# Patient Record
Sex: Female | Born: 1980 | Race: Black or African American | Hispanic: No | Marital: Single | Smoking: Current every day smoker
Health system: Southern US, Community
[De-identification: ages and names within clinical notes are randomized; demographics above are authoritative.]

## PROBLEM LIST (undated history)

## (undated) DIAGNOSIS — K745 Biliary cirrhosis, unspecified: Secondary | ICD-10-CM

## (undated) DIAGNOSIS — L0291 Cutaneous abscess, unspecified: Secondary | ICD-10-CM

## (undated) DIAGNOSIS — F32A Depression, unspecified: Secondary | ICD-10-CM

## (undated) DIAGNOSIS — F329 Major depressive disorder, single episode, unspecified: Secondary | ICD-10-CM

## (undated) DIAGNOSIS — F419 Anxiety disorder, unspecified: Secondary | ICD-10-CM

## (undated) DIAGNOSIS — K7460 Unspecified cirrhosis of liver: Secondary | ICD-10-CM

## (undated) HISTORY — DX: Cutaneous abscess, unspecified: L02.91

## (undated) HISTORY — PX: TUBAL LIGATION: SHX77

## (undated) HISTORY — DX: Unspecified cirrhosis of liver: K74.60

---

## 2001-03-03 ENCOUNTER — Inpatient Hospital Stay (HOSPITAL_COMMUNITY): Admission: RE | Admit: 2001-03-03 | Discharge: 2001-03-06 | Payer: Self-pay | Admitting: Obstetrics and Gynecology

## 2001-04-14 ENCOUNTER — Observation Stay (HOSPITAL_COMMUNITY): Admission: EM | Admit: 2001-04-14 | Discharge: 2001-04-15 | Payer: Self-pay | Admitting: Internal Medicine

## 2001-04-16 ENCOUNTER — Encounter (HOSPITAL_COMMUNITY): Admission: RE | Admit: 2001-04-16 | Discharge: 2001-05-16 | Payer: Self-pay | Admitting: General Surgery

## 2001-07-18 ENCOUNTER — Emergency Department (HOSPITAL_COMMUNITY): Admission: EM | Admit: 2001-07-18 | Discharge: 2001-07-18 | Payer: Self-pay | Admitting: *Deleted

## 2002-09-06 ENCOUNTER — Emergency Department (HOSPITAL_COMMUNITY): Admission: EM | Admit: 2002-09-06 | Discharge: 2002-09-06 | Payer: Self-pay | Admitting: Emergency Medicine

## 2003-05-29 ENCOUNTER — Ambulatory Visit (HOSPITAL_COMMUNITY): Admission: RE | Admit: 2003-05-29 | Discharge: 2003-05-29 | Payer: Self-pay | Admitting: *Deleted

## 2003-09-27 ENCOUNTER — Other Ambulatory Visit: Admission: RE | Admit: 2003-09-27 | Discharge: 2003-09-27 | Payer: Self-pay | Admitting: *Deleted

## 2004-01-19 ENCOUNTER — Emergency Department (HOSPITAL_COMMUNITY): Admission: EM | Admit: 2004-01-19 | Discharge: 2004-01-19 | Payer: Self-pay | Admitting: Emergency Medicine

## 2004-02-29 ENCOUNTER — Encounter (HOSPITAL_COMMUNITY): Admission: RE | Admit: 2004-02-29 | Discharge: 2004-03-30 | Payer: Self-pay | Admitting: Family Medicine

## 2005-01-09 ENCOUNTER — Emergency Department (HOSPITAL_COMMUNITY): Admission: EM | Admit: 2005-01-09 | Discharge: 2005-01-09 | Payer: Self-pay | Admitting: Emergency Medicine

## 2005-02-05 ENCOUNTER — Emergency Department (HOSPITAL_COMMUNITY): Admission: EM | Admit: 2005-02-05 | Discharge: 2005-02-05 | Payer: Self-pay | Admitting: Emergency Medicine

## 2005-09-12 ENCOUNTER — Emergency Department (HOSPITAL_COMMUNITY): Admission: EM | Admit: 2005-09-12 | Discharge: 2005-09-12 | Payer: Self-pay | Admitting: Emergency Medicine

## 2005-12-01 ENCOUNTER — Emergency Department (HOSPITAL_COMMUNITY): Admission: EM | Admit: 2005-12-01 | Discharge: 2005-12-01 | Payer: Self-pay | Admitting: Emergency Medicine

## 2005-12-06 ENCOUNTER — Emergency Department (HOSPITAL_COMMUNITY): Admission: EM | Admit: 2005-12-06 | Discharge: 2005-12-06 | Payer: Self-pay | Admitting: Emergency Medicine

## 2005-12-08 ENCOUNTER — Observation Stay (HOSPITAL_COMMUNITY): Admission: RE | Admit: 2005-12-08 | Discharge: 2005-12-09 | Payer: Self-pay | Admitting: General Surgery

## 2005-12-08 ENCOUNTER — Encounter (INDEPENDENT_AMBULATORY_CARE_PROVIDER_SITE_OTHER): Payer: Self-pay | Admitting: General Surgery

## 2006-04-29 ENCOUNTER — Emergency Department (HOSPITAL_COMMUNITY): Admission: EM | Admit: 2006-04-29 | Discharge: 2006-04-30 | Payer: Self-pay | Admitting: Emergency Medicine

## 2006-05-09 ENCOUNTER — Emergency Department (HOSPITAL_COMMUNITY): Admission: EM | Admit: 2006-05-09 | Discharge: 2006-05-09 | Payer: Self-pay | Admitting: Emergency Medicine

## 2006-08-02 ENCOUNTER — Emergency Department (HOSPITAL_COMMUNITY): Admission: EM | Admit: 2006-08-02 | Discharge: 2006-08-02 | Payer: Self-pay | Admitting: *Deleted

## 2009-05-01 ENCOUNTER — Emergency Department (HOSPITAL_COMMUNITY): Admission: EM | Admit: 2009-05-01 | Discharge: 2009-05-01 | Payer: Self-pay | Admitting: Emergency Medicine

## 2009-08-03 ENCOUNTER — Emergency Department (HOSPITAL_COMMUNITY): Admission: EM | Admit: 2009-08-03 | Discharge: 2009-08-03 | Payer: Self-pay | Admitting: Emergency Medicine

## 2009-08-06 ENCOUNTER — Emergency Department (HOSPITAL_COMMUNITY): Admission: EM | Admit: 2009-08-06 | Discharge: 2009-08-06 | Payer: Self-pay | Admitting: Emergency Medicine

## 2010-11-23 ENCOUNTER — Emergency Department (HOSPITAL_COMMUNITY)
Admission: EM | Admit: 2010-11-23 | Discharge: 2010-11-23 | Payer: Self-pay | Source: Home / Self Care | Admitting: Emergency Medicine

## 2011-04-03 NOTE — Op Note (Signed)
NAMENABRIA, NEVIN               ACCOUNT NO.:  [id number]   MEDICAL RECORD NO.:  [id number]          PATIENT TYPE:  OBV   LOCATION:  A322                          FACILITY:  [facility]   PHYSICIAN:  Jerolyn Shin C. Katrinka Blazing, M.D.   DATE OF BIRTH:  [birth year]   DATE OF PROCEDURE:  12/08/2005  DATE OF DISCHARGE:                                 OPERATIVE REPORT   PREOPERATIVE DIAGNOSIS:  Pilonidal abscess.   POSTOPERATIVE DIAGNOSIS:  Pilonidal abscess.   PROCEDURE:  Wide excision of pilonidal abscess with debridement.   SURGEON:  Dirk Dress. Katrinka Blazing, M.D.   DESCRIPTION OF PROCEDURE:  Under spinal anesthesia the patient was turned to  the prone position. The presacral area and lower back were prepped and  draped in a sterile field.  The large abscess cavity in the upper gluteal  crease was identified.  A wide excision around the abscess cavity was  carried out.  Even with wide excision we were still unable to get all the  way around the cavity.  Cultures of the purulent material in the cavity were  taken. The skin and subcutaneous tissue was excised.  All the necrotic  debris was excised back to normal appearing tissue. Hemostasis was achieved.  The area was packed with iodoform gauze.  Sterile dressing was placed.  The  patient tolerated the procedure well.  She was turned to a stretcher and  taken to the postanesthetic care unit in satisfactory condition.      Dirk Dress. Katrinka Blazing, M.D.  Electronically Signed     LCS/MEDQ  D:  12/08/2005  T:  12/09/2005  Job:  [id number]

## 2011-04-03 NOTE — H&P (Signed)
Mallory Mendoza, Mallory Mendoza               ACCOUNT NO.:  [id number]   MEDICAL RECORD NO.:  [id number]          PATIENT TYPE:  AMB   LOCATION:  DAY                           FACILITY:  [facility]   PHYSICIAN:  Leroy C. Katrinka Blazing, M.D.   DATE OF BIRTH:  1981/04/22   DATE OF ADMISSION:  DATE OF DISCHARGE:  LH                                HISTORY & PHYSICAL   A 30 year old female with a history of enlarged inflammatory mass of the  left buttocks near the central crease in the presacral region, suspicious  for large pilonidal abscess.  She has been on antibiotics for 10 days  without improvement.  She is admitted through day surgery for wide excision  of the area.   PAST MEDICAL HISTORY:  She has no other medical problems.  She did have I&D  of pilonidal abscess in the past.  Other surgery includes C section x3 and  tubal ligation.   ALLERGIES:  None.   PRESENT MEDICATIONS:  Septra DS twice daily.   PHYSICAL EXAMINATION:  VITAL SIGNS:  Blood pressure 122/84, pulse 88,  respirations 20.  Weight 164 pounds.  HEENT:  Unremarkable.  NECK:  Supple with no JVD, bruit, adenopathy, or thyromegaly.  CHEST: Clear except for a few scattered rhonchi. No wheezes, no rales.  HEART:  Regular rate and rhythm without murmur, gallop, or rub.  ABDOMEN:  Soft, nontender.  No masses.  RECTAL:  Reveals a large inflammatory mass of the left buttocks near the  central crease in the presacral region with extension across the midline and  superiorly.  There is inflammation around the indurated area.  It does not  extend to the posterior rectal space.  EXTREMITIES:  No cyanosis, clubbing, or edema.  NEUROLOGIC:  No focal deficits.   IMPRESSION:  Inflammatory mass of presacral area, suspected pilonidal  abscess.   PLAN:  Excision under anesthesia.      Dirk Dress. Katrinka Blazing, M.D.  Electronically Signed     LCS/MEDQ  D:  12/07/2005  T:  12/07/2005  Job:  [id number]

## 2011-04-03 NOTE — Op Note (Signed)
Mallory Mendoza, Mallory Mendoza                         ACCOUNT NO.:  [id number]   MEDICAL RECORD NO.:  [id number]                   PATIENT TYPE:  AMB   LOCATION:  DAY                                  FACILITY:  [facility]   PHYSICIAN:  Langley Gauss, M.D.                DATE OF BIRTH:  1981-04-17   DATE OF PROCEDURE:  05/29/2003  DATE OF DISCHARGE:                                 OPERATIVE REPORT   PREOPERATIVE DIAGNOSIS:  Multiparity, desires permanent sterilization.   POSTOPERATIVE DIAGNOSIS:  Multiparity, desires permanent sterilization.   PROCEDURE:  Laparoscopic tubal ligation utilizing bipolar cautery.   SURGEON:  Roylene Reason. Lisette Grinder, M.D.   SPECIMENS:  None.   ANESTHESIA:  General endotracheal   FINDINGS:  Findings at the time of surgery include a normal size uterus.  Ovaries noted to be normal in appearance.  Tubes noted to be normal in  appearance. There was noted to be a dense band of adhesions from the  midportion of the uterus anteriorly to the anterior abdominal wall.   SUMMARY:  The patient was taken to the operating room and vital signs were  stable.  The patient underwent uncomplicated induction of general  endotracheal anesthesia; then placed in the low lithotomy position and  prepped and draped in the usual sterile manner.  A speculum was placed  within the vaginal vault.  The cervix was visualized and noted to be without  lesions.  The anterior lip was grasped with a single-tooth tenaculum.  A  Hulka intrauterine manipulator was then used to pass through the  endocervical os and grasp the cervix at 12 o'clock.   Instruments are then removed.  I changed to sterile gloves; standing at the  patient's left side, a 1 cm vertical incision was made just inferior to the  umbilicus.  Likewise a suprapubic horizontal incision made under.  The  abdominal wall was then elevated.  The Veress needle was then passed through  the subumbilical incision angling towards the hollow of the  sacrum while  perpendicular to the fascial plane.  This was done atraumatically.  A drop  test then confirms a proper intraperitoneal placement.  Pneumoperitoneum is  then created by insufflating 3 L of CO2 gas at a filling pressure of less  than 10 mmHg.  The Veress needle is removed and the abdominal wall was then  elevated.  A 10-mm trocar, clear type, disposable is then inserted through  the subumbilical incision angling towards the hollow of the sacrum.  This  was done atraumatically.  The trocar was removed; scope was inserted; which  confirms a proper intraperitoneal placement with no apparent bowel or  vascular injury.   Under direct visualization a 5-mm trocar is inserted suprapubically, in the  midline, under direct visualization. This then allows pelvic contents to be  noted as previously described.  The uterus is then elevated.  Each of the  fallopian  tubes is verified by tracing them out to their fifth intercostal  space, midclavicular line ends.  Each of the fallopian tubes is then  cauterized as follows:  A total of 3 cauterizations are performed on each  side using the Kleppinger bipolar cauterization forceps with the most  proximal cauterization being 1 cm from the tubal uterine junction. Each of  the cauterizations is in direct continuity with the previous which results  in destruction of 3-4 cm of fallopian tube on each side.  There were no  bowel or vascular injuries occurring.   The uterus was then pushed posteriorly which allows visualization of the  adhesive band to the anterior abdominal wall.  We do not have any operative  supplies ready, at this time.  I did attempt to remove these adhesions with  the bipolar forceps, cauterizing and tearing; however, this was  unsuccessful.  Additional trocar sites as well as operative equipment would  be required to completely excise these incisions.  Thus, after the tubal  ligation is performed the procedure is terminated.   The instruments are  removed and gas is allowed to escape.   Our incision sites are closed utilizing a 2-0 Vicryl through-and-through  Scarpa's fascia followed by a 3-0 Nylon suture in a horizontal mattress  fashion to reapproximate the skin edges.  The patient tolerated the  procedure very well.  She is extubated and taken to the recovery room in  stable condition.  No family members are available to discuss operative  findings.  The patient states that they will be notified for pick up.                                                Langley Gauss, M.D.    DC/MEDQ  D:  05/29/2003  T:  05/29/2003  Job:  [id number] [id number]

## 2011-04-03 NOTE — Discharge Summary (Signed)
Mallory Mendoza, Mallory Mendoza               ACCOUNT NO.:  [id number]   MEDICAL RECORD NO.:  [id number]          PATIENT TYPE:  EMS   LOCATION:  ED                            FACILITY:  [facility]   PHYSICIAN:  Sheppard Penton. Stacie Acres, M.D.  DATE OF BIRTH:  1981/08/20   DATE OF ADMISSION:  08/02/2006  DATE OF DISCHARGE:  08/02/2006                                 DISCHARGE SUMMARY   CHIEF COMPLAINT:  Plantar wart.   HISTORY OF PRESENT ILLNESS:  This is a 30 year old black female who has had  a plantar wart on her foot for about a year causing increased pain.  She had  a similar wart previously, went to [facility] and had it removed.  She has no other complaints.   PAST MEDICAL HISTORY:  No known cardiopulmonary or gastrointestinal disease.   SOCIAL HISTORY:  Nonsmoker, no drug abuse.  Occasional drinker.   PAST SURGICAL HISTORY:  Bilateral tubal ligation.   FAMILY HISTORY:  Noncontributory.   REVIEW OF SYSTEMS:  Other than above also is negative.   PHYSICAL EXAMINATION:  VITAL SIGNS:  Reviewed.  GENERAL APPEARANCE:  File is reviewed and normal.  She is awake, alert,  cooperative in room 1.  Memory, affect, judgment appropriate for age.  FOCUS EXAMINATION:  Shows an approximately 2 cm plantar wart on the plantar  surface of her right foot.  Minimally tender to palpation.  No signs of  infection.  No symptoms in the arch.  No symptoms in the dorsum of the foot.  No symptoms in the leg.   Discussed with the patient that she could use over the counter Dr. Margart Sickles  plantar wart remedies and/or followup with [facility] for removal  of recurring wart.  She is agreeable to the plan.   DIAGNOSIS:  Recurrent plantar wart.   DISCHARGE INSTRUCTIONS:  Instructions given for followup.           ______________________________  Sheppard Penton. Stacie Acres, M.D.     NMM/MEDQ  D:  08/02/2006  T:  08/02/2006  Job:  [id number]

## 2011-04-03 NOTE — Discharge Summary (Signed)
   NAMEDEREK, HUNEYCUTT                         ACCOUNT NO.:  [id number]   MEDICAL RECORD NO.:  [id number]                   PATIENT TYPE:  AMB   LOCATION:  DAY                                  FACILITY:  [facility]   PHYSICIAN:  Langley Gauss, M.D.                DATE OF BIRTH:  [birth year]   DATE OF ADMISSION:  05/29/2003  DATE OF DISCHARGE:                                 DISCHARGE SUMMARY   The patient is to be discharged to home on the same day of service.  He was  given a copy of the standardized discharge instructions.   DISCHARGE MEDICATIONS:  The patient has previously been prescribed Lortab  10/500, #20, for postoperative pain relief.   Permanent laboratory studies hCG is negative.  Hemoglobin is 14.5,  hematocrit 42.4 with a white count of 7.5.   HOSPITAL COURSE:  See previous dictations.  The patient had a laparoscopic  tubal ligation using bipolar cautery performed, without complications,  through the ambulatory surgical unit.  Postoperatively the patient did well.  She had no postoperative complications and was able to ambulate and void.  Vital signs remained stable. She had adequate pain relief, thus discharged  to home on date of service.   DISPOSITION:  Follow up in the office in 1 week's time at which time the  sutures will require removal.                                               Langley Gauss, M.D.    DC/MEDQ  D:  05/29/2003  T:  05/29/2003  Job:  [id number]

## 2011-04-03 NOTE — H&P (Signed)
   NAME:  Mallory Mendoza, Mallory Mendoza                         ACCOUNT NO.:  [id number]   MEDICAL RECORD NO.:  [id number]                   PATIENT TYPE:  AMB   LOCATION:  DAY                                  FACILITY:  [facility]   PHYSICIAN:  Langley Gauss, M.D.                DATE OF BIRTH:  1981-03-04   DATE OF ADMISSION:  05/29/2003  DATE OF DISCHARGE:                                HISTORY & PHYSICAL   ADMISSION HISTORY AND PHYSICAL:  This is a 30 year old gravida 3, para 3  with three prior low-transverse cesarean sections and one set of twins  giving her four living babies who at this time desires permanent  sterilization.  The patient accepts the inherent 2% failure rate associated  with the procedure and she understands that it is to be considered permanent  and irreversible.  She, in addition, is aware that there are other  reversible forms of birth control available; but, she desires permanent  sterilization at this time.   PAST MEDICAL HISTORY:  She currently is amenorrheic secondary to Depo-  Provera.  Patient does not desire to continue Depo-Provera to waking and  bloating associated with it's use.   PAST SURGICAL HISTORY:  She has three prior low-transverse cesarean sections  without complications.   ALLERGIES:  She has no known drug allergies.   CURRENT MEDICATIONS:  None.   PHYSICAL EXAMINATION:  GENERAL:  A pleasant black female, height 5 foot 3  inches, weight 150.  VITAL SIGNS:  Blood pressure 119/75, pulse 91, respiratory rate is 20.  HEENT:  Negative.  No adenopathy.  Mucous membranes are moist.  NECK:  Supple.  Thyroid is not palpable.  LUNGS:  Clear.  CARDIOVASCULAR:  Regular, rate and rhythm.  ABDOMEN:  Soft and nontender.  No scars are identified other then the  Pfannenstiel incision.  No abdominal or pelvic masses appreciated.  EXTREMITIES:  Noted to be normal.  PELVIC:  Normal external genitalia.  No lesions or ulcerations identified.  Cervix noted to be without  lesions.  Uterus normal size shape and  consistency.  No adnexal masses.  No pelvic tenderness appreciated.  The  bladder and rectum are both well supported.   ASSESSMENT:  A 30 year old patient fully counseled regarding risks and  benefits of the surgical procedure.  She is prepared to proceed with  permanent sterilization at this time.  She has appropriately signed papers  30 days in advance for performance of the procedure.                                               Langley Gauss, M.D.   DC/MEDQ  D:  05/29/2003  T:  05/29/2003  Job:  [id number]

## 2012-08-06 ENCOUNTER — Emergency Department (HOSPITAL_COMMUNITY)
Admission: EM | Admit: 2012-08-06 | Discharge: 2012-08-06 | Disposition: A | Discharge status: Home / Self Care | Payer: Medicaid Other | Attending: Emergency Medicine | Admitting: Emergency Medicine

## 2012-08-06 ENCOUNTER — Encounter (HOSPITAL_COMMUNITY): Payer: Self-pay | Admitting: Emergency Medicine

## 2012-08-06 DIAGNOSIS — Z23 Encounter for immunization: Secondary | ICD-10-CM | POA: Insufficient documentation

## 2012-08-06 DIAGNOSIS — T310 Burns involving less than 10% of body surface: Secondary | ICD-10-CM | POA: Insufficient documentation

## 2012-08-06 DIAGNOSIS — X12XXXA Contact with other hot fluids, initial encounter: Secondary | ICD-10-CM | POA: Insufficient documentation

## 2012-08-06 DIAGNOSIS — T23209A Burn of second degree of unspecified hand, unspecified site, initial encounter: Secondary | ICD-10-CM | POA: Insufficient documentation

## 2012-08-06 DIAGNOSIS — X131XXA Other contact with steam and other hot vapors, initial encounter: Secondary | ICD-10-CM | POA: Insufficient documentation

## 2012-08-06 DIAGNOSIS — Y93G3 Activity, cooking and baking: Secondary | ICD-10-CM | POA: Insufficient documentation

## 2012-08-06 DIAGNOSIS — T23009A Burn of unspecified degree of unspecified hand, unspecified site, initial encounter: Secondary | ICD-10-CM

## 2012-08-06 HISTORY — DX: Depression, unspecified: F32.A

## 2012-08-06 HISTORY — DX: Anxiety disorder, unspecified: F41.9

## 2012-08-06 HISTORY — DX: Major depressive disorder, single episode, unspecified: F32.9

## 2012-08-06 MED ORDER — OXYCODONE-ACETAMINOPHEN 5-325 MG PO TABS: 2.0000 | ORAL_TABLET | ORAL | Status: DC | PRN

## 2012-08-06 MED ORDER — OXYCODONE-ACETAMINOPHEN 5-325 MG PO TABS
1.0000 | ORAL_TABLET | Freq: Once | ORAL | Status: AC
Start: 2012-08-06 — End: 2012-08-06
  Administered 2012-08-06: 1 via ORAL
  Filled 2012-08-06: qty 1

## 2012-08-06 MED ORDER — SILVER SULFADIAZINE 1 % EX CREA: TOPICAL_CREAM | Freq: Every day | CUTANEOUS | Status: DC

## 2012-08-06 MED ORDER — TETANUS-DIPHTH-ACELL PERTUSSIS 5-2.5-18.5 LF-MCG/0.5 IM SUSP
0.5000 mL | Freq: Once | INTRAMUSCULAR | Status: AC
  Administered 2012-08-06: 0.5 mL via INTRAMUSCULAR
  Filled 2012-08-06: qty 0.5
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
## 2012-12-02 ENCOUNTER — Encounter (HOSPITAL_COMMUNITY): Payer: Self-pay | Admitting: *Deleted

## 2012-12-02 ENCOUNTER — Emergency Department (HOSPITAL_COMMUNITY)
Admission: EM | Admit: 2012-12-02 | Discharge: 2012-12-02 | Disposition: A | Discharge status: Home / Self Care | Payer: Medicaid Other | Attending: Emergency Medicine | Admitting: Emergency Medicine

## 2012-12-02 ENCOUNTER — Emergency Department (HOSPITAL_COMMUNITY): Payer: Medicaid Other

## 2012-12-02 DIAGNOSIS — Y929 Unspecified place or not applicable: Secondary | ICD-10-CM | POA: Insufficient documentation

## 2012-12-02 DIAGNOSIS — Z79899 Other long term (current) drug therapy: Secondary | ICD-10-CM | POA: Insufficient documentation

## 2012-12-02 DIAGNOSIS — F17200 Nicotine dependence, unspecified, uncomplicated: Secondary | ICD-10-CM | POA: Insufficient documentation

## 2012-12-02 DIAGNOSIS — Y939 Activity, unspecified: Secondary | ICD-10-CM | POA: Insufficient documentation

## 2012-12-02 DIAGNOSIS — F329 Major depressive disorder, single episode, unspecified: Secondary | ICD-10-CM | POA: Insufficient documentation

## 2012-12-02 DIAGNOSIS — F3289 Other specified depressive episodes: Secondary | ICD-10-CM | POA: Insufficient documentation

## 2012-12-02 DIAGNOSIS — S82843A Displaced bimalleolar fracture of unspecified lower leg, initial encounter for closed fracture: Secondary | ICD-10-CM | POA: Insufficient documentation

## 2012-12-02 DIAGNOSIS — X500XXA Overexertion from strenuous movement or load, initial encounter: Secondary | ICD-10-CM | POA: Insufficient documentation

## 2012-12-02 DIAGNOSIS — F411 Generalized anxiety disorder: Secondary | ICD-10-CM | POA: Insufficient documentation

## 2012-12-02 DIAGNOSIS — S82202A Unspecified fracture of shaft of left tibia, initial encounter for closed fracture: Secondary | ICD-10-CM

## 2012-12-02 DIAGNOSIS — S82402A Unspecified fracture of shaft of left fibula, initial encounter for closed fracture: Secondary | ICD-10-CM

## 2012-12-02 MED ORDER — NAPROXEN 500 MG PO TABS: 500.0000 mg | ORAL_TABLET | Freq: Two times a day (BID) | ORAL | Status: DC

## 2012-12-02 MED ORDER — OXYCODONE-ACETAMINOPHEN 5-325 MG PO TABS
2.0000 | ORAL_TABLET | Freq: Once | ORAL | Status: AC
  Administered 2012-12-02: 2 via ORAL
  Filled 2012-12-02: qty 2

## 2012-12-02 MED ORDER — OXYCODONE-ACETAMINOPHEN 5-325 MG PO TABS: 1.0000 | ORAL_TABLET | ORAL | Status: DC | PRN

## 2012-12-02 NOTE — ED Provider Notes (Signed)
History     CSN: [id number]  Arrival date & time 12/02/12  0016   First MD Initiated Contact with Patient 12/02/12 [phone]      Chief Complaint  Patient presents with  . Ankle Pain    (Consider location/radiation/quality/duration/timing/severity/associated sxs/prior treatment) HPI Comments: 32 year old female with no significant past medical history who presents with a complaint of left ankle pain after she slipped and fell twisting her ankle. This occurred acute in onset just prior to arrival, pain is persistent, severe and associated with swelling of the ankle. She has no numbness or weakness or tingling of her toes. He has no history of fractures and has had no pain medication prior to arrival. The patient has had an alcoholic drink this evening but denies any chest pain, shortness of breath, headache, abdominal pain, back pain, neck pain, dysuria or diarrhea.  Patient is a 32 y.o. female presenting with ankle pain. The history is provided by the patient.  Ankle Pain  Pertinent negatives include no numbness.    Past Medical History  Diagnosis Date  . Depression   . Anxiety     Past Surgical History  Procedure Date  . Cesarean section   . Tubal ligation     No family history on file.  History  Substance Use Topics  . Smoking status: Current Every Day Smoker -- 1.0 packs/day    Types: Cigarettes  . Smokeless tobacco: Not on file  . Alcohol Use: Yes     Comment: occ    OB History    Grav Para Term Preterm Abortions TAB SAB Ect Mult Living                  Review of Systems  Musculoskeletal: Positive for joint swelling.  Skin: Negative for wound.  Neurological: Negative for weakness and numbness.  All other systems reviewed and are negative.    Allergies  Review of patient's allergies indicates no known allergies.  Home Medications   Current Outpatient Rx  Name  Route  Sig  Dispense  Refill  . FLUOXETINE HCL 20 MG PO CAPS   Oral   Take 20 mg by mouth  daily.         Marland Kitchen ALPRAZOLAM 2 MG PO TABS   Oral   Take 2 mg by mouth at bedtime as needed. For sleep         . NAPROXEN 500 MG PO TABS   Oral   Take 1 tablet (500 mg total) by mouth 2 (two) times daily with a meal.   30 tablet   0   . OXYCODONE-ACETAMINOPHEN 5-325 MG PO TABS   Oral   Take 1 tablet by mouth every 4 (four) hours as needed for pain.   20 tablet   0   . OXYCODONE-ACETAMINOPHEN 5-325 MG PO TABS   Oral   Take 2 tablets by mouth every 4 (four) hours as needed for pain.   15 tablet   0   . SILVER SULFADIAZINE 1 % EX CREA   Topical   Apply topically daily.   50 g   0     BP 99/77  Pulse 87  Temp 98.1 F (36.7 C) (Oral)  Resp 20  Ht 5\' 6"  (1.676 m)  Wt 170 lb (77.111 kg)  BMI 27.44 kg/m2  SpO2 100%  LMP 11/17/2012  Physical Exam  Nursing note and vitals reviewed. Constitutional: She appears well-developed and well-nourished. No distress.  HENT:  Head: Normocephalic and atraumatic.  Mouth/Throat: Oropharynx is clear and moist. No oropharyngeal exudate.  Eyes: Conjunctivae normal and EOM are normal. Pupils are equal, round, and reactive to light. Right eye exhibits no discharge. Left eye exhibits no discharge. No scleral icterus.  Neck: Normal range of motion. Neck supple. No JVD present. No thyromegaly present.  Cardiovascular: Normal rate, regular rhythm, normal heart sounds and intact distal pulses.  Exam reveals no gallop and no friction rub.   No murmur heard. Pulmonary/Chest: Effort normal and breath sounds normal. No respiratory distress. She has no wheezes. She has no rales.  Musculoskeletal: She exhibits tenderness. She exhibits no edema.       Tenderness and swelling to the left ankle, normal alignment of the joint  Lymphadenopathy:    She has no cervical adenopathy.  Neurological: She is alert. Coordination normal.       Normal sensation and motor to the left foot  Skin: Skin is warm and dry. No rash noted. No erythema.       No open  wounds at the left ankle  Psychiatric: She has a normal mood and affect. Her behavior is normal.    ED Course  Procedures (including critical care time)  Labs Reviewed - No data to display Dg Ankle Complete Left  12/02/2012  *RADIOLOGY REPORT*  Clinical Data: Left ankle injury with pain.  LEFT ANKLE COMPLETE - 3+ VIEW  Comparison: None.  Findings: Comminuted spiral fracture of the distal left fibular shaft.  Transverse fracture of the medial malleolus.  Lateral displacement of fracture fragments and of the talus with respect to the tibia.  Posterior malleolus appears to be intact.  IMPRESSION: Fractures of the distal left fibula and medial malleolus with lateral displacement.   Original Report Authenticated By: Burman Nieves, M.D.      1. Fracture of left fibula   2. Fracture of left tibia       MDM  The patient has a closed distal lower extremity fracture involving the lateral malleolus and the medial malleolus. There is a very small amount of displacement, the patient will be placed in a splint, crutches, nonweightbearing and to followup with orthopedic surgery. I have discussed her care with Dr. Romeo Apple of orthopedics who agrees with the plan. The patient appears stable, no other injuries, Percocet given prior to discharge. The patient has been given an ice pack and elevate the extremity in the emergency department.   Discharge Prescriptions include:  Naprosyn  Percocet  Splint and crutches, nonweightbearing     Vida Roller, MD 12/02/12 [phone]

## 2012-12-02 NOTE — ED Notes (Signed)
Pt states twisted her left ankle about 15 minutes ago.

## 2012-12-05 NOTE — ED Notes (Signed)
A message was left with me to call Dr. Mort Sawyers office to try to schedule an appt for the Pt, because when she called them, she said "they told her it may be 2 wks before they could get her in".  Spoke with Dr. Mort Sawyers office on today at [phone] about an appt for Pt. Sect. Says that Dr. Romeo Apple will be out of the office until Thursday and is only scheduled to work Thursday morning. and their Thurday morning is quite booked.But they will check with him to see if Pt may be able to come on Thursday evening or Monday morning. The office will call the Pt and let her know something.

## 2012-12-05 NOTE — ED Notes (Signed)
Pt unable to be seen by Dr. Romeo Apple until Monday January 27th due to him being out of town. Dr.Harrison office called patient back and told patient to call EDP to request more pain medication until she can make it to her appt. Pauline Aus, PA wrote prescription for 20 tablets of percocet. Pt informed that she would have to use pain meds accordingly and to take motrin for pain as well.

## 2012-12-12 ENCOUNTER — Encounter: Payer: Self-pay | Admitting: Orthopedic Surgery

## 2012-12-12 ENCOUNTER — Ambulatory Visit (INDEPENDENT_AMBULATORY_CARE_PROVIDER_SITE_OTHER): Payer: Medicaid Other | Admitting: Orthopedic Surgery

## 2012-12-12 VITALS — BP 104/68 | Ht 65.0 in | Wt 172.0 lb

## 2012-12-12 DIAGNOSIS — S82843A Displaced bimalleolar fracture of unspecified lower leg, initial encounter for closed fracture: Secondary | ICD-10-CM

## 2012-12-12 MED ORDER — OXYCODONE-ACETAMINOPHEN 5-325 MG PO TABS: 1.0000 | ORAL_TABLET | ORAL | Status: DC | PRN

## 2012-12-12 NOTE — Patient Instructions (Addendum)
Surgery left ankle Friday 12-16-2012  Bimalleolar Fracture, Ankle, Adult, Displaced (ORIF) A bimalleolar fracture (break in bone) is two fractures in the lower bones of your leg that help to make up your ankle. These fractures are in the bone you feel as the bump on the outside of your ankle (fibula) and the bone that you feel as the bump on the inside of your ankle (tibia). Your fractures are displaced. This means the bones are not in their normal position and will not give a good result if they heal in that position. Because of this, surgery is required. This is called an open reduction and internal fixation (ORIF). Even with the best of care and perfect results this ankle may be more prone to be arthritis later due to damage of the cartilage lining the ankle joint which is not visible on X-ray. These fractures are easily diagnosed with X-rays. TREATMENT   You have fractures that would probably heal with disability, without surgery. Open reduction means that the area of the fracture is opened up to the vision of the surgeon and internal fixation means that a screw, pins or fixation device is used to hold the boney pieces in place. Following surgery a short-leg cast or removable fracture boot is then applied from your toes to below your knee. This is generally left in place for about 5 to 6 weeks, during which time it is followed by your caregiver and X-rays may be taken to make sure the bones stay in place. RISKS & COMPLICATIONS: All surgery is associated with risks. Some of these risks are:  Excessive bleeding   Infection   Failure to heal properly resulting in an unstable or arthritic ankle   Stiffness of ankle following repair  LET YOUR CAREGIVERS KNOW ABOUT:  Allergies.   Medications taken including herbs, eye drops, over-the-counter medications, and creams.   Use of steroids (by mouth or creams).   History of bleeding or blood problems.   Previous problems with anesthetics or numbing  medication, including a family history of these problems.   Possibility of pregnancy, if this applies.   History of blood clots (thrombophlebitis).   Previous surgery.   Other health problems.  BEFORE AND AFTER YOUR SURGERY Prior to surgery an IV (intravenous line connected to your vein for giving fluids) may be started and you will be given an anesthetic (medications and gas to make you sleep). You may also be given a regional anesthetic such as a spinal or epidural block. After surgery, you will be taken to the recovery area where a nurse will monitor your progress. You may have a catheter (a long, narrow, hollow tube) in your bladder following surgery that helps you pass your water. When you are awake, are stable, taking fluids well and without complications, you will be returned to your room. You will receive physical therapy and other care until you are doing well and your caregiver feels it is safe for you to be transferred either to home or to an extended care facility. HOME CARE INSTRUCTIONS    You may resume normal diet and activities as directed or allowed.   Do not drive a vehicle until your caregiver specifically tells you it is safe to do so.   Keep ice packs (a bag of ice wrapped in a towel) on the surgical area for 20 minutes, 4 times per day, for the first two days following surgery. Use the ice only if okay with your surgeon or caregiver.  Elevate your ankle above your heart as much as possible for the first 24 to 48 hours after the operation.   Change dressings if necessary or as directed.   If you have a plaster or fiberglass cast:   Do not try to scratch the skin under the cast using sharp or pointed objects.   Check the skin around the cast every day. You may put lotion on any red or sore areas.   Keep your cast dry and clean.   Do not put pressure on any part of your cast or splint until it is fully hardened.   Your cast or splint can be protected during  bathing with a plastic bag. Do not lower the cast or splint into water.   Only take over-the-counter or prescription medicines for pain, discomfort, or fever as directed by your caregiver.   Use crutches as directed and do not exercise leg unless instructed.   These are not fractures to be taken lightly! If these bones become displaced and get out of position, it may eventually lead to arthritis and disability for the rest of your life. Problems often follow even the best of care. Follow the directions of your caregiver.   Keep appointments as directed.  SEEK IMMEDIATE MEDICAL CARE IF:    Redness, swelling, numbness, or increasing pain in the wound.   Pus coming from wound.   An unexplained oral temperature above 102 F (38.9 C) or as your caregiver suggests.   A bad smell coming from the wound or dressing.   A breaking open of the wound (edges not staying together) after sutures or staples have been removed.   Your skin or nails below the injury turn blue or gray, or feel cold or numb.   You develop severe pain under the cast or in your foot. Especially when someone else moves your toes.  Follow all instructions given to you by your caregiver, make and keep follow up appointments, and use crutches as directed. If you do not have a window in your cast for observing the wound, a discharge, or minor bleeding may show up as a stain on the outside of your dressings, your cast, or plaster splint. Report these findings to your caregiver. MAKE SURE YOU:    Understand these instructions.   Will watch your condition.   Will get help right away if you are not doing well or get worse.  Document Released: 08/12/2005 Document Revised: 01/25/2012 Document Reviewed: 06/06/2008 [facility] Patient Information 2013 [place], [place].

## 2012-12-13 ENCOUNTER — Encounter: Payer: Self-pay | Admitting: Orthopedic Surgery

## 2012-12-13 ENCOUNTER — Encounter (HOSPITAL_COMMUNITY): Payer: Self-pay | Admitting: Pharmacy Technician

## 2012-12-13 DIAGNOSIS — S82843A Displaced bimalleolar fracture of unspecified lower leg, initial encounter for closed fracture: Secondary | ICD-10-CM | POA: Insufficient documentation

## 2012-12-13 NOTE — Patient Instructions (Addendum)
Your procedure is scheduled on: 1//31/2014  Report to [facility] at   6:15  AM.  Call this number if you have problems the morning of surgery: [phone]   Remember:   Do not drink or eat food:After Midnight.  :  Take these medicines the morning of surgery with A SIP OF WATER:    Do not wear jewelry, make-up or nail polish.  Do not wear lotions, powders, or perfumes. You may wear deodorant.  Do not shave 48 hours prior to surgery. Men may shave face and neck.  Do not bring valuables to the hospital.  Contacts, dentures or bridgework may not be worn into surgery.  Leave suitcase in the car. After surgery it may be brought to your room.  For patients admitted to the hospital, checkout time is 11:00 AM the day of discharge.   Patients discharged the day of surgery will not be allowed to drive home.    Special Instructions: Shower using CHG 2 nights before surgery and the night before surgery.  If you shower the day of surgery use CHG.  Use special wash - you have one bottle of CHG for all showers.  You should use approximately 1/3 of the bottle for each shower.   Please read over the following fact sheets that you were given: Pain Booklet, MRSA Information, Surgical Site Infection Prevention and Care and Recovery After Surgery   Displaced Fibular Fracture (Adult, Ankle) Treated with ORIF You have a fracture (break) of your fibula at the end of this bone that makes up part of your ankle. This is the bone in your lower leg located on the outside of the leg and it makes up the bump you feel on the outside of your ankle. The fracture you have is displaced. This means the bones are not in good healing position. Your displaced fracture is at the part of the fibula that is located at the ankle. Because of this the bones must be put back into position surgically. This is called ORIF. ORIF stands for Open Reduction and Internal Fixation. This surgical repair of your ankle will give the best chance for  your ankle to heal right and decrease the chances of later arthritis and disability, which may occur with even the best of treatment and care. These fractures are easily diagnosed with x-rays. LET YOUR CAREGIVER KNOW ABOUT:  Allergies  Medications taken including herbs, eye drops, over the counter medications, and creams  Use of steroids (by mouth or creams)  History of bleeding or blood problems  Previous problems with anesthetics or novocaine  History of blood clots (thrombophlebitis)  Previous surgery.  Other health problems  Family history of anesthetic problems.  Possibility of pregnancy, if this applies RISKS AND COMPLICATIONS All surgery is associated with risks. Some of these risks are:  Excessive bleeding.  Infection.  Post traumatic arthritis.  Failure to heal properly resulting in an unstable ankle.  Stiffness of ankle following repair. BEFORE THE PROCEDURE Prior to surgery an IV (intravenous line connected to your vein for giving fluids) may be started and you will be given an anesthetic (medications and gas to make you sleep).  AFTER THE PROCEDURE This is usually an outpatient procedure. This means you will be released from the hospital the same day. You will receive physical therapy and other care until you are doing well. HOME CARE INSTRUCTIONS   You may resume normal diet and activities as directed or allowed.  Keep ice packs (a bag  of ice wrapped in a towel) on the surgical area for twenty minutes, four times per day, for the first two days following surgery.  Change dressings if necessary or as directed.  If you have a plaster or fiberglass cast:  Do not try to scratch the skin under the cast using sharp or pointed objects.  Check the skin around the cast every day. You may put lotion on any red or sore areas.  Keep your cast dry and clean.  Do not put pressure on any part of your cast or splint until it is fully hardened.  Your cast or splint  can be protected during bathing with a plastic bag. Do not lower the cast or splint into water.  Only take over-the-counter or prescription medicines for pain, discomfort, or fever as directed by your caregiver.  Use crutches as directed and do not exercise leg unless instructed.  Your caregiver may instruct you to remove your cam boot.  These are not fractures to be taken lightly! If these bones become displaced and get out of position, it may eventually lead to arthritis and disability. Problems often follow even the best of care. Follow the directions of your caregiver.  Keep appointments as directed.  Warning: Do not drive a car or operate a motor vehicle until your caregiver specifically tells you it is safe to do so. SEEK IMMEDIATE MEDICAL CARE IF:   Redness, swelling, or increasing pain in the wound.  Pus coming from wound.  An unexplained oral temperature above 102 F (38.9 C) develops.  A bad smell coming from the wound or dressing.  A breaking open of the wound (edges not staying together) after sutures or staples have been removed.  Numbness in the foot that is getting worse.  Severe pain when you move your toes. If you do not have a window in your cast for observing the wound, a discharge or minor bleeding may show up as a stain on the outside of your cast. Report these findings to your caregiver. MAKE SURE YOU:   Understand these instructions.  Will watch your condition.  Will get help right away if you are not doing well or get worse. Document Released: 11/02/2005 Document Revised: 01/25/2012 Document Reviewed: 02/07/2008 [facility] Patient Information 2013 [place], [place].

## 2012-12-13 NOTE — Progress Notes (Signed)
  Subjective:    Patient ID: Mallory Mendoza, female    DOB: 07-16-1981, 32 y.o.   MRN: [id number]  HPI Comments: 32 year old female relatively healthy fell and fractured her left ankle shows a bimalleolar ankle fracture. She complains of severe pain and inability to weight-bear  Ankle Pain  The incident occurred more than 1 week ago. The incident occurred at home. The injury mechanism was a fall. The pain is present in the left ankle. The quality of the pain is described as stabbing and aching. The pain is severe. The pain has been constant since onset. Associated symptoms include an inability to bear weight and a loss of motion. Pertinent negatives include no loss of sensation, muscle weakness, numbness or tingling.      Review of Systems  Neurological: Negative for tingling and numbness.       Objective:   Physical Exam  Nursing note and vitals reviewed. Constitutional: She is oriented to person, place, and time. She appears well-developed and well-nourished.  HENT:  Head: Normocephalic.  Eyes: Pupils are equal, round, and reactive to light.  Neck: Normal range of motion.  Cardiovascular: Normal rate and intact distal pulses.   Pulmonary/Chest: Effort normal.  Abdominal: Soft.  Musculoskeletal:       Left ankle: She exhibits decreased range of motion, swelling and ecchymosis. She exhibits no deformity, no laceration and normal pulse. tenderness. Lateral malleolus, medial malleolus, AITFL, CF ligament and posterior TFL tenderness found. No head of 5th metatarsal and no proximal fibula tenderness found. Achilles tendon normal. Achilles tendon exhibits no defect and normal Thompson's test results.       Upper extremity exam  The right and left upper extremity:  Inspection and palpation revealed no abnormalities in the upper extremities.  Range of motion is full without contracture. Motor exam is normal with grade 5 strength. The joints are fully reduced without subluxation. There  is no atrophy or tremor and muscle tone is normal.  All joints are stable.    The right lower extremity has normal range of motion, strength, stability and alignment without tenderness or deformity  Lymphadenopathy:    She has no cervical adenopathy.  Neurological: She is alert and oriented to person, place, and time. She has normal reflexes. She displays normal reflexes. She exhibits normal muscle tone. Coordination normal.  Skin: Skin is warm and dry. No rash noted. No erythema. No pallor.       The ankle does have severe ecchymosis, no blisters  Psychiatric: She has a normal mood and affect. Her behavior is normal. Judgment and thought content normal.          Assessment & Plan:  Rate grafts show a medial malleolus fracture displaced a comminuted splintered lateral malleolus fracture which is high making this a Weber C. fracture. Slight shift in the ankle mortise.  I have assessed this is an unstable fracture. She will do much better with surgical treatment.  I discussed the risks and benefits of procedure with her and she agrees that she needs surgery. Reviewed her x-rays with her as well.  The patient will have open treatment internal fixation of the left ankle with a Stryker ankle set  Informed consent has been obtained this consent will be signed at her preoperative visit

## 2012-12-14 ENCOUNTER — Encounter (HOSPITAL_COMMUNITY): Payer: Self-pay

## 2012-12-14 ENCOUNTER — Encounter (HOSPITAL_COMMUNITY)
Admission: RE | Admit: 2012-12-14 | Discharge: 2012-12-14 | Payer: Medicaid Other | Source: Ambulatory Visit | Attending: Orthopedic Surgery | Admitting: Orthopedic Surgery

## 2012-12-15 ENCOUNTER — Telehealth: Payer: Self-pay | Admitting: Orthopedic Surgery

## 2012-12-15 NOTE — H&P (Signed)
Subjective:   Patient ID: Mallory Mendoza, female DOB: [birth year], 32 y.o. MRN: [id number]  HPI Comments: 32 year old female relatively healthy fell and fractured her left ankle shows a bimalleolar ankle fracture. She complains of severe pain and inability to weight-bear  Ankle Pain  The incident occurred more than 1 week ago. The incident occurred at home. The injury mechanism was a fall. The pain is present in the left ankle. The quality of the pain is described as stabbing and aching. The pain is severe. The pain has been constant since onset. Associated symptoms include an inability to bear weight and a loss of motion. Pertinent negatives include no loss of sensation, muscle weakness, numbness or tingling.   Review of Systems  Neurological: Negative for tingling and numbness.   Past Medical History  Diagnosis Date  . Depression   . Anxiety   . Depression     Past Surgical History  Procedure Date  . Cesarean section   . Tubal ligation     History   Social History  . Marital Status: Single    Spouse Name: N/A    Number of Children: N/A  . Years of Education: N/A   Social History Main Topics  . Smoking status: Current Every Day Smoker -- 1.0 packs/day    Types: Cigarettes  . Smokeless tobacco: Not on file  . Alcohol Use: Yes     Comment: occ  . Drug Use: No  . Sexually Active:    Other Topics Concern  . Not on file   Social History Narrative  . No narrative on file   No family history on file. family history is not on file.   Objective:   Physical Exam  Nursing note and vitals reviewed.  Constitutional: She is oriented to person, place, and time. She appears well-developed and well-nourished.  HENT:  Head: Normocephalic.  Eyes: Pupils are equal, round, and reactive to light.  Neck: Normal range of motion.  Cardiovascular: Normal rate and intact distal pulses.  Pulmonary/Chest: Effort normal.  Abdominal: Soft.  Musculoskeletal:  Left ankle: She exhibits  decreased range of motion, swelling and ecchymosis. She exhibits no deformity, no laceration and normal pulse. tenderness. Lateral malleolus, medial malleolus, AITFL, CF ligament and posterior TFL tenderness found. No head of 5th metatarsal and no proximal fibula tenderness found. Achilles tendon normal. Achilles tendon exhibits no defect and normal Thompson's test results.  Upper extremity exam  The right and left upper extremity:  Inspection and palpation revealed no abnormalities in the upper extremities.  Range of motion is full without contracture. Motor exam is normal with grade 5 strength. The joints are fully reduced without subluxation. There is no atrophy or tremor and muscle tone is normal. All joints are stable.    The right lower extremity has normal range of motion, strength, stability and alignment without tenderness or deformity  Lymphadenopathy:  She has no cervical adenopathy.  Neurological: She is alert and oriented to person, place, and time. She has normal reflexes. She displays normal reflexes. She exhibits normal muscle tone. Coordination normal.  Skin: Skin is warm and dry. No rash noted. No erythema. No pallor.  The ankle does have severe ecchymosis, no blisters  Psychiatric: She has a normal mood and affect. Her behavior is normal. Judgment and thought content normal.    Assessment & Plan:   Rate grafts show a medial malleolus fracture displaced a comminuted splintered lateral malleolus fracture which is high making this a Weber C. fracture. Slight  shift in the ankle mortise.  I have assessed this is an unstable fracture. She will do much better with surgical treatment.  I discussed the risks and benefits of procedure with her and she agrees that she needs surgery. Reviewed her x-rays with her as well.  The patient will have open treatment internal fixation of the left ankle with a Stryker ankle set  Informed consent has been obtained this consent will be signed at  her preoperative visit

## 2012-12-15 NOTE — Telephone Encounter (Addendum)
Per online access, [employer], [employer], no pre-authorization is required for out-patient procedure, CPT [phone] / CPT 27226, scheduled 12/16/12, [facility].

## 2012-12-16 ENCOUNTER — Ambulatory Visit (HOSPITAL_COMMUNITY): Payer: Medicaid Other

## 2012-12-16 ENCOUNTER — Telehealth: Payer: Self-pay | Admitting: Orthopedic Surgery

## 2012-12-16 ENCOUNTER — Encounter (HOSPITAL_COMMUNITY): Payer: Self-pay | Admitting: Anesthesiology

## 2012-12-16 ENCOUNTER — Ambulatory Visit (HOSPITAL_COMMUNITY): Payer: Medicaid Other | Admitting: Anesthesiology

## 2012-12-16 ENCOUNTER — Ambulatory Visit (HOSPITAL_COMMUNITY)
Admission: RE | Admit: 2012-12-16 | Discharge: 2012-12-16 | Disposition: A | Discharge status: Home / Self Care | Payer: Medicaid Other | Source: Ambulatory Visit | Attending: Orthopedic Surgery | Admitting: Orthopedic Surgery

## 2012-12-16 ENCOUNTER — Other Ambulatory Visit: Payer: Self-pay | Admitting: Orthopedic Surgery

## 2012-12-16 ENCOUNTER — Encounter (HOSPITAL_COMMUNITY): Payer: Self-pay

## 2012-12-16 ENCOUNTER — Encounter (HOSPITAL_COMMUNITY)
Admission: RE | Disposition: A | Discharge status: Home / Self Care | Payer: Self-pay | Source: Ambulatory Visit | Attending: Orthopedic Surgery

## 2012-12-16 DIAGNOSIS — Y92009 Unspecified place in unspecified non-institutional (private) residence as the place of occurrence of the external cause: Secondary | ICD-10-CM | POA: Insufficient documentation

## 2012-12-16 DIAGNOSIS — S82843A Displaced bimalleolar fracture of unspecified lower leg, initial encounter for closed fracture: Secondary | ICD-10-CM

## 2012-12-16 DIAGNOSIS — W19XXXA Unspecified fall, initial encounter: Secondary | ICD-10-CM | POA: Insufficient documentation

## 2012-12-16 DIAGNOSIS — Z01812 Encounter for preprocedural laboratory examination: Secondary | ICD-10-CM | POA: Insufficient documentation

## 2012-12-16 HISTORY — PX: ORIF ANKLE FRACTURE: SHX5408

## 2012-12-16 LAB — SURGICAL PCR SCREEN
MRSA, PCR: NEGATIVE
Staphylococcus aureus: NEGATIVE

## 2012-12-16 LAB — HEMOGLOBIN AND HEMATOCRIT, BLOOD
HCT: 44.2 % (ref 36.0–46.0)
Hemoglobin: 14.9 g/dL (ref 12.0–15.0)

## 2012-12-16 SURGERY — OPEN REDUCTION INTERNAL FIXATION (ORIF) ANKLE FRACTURE
Anesthesia: General | Site: Ankle | Laterality: Left | Wound class: Clean

## 2012-12-16 MED ORDER — CEFAZOLIN SODIUM-DEXTROSE 2-3 GM-% IV SOLR
INTRAVENOUS | Status: AC
  Filled 2012-12-16: qty 50

## 2012-12-16 MED ORDER — ONDANSETRON HCL 4 MG/2ML IJ SOLN
INTRAMUSCULAR | Status: AC
  Filled 2012-12-16: qty 2

## 2012-12-16 MED ORDER — BUPIVACAINE-EPINEPHRINE PF 0.5-1:200000 % IJ SOLN
INTRAMUSCULAR | Status: DC | PRN
  Administered 2012-12-16 (×2): 30 mL

## 2012-12-16 MED ORDER — LIDOCAINE HCL 1 % IJ SOLN
INTRAMUSCULAR | Status: DC | PRN
  Administered 2012-12-16: 50 mg via INTRADERMAL

## 2012-12-16 MED ORDER — ROCURONIUM BROMIDE 100 MG/10ML IV SOLN
INTRAVENOUS | Status: DC | PRN
  Administered 2012-12-16: 35 mg via INTRAVENOUS

## 2012-12-16 MED ORDER — FENTANYL CITRATE 0.05 MG/ML IJ SOLN
INTRAMUSCULAR | Status: AC
  Filled 2012-12-16: qty 2

## 2012-12-16 MED ORDER — FENTANYL CITRATE 0.05 MG/ML IJ SOLN
25.0000 ug | INTRAMUSCULAR | Status: DC | PRN
  Administered 2012-12-16 (×4): 50 ug via INTRAVENOUS

## 2012-12-16 MED ORDER — KETOROLAC TROMETHAMINE 30 MG/ML IJ SOLN
30.0000 mg | Freq: Once | INTRAMUSCULAR | Status: AC
  Administered 2012-12-16: 30 mg via INTRAVENOUS

## 2012-12-16 MED ORDER — BUPIVACAINE-EPINEPHRINE PF 0.5-1:200000 % IJ SOLN
INTRAMUSCULAR | Status: AC
  Filled 2012-12-16: qty 20

## 2012-12-16 MED ORDER — SODIUM CHLORIDE 0.9 % IR SOLN
Status: DC | PRN
  Administered 2012-12-16 (×2): 1000 mL

## 2012-12-16 MED ORDER — CELECOXIB 100 MG PO CAPS
ORAL_CAPSULE | ORAL | Status: AC
  Filled 2012-12-16: qty 4

## 2012-12-16 MED ORDER — GLYCOPYRROLATE 0.2 MG/ML IJ SOLN
INTRAMUSCULAR | Status: DC | PRN
  Administered 2012-12-16: 0.2 mg via INTRAVENOUS

## 2012-12-16 MED ORDER — GLYCOPYRROLATE 0.2 MG/ML IJ SOLN
INTRAMUSCULAR | Status: AC
  Filled 2012-12-16: qty 1

## 2012-12-16 MED ORDER — HYDROMORPHONE HCL PF 1 MG/ML IJ SOLN
0.2500 mg | INTRAMUSCULAR | Status: DC | PRN
  Administered 2012-12-16 (×2): 0.5 mg via INTRAVENOUS

## 2012-12-16 MED ORDER — CELECOXIB 100 MG PO CAPS
400.0000 mg | ORAL_CAPSULE | Freq: Once | ORAL | Status: AC
  Administered 2012-12-16: 400 mg via ORAL

## 2012-12-16 MED ORDER — NEOSTIGMINE METHYLSULFATE 1 MG/ML IJ SOLN
INTRAMUSCULAR | Status: DC | PRN
  Administered 2012-12-16: 2 mg via INTRAVENOUS

## 2012-12-16 MED ORDER — PROPOFOL 10 MG/ML IV BOLUS
INTRAVENOUS | Status: DC | PRN
  Administered 2012-12-16: 160 mg via INTRAVENOUS

## 2012-12-16 MED ORDER — MUPIROCIN 2 % EX OINT
TOPICAL_OINTMENT | CUTANEOUS | Status: AC
  Filled 2012-12-16: qty 22

## 2012-12-16 MED ORDER — FENTANYL CITRATE 0.05 MG/ML IJ SOLN
INTRAMUSCULAR | Status: AC
  Filled 2012-12-16: qty 5

## 2012-12-16 MED ORDER — ONDANSETRON HCL 4 MG/2ML IJ SOLN
4.0000 mg | Freq: Once | INTRAMUSCULAR | Status: AC | PRN
  Administered 2012-12-16: 4 mg via INTRAVENOUS

## 2012-12-16 MED ORDER — MIDAZOLAM HCL 2 MG/2ML IJ SOLN
1.0000 mg | INTRAMUSCULAR | Status: DC | PRN
  Administered 2012-12-16: 2 mg via INTRAVENOUS

## 2012-12-16 MED ORDER — STERILE WATER FOR IRRIGATION IR SOLN
Status: DC | PRN
  Administered 2012-12-16 (×3): 1000 mL

## 2012-12-16 MED ORDER — ACETAMINOPHEN 10 MG/ML IV SOLN
1000.0000 mg | Freq: Once | INTRAVENOUS | Status: AC
  Administered 2012-12-16: 1000 mg via INTRAVENOUS

## 2012-12-16 MED ORDER — PREGABALIN 50 MG PO CAPS
50.0000 mg | ORAL_CAPSULE | Freq: Once | ORAL | Status: AC
  Administered 2012-12-16: 50 mg via ORAL

## 2012-12-16 MED ORDER — OXYCODONE-ACETAMINOPHEN 5-325 MG PO TABS
1.0000 | ORAL_TABLET | ORAL | Status: DC | PRN
Start: 2012-12-16 — End: 2012-12-27

## 2012-12-16 MED ORDER — LACTATED RINGERS IV SOLN
INTRAVENOUS | Status: DC
  Administered 2012-12-16 (×2): via INTRAVENOUS

## 2012-12-16 MED ORDER — KETOROLAC TROMETHAMINE 30 MG/ML IJ SOLN
INTRAMUSCULAR | Status: AC
  Filled 2012-12-16: qty 1

## 2012-12-16 MED ORDER — MIDAZOLAM HCL 5 MG/5ML IJ SOLN
INTRAMUSCULAR | Status: DC | PRN
  Administered 2012-12-16: 2 mg via INTRAVENOUS

## 2012-12-16 MED ORDER — ACETAMINOPHEN 10 MG/ML IV SOLN
INTRAVENOUS | Status: AC
  Filled 2012-12-16: qty 100

## 2012-12-16 MED ORDER — OXYCODONE HCL 5 MG PO TABS
5.0000 mg | ORAL_TABLET | Freq: Once | ORAL | Status: AC
  Administered 2012-12-16: 5 mg via ORAL

## 2012-12-16 MED ORDER — BUPIVACAINE-EPINEPHRINE PF 0.5-1:200000 % IJ SOLN
INTRAMUSCULAR | Status: AC
  Filled 2012-12-16: qty 10

## 2012-12-16 MED ORDER — PREGABALIN 50 MG PO CAPS
ORAL_CAPSULE | ORAL | Status: AC
  Filled 2012-12-16: qty 1

## 2012-12-16 MED ORDER — MUPIROCIN 2 % EX OINT
TOPICAL_OINTMENT | Freq: Two times a day (BID) | CUTANEOUS | Status: DC
  Administered 2012-12-16: 07:00:00 via NASAL

## 2012-12-16 MED ORDER — OXYCODONE HCL 5 MG PO TABS
ORAL_TABLET | ORAL | Status: AC
  Filled 2012-12-16: qty 1

## 2012-12-16 MED ORDER — MIDAZOLAM HCL 2 MG/2ML IJ SOLN
INTRAMUSCULAR | Status: AC
  Filled 2012-12-16: qty 2

## 2012-12-16 MED ORDER — LIDOCAINE HCL (PF) 1 % IJ SOLN
INTRAMUSCULAR | Status: AC
  Filled 2012-12-16: qty 5

## 2012-12-16 MED ORDER — FENTANYL CITRATE 0.05 MG/ML IJ SOLN
INTRAMUSCULAR | Status: DC | PRN
  Administered 2012-12-16 (×2): 100 ug via INTRAVENOUS
  Administered 2012-12-16: 50 ug via INTRAVENOUS
  Administered 2012-12-16: 100 ug via INTRAVENOUS
  Administered 2012-12-16 (×2): 50 ug via INTRAVENOUS
  Administered 2012-12-16: 100 ug via INTRAVENOUS

## 2012-12-16 MED ORDER — ONDANSETRON HCL 4 MG/2ML IJ SOLN
4.0000 mg | Freq: Once | INTRAMUSCULAR | Status: AC
  Administered 2012-12-16: 4 mg via INTRAVENOUS

## 2012-12-16 MED ORDER — PROPOFOL 10 MG/ML IV EMUL
INTRAVENOUS | Status: AC
  Filled 2012-12-16: qty 20

## 2012-12-16 MED ORDER — HYDROMORPHONE HCL PF 1 MG/ML IJ SOLN
INTRAMUSCULAR | Status: AC
  Filled 2012-12-16: qty 1

## 2012-12-16 MED ORDER — IBUPROFEN 800 MG PO TABS: 800.0000 mg | ORAL_TABLET | Freq: Three times a day (TID) | ORAL | Status: DC | PRN

## 2012-12-16 MED ORDER — ROCURONIUM BROMIDE 50 MG/5ML IV SOLN
INTRAVENOUS | Status: AC
  Filled 2012-12-16: qty 1

## 2012-12-16 MED ORDER — CEFAZOLIN SODIUM-DEXTROSE 2-3 GM-% IV SOLR
INTRAVENOUS | Status: DC | PRN
  Administered 2012-12-16: 2 g via INTRAVENOUS

## 2012-12-16 MED ORDER — CHLORHEXIDINE GLUCONATE 4 % EX LIQD: 60.0000 mL | Freq: Once | CUTANEOUS | Status: DC

## 2012-12-16 MED ORDER — CEFAZOLIN SODIUM-DEXTROSE 2-3 GM-% IV SOLR: 2.0000 g | INTRAVENOUS | Status: DC

## 2012-12-16 SURGICAL SUPPLY — 59 items
1.4 threaded guide wire ×2 IMPLANT
1.6mm non-threaded k-wire ×4 IMPLANT
BAG HAMPER (MISCELLANEOUS) ×2 IMPLANT
BANDAGE ELASTIC 3 VELCRO NS (GAUZE/BANDAGES/DRESSINGS) ×2 IMPLANT
BANDAGE ELASTIC 4 VELCRO NS (GAUZE/BANDAGES/DRESSINGS) ×4 IMPLANT
BANDAGE ELASTIC 6 VELCRO NS (GAUZE/BANDAGES/DRESSINGS) ×4 IMPLANT
BANDAGE ESMARK 4X12 BL STRL LF (DISPOSABLE) ×1 IMPLANT
BLADE SURG SZ10 CARB STEEL (BLADE) ×2 IMPLANT
BNDG COHESIVE 4X5 TAN STRL (GAUZE/BANDAGES/DRESSINGS) ×2 IMPLANT
BNDG ESMARK 4X12 BLUE STRL LF (DISPOSABLE) ×2
CHLORAPREP W/TINT 26ML (MISCELLANEOUS) ×2 IMPLANT
CLOTH BEACON ORANGE TIMEOUT ST (SAFETY) ×2 IMPLANT
COVER LIGHT HANDLE STERIS (MISCELLANEOUS) ×4 IMPLANT
CUFF TOURNIQUET SINGLE 34IN LL (TOURNIQUET CUFF) ×2 IMPLANT
DECANTER SPIKE VIAL GLASS SM (MISCELLANEOUS) ×4 IMPLANT
DRAPE C-ARM FOLDED MOBILE STRL (DRAPES) ×2 IMPLANT
DRAPE PROXIMA HALF (DRAPES) ×2 IMPLANT
DRILL 2.6X122MM WL AO SHAFT (BIT) ×2 IMPLANT
GAUZE XEROFORM 5X9 LF (GAUZE/BANDAGES/DRESSINGS) ×2 IMPLANT
GLOVE BIOGEL PI IND STRL 7.0 (GLOVE) ×3 IMPLANT
GLOVE BIOGEL PI INDICATOR 7.0 (GLOVE) ×3
GLOVE ECLIPSE 6.5 STRL STRAW (GLOVE) ×2 IMPLANT
GLOVE EXAM NITRILE MD LF STRL (GLOVE) ×4 IMPLANT
GLOVE SKINSENSE NS SZ8.0 LF (GLOVE) ×1
GLOVE SKINSENSE STRL SZ8.0 LF (GLOVE) ×1 IMPLANT
GLOVE SS BIOGEL STRL SZ 6.5 (GLOVE) ×1 IMPLANT
GLOVE SS N UNI LF 8.5 STRL (GLOVE) ×2 IMPLANT
GLOVE SUPERSENSE BIOGEL SZ 6.5 (GLOVE) ×1
GOWN STRL REIN XL XLG (GOWN DISPOSABLE) ×8 IMPLANT
INST SET MINOR BONE (KITS) ×2 IMPLANT
K-WIRE 1.6X150 (WIRE) ×2
K-WIRE FX150X1.6XKRSH (WIRE) ×2
K-WIRE ORTHOPEDIC 1.4X150L (WIRE) ×2
KIT ROOM TURNOVER APOR (KITS) ×2 IMPLANT
KWIRE FX150X1.6XKRSH (WIRE) ×2 IMPLANT
KWIRE ORTHOPEDIC 1.4X150L (WIRE) ×1 IMPLANT
MANIFOLD NEPTUNE II (INSTRUMENTS) ×2 IMPLANT
NEEDLE HYPO 21X1.5 SAFETY (NEEDLE) ×2 IMPLANT
NS IRRIG 1000ML POUR BTL (IV SOLUTION) ×4 IMPLANT
PACK BASIC LIMB (CUSTOM PROCEDURE TRAY) ×2 IMPLANT
PAD ABD 5X9 TENDERSORB (GAUZE/BANDAGES/DRESSINGS) ×4 IMPLANT
PAD ARMBOARD 7.5X6 YLW CONV (MISCELLANEOUS) ×2 IMPLANT
PAD CAST 4YDX4 CTTN HI CHSV (CAST SUPPLIES) ×3 IMPLANT
PADDING CAST COTTON 4X4 STRL (CAST SUPPLIES) ×3
PLATE 10H 132MM (Plate) ×2 IMPLANT
SCREW BONE 14MMX3.5MM (Screw) ×2 IMPLANT
SCREW BONE 3.5X16MM (Screw) ×8 IMPLANT
SCREW BONE NON-LCKING 3.5X12MM (Screw) ×2 IMPLANT
SET BASIN LINEN APH (SET/KITS/TRAYS/PACK) ×2 IMPLANT
SPLINT J IMMOBILIZER 4X20FT (CAST SUPPLIES) ×1 IMPLANT
SPLINT J PLASTER J 4INX20Y (CAST SUPPLIES) ×1
SPONGE GAUZE 4X4 12PLY (GAUZE/BANDAGES/DRESSINGS) ×2 IMPLANT
SPONGE LAP 18X18 X RAY DECT (DISPOSABLE) ×2 IMPLANT
STAPLER VISISTAT 35W (STAPLE) ×2 IMPLANT
SUT ETHILON 3 0 FSL (SUTURE) ×2 IMPLANT
SUT MON AB 0 CT1 (SUTURE) ×2 IMPLANT
SYR 30ML LL (SYRINGE) ×2 IMPLANT
SYR BULB IRRIGATION 50ML (SYRINGE) ×2 IMPLANT
WATER STERILE IRR 1000ML POUR (IV SOLUTION) ×6 IMPLANT

## 2012-12-16 NOTE — Telephone Encounter (Signed)
Patient called after getting home from hospital, surgery today, 12/16/12, with question about pain medication:  Oxycodone 5-325, which she said she was told by [facility] that it was not due until tomorrow, 12/17/12.  States pharmacist told her that if she had "the doctor's okay" that they can fill it today.  She said she has 1 pill for today.  Please advise.  Pt ph# [phone].

## 2012-12-16 NOTE — Op Note (Signed)
12/16/2012  9:26 AM  PATIENT:  Mallory Mendoza  32 y.o. female  PRE-OPERATIVE DIAGNOSIS:  bimalleolar ankle fracture left  POST-OPERATIVE DIAGNOSIS:  bimalleolar ankle fracture left  PROCEDURE:  Procedure(s) (LRB) with comments: OPEN REDUCTION INTERNAL FIXATION (ORIF) ANKLE FRACTURE (Left)    Details of procedure The site of surgical procedure identification was confirmed and the surgical site was marked. Chart update was completed  Patient was taken to the operating room for general anesthesia and 2 g of Ancef were started per protocol.   The limb was exsanguinated with a six-inch Esmarch, tourniquet was inflated to 3 mm of mercury.; the timeout was completed. A straight incision was then made over the fibula and the fracture extended proximally. Full-thickness skin flaps were started distally blunt dissection was carried out and the peroneus fascia was incised. The fracture site was exposed. This was a three-part comminuted fracture with other small splinters of bone. The most proximal fragment and metal fragment or reduced and stabilized with a K wire. A bone clamp was then used to reduce the middle fragment to the distal fragment. A radiograph was obtained to confirm length and rotation. A long plate was then applied to the fibula using nonlocking screws. Radiographs confirmed reduction.  Attention was then turned to the medial side the fracture was palpated through the skin and a straight incision was made over the fracture with full-thickness skin flaps. The fracture site was opened irrigated debrided the joint was abraded and irrigated. A bone clamp was used to reduce the fracture. The medial malleolus was in stabilized with 2 K wires which were then bent and impacted into bone. X-ray showed stabilization of the fracture with an intact mortise.  Both wounds were irrigated with saline. The medial side was closed with 3-0 nylon suture in interrupted fashion. ([place]gower)  The lateral side  was closed with 0 Monocryl and staples.  60 cc of Marcaine was then used in the following manner 20 cc was used to block the superficial peroneal nerve, the medial and lateral wounds were then injected with 40 cc of Marcaine.   STRYKE R LATERAL PLATE AND TOTAL 3 K WIRES  SURGEON:  Surgeon(s) and Role:    * Vickki Hearing, MD - Primary  PHYSICIAN ASSISTANT:   ASSISTANTS: BETTY ASHLEY    ANESTHESIA:   none  EBL:  Total I/O In: 900 [I.V.:900] Out: -   BLOOD ADMINISTERED:none  DRAINS: none   LOCAL MEDICATIONS USED:  MARCAINE WITH EPI    and Amount: 60 ml  SPECIMEN:  No Specimen  DISPOSITION OF SPECIMEN:  N/A  COUNTS:  YES  TOURNIQUET:   Total Tourniquet Time Documented: Thigh (Left) - 74 minutes  DICTATION: .Dragon Dictation  PLAN OF CARE: Discharge to home after PACU  PATIENT DISPOSITION:  PACU - hemodynamically stable.   Delay start of Pharmacological VTE agent (>24hrs) due to surgical blood loss or risk of bleeding: yes

## 2012-12-16 NOTE — Anesthesia Preprocedure Evaluation (Addendum)
Anesthesia Evaluation  Patient identified by MRN, date of birth, ID band Patient awake    Reviewed: Allergy & Precautions, H&P , NPO status , Patient's Chart, lab work & pertinent test results  History of Anesthesia Complications Negative for: history of anesthetic complications  Airway Mallampati: II TM Distance: >3 FB     Dental  (+) Teeth Intact   Pulmonary Current Smoker, PE: am cough. breath sounds clear to auscultation        Cardiovascular negative cardio ROS  Rhythm:Regular Rate:Normal     Neuro/Psych PSYCHIATRIC DISORDERS Anxiety Depression    GI/Hepatic negative GI ROS,   Endo/Other    Renal/GU      Musculoskeletal   Abdominal   Peds  Hematology   Anesthesia Other Findings   Reproductive/Obstetrics                           Anesthesia Physical Anesthesia Plan  ASA: II  Anesthesia Plan: General   Post-op Pain Management:    Induction: Intravenous  Airway Management Planned: Oral ETT  Additional Equipment:   Intra-op Plan:   Post-operative Plan: Extubation in OR  Informed Consent: I have reviewed the patients History and Physical, chart, labs and discussed the procedure including the risks, benefits and alternatives for the proposed anesthesia with the patient or authorized representative who has indicated his/her understanding and acceptance.     Plan Discussed with:   Anesthesia Plan Comments:         Anesthesia Quick Evaluation

## 2012-12-16 NOTE — Anesthesia Postprocedure Evaluation (Signed)
  Anesthesia Post-op Note  Patient: Mallory Mendoza  Procedure(s) Performed: Procedure(s) (LRB) with comments: OPEN REDUCTION INTERNAL FIXATION (ORIF) ANKLE FRACTURE (Left)  Patient Location: PACU  Anesthesia Type:General  Level of Consciousness: awake, alert , oriented and patient cooperative  Airway and Oxygen Therapy: Patient Spontanous Breathing  Post-op Pain: 3 /10, mild  Post-op Assessment: Post-op Vital signs reviewed, Patient's Cardiovascular Status Stable, Respiratory Function Stable, Patent Airway, No signs of Nausea or vomiting and Pain level controlled  Post-op Vital Signs: Reviewed and stable  Complications: No apparent anesthesia complications

## 2012-12-16 NOTE — Anesthesia Procedure Notes (Signed)
Procedure Name: Intubation Date/Time: 12/16/2012 7:38 AM Performed by: Despina Hidden Pre-anesthesia Checklist: Emergency Drugs available, Suction available, Patient identified and Patient being monitored Patient Re-evaluated:Patient Re-evaluated prior to inductionOxygen Delivery Method: Circle system utilized Preoxygenation: Pre-oxygenation with 100% oxygen Intubation Type: IV induction Ventilation: Mask ventilation without difficulty Laryngoscope Size: Mac and 3 Grade View: Grade I Tube type: Oral Tube size: 7.0 mm Number of attempts: 1 Airway Equipment and Method: Stylet Placement Confirmation: ETT inserted through vocal cords under direct vision,  breath sounds checked- equal and bilateral and positive ETCO2 Secured at: 22 cm Tube secured with: Tape Dental Injury: Teeth and Oropharynx as per pre-operative assessment

## 2012-12-16 NOTE — Transfer of Care (Signed)
Immediate Anesthesia Transfer of Care Note  Patient: Mallory Mendoza  Procedure(s) Performed: Procedure(s) (LRB) with comments: OPEN REDUCTION INTERNAL FIXATION (ORIF) ANKLE FRACTURE (Left)  Patient Location: PACU  Anesthesia Type:General  Level of Consciousness: awake, alert , oriented and patient cooperative  Airway & Oxygen Therapy: Patient Spontanous Breathing and Patient connected to face mask oxygen  Post-op Assessment: Report given to PACU RN, Post -op Vital signs reviewed and stable and Patient moving all extremities  Post vital signs: Reviewed and stable  Complications: No apparent anesthesia complications

## 2012-12-16 NOTE — Brief Op Note (Addendum)
12/16/2012  9:26 AM  PATIENT:  Mallory Mendoza  32 y.o. female  PRE-OPERATIVE DIAGNOSIS:  bimalleolar ankle fracture left  POST-OPERATIVE DIAGNOSIS:  bimalleolar ankle fracture left  PROCEDURE:  Procedure(s) (LRB) with comments: OPEN REDUCTION INTERNAL FIXATION (ORIF) ANKLE FRACTURE (Left)  STRYKER LATERAL PLATE AND TOTAL 3 K WIRES  SURGEON:  Surgeon(s) and Role:    * Vickki Hearing, MD - Primary  PHYSICIAN ASSISTANT:   ASSISTANTS: BETTY ASHLEY    ANESTHESIA:   none  EBL:  Total I/O In: 900 [I.V.:900] Out: -   BLOOD ADMINISTERED:none  DRAINS: none   LOCAL MEDICATIONS USED:  MARCAINE WITH EPI    and Amount: 60 ml  SPECIMEN:  No Specimen  DISPOSITION OF SPECIMEN:  N/A  COUNTS:  YES  TOURNIQUET:   Total Tourniquet Time Documented: Thigh (Left) - 74 minutes  DICTATION: .Dragon Dictation  PLAN OF CARE: Discharge to home after PACU  PATIENT DISPOSITION:  PACU - hemodynamically stable.   Delay start of Pharmacological VTE agent (>24hrs) due to surgical blood loss or risk of bleeding: yes

## 2012-12-16 NOTE — Interval H&P Note (Signed)
History and Physical Interval Note:  12/16/2012 7:24 AM  Mallory Mendoza  has presented today for surgery, with the diagnosis of bimalleolar ankle fracture left  The various methods of treatment have been discussed with the patient and family. After consideration of risks, benefits and other options for treatment, the patient has consented to  Procedure(s) (LRB) with comments: OPEN REDUCTION INTERNAL FIXATION (ORIF) ANKLE FRACTURE (Left) as a surgical intervention .  The patient's history has been reviewed, patient examined, no change in status, stable for surgery.  I have reviewed the patient's chart and labs.  Questions were answered to the patient's satisfaction.     [name]

## 2012-12-16 NOTE — Telephone Encounter (Signed)
Office notes of 12/12/2012 visit and treatment plan, which includes surgery, faxed to Dr. Bevelyn Ngo, primary care provider, [facility] at fax [phone].

## 2012-12-16 NOTE — Progress Notes (Signed)
Awake. Rates pain 5. Lt toes pink in color/warm to touch. Moves lt toes without difficulty. Ice pack to lt ankle. Lt leg elevated.

## 2012-12-16 NOTE — Progress Notes (Signed)
From OR. Awakens easily to name. Oriented to place per nurse. Lt leg elevated on pillow. Ice pack applied to lt ankle. Moves lt toes without difficulty. Lt toes pink in color/warm to touch.

## 2012-12-19 ENCOUNTER — Ambulatory Visit (INDEPENDENT_AMBULATORY_CARE_PROVIDER_SITE_OTHER): Payer: Medicaid Other | Admitting: Orthopedic Surgery

## 2012-12-19 ENCOUNTER — Encounter: Payer: Self-pay | Admitting: Orthopedic Surgery

## 2012-12-19 VITALS — BP 112/80 | Ht 62.0 in | Wt 172.0 lb

## 2012-12-19 DIAGNOSIS — S82843A Displaced bimalleolar fracture of unspecified lower leg, initial encounter for closed fracture: Secondary | ICD-10-CM

## 2012-12-19 NOTE — Patient Instructions (Signed)
Continue elevation

## 2012-12-19 NOTE — Progress Notes (Signed)
Patient ID: Mallory Mendoza, female   DOB: 10/07/1981, 32 y.o.   MRN: [id number] Chief Complaint  Patient presents with  . Follow-up    Post op #1, left ankle fracture.DOS 12-16-12. Change cast and dressing.    BP 112/80  Ht 5\' 2"  (1.575 m)  Wt 172 lb (78.019 kg)  BMI 31.46 kg/m2  LMP 12/12/2012  1. Ankle fracture, bimalleolar, closed     Postop visit #1 status post open treatment internal fixation of bimalleolar left ankle fracture  Wound check was good today swelling is down patient placed in posterior splint with the foot in neutral position  Continue current pain medication return on postop day 11 for wound check, staples out, sutures out, x-ray and application short leg nonweightbearing cast

## 2012-12-27 ENCOUNTER — Ambulatory Visit (INDEPENDENT_AMBULATORY_CARE_PROVIDER_SITE_OTHER): Payer: Medicaid Other | Admitting: Orthopedic Surgery

## 2012-12-27 ENCOUNTER — Encounter: Payer: Self-pay | Admitting: Orthopedic Surgery

## 2012-12-27 ENCOUNTER — Ambulatory Visit (INDEPENDENT_AMBULATORY_CARE_PROVIDER_SITE_OTHER): Payer: Medicaid Other

## 2012-12-27 VITALS — BP 118/80 | Ht 62.0 in | Wt 172.0 lb

## 2012-12-27 DIAGNOSIS — S82899A Other fracture of unspecified lower leg, initial encounter for closed fracture: Secondary | ICD-10-CM

## 2012-12-27 DIAGNOSIS — S82843A Displaced bimalleolar fracture of unspecified lower leg, initial encounter for closed fracture: Secondary | ICD-10-CM

## 2012-12-27 DIAGNOSIS — S82892A Other fracture of left lower leg, initial encounter for closed fracture: Secondary | ICD-10-CM

## 2012-12-27 MED ORDER — OXYCODONE-ACETAMINOPHEN 5-325 MG PO TABS: 1.0000 | ORAL_TABLET | ORAL | Status: DC | PRN

## 2012-12-27 NOTE — Progress Notes (Signed)
Patient ID: Mallory Mendoza, female   DOB: 06-23-1981, 32 y.o.   MRN: [id number] Chief Complaint  Patient presents with  . Follow-up    Post op # 2 for left ankle fracture DOS 12/16/12    Bimalleolar ankle fracture fixation with a Stryker plate laterally bypassing the major fracture fragments and then medial K wires.  Today, she is for suture removal, which was done part of her staples are removed. We will remove the remainder in a few days.  Her x-ray shows that the hardware is intact. She has good ankle mortise reconstruction.  The pin noted on the x-ray is in partly bone and soft tissue and should not be of any consequence. We can monitor that with serial x-rays as the fracture is healing.  She is placed back in a posterior splint to followup for removal of the remaining staples.

## 2013-01-03 ENCOUNTER — Encounter (HOSPITAL_COMMUNITY): Payer: Self-pay | Admitting: Pharmacy Technician

## 2013-01-03 ENCOUNTER — Ambulatory Visit (INDEPENDENT_AMBULATORY_CARE_PROVIDER_SITE_OTHER): Payer: Medicaid Other | Admitting: Orthopedic Surgery

## 2013-01-03 VITALS — BP 104/78 | Ht 62.0 in | Wt 172.0 lb

## 2013-01-03 DIAGNOSIS — S82843A Displaced bimalleolar fracture of unspecified lower leg, initial encounter for closed fracture: Secondary | ICD-10-CM

## 2013-01-03 NOTE — Progress Notes (Signed)
Patient ID: Mallory Mendoza, female   DOB: 02-04-1981, 32 y.o.   MRN: [id number] Chief Complaint  Patient presents with  . Follow-up    1 week recheck on left ankle fracture. DOS 12-16-12.   The patient came in today for application of short-leg cast however she cannot bring her foot in neutral position and I decided to go ahead and do a cast application under anesthesia  Today we will place a posterior splint  The Orthoplast splint is prepared dressings are applied splint is held with fluid and is near neutral position is the patient will tolerate  She will be set scheduled for surgery for Thursday. Continue current medication

## 2013-01-03 NOTE — Patient Instructions (Addendum)
Surgery for cast application [id number]

## 2013-01-04 ENCOUNTER — Encounter (HOSPITAL_COMMUNITY): Payer: Self-pay

## 2013-01-04 ENCOUNTER — Encounter (HOSPITAL_COMMUNITY)
Admission: RE | Admit: 2013-01-04 | Discharge: 2013-01-04 | Disposition: A | Discharge status: Home / Self Care | Payer: Medicaid Other | Source: Ambulatory Visit | Attending: Orthopedic Surgery | Admitting: Orthopedic Surgery

## 2013-01-05 ENCOUNTER — Encounter (HOSPITAL_COMMUNITY): Payer: Self-pay | Admitting: Anesthesiology

## 2013-01-05 ENCOUNTER — Ambulatory Visit (HOSPITAL_COMMUNITY)
Admission: RE | Admit: 2013-01-05 | Discharge: 2013-01-05 | Disposition: A | Discharge status: Home / Self Care | Payer: Medicaid Other | Source: Ambulatory Visit | Attending: Orthopedic Surgery | Admitting: Orthopedic Surgery

## 2013-01-05 ENCOUNTER — Ambulatory Visit (HOSPITAL_COMMUNITY): Payer: Medicaid Other | Admitting: Anesthesiology

## 2013-01-05 ENCOUNTER — Encounter (HOSPITAL_COMMUNITY): Payer: Self-pay | Admitting: *Deleted

## 2013-01-05 ENCOUNTER — Encounter (HOSPITAL_COMMUNITY)
Admission: RE | Disposition: A | Discharge status: Home / Self Care | Payer: Self-pay | Source: Ambulatory Visit | Attending: Orthopedic Surgery

## 2013-01-05 DIAGNOSIS — S82843A Displaced bimalleolar fracture of unspecified lower leg, initial encounter for closed fracture: Secondary | ICD-10-CM | POA: Insufficient documentation

## 2013-01-05 DIAGNOSIS — X58XXXA Exposure to other specified factors, initial encounter: Secondary | ICD-10-CM | POA: Insufficient documentation

## 2013-01-05 HISTORY — PX: CAST APPLICATION: SHX380

## 2013-01-05 SURGERY — APPLICATION, CAST
Anesthesia: General | Site: Ankle | Laterality: Left | Wound class: Clean

## 2013-01-05 MED ORDER — MIDAZOLAM HCL 2 MG/2ML IJ SOLN
INTRAMUSCULAR | Status: AC
  Filled 2013-01-05: qty 2

## 2013-01-05 MED ORDER — ONDANSETRON HCL 4 MG/2ML IJ SOLN: 4.0000 mg | Freq: Once | INTRAMUSCULAR | Status: DC | PRN

## 2013-01-05 MED ORDER — PROPOFOL 10 MG/ML IV BOLUS
INTRAVENOUS | Status: DC | PRN
  Administered 2013-01-05: 160 mg via INTRAVENOUS

## 2013-01-05 MED ORDER — FENTANYL CITRATE 0.05 MG/ML IJ SOLN
INTRAMUSCULAR | Status: DC | PRN
  Administered 2013-01-05: 25 ug via INTRAVENOUS
  Administered 2013-01-05: 50 ug via INTRAVENOUS
  Administered 2013-01-05: 25 ug via INTRAVENOUS

## 2013-01-05 MED ORDER — LIDOCAINE HCL (CARDIAC) 20 MG/ML IV SOLN
INTRAVENOUS | Status: DC | PRN
  Administered 2013-01-05: 40 mg via INTRAVENOUS

## 2013-01-05 MED ORDER — FENTANYL CITRATE 0.05 MG/ML IJ SOLN: 25.0000 ug | INTRAMUSCULAR | Status: DC | PRN

## 2013-01-05 MED ORDER — FENTANYL CITRATE 0.05 MG/ML IJ SOLN
25.0000 ug | INTRAMUSCULAR | Status: DC | PRN
  Administered 2013-01-05 (×2): 25 ug via INTRAVENOUS

## 2013-01-05 MED ORDER — FENTANYL CITRATE 0.05 MG/ML IJ SOLN
INTRAMUSCULAR | Status: AC
  Filled 2013-01-05: qty 2

## 2013-01-05 MED ORDER — MIDAZOLAM HCL 2 MG/2ML IJ SOLN
1.0000 mg | INTRAMUSCULAR | Status: DC | PRN
  Administered 2013-01-05 (×2): 2 mg via INTRAVENOUS

## 2013-01-05 MED ORDER — STERILE WATER FOR IRRIGATION IR SOLN
Status: DC | PRN
  Administered 2013-01-05 (×2): 1000 mL

## 2013-01-05 MED ORDER — OXYCODONE-ACETAMINOPHEN 5-325 MG PO TABS: 1.0000 | ORAL_TABLET | ORAL | Status: DC | PRN

## 2013-01-05 MED ORDER — LACTATED RINGERS IV SOLN
INTRAVENOUS | Status: DC
Start: 2013-01-05 — End: 2013-01-05
  Administered 2013-01-05: 12:00:00 via INTRAVENOUS

## 2013-01-05 SURGICAL SUPPLY — 15 items
BNDG CONFORM 6X.82 1P STRL (GAUZE/BANDAGES/DRESSINGS) ×2 IMPLANT
CLOTH BEACON ORANGE TIMEOUT ST (SAFETY) ×2 IMPLANT
GAUZE XEROFORM 5X9 LF (GAUZE/BANDAGES/DRESSINGS) ×2 IMPLANT
KIT ROOM TURNOVER APOR (KITS) ×2 IMPLANT
PAD CAST 3X4 CTTN HI CHSV (CAST SUPPLIES) IMPLANT
PAD CAST 4YDX4 CTTN HI CHSV (CAST SUPPLIES) ×2 IMPLANT
PADDING CAST COTTON 3X4 STRL (CAST SUPPLIES)
PADDING CAST COTTON 4X4 STRL (CAST SUPPLIES) ×2
PADDING CAST COTTON 6X4 STRL (CAST SUPPLIES) IMPLANT
STOCKINETTE TUBE COTTON 3IN (GAUZE/BANDAGES/DRESSINGS) IMPLANT
STOCKINETTE TUBULAR 6 INCH (GAUZE/BANDAGES/DRESSINGS) IMPLANT
STOCKINETTE TUBULAR COTT 4X25 (GAUZE/BANDAGES/DRESSINGS) ×2 IMPLANT
STOCKINETTE TUBULAR COTTON 3IN (GAUZE/BANDAGES/DRESSINGS)
TAPE CAST 4X4 WHT DELT L NS LF (CAST SUPPLIES) ×4 IMPLANT
WATER STERILE IRR 1000ML POUR (IV SOLUTION) ×4 IMPLANT

## 2013-01-05 NOTE — Brief Op Note (Addendum)
01/05/2013  1:33 PM  PATIENT:  Mallory Mendoza  32 y.o. female  PRE-OPERATIVE DIAGNOSIS:  left ankle fracture  POST-OPERATIVE DIAGNOSIS:  left ankle fracture  PROCEDURE:  Procedure(s): CAST APPLICATION (Left) leg   Findings: tight heel cord  SURGEON:  Surgeon(s) and Role:    * Vickki Hearing, MD - Primary  PHYSICIAN ASSISTANT:   ASSISTANTS: none   ANESTHESIA:   general  EBL:  Total I/O In: 500 [I.V.:500] Out: 0   BLOOD ADMINISTERED:none  DRAINS: none   LOCAL MEDICATIONS USED:  NONE  SPECIMEN:  No Specimen  DISPOSITION OF SPECIMEN:  N/A  COUNTS:  YES  TOURNIQUET:  * No tourniquets in log *  DICTATION: .Dragon Dictation  PLAN OF CARE: Discharge to home after PACU  PATIENT DISPOSITION:  PACU - hemodynamically stable.   Delay start of Pharmacological VTE agent (>24hrs) due to surgical blood loss or risk of bleeding: not applicable

## 2013-01-05 NOTE — Transfer of Care (Signed)
Immediate Anesthesia Transfer of Care Note  Patient: Mallory Mendoza  Procedure(s) Performed: Procedure(s): CAST APPLICATION (Left)  Patient Location: PACU  Anesthesia Type:General  Level of Consciousness: awake, alert  and oriented  Airway & Oxygen Therapy: Patient Spontanous Breathing and Patient connected to face mask oxygen  Post-op Assessment: Report given to PACU RN  Post vital signs: Reviewed and stable  Complications: No apparent anesthesia complications

## 2013-01-05 NOTE — H&P (Addendum)
  Chief complaint need cast left ankle  This patient is status post open treatment internal fixation of the left ankle about 3 weeks ago. During the postoperative period we were unable to cast her leg in the neutral position so she is brought into the hospital today for application of cast under anesthesia  She still continues to complain of some pain and numbness on the dorsum of her foot. She's maintained on Percocet with relatively good relief.  Review of systems negative  Past Medical History  Diagnosis Date  . Depression   . Anxiety   . Depression     Past Surgical History  Procedure Laterality Date  . Cesarean section    . Tubal ligation    . Orif ankle fracture  12/16/2012    Procedure: OPEN REDUCTION INTERNAL FIXATION (ORIF) ANKLE FRACTURE;  Surgeon: Vickki Hearing, MD;  Location: [facility];  Service: Orthopedics;  Laterality: Left;    History reviewed. No pertinent family history.  History  Substance Use Topics  . Smoking status: Current Every Day Smoker -- 1.00 packs/day    Types: Cigarettes  . Smokeless tobacco: Not on file  . Alcohol Use: Yes     Comment: occ    No current facility-administered medications on file prior to encounter.   Current Outpatient Prescriptions on File Prior to Encounter  Medication Sig Dispense Refill  . FLUoxetine (PROZAC) 20 MG capsule Take 20 mg by mouth daily.        Physical Exam(12)  Vital signs: BP 107/75  Pulse 93  Temp(Src) 98.3 F (36.8 C) (Oral)  LMP 12/12/2012   GENERAL: normal development   CDV: pulses are normal   Skin: normal  Lymph: nodes were not palpable/normal  Psychiatric: awake, alert and oriented  Neuro: normal sensation  MSK  Gait: Supported by crutches nonweightbearing 1 Inspection the left ankle and foot are swollen there is intact wounds with clean dry appearance 2 Range of Motion she cannot bring the foot into the neutral position to cast she does have 20 of plantarflexion 3 Motor global  weakness in the ankle secondary to pain and swelling 4 Stability stability of the joint is normal  5 Upper extremity exam  The right and left upper extremity:   Inspection and palpation revealed no abnormalities in the upper extremities.   Range of motion is full without contracture.  Motor exam is normal with grade 5 strength.  The joints are fully reduced without subluxation.  There is no atrophy or tremor and muscle tone is normal.  All joints are stable.    6 the right leg is noted to be without tenderness pain or swelling, range of motion. Bili normal muscle tone normal  Imaging the most recent image shows a stable construct for bimalleolar fracture the right ankle  Assessment: Status post open treatment internal fixation left ankle with inability to cast    Plan: Short leg cast left ankle under anesthesia

## 2013-01-05 NOTE — Anesthesia Postprocedure Evaluation (Signed)
  Anesthesia Post-op Note  Patient: Mallory Mendoza  Procedure(s) Performed: Procedure(s): CAST APPLICATION (Left)  Patient Location: PACU  Anesthesia Type:General  Level of Consciousness: awake, alert  and oriented  Airway and Oxygen Therapy: Patient Spontanous Breathing and Patient connected to face mask oxygen  Post-op Pain: none  Post-op Assessment: Post-op Vital signs reviewed, Patient's Cardiovascular Status Stable, Respiratory Function Stable and Patent Airway  Post-op Vital Signs: Reviewed and stable  Complications: No apparent anesthesia complications

## 2013-01-05 NOTE — Anesthesia Procedure Notes (Signed)
Procedure Name: LMA Insertion Date/Time: 01/05/2013 1:16 PM Performed by: Glynn Octave E Pre-anesthesia Checklist: Patient identified, Patient being monitored, Emergency Drugs available, Timeout performed and Suction available Patient Re-evaluated:Patient Re-evaluated prior to inductionOxygen Delivery Method: Circle System Utilized Preoxygenation: Pre-oxygenation with 100% oxygen Intubation Type: IV induction Ventilation: Mask ventilation without difficulty LMA: LMA inserted LMA Size: 3.0 Number of attempts: 1 Placement Confirmation: positive ETCO2 and breath sounds checked- equal and bilateral Tube secured with: Tape

## 2013-01-05 NOTE — Anesthesia Preprocedure Evaluation (Addendum)
Anesthesia Evaluation  Patient identified by MRN, date of birth, ID band Patient awake    Reviewed: Allergy & Precautions, H&P , NPO status , Patient's Chart, lab work & pertinent test results  History of Anesthesia Complications Negative for: history of anesthetic complications  Airway Mallampati: II TM Distance: >3 FB     Dental  (+) Teeth Intact, Poor Dentition and Chipped,    Pulmonary Current Smoker, PE: am cough. breath sounds clear to auscultation        Cardiovascular negative cardio ROS  Rhythm:Regular Rate:Normal     Neuro/Psych PSYCHIATRIC DISORDERS Anxiety Depression    GI/Hepatic negative GI ROS,   Endo/Other    Renal/GU      Musculoskeletal   Abdominal   Peds  Hematology   Anesthesia Other Findings   Reproductive/Obstetrics                          Anesthesia Physical Anesthesia Plan  ASA: II  Anesthesia Plan: General   Post-op Pain Management:    Induction: Intravenous  Airway Management Planned: LMA  Additional Equipment:   Intra-op Plan:   Post-operative Plan: Extubation in OR  Informed Consent: I have reviewed the patients History and Physical, chart, labs and discussed the procedure including the risks, benefits and alternatives for the proposed anesthesia with the patient or authorized representative who has indicated his/her understanding and acceptance.     Plan Discussed with:   Anesthesia Plan Comments:         Anesthesia Quick Evaluation

## 2013-01-05 NOTE — Op Note (Signed)
01/05/2013  1:33 PM  PATIENT:  Mallory Mendoza  32 y.o. female  PRE-OPERATIVE DIAGNOSIS:  left ankle fracture  POST-OPERATIVE DIAGNOSIS:  left ankle fracture  PROCEDURE:  Procedure(s): CAST APPLICATION (Left) leg   Findings: tight heel cord  Details of procedure  The patient was identified in the preoperative holding area and site marking was performed after confirmation the operative site.  The patient was taken to the operating room for general anesthesia. After timeout a short leg cast was applied with the foot in neutral position.  The patient was then taken back to the recovery room after reversal of anesthetic agent.  SURGEON:  Surgeon(s) and Role:    * Vickki Hearing, MD - Primary  PHYSICIAN ASSISTANT:   ASSISTANTS: none   ANESTHESIA:   general  EBL:  Total I/O In: 500 [I.V.:500] Out: 0   BLOOD ADMINISTERED:none  DRAINS: none   LOCAL MEDICATIONS USED:  NONE  SPECIMEN:  No Specimen  DISPOSITION OF SPECIMEN:  N/A  COUNTS:  YES  TOURNIQUET:  * No tourniquets in log *  DICTATION: .Dragon Dictation  PLAN OF CARE: Discharge to home after PACU  PATIENT DISPOSITION:  PACU - hemodynamically stable.   Delay start of Pharmacological VTE agent (>24hrs) due to surgical blood loss or risk of bleeding: not applicable

## 2013-01-05 NOTE — Interval H&P Note (Signed)
History and Physical Interval Note:  01/05/2013 1:04 PM  Mallory Mendoza  has presented today for surgery, with the diagnosis of left ankle fracture  The various methods of treatment have been discussed with the patient and family. After consideration of risks, benefits and other options for treatment, the patient has consented to  Procedure(s): CAST APPLICATION (Left) as a surgical intervention .  The patient's history has been reviewed, patient examined, no change in status, stable for surgery.  I have reviewed the patient's chart and labs.  Questions were answered to the patient's satisfaction.     [name]

## 2013-01-09 ENCOUNTER — Encounter (HOSPITAL_COMMUNITY): Payer: Self-pay | Admitting: Orthopedic Surgery

## 2013-01-12 ENCOUNTER — Ambulatory Visit (INDEPENDENT_AMBULATORY_CARE_PROVIDER_SITE_OTHER): Payer: Medicaid Other | Admitting: Orthopedic Surgery

## 2013-01-12 ENCOUNTER — Encounter: Payer: Self-pay | Admitting: Orthopedic Surgery

## 2013-01-12 VITALS — BP 102/70 | Ht 62.0 in | Wt 172.0 lb

## 2013-01-12 DIAGNOSIS — S82842D Displaced bimalleolar fracture of left lower leg, subsequent encounter for closed fracture with routine healing: Secondary | ICD-10-CM

## 2013-01-12 DIAGNOSIS — S8290XD Unspecified fracture of unspecified lower leg, subsequent encounter for closed fracture with routine healing: Secondary | ICD-10-CM

## 2013-01-12 NOTE — Progress Notes (Signed)
Patient ID: Mallory Mendoza, female   DOB: Apr 30, 1981, 32 y.o.   MRN: [id number] Chief Complaint  Patient presents with  . Follow-up    9 day cast check of left ankle fracture. Date of surgery January 31 second surgery was done 18 February cast application under anesthesia    Cast is intact patient says she is much more comfortable  Come back in a month for x-rays possible brace application we'll do the x-ray out of plaster should be able to start weightbearing

## 2013-01-12 NOTE — Patient Instructions (Signed)
No weight-bearing

## 2013-01-17 ENCOUNTER — Other Ambulatory Visit: Payer: Self-pay | Admitting: *Deleted

## 2013-01-17 ENCOUNTER — Telehealth: Payer: Self-pay | Admitting: Orthopedic Surgery

## 2013-01-17 MED ORDER — OXYCODONE-ACETAMINOPHEN 5-325 MG PO TABS: 1.0000 | ORAL_TABLET | ORAL | Status: DC | PRN

## 2013-01-17 NOTE — Telephone Encounter (Signed)
Refill and have her pick up   i need to sign

## 2013-01-17 NOTE — Telephone Encounter (Signed)
Refilled per Dr. Romeo Apple, Called patient to pick up.

## 2013-01-17 NOTE — Telephone Encounter (Signed)
Mallory Mendoza is asking for a new prescription for Oxycodone.  If you change to another pain medicine, she uses [facility] in [place].  Her # [phone]

## 2013-02-08 ENCOUNTER — Ambulatory Visit (INDEPENDENT_AMBULATORY_CARE_PROVIDER_SITE_OTHER): Payer: Medicaid Other | Admitting: Orthopedic Surgery

## 2013-02-08 ENCOUNTER — Ambulatory Visit (INDEPENDENT_AMBULATORY_CARE_PROVIDER_SITE_OTHER): Payer: Medicaid Other

## 2013-02-08 VITALS — BP 112/80 | Ht 62.0 in | Wt 172.0 lb

## 2013-02-08 DIAGNOSIS — S82892D Other fracture of left lower leg, subsequent encounter for closed fracture with routine healing: Secondary | ICD-10-CM

## 2013-02-08 DIAGNOSIS — S8290XD Unspecified fracture of unspecified lower leg, subsequent encounter for closed fracture with routine healing: Secondary | ICD-10-CM

## 2013-02-08 DIAGNOSIS — S82899A Other fracture of unspecified lower leg, initial encounter for closed fracture: Secondary | ICD-10-CM | POA: Insufficient documentation

## 2013-02-08 MED ORDER — OXYCODONE-ACETAMINOPHEN 5-325 MG PO TABS: 1.0000 | ORAL_TABLET | ORAL | Status: DC | PRN

## 2013-02-08 NOTE — Progress Notes (Signed)
Patient ID: Mallory Mendoza, female   DOB: 1981/08/24, 32 y.o.   MRN: [id number] Chief Complaint  Patient presents with  . Follow-up    1 month recheck of left ankle with xray OOP. DOS 12-16-12.    BP 112/80  Ht 5\' 2"  (1.575 m)  Wt 172 lb (78.019 kg)  BMI 31.45 kg/m2  Post op week 6 OTIF   Cast applied in OR at week 2   xrays show fracture healing   Incisions are intact   Re apply slc walking   xr oop in 6 wks

## 2013-02-08 NOTE — Patient Instructions (Addendum)
Weight bearing in the cast as tolerated

## 2013-02-09 ENCOUNTER — Ambulatory Visit: Payer: Medicaid Other | Admitting: Orthopedic Surgery

## 2013-02-21 ENCOUNTER — Telehealth: Payer: Self-pay | Admitting: Orthopedic Surgery

## 2013-02-21 ENCOUNTER — Other Ambulatory Visit: Payer: Self-pay | Admitting: Orthopedic Surgery

## 2013-02-21 DIAGNOSIS — S82842D Displaced bimalleolar fracture of left lower leg, subsequent encounter for closed fracture with routine healing: Secondary | ICD-10-CM

## 2013-02-21 MED ORDER — OXYCODONE-ACETAMINOPHEN 5-325 MG PO TABS: 1.0000 | ORAL_TABLET | ORAL | Status: DC | PRN

## 2013-02-21 NOTE — Telephone Encounter (Signed)
Calton Dach is asking for a new prescription for Oxycodone.  Her # [phone]

## 2013-02-22 NOTE — Telephone Encounter (Signed)
Prescription written and patient picked up

## 2013-03-09 ENCOUNTER — Ambulatory Visit (INDEPENDENT_AMBULATORY_CARE_PROVIDER_SITE_OTHER): Payer: Medicaid Other | Admitting: Orthopedic Surgery

## 2013-03-09 ENCOUNTER — Encounter: Payer: Self-pay | Admitting: Orthopedic Surgery

## 2013-03-09 VITALS — BP 120/80 | Ht 62.0 in | Wt 172.0 lb

## 2013-03-09 DIAGNOSIS — S82842D Displaced bimalleolar fracture of left lower leg, subsequent encounter for closed fracture with routine healing: Secondary | ICD-10-CM

## 2013-03-09 DIAGNOSIS — S8290XD Unspecified fracture of unspecified lower leg, subsequent encounter for closed fracture with routine healing: Secondary | ICD-10-CM

## 2013-03-09 NOTE — Progress Notes (Signed)
Patient ID: Mallory Mendoza, female   DOB: [birth year], 32 y.o.   MRN: [id number] Chief Complaint  Patient presents with  . Cast Problem    hole in the bottom of it    The patient wore a cast and she wore a hole in the foot of the cast we took the cast off her skin is dry but intact we placed her in an Aircast and instructed her on range of motion exercises return in 2 weeks x-ray ankle

## 2013-03-13 ENCOUNTER — Telehealth: Payer: Self-pay | Admitting: Orthopedic Surgery

## 2013-03-13 ENCOUNTER — Encounter: Payer: Self-pay | Admitting: Orthopedic Surgery

## 2013-03-13 NOTE — Telephone Encounter (Signed)
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  Please advise.  Her next appointment is 03/23/13.  Her ph# is [phone].

## 2013-03-13 NOTE — Telephone Encounter (Signed)
Approved.  

## 2013-03-23 ENCOUNTER — Encounter: Payer: Self-pay | Admitting: Orthopedic Surgery

## 2013-03-23 ENCOUNTER — Ambulatory Visit (INDEPENDENT_AMBULATORY_CARE_PROVIDER_SITE_OTHER): Payer: Medicaid Other

## 2013-03-23 ENCOUNTER — Ambulatory Visit (INDEPENDENT_AMBULATORY_CARE_PROVIDER_SITE_OTHER): Payer: Medicaid Other | Admitting: Orthopedic Surgery

## 2013-03-23 VITALS — BP 102/66 | Ht 62.0 in | Wt 172.0 lb

## 2013-03-23 DIAGNOSIS — S82892D Other fracture of left lower leg, subsequent encounter for closed fracture with routine healing: Secondary | ICD-10-CM

## 2013-03-23 DIAGNOSIS — S8290XD Unspecified fracture of unspecified lower leg, subsequent encounter for closed fracture with routine healing: Secondary | ICD-10-CM

## 2013-03-23 MED ORDER — OXYCODONE-ACETAMINOPHEN 5-325 MG PO TABS: 1.0000 | ORAL_TABLET | Freq: Four times a day (QID) | ORAL | Status: DC | PRN

## 2013-03-23 NOTE — Progress Notes (Signed)
Patient ID: Mallory Mendoza, female   DOB: 1981-04-08, 32 y.o.   MRN: [id number] Chief Complaint  Patient presents with  . Follow-up    6 week recheck left ankle DOS 12/16/12   BP 102/66  Ht 5\' 2"  (1.575 m)  Wt 172 lb (78.019 kg)  BMI 31.45 kg/m2  Review of systems decreased sensation dorsum of the foot  Followup visit post open treatment internal fixation left ankle. Recently placed in an Aircast started weightbearing  Physical exam vital signs are stable as above. The patient is awake alert and oriented x3 mood and affect are normal general appearance is normal ambulation shows a turned out foot with a Achilles avoidance gait. Range of motion at the ankle joint limited in dorsiflexion to 5 but stable incisions clean dry and intact decreased sensation on the dorsum of the foot  Assessment  5 months approximately post left ankle fracture patient returned to work an Aircast doing reasonably well minimal swelling still has only about 5 of dorsiflexion at the ankle joint ankle joint otherwise stable x-ray show comminuted fracture with stabilization using medial and lateral fixation  Ankle mortise is intact  Recommend physical therapy to improve gait and range of motion  Continue Percocet for pain seems to be managing well without any abuse of the medication  Followup  in 3 months

## 2013-03-23 NOTE — Patient Instructions (Addendum)
Schedule PT

## 2013-03-31 ENCOUNTER — Ambulatory Visit (HOSPITAL_COMMUNITY)
Admission: RE | Admit: 2013-03-31 | Discharge: 2013-03-31 | Disposition: A | Discharge status: Home / Self Care | Payer: Medicaid Other | Source: Ambulatory Visit | Attending: Orthopedic Surgery | Admitting: Orthopedic Surgery

## 2013-03-31 DIAGNOSIS — R262 Difficulty in walking, not elsewhere classified: Secondary | ICD-10-CM | POA: Insufficient documentation

## 2013-03-31 DIAGNOSIS — M25469 Effusion, unspecified knee: Secondary | ICD-10-CM | POA: Insufficient documentation

## 2013-03-31 DIAGNOSIS — M25579 Pain in unspecified ankle and joints of unspecified foot: Secondary | ICD-10-CM | POA: Insufficient documentation

## 2013-03-31 DIAGNOSIS — M25673 Stiffness of unspecified ankle, not elsewhere classified: Secondary | ICD-10-CM | POA: Insufficient documentation

## 2013-03-31 DIAGNOSIS — IMO0001 Reserved for inherently not codable concepts without codable children: Secondary | ICD-10-CM | POA: Insufficient documentation

## 2013-03-31 NOTE — Evaluation (Signed)
Physical Therapy Evaluation  Patient Details  Name: Mallory Mendoza MRN: [id number] Date of Birth: [birth year] Charge: evaluation Today's Date: 03/31/2013 Time: 1345-1420 PT Time Calculation (min): 35 min              Visit#: 1 of 8  Re-eval: 04/30/13 Assessment Diagnosis: s/p ORIF of Left ankle. Surgical Date: 01/05/13 Next MD Visit: 06/16/2013 Prior Therapy: none  Authorization: medicaid     Past Medical History:  Past Medical History  Diagnosis Date  . Depression   . Anxiety   . Depression    Past Surgical History:  Past Surgical History  Procedure Laterality Date  . Cesarean section    . Tubal ligation    . Orif ankle fracture  12/16/2012    Procedure: OPEN REDUCTION INTERNAL FIXATION (ORIF) ANKLE FRACTURE;  Surgeon: Vickki Hearing, MD;  Location: [facility];  Service: Orthopedics;  Laterality: Left;  . Cast application Left 01/05/2013    Procedure: CAST APPLICATION;  Surgeon: Vickki Hearing, MD;  Location: [facility];  Service: Orthopedics;  Laterality: Left;    Subjective Symptoms/Limitations Symptoms: [name] fractured her L ankle on 1/16//2014 after a fall.  She had a surgical repair on 01/05/2013.  The patient states that the cast was removed on 03/06/2013 and she was placed in an aircast.  She uses the aircast all the time.  She has gone back to work. When she gets home  her ankle is  Significantly swollen and painful.  She has noticed that she no longer walks with her toes forward and is unsure why.  She is being referred to physical therapy to return the patient to her prior functional level.  How long can you sit comfortably?: no problem How long can you stand comfortably?: stands at work 7-2 with significant increased pain and swelling How long can you walk comfortably?: Pt has walked with aircast on for 30 minutes. Pain Assessment Currently in Pain?: Yes (Pt has just gotten out of bed; worst pain after work = 10/10) Pain Score:   1 Pain Location: Ankle Pain  Type: Chronic pain Pain Onset: More than a month ago Pain Frequency: Intermittent Pain Relieving Factors: get off foot Effect of Pain on Daily Activities: increases pain  Balance Screening Balance Screen Has the patient fallen in the past 6 months: Yes   Assessment LLE AROM (degrees) Left Ankle Dorsiflexion: 3 Left Ankle Plantar Flexion: 40 Left Ankle Inversion: 20 Left Ankle Eversion: 8 LLE Strength Left Ankle Dorsiflexion: 4/5 Left Ankle Plantar Flexion: 4/5 Left Ankle Inversion: 4/5 Left Ankle Eversion: 4/5  Exercise/Treatments Mobility/Balance  Static Standing Balance Single Leg Stance - Right Leg: 60 Single Leg Stance - Left Leg: 0    Ankle Exercises - Supine T-Band:  (10 x blue T-band given for home use) Other Supine Ankle Exercises: DF/PF; in/eversion x 10 reps      Physical Therapy Assessment and Plan PT Assessment and Plan Clinical Impression Statement: Pt is a 32 yo female who had an ORIF in February secondary to fracturing her anke.  The patient is now wearing an aircast at all times but is having significant pain and swelling at the end of her work shift. Exam demonstrates decreased ROM, strength and balance.   She is also unable to walk with a normal gait.  She will benefit from skilled PT to decrease her pain, improve her ROM , strength and balance as well as return her gait to normal Pt will benefit from skilled therapeutic intervention in  order to improve on the following deficits: Increased edema;Pain;Decreased strength;Decreased range of motion;Difficulty walking;Decreased balance Rehab Potential: Good PT Frequency: Min 2X/week PT Duration: 4 weeks PT Treatment/Interventions: Gait training;Therapeutic activities;Therapeutic exercise;Patient/family education;Manual techniques;Modalities    Goals Home Exercise Program Pt will Perform Home Exercise Program: Independently PT Short Term Goals Time to Complete Short Term Goals: 2 weeks PT Short Term Goal 1:  Pt ROM to be WFL to allow pt to have a normalized gait pattern. PT Short Term Goal 2: Pain level to be no higer than a 6 80% of the day. PT Long Term Goals Time to Complete Long Term Goals: 4 weeks PT Long Term Goal 1: Pt strength to be wnl to allow pt to ambulate without the use of the aircast. PT Long Term Goal 2: Pt SLS to 15 seconds to reduce risk of an ankle sprain Long Term Goal 3: Pain level to be no higher than a 4 80% of the day.  Problem List Patient Active Problem List   Diagnosis Date Noted  . Stiffness of ankle joint 03/31/2013  . Difficulty walking 03/31/2013  . Pain in ankle 03/31/2013  . Ankle fracture 02/08/2013  . Ankle fracture, bimalleolar, closed 12/13/2012    General Behavior During Therapy: [facility] for tasks assessed/performed Cognition: [facility] for tasks performed PT Plan of Care PT Home Exercise Plan: given  GP    Morell Mears,[name] 03/31/2013, 2:46 PM  Physician Documentation Your signature is required to indicate approval of the treatment plan as stated above.  Please sign and either send electronically or make a copy of this report for your files and return this physician signed original.   Please mark one 1.__approve of plan  2. ___approve of plan with the following conditions.   ______________________________                                                          _____________________ Physician Signature                                                                                                             Date

## 2013-04-04 ENCOUNTER — Ambulatory Visit (HOSPITAL_COMMUNITY): Payer: Medicaid Other | Admitting: *Deleted

## 2013-04-05 ENCOUNTER — Ambulatory Visit (HOSPITAL_COMMUNITY)
Admission: RE | Admit: 2013-04-05 | Discharge: 2013-04-05 | Disposition: A | Discharge status: Home / Self Care | Payer: Medicaid Other | Source: Ambulatory Visit | Attending: Orthopedic Surgery | Admitting: Orthopedic Surgery

## 2013-04-05 DIAGNOSIS — R262 Difficulty in walking, not elsewhere classified: Secondary | ICD-10-CM

## 2013-04-05 NOTE — Progress Notes (Signed)
Physical Therapy Treatment Patient Details  Name: Mallory Mendoza MRN: [id number] Date of Birth: 05-Mar-1981  Today's Date: 04/05/2013 Time: 4098-1191 PT Time Calculation (min): 53 min Charge: Gait 8 min, Therex 25' manual 12', Ice x 10  Visit#: 2 of 8  Re-eval: 04/30/13 Assessment Diagnosis: s/p ORIF of Left ankle. Surgical Date: 01/05/13 Next MD Visit: Romeo Apple 06/16/2013 Prior Therapy: none  Authorization: medicaid  Authorization Time Period:    Authorization Visit#:   of     Subjective: Symptoms/Limitations Symptoms: Pt reported compliance with HEP, most difficult exercise has been SLS.  Pt just out of work been stading on LE all day pain scale 3/10.  Swelling increases with WB. Pain Assessment Currently in Pain?: Yes Pain Score:   3 Pain Location: Ankle Pain Orientation: Left  Objective:   Exercise/Treatments  Ankle Exercises - Standing SLS: 18" max of 3 Rocker Board: 3 minutes;Limitations Rocker Board Limitations: initially with HHA then progressed to no HHA Heel Raises: 15 reps Toe Raise: 15 reps Heel Walk (Round Trip):   Other Standing Ankle Exercises: Gait training  160 ft for equal stance phase Other Standing Ankle Exercises: Tandem stance 2x 30", Tandem gait 1RT Ankle Exercises - Seated Towel Crunch: 1 rep Towel Inversion/Eversion: 3 reps Marble Pickup: 2 sets 10 marbles Ankle Exercises - Supine T-Band: 10x each direction Ankle Exercises - Sidelying   Modalities Modalities: Cryotherapy Manual Therapy Manual Therapy: Joint mobilization Edema Management: Retro massage x 8 minutes for edema control. Joint Mobilization: Joint mobs to Lt ankle to improve ankle mobility, ROM and reduce pain. Cryotherapy Number Minutes Cryotherapy: 10 Minutes Cryotherapy Location: Ankle Type of Cryotherapy: Ice pack  Physical Therapy Assessment and Plan PT Assessment and Plan Clinical Impression Statement: Began PT treatment to improve gait mechanincs, strengthening  exercises, balance activities and manual techniques for edema control, to improve ROM and reduce pain.  Pt able to complete therex with min cueing for technique.  Ice applied end of session for pain and edema control.   PT Plan: Progress therex for functional ankle strengthening.  Begin stair training, gait training on TM, balance activities to improve ankle stability and manual techniques/modalities for pain control.    Goals    Problem List Patient Active Problem List   Diagnosis Date Noted  . Stiffness of ankle joint 03/31/2013  . Difficulty walking 03/31/2013  . Pain in ankle 03/31/2013  . Ankle fracture 02/08/2013  . Ankle fracture, bimalleolar, closed 12/13/2012    PT - End of Session Activity Tolerance: Patient tolerated treatment well General Behavior During Therapy: [facility] for tasks assessed/performed Cognition: WFL for tasks performed  GP    Juel Burrow 04/05/2013, 4:47 PM

## 2013-04-11 ENCOUNTER — Telehealth: Payer: Self-pay | Admitting: Orthopedic Surgery

## 2013-04-11 ENCOUNTER — Other Ambulatory Visit: Payer: Self-pay | Admitting: Orthopedic Surgery

## 2013-04-11 ENCOUNTER — Ambulatory Visit (HOSPITAL_COMMUNITY)
Admission: RE | Admit: 2013-04-11 | Discharge: 2013-04-11 | Disposition: A | Discharge status: Home / Self Care | Payer: Medicaid Other | Source: Ambulatory Visit | Attending: Orthopedic Surgery | Admitting: Orthopedic Surgery

## 2013-04-11 DIAGNOSIS — S82892D Other fracture of left lower leg, subsequent encounter for closed fracture with routine healing: Secondary | ICD-10-CM

## 2013-04-11 MED ORDER — OXYCODONE-ACETAMINOPHEN 5-325 MG PO TABS: 1.0000 | ORAL_TABLET | Freq: Four times a day (QID) | ORAL | Status: DC | PRN

## 2013-04-11 NOTE — Telephone Encounter (Signed)
Routing to Dr Harrison 

## 2013-04-11 NOTE — Telephone Encounter (Signed)
Patient called regarding Medication refill request,as per last visit note:  Continue managing with:  "oxyCODONE-acetaminophen (PERCOCET/ROXICET) 5-325 MG per tablet 90 tablet 0 03/23/2013 Take 1 tablet by mouth every 6 (six) hours as needed for pain." Patient's next scheduled appointment is in August 2014.  Please advise.  Her ph# is [phone].  (or if she is to get any other medication - pharmacy is [facility] in [place])

## 2013-04-11 NOTE — Progress Notes (Signed)
Physical Therapy Treatment Patient Details  Name: Mallory Mendoza MRN: [id number] Date of Birth: 05-Jul-1981  Today's Date: 04/11/2013 Time: 4098-1191 PT Time Calculation (min): 38 min  Visit#: 3 of 8  Re-eval: 04/30/13 Charges: Therex x 23' Gait x 10'  Authorization: medicaid    Subjective: Symptoms/Limitations Symptoms: Pt states that her pain is elevated from working today. Pain Assessment Currently in Pain?: Yes Pain Score:   5 Pain Location: Ankle Pain Orientation: Left   Exercise/Treatments Aerobic Exercises Tread Mill: To improve gait mechanics 10'@ 1.2 Ankle Exercises - Standing SLS: 12" max of 3 Rocker Board: 3 minutes;Limitations Rocker Board Limitations: initially with HHA then progressed to no HHA Heel Raises: 15 reps Toe Raise: 15 reps Heel Walk (Round Trip): 2RT Toe Walk (Round Trip): 2RT Other Standing Ankle Exercises: Tandem gait on balance beam 1 RT  Physical Therapy Assessment and Plan PT Assessment and Plan Clinical Impression Statement: Pt displays improved stability with balance activities. Progressed balance activities to dynamic surface with multiple LOB. Pt able to recover independently. Began gait training on treadmill to improve heel-toe pattern. PT Plan: Progress therex for functional ankle strengthening. Continue strengthening, gait, and balance activities to improve ankle stability and manual techniques/modalities for pain control.      Problem List Patient Active Problem List   Diagnosis Date Noted  . Stiffness of ankle joint 03/31/2013  . Difficulty walking 03/31/2013  . Pain in ankle 03/31/2013  . Ankle fracture 02/08/2013  . Ankle fracture, bimalleolar, closed 12/13/2012    PT - End of Session Activity Tolerance: Patient tolerated treatment well General Behavior During Therapy: [facility] for tasks assessed/performed Cognition: WFL for tasks performed  Seth Bake, PTA  04/11/2013, 5:20 PM

## 2013-04-13 ENCOUNTER — Ambulatory Visit (HOSPITAL_COMMUNITY)
Admission: RE | Admit: 2013-04-13 | Discharge: 2013-04-13 | Disposition: A | Discharge status: Home / Self Care | Payer: Medicaid Other | Source: Ambulatory Visit | Attending: Orthopedic Surgery | Admitting: Orthopedic Surgery

## 2013-04-13 DIAGNOSIS — R262 Difficulty in walking, not elsewhere classified: Secondary | ICD-10-CM

## 2013-04-13 NOTE — Progress Notes (Signed)
Physical Therapy Treatment Patient Details  Name: Mallory Mendoza MRN: [id number] Date of Birth: [birth year]  Today's Date: 04/13/2013 Time: 1522-1608 PT Time Calculation (min): 43 min  Visit#: 4 of 4  Re-eval:   Assessment Diagnosis: s/p ORIF of Left ankle. Surgical Date: 01/05/13 Next MD Visit: Romeo Apple 06/16/2013 Prior Therapy: none Charge:  ROM/MMtest 1520-1530; manual [id number]; there ex [id number]; [id number] Authorization: medicaid  Subjective: Symptoms/Limitations Symptoms: Pt states that her ankle swells everyday.   How long can you stand comfortably?: Pt stands for long periods of time at work How long can you walk comfortably?: Pt walks all day at work with the brace. Pain Assessment Currently in Pain?: Yes Pain Score:   6 Pain Location: Ankle Pain Orientation: Left Pain Type: Chronic pain Pain Onset: More than a month ago  Precautions/Restrictions     Exercise/Treatments  Ankle Stretches Soleus Stretch: 2 reps;30 seconds Gastroc Stretch: 3 reps;30 seconds   Ankle Exercises - Standing SLS: 30" max   Manual Therapy Manual Therapy: Other (comment) Other Manual Therapy: PROM, contract/relax to improve DF  Physical Therapy Assessment and Plan PT Assessment and Plan Clinical Impression Statement: PT is at the end of insurance coverage.  Discussed with pt the need to continue exercises and how to slowly wean away from aircast splint.  Pt continues with ROM, strength and proprioception deficits.  PT Plan: D/C to HEP    Goals Home Exercise Program Pt will Perform Home Exercise Program: Independently PT Short Term Goals Time to Complete Short Term Goals: 2 weeks PT Short Term Goal 1: Pt ROM to be WFL to allow pt to have a normalized gait pattern. PT Short Term Goal 1 - Progress: Partly met (Dorsiflextion is still limited) PT Short Term Goal 2: Pain level to be no higer than a 6 80% of the day. (Pt pain will occasionally go to an 8/10 due to work [employer]) PT  Short Term Goal 2 - Progress: Progressing toward goal PT Long Term Goals Time to Complete Long Term Goals: 4 weeks PT Long Term Goal 1: Pt strength to be wnl to allow pt to ambulate without the use of the aircast. PT Long Term Goal 1 - Progress: Progressing toward goal PT Long Term Goal 2: Pt SLS to 15 seconds to reduce risk of an ankle sprain Long Term Goal 3: Pain level to be no higher than a 4 80% of the day. Long Term Goal 3 Progress: Not met  Problem List Patient Active Problem List   Diagnosis Date Noted  . Stiffness of ankle joint 03/31/2013  . Difficulty walking 03/31/2013  . Pain in ankle 03/31/2013  . Ankle fracture 02/08/2013  . Ankle fracture, bimalleolar, closed 12/13/2012    PT Plan of Care PT Home Exercise Plan: HEP revised  GP    Mallory Mendoza,Mallory Mendoza 04/13/2013, 4:56 PM

## 2013-04-13 NOTE — Evaluation (Signed)
Physical Therapy Evaluation  Patient Details  Name: Mallory Mendoza MRN: [id number] Date of Birth: [birth year] Charge:  MM/ROM test, self care, there es  Today's Date: 04/13/2013 Time: 1522-1605 PT Time Calculation (min): 43 min              Visit#: 4 of 4  Assessment Diagnosis: s/p ORIF of Left ankle. Surgical Date: 01/05/13 Next MD Visit: Romeo Apple 06/16/2013 Prior Therapy: none  Authorization: medicaid    Past Medical History:  Past Medical History  Diagnosis Date  . Depression   . Anxiety   . Depression    Past Surgical History:  Past Surgical History  Procedure Laterality Date  . Cesarean section    . Tubal ligation    . Orif ankle fracture  12/16/2012    Procedure: OPEN REDUCTION INTERNAL FIXATION (ORIF) ANKLE FRACTURE;  Surgeon: Vickki Hearing, MD;  Location: [facility];  Service: Orthopedics;  Laterality: Left;  . Cast application Left 01/05/2013    Procedure: CAST APPLICATION;  Surgeon: Vickki Hearing, MD;  Location: [facility];  Service: Orthopedics;  Laterality: Left;    Subjective Symptoms/Limitations Symptoms: Pt states that her ankle swells everyday.   How long can you stand comfortably?: Pt stands for long periods of time at work How long can you walk comfortably?: Pt walks all day at work with the brace. Pain Assessment Currently in Pain?: Yes Pain Score:   6 Pain Location: Ankle Pain Orientation: Left Pain Type: Chronic pain Pain Onset: More than a month ago     Sensation/Coordination/Flexibility/Functional Tests Functional Tests Functional Tests: figure 8 L 52.7 cm . R  50.5 cm ( )  Assessment LLE AROM (degrees) Left Ankle Dorsiflexion: 5 (was 3) Left Ankle Plantar Flexion: 53 (was 40) Left Ankle Inversion: 25 (was 20) Left Ankle Eversion: 10 (was 8) LLE PROM (degrees) Left Ankle Dorsiflexion: 10 LLE Strength Left Ankle Dorsiflexion: 5/5 (was 4/5) Left Ankle Plantar Flexion:  (5-/5 was 4/5) Left Ankle Inversion:  (4+/5 was 5/5) Left Ankle  Eversion:  (4+/5 was 5/5)  Exercise/Treatments    Ankle Stretches Soleus Stretch: 2 reps;30 seconds Gastroc Stretch: 3 reps;30 seconds Ankle Exercises - Standing SLS: 30" max    Manual Therapy Manual Therapy: Other (comment) Other Manual Therapy: PROM, contract/relax to improve DF;  Self care explaining how to wean out of aircast/ importance of improving proprioception of Lt ankle and continued icing.  Physical Therapy Assessment and Plan PT Assessment and Plan Clinical Impression Statement: PT is at the end of insurance coverage.  Discussed with pt the need to continue exercises and how to slowly wean away from aircast splint.  Pt continues with ROM, strength and proprioception deficits.  PT Plan: D/C to HEP    Goals Home Exercise Program Pt will Perform Home Exercise Program: Independently PT Short Term Goals Time to Complete Short Term Goals: 2 weeks PT Short Term Goal 1: Pt ROM to be WFL to allow pt to have a normalized gait pattern. PT Short Term Goal 1 - Progress: Partly met (Dorsiflextion is still limited) PT Short Term Goal 2: Pain level to be no higer than a 6 80% of the day. (Pt pain will occasionally go to an 8/10 due to work [employer]) PT Short Term Goal 2 - Progress: Progressing toward goal PT Long Term Goals Time to Complete Long Term Goals: 4 weeks PT Long Term Goal 1: Pt strength to be wnl to allow pt to ambulate without the use of the aircast. PT Long Term  Goal 1 - Progress: Progressing toward goal PT Long Term Goal 2: Pt SLS to 15 seconds to reduce risk of an ankle sprain Long Term Goal 3: Pain level to be no higher than a 4 80% of the day. Long Term Goal 3 Progress: Not met  Problem List Patient Active Problem List   Diagnosis Date Noted  . Stiffness of ankle joint 03/31/2013  . Difficulty walking 03/31/2013  . Pain in ankle 03/31/2013  . Ankle fracture 02/08/2013  . Ankle fracture, bimalleolar, closed 12/13/2012    PT Plan of Care PT Home Exercise  Plan: HEP revised  GP    RUSSELL,[name] 04/13/2013, 5:02 PM  Physician Documentation Your signature is required to indicate approval of the treatment plan as stated above.  Please sign and either send electronically or make a copy of this report for your files and return this physician signed original.   Please mark one 1.__approve of plan  2. ___approve of plan with the following conditions.   ______________________________                                                          _____________________ Physician Signature                                                                                                             Date

## 2013-04-18 ENCOUNTER — Ambulatory Visit (HOSPITAL_COMMUNITY): Payer: Medicaid Other | Admitting: *Deleted

## 2013-05-15 ENCOUNTER — Telehealth: Payer: Self-pay | Admitting: Orthopedic Surgery

## 2013-05-15 ENCOUNTER — Other Ambulatory Visit: Payer: Self-pay | Admitting: *Deleted

## 2013-05-15 DIAGNOSIS — S82892D Other fracture of left lower leg, subsequent encounter for closed fracture with routine healing: Secondary | ICD-10-CM

## 2013-05-15 MED ORDER — HYDROCODONE-ACETAMINOPHEN 10-325 MG PO TABS: 1.0000 | ORAL_TABLET | ORAL | Status: DC | PRN

## 2013-05-15 NOTE — Telephone Encounter (Signed)
Mallory Mendoza asking for a new Percocet prescription.  She uses [facility] if you should change to something else.  Her # [phone]

## 2013-05-15 NOTE — Telephone Encounter (Signed)
Change to norco 10  1 q 4 prn pain  # 42 with 5 refills

## 2013-05-15 NOTE — Telephone Encounter (Signed)
Sent prescription in to [facility] for Norco 10 one q4 #42 5 refills per Dr. Mort Sawyers reply. Patient aware.

## 2013-06-22 ENCOUNTER — Ambulatory Visit (INDEPENDENT_AMBULATORY_CARE_PROVIDER_SITE_OTHER): Payer: Medicaid Other | Admitting: Orthopedic Surgery

## 2013-06-22 ENCOUNTER — Encounter: Payer: Self-pay | Admitting: Orthopedic Surgery

## 2013-06-22 VITALS — BP 125/89 | Ht 62.0 in | Wt 180.0 lb

## 2013-06-22 DIAGNOSIS — S82842D Displaced bimalleolar fracture of left lower leg, subsequent encounter for closed fracture with routine healing: Secondary | ICD-10-CM

## 2013-06-22 DIAGNOSIS — IMO0001 Reserved for inherently not codable concepts without codable children: Secondary | ICD-10-CM

## 2013-06-22 DIAGNOSIS — S82892D Other fracture of left lower leg, subsequent encounter for closed fracture with routine healing: Secondary | ICD-10-CM

## 2013-06-22 DIAGNOSIS — M25572 Pain in left ankle and joints of left foot: Secondary | ICD-10-CM

## 2013-06-22 DIAGNOSIS — M25673 Stiffness of unspecified ankle, not elsewhere classified: Secondary | ICD-10-CM

## 2013-06-22 DIAGNOSIS — M25579 Pain in unspecified ankle and joints of unspecified foot: Secondary | ICD-10-CM

## 2013-06-22 DIAGNOSIS — M25672 Stiffness of left ankle, not elsewhere classified: Secondary | ICD-10-CM

## 2013-06-22 MED ORDER — HYDROCODONE-ACETAMINOPHEN 10-325 MG PO TABS: 1.0000 | ORAL_TABLET | ORAL | Status: DC | PRN

## 2013-06-22 NOTE — Progress Notes (Signed)
Patient ID: Mallory Mendoza, female   DOB: [birth year], 32 y.o.   MRN: [id number] Chief Complaint  Patient presents with  . Follow-up    3 month recheck on left ankle , ROM. DOS 12-15-12.    There were no vitals taken for this visit.  Encounter Diagnoses  Name Primary?  Marland Kitchen Ankle fracture, bimalleolar, closed, left, with routine healing, subsequent encounter Yes  . Stiffness of ankle joint, left   . Pain in ankle, left    Pain swelling at the end of the day after standing at work  BP 125/89  Ht 5\' 2"  (1.575 m)  Wt 180 lb (81.647 kg)  BMI 32.91 kg/m2 General appearance is normal, the patient is alert and oriented x3 with normal mood and affect. Gait normal Ankle range of motion plantarflexion 15 dorsiflexion 15 Ankle stability normal Achilles tendon nontender Scars well-healed  Status post OTIF  left ankle did well  Followup when necessary take Norco 10 for pain   Physical Therapy Assessment and Plan PT Assessment and Plan Clinical Impression Statement: PT is at the end of insurance coverage.  Discussed with pt the need to continue exercises and how to slowly wean away from aircast splint.  Pt continues with ROM, strength and proprioception deficits.   PT Plan: D/C to HEP

## 2013-06-22 NOTE — Patient Instructions (Addendum)
Continue home exercises Take medication as needed for pain  The ankle will improve up to one year

## 2013-06-29 ENCOUNTER — Telehealth: Payer: Self-pay | Admitting: Orthopedic Surgery

## 2013-06-29 NOTE — Telephone Encounter (Signed)
Ok   Fix it the way she wants it

## 2013-06-29 NOTE — Telephone Encounter (Signed)
Patient called to request out of work note for 06/28/13 - states ankle had been sore with a little swelling - said rest, ice, and elevation relieved. 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  Notes entered, Dr. Romeo Apple to review and advise.  Please call patient as to when she can pick up the 2 notes.  Ph [phone].

## 2013-06-29 NOTE — Telephone Encounter (Signed)
Work notes done, as noted, per Dr.  Romeo Apple -patient aware ready for pickup.

## 2013-07-12 ENCOUNTER — Ambulatory Visit (INDEPENDENT_AMBULATORY_CARE_PROVIDER_SITE_OTHER): Payer: Medicaid Other | Admitting: Family Medicine

## 2013-07-12 ENCOUNTER — Encounter: Payer: Self-pay | Admitting: Family Medicine

## 2013-07-12 VITALS — BP 138/94 | Temp 98.8°F | Wt 181.1 lb

## 2013-07-12 DIAGNOSIS — M25572 Pain in left ankle and joints of left foot: Secondary | ICD-10-CM

## 2013-07-12 DIAGNOSIS — M25579 Pain in unspecified ankle and joints of unspecified foot: Secondary | ICD-10-CM

## 2013-07-12 DIAGNOSIS — F32A Depression, unspecified: Secondary | ICD-10-CM

## 2013-07-12 DIAGNOSIS — F329 Major depressive disorder, single episode, unspecified: Secondary | ICD-10-CM

## 2013-07-12 DIAGNOSIS — G4700 Insomnia, unspecified: Secondary | ICD-10-CM

## 2013-07-12 MED ORDER — DIAZEPAM 5 MG PO TABS: ORAL_TABLET | ORAL | Status: DC

## 2013-07-12 MED ORDER — NAPROXEN 500 MG PO TABS: 500.0000 mg | ORAL_TABLET | Freq: Two times a day (BID) | ORAL | Status: DC

## 2013-07-12 MED ORDER — FLUOXETINE HCL 20 MG PO CAPS
20.0000 mg | ORAL_CAPSULE | Freq: Every day | ORAL | Status: DC
Start: 2013-07-12 — End: 2016-03-01

## 2013-07-12 NOTE — Progress Notes (Signed)
Pt here to re-establish care and discuss meds.   CC - (3 chronic issues today) insomnia (chronic), depression(chronic), ankle pain   HPI -  1 - insomnia - longstanding, takes 1mg  lorazepam at night for sleep. She has tried [place] and several others in the past that she cannot think of now. Lorazepam is the only one that seems to work for her. She has not had any sleep studies or sleep evaluations. A friend gave her xanax recently which also helped. She has difficulty both falling asleep ans staying asleep if she is not medicated. She drinks caffeine until 2pm, goes to bed at 10pm, wakes up at 6am daily. No alcohol.   2 - depression - also longstanding. Prozac works well for her. No significant side effects.   3 - Ankle pain - broke ankle in jnauary and required surgery. She saw ortho thorugh 06/23/13 and has now been dismissed. She also did PT for about 1.5 weeks and then stopped due to insurance coverage. She c/o pain continuing.   ROS -  pos: difficulty sleeping, ankle pain Neg - currently feeling depressed, wanting to hurt self or others, night sweats, shob, chest pain, abd pain, urinary sx, edema, rashes, fevers    PE Physical Examination: General appearance - alert, well appearing, and in no distress Mental status - alert, oriented to person, place, and time, normal mood, behavior, speech, dress, motor activity, and thought processes Chest - clear to auscultation, no wheezes, rales or rhonchi, symmetric air entry Heart - normal rate, regular rhythm, normal S1, S2, no murmurs, rubs, clicks or gallops Left ankle - ttp over medial and lateral malleolus, mod decreased rom, cp wnl and nv intact  A/P  1 - insolmnia - sleep studies referral, refill valium for next 2 mos while we obtain information. May also beenfit form behavioural health referral but will see what sleep study shows first. Pt given handout on our narcotics policy and she understands that unless we are working with a specialist,  will not be prescribing long term benzos.   2 - depression - doing well, refilled prozac for 6 mos and will see her at that time. Discussed warning signs or worsening condition and who to call for help.   3 - ankle pain - will try naproxen as she was having some relief from iburpfen but did not like taking so many pills. Discussed that if pain persists she can let ortho know, and/or we can refer to pain mngmt. We will not be prescribing narcotics for this problem. She relays that ultram has made her sick in the past.   rtc 4-6 weeks for f/u on insomnia and ankle pain, earlier if needed

## 2013-07-12 NOTE — Patient Instructions (Signed)

## 2013-08-16 ENCOUNTER — Other Ambulatory Visit: Payer: Self-pay | Admitting: Family Medicine

## 2013-08-16 ENCOUNTER — Ambulatory Visit (INDEPENDENT_AMBULATORY_CARE_PROVIDER_SITE_OTHER): Payer: Medicaid Other | Admitting: Family Medicine

## 2013-08-16 ENCOUNTER — Telehealth: Payer: Self-pay | Admitting: Family Medicine

## 2013-08-16 ENCOUNTER — Encounter: Payer: Self-pay | Admitting: Family Medicine

## 2013-08-16 VITALS — BP 118/78 | Temp 98.1°F | Wt 179.4 lb

## 2013-08-16 DIAGNOSIS — M79605 Pain in left leg: Secondary | ICD-10-CM

## 2013-08-16 DIAGNOSIS — M79609 Pain in unspecified limb: Secondary | ICD-10-CM

## 2013-08-16 DIAGNOSIS — G4700 Insomnia, unspecified: Secondary | ICD-10-CM

## 2013-08-16 MED ORDER — ALPRAZOLAM 2 MG PO TABS: 2.0000 mg | ORAL_TABLET | Freq: Every evening | ORAL | Status: DC | PRN

## 2013-08-16 NOTE — Telephone Encounter (Signed)
OK thanks ----- Message ----- From: Margaretha Sheffield Sent: 08/16/2013 8:59 AM To: Acey Lav, MD Patient doesn't want venous duplex schedule, states that she wants to call the MD that did her surgery first.

## 2013-08-16 NOTE — Progress Notes (Signed)
  Subjective:    Patient ID: Mallory Mendoza, female    DOB: October 29, 1981, 32 y.o.   MRN: [id number]  HPI Pt here for f/u on insomnia and ankle pain.   Insomnia - long h/o difficulty sleeping, 2-3mg  xanax before bed has been the only thing she has found to be helpful. She relays that she has several family members that also need sleeping medications. See prior notes for sleep hygeine (no red flags). This past month we tried valium but she says it did not work. I had placed a referral for a sleep study but there was some sort of computer glitch and she was not able to get it done - says she called but they could not see the referral.   Ankle pain - had fx in February. Continues to have pain. Last month i directed her to discuss all pain issues regarding the ankle to her ortho team. Today she is concerned about a "knot" that is painful in the soft tissue area of the medial left ankle. It hurts constantly, but waxes and wanes in serevity. Hurts to walk. She is a smoker but is not on birth control and has no personal history of clots.   Review of Systems per hpi     Objective:   Physical Exam Nursing note and vitals reviewed. Constitutional: She is oriented to person, place, and time. She appears well-developed and well-nourished.  Cardiovascular: Normal rate, regular rhythm and normal heart sounds.   Pulmonary/Chest: Effort normal and breath sounds normal.  Neurological: She is alert and oriented to person, place, and time. She has normal reflexes.  Skin: Skin is warm and dry.  Psychiatric: She has a normal mood and affect. Her behavior is normal.  Left ankle - nickel sized firm area posterior to medial left meleolus. No calf ttm, swelling, or erythema.        Assessment & Plan:  Insomnia - spoke with Kenney Houseman, who is working to get pt in for sleep study this month. Appologized to pt for the "glitch". 1 month of xanax given, although emphasized this is not a medicine we will continue to  prescribe. Will see what SS shows. If not diagnostic, will refer to [facility] to discuss benzos or other management of insomnia. Pt aware.   Ankle pain - will get a venous duplex to r/o dvt given h/o surgery and smoking although sx are not typical for dvt. Beyond that, ankle pain issues need to be directed to ortho.   rtc 3 mos for cpe

## 2013-08-17 ENCOUNTER — Telehealth: Payer: Self-pay | Admitting: Orthopedic Surgery

## 2013-08-17 ENCOUNTER — Emergency Department (HOSPITAL_COMMUNITY): Payer: Medicaid Other

## 2013-08-17 ENCOUNTER — Emergency Department (HOSPITAL_COMMUNITY)
Admission: EM | Admit: 2013-08-17 | Discharge: 2013-08-17 | Disposition: A | Discharge status: Home / Self Care | Payer: Medicaid Other | Attending: Emergency Medicine | Admitting: Emergency Medicine

## 2013-08-17 ENCOUNTER — Encounter (HOSPITAL_COMMUNITY): Payer: Self-pay | Admitting: *Deleted

## 2013-08-17 DIAGNOSIS — T8484XA Pain due to internal orthopedic prosthetic devices, implants and grafts, initial encounter: Secondary | ICD-10-CM

## 2013-08-17 DIAGNOSIS — Y849 Medical procedure, unspecified as the cause of abnormal reaction of the patient, or of later complication, without mention of misadventure at the time of the procedure: Secondary | ICD-10-CM | POA: Insufficient documentation

## 2013-08-17 DIAGNOSIS — G8929 Other chronic pain: Secondary | ICD-10-CM

## 2013-08-17 DIAGNOSIS — Z79899 Other long term (current) drug therapy: Secondary | ICD-10-CM | POA: Insufficient documentation

## 2013-08-17 DIAGNOSIS — F17200 Nicotine dependence, unspecified, uncomplicated: Secondary | ICD-10-CM | POA: Insufficient documentation

## 2013-08-17 DIAGNOSIS — F411 Generalized anxiety disorder: Secondary | ICD-10-CM | POA: Insufficient documentation

## 2013-08-17 DIAGNOSIS — F329 Major depressive disorder, single episode, unspecified: Secondary | ICD-10-CM | POA: Insufficient documentation

## 2013-08-17 DIAGNOSIS — T8489XA Other specified complication of internal orthopedic prosthetic devices, implants and grafts, initial encounter: Secondary | ICD-10-CM | POA: Insufficient documentation

## 2013-08-17 DIAGNOSIS — G8928 Other chronic postprocedural pain: Secondary | ICD-10-CM | POA: Insufficient documentation

## 2013-08-17 DIAGNOSIS — F3289 Other specified depressive episodes: Secondary | ICD-10-CM | POA: Insufficient documentation

## 2013-08-17 MED ORDER — HYDROCODONE-ACETAMINOPHEN 5-325 MG PO TABS: 1.0000 | ORAL_TABLET | ORAL | Status: DC | PRN

## 2013-08-17 NOTE — Telephone Encounter (Signed)
This one s easy   1. Get primary care referral   2. Then make appointment   3. Work note can not be given if not seen here

## 2013-08-17 NOTE — ED Provider Notes (Signed)
Medical screening examination/treatment/procedure(s) were performed by non-physician practitioner and as supervising physician I was immediately available for consultation/collaboration.   Glynn Octave, MD 08/17/13 [phone]

## 2013-08-17 NOTE — Telephone Encounter (Signed)
Patient called today regarding left ankle pain and "knot", for which she was seen by her primary care physician, Dr. Bevelyn Ngo and Irish Lack at [facility]; patient is requesting appointment as soon as possible.  Patient had fracture repair surgery 12/16/12.  -Primary care notes indicate referral back to Dr. Romeo Apple, as well as mention of CT scan; patient is not aware of any pending testing.  Insurance, [employer], requires referral from primary care; offered appointment for first available for when Dr. Romeo Apple is in office, Tuesday, 08/22/13.  -Patient is also requesting a work note, due to having difficulty staying at work today, 08/17/13; states primary care was able to issue a note for yesterday, day of her appointment only.     -Please review patient's notes and please advise.  Patient ph# [phone].

## 2013-08-17 NOTE — Telephone Encounter (Addendum)
Called back to patient, relayed, and in process,  (see next note, continued)

## 2013-08-17 NOTE — ED Notes (Signed)
States she had surgery on her left ankle in January, states she has had pain in ankle for the past 2 days

## 2013-08-17 NOTE — ED Provider Notes (Signed)
CSN: [id number]     Arrival date & time 08/17/13  1715 History   First MD Initiated Contact with Patient 08/17/13 1746     Chief Complaint  Patient presents with  . Ankle Pain   (Consider location/radiation/quality/duration/timing/severity/associated sxs/prior Treatment) HPI Comments: Mallory Mendoza is a 32 y.o. Female presenting with increasing right ankle pain and swelling which worsens when at work,  As she works in [employer] and stands for long periods of time.  She had an ankle fracture with internal fixation surgical repair 1/14 by Dr. Romeo Apple and is concerned for a possible problem with the hardware causing her symptoms.  She denies any new injury and last saw Dr. Romeo Apple 2 months ago at which time her exam was normal despite persistent pain.  She has noticed a "bump" at her medial ankle which is sore and new over the past few weeks.  Elevation improves her pain.  She has been taking hydrocodone prescribed by Dr. Romeo Apple but ran out early this week.  She is scheduled to see him in 5 days for a recheck of her complaint.     The history is provided by the patient.    Past Medical History  Diagnosis Date  . Depression   . Anxiety   . Depression    Past Surgical History  Procedure Laterality Date  . Cesarean section    . Tubal ligation    . Orif ankle fracture  12/16/2012    Procedure: OPEN REDUCTION INTERNAL FIXATION (ORIF) ANKLE FRACTURE;  Surgeon: Vickki Hearing, MD;  Location: [facility];  Service: Orthopedics;  Laterality: Left;  . Cast application Left 01/05/2013    Procedure: CAST APPLICATION;  Surgeon: Vickki Hearing, MD;  Location: [facility];  Service: Orthopedics;  Laterality: Left;   No family history on file. History  Substance Use Topics  . Smoking status: Current Every Day Smoker -- 1.00 packs/day for 12 years    Types: Cigarettes  . Smokeless tobacco: Not on file  . Alcohol Use: Yes     Comment: occ   OB History   Grav Para Term Preterm Abortions TAB SAB  Ect Mult Living                 Review of Systems  Constitutional: Negative for fever.  Cardiovascular: Negative for leg swelling.  Musculoskeletal: Positive for arthralgias. Negative for myalgias and joint swelling.  Skin: Negative for color change and wound.  Neurological: Negative for weakness and numbness.    Allergies  Review of patient's allergies indicates no known allergies.  Home Medications   Current Outpatient Rx  Name  Route  Sig  Dispense  Refill  . alprazolam (XANAX) 2 MG tablet   Oral   Take 1 tablet (2 mg total) by mouth at bedtime as needed for sleep.   30 tablet   0   . FLUoxetine (PROZAC) 20 MG capsule   Oral   Take 1 capsule (20 mg total) by mouth daily.   30 capsule   5   . HYDROcodone-acetaminophen (NORCO) 10-325 MG per tablet   Oral   Take 1 tablet by mouth every 4 (four) hours as needed for pain.   90 tablet   5   . HYDROcodone-acetaminophen (NORCO/VICODIN) 5-325 MG per tablet   Oral   Take 1 tablet by mouth every 4 (four) hours as needed for pain.   30 tablet   0   . naproxen (NAPROSYN) 500 MG tablet   Oral  Take 1 tablet (500 mg total) by mouth 2 (two) times daily with a meal.   60 tablet   2   . oxyCODONE-acetaminophen (PERCOCET/ROXICET) 5-325 MG per tablet   Oral   Take 1 tablet by mouth every 6 (six) hours as needed for pain.   90 tablet   0    BP 149/93  Pulse 85  Temp(Src) 98.5 F (36.9 C)  Resp 18  Ht 5\' 4"  (1.626 m)  Wt 179 lb (81.194 kg)  BMI 30.71 kg/m2  SpO2 99%  LMP 08/17/2013 Physical Exam  Nursing note and vitals reviewed. Constitutional: She appears well-developed and well-nourished.  HENT:  Head: Normocephalic.  Cardiovascular: Normal rate and intact distal pulses.  Exam reveals no decreased pulses.   Pulses:      Dorsalis pedis pulses are 2+ on the right side, and 2+ on the left side.       Posterior tibial pulses are 2+ on the right side, and 2+ on the left side.  Musculoskeletal: She exhibits  tenderness.       Right ankle: She exhibits swelling.       Left ankle: She exhibits swelling. She exhibits no ecchymosis, no deformity, no laceration and normal pulse. Tenderness. Achilles tendon normal.  TTP along left medial ankle with a small palpable subcutaneous swelling, skin intact, no erythema.  Pedal pulses normal.  Distal cap refill less than 3 sec.  Neurological: She is alert. No sensory deficit.  Skin: Skin is warm, dry and intact.    ED Course  Procedures (including critical care time) Labs Review Labs Reviewed - No data to display Imaging Review Dg Ankle Complete Left  08/17/2013   *RADIOLOGY REPORT*  Clinical Data: Left medial ankle pain.  LEFT ANKLE COMPLETE - 3+ VIEW  Comparison: Ankle series 03/23/2013  Findings: Lateral plate and screw fixation of the distal fibula as well as multiple K pins within the distal tibia. There has been interval medial migration of the K-pin piece from the lateral tibial plate and screw fixation hardware.  Remainder of the hardware appears intact.  No evidence for acute fracture or dislocation.  Regional soft tissues unremarkable.  IMPRESSION: 1. Migration of a portion of the K-pin lateral and posterior when compared to prior examinations.  This piece originally was adjacent to the lateral tibial plate and screw fixation hardware. 2.  No evidence for acute fracture.   Original Report Authenticated By: Annia Belt, M.D    MDM   1. Ankle pain, chronic, left   2. Painful orthopaedic hardware, initial encounter    xrays reviewed and discussed and shown pt.  Encouraged f/u with Dr Romeo Apple next week as scheduled.  Minimize weight bearing, standing (has crutches).  Prescribed hydrocodone.  K pin migration,  Probable cause of pain.    Burgess Amor, PA-C 08/17/13 [phone]

## 2013-08-17 NOTE — Telephone Encounter (Signed)
Called back to patient; she states she went on the Emergency Room at [facility], has just gotten out from visit there, States Xrays were done on left ankle, and that "something is sticking out" and that she is to see Dr. Romeo Apple.  Please review and advise.

## 2013-08-18 ENCOUNTER — Other Ambulatory Visit: Payer: Self-pay | Admitting: Family Medicine

## 2013-08-18 DIAGNOSIS — M25579 Pain in unspecified ankle and joints of unspecified foot: Secondary | ICD-10-CM

## 2013-08-18 NOTE — Telephone Encounter (Signed)
Last note   What advice???  Get the referral as needed per [facility]  Make appointment

## 2013-08-18 NOTE — Progress Notes (Signed)
I received the ED summary note today in my inbox for this pt and reviewed this note as well as the recent phone call notes. I gather from those that she needs a referral from [place] to see ortho again so am placing one now. Let me know if anything else is needed. Thanks AW.

## 2013-08-19 ENCOUNTER — Encounter (HOSPITAL_COMMUNITY): Payer: Self-pay | Admitting: Emergency Medicine

## 2013-08-19 ENCOUNTER — Emergency Department (HOSPITAL_COMMUNITY)
Admission: EM | Admit: 2013-08-19 | Discharge: 2013-08-19 | Disposition: A | Discharge status: Home / Self Care | Payer: MEDICAID | Attending: Emergency Medicine | Admitting: Emergency Medicine

## 2013-08-19 DIAGNOSIS — F329 Major depressive disorder, single episode, unspecified: Secondary | ICD-10-CM | POA: Insufficient documentation

## 2013-08-19 DIAGNOSIS — F411 Generalized anxiety disorder: Secondary | ICD-10-CM | POA: Insufficient documentation

## 2013-08-19 DIAGNOSIS — E876 Hypokalemia: Secondary | ICD-10-CM | POA: Insufficient documentation

## 2013-08-19 DIAGNOSIS — F17200 Nicotine dependence, unspecified, uncomplicated: Secondary | ICD-10-CM | POA: Insufficient documentation

## 2013-08-19 DIAGNOSIS — F3289 Other specified depressive episodes: Secondary | ICD-10-CM | POA: Insufficient documentation

## 2013-08-19 DIAGNOSIS — F1010 Alcohol abuse, uncomplicated: Secondary | ICD-10-CM | POA: Insufficient documentation

## 2013-08-19 DIAGNOSIS — Z79899 Other long term (current) drug therapy: Secondary | ICD-10-CM | POA: Insufficient documentation

## 2013-08-19 DIAGNOSIS — F10929 Alcohol use, unspecified with intoxication, unspecified: Secondary | ICD-10-CM

## 2013-08-19 LAB — CBC WITH DIFFERENTIAL/PLATELET
Basophils Absolute: 0 10*3/uL (ref 0.0–0.1)
Basophils Relative: 0 % (ref 0–1)
Eosinophils Absolute: 0.2 10*3/uL (ref 0.0–0.7)
Eosinophils Relative: 3 % (ref 0–5)
HCT: 46.5 % — ABNORMAL HIGH (ref 36.0–46.0)
Hemoglobin: 16.1 g/dL — ABNORMAL HIGH (ref 12.0–15.0)
Lymphocytes Relative: 35 % (ref 12–46)
Lymphs Abs: 3 10*3/uL (ref 0.7–4.0)
MCH: 34.6 pg — ABNORMAL HIGH (ref 26.0–34.0)
MCHC: 34.6 g/dL (ref 30.0–36.0)
MCV: 100 fL (ref 78.0–100.0)
Monocytes Absolute: 0.6 10*3/uL (ref 0.1–1.0)
Monocytes Relative: 7 % (ref 3–12)
Neutro Abs: 4.9 10*3/uL (ref 1.7–7.7)
Neutrophils Relative %: 56 % (ref 43–77)
Platelets: 259 10*3/uL (ref 150–400)
RBC: 4.65 MIL/uL (ref 3.87–5.11)
RDW: 13.2 % (ref 11.5–15.5)
WBC: 8.7 10*3/uL (ref 4.0–10.5)

## 2013-08-19 LAB — ETHANOL: Alcohol, Ethyl (B): 299 mg/dL — ABNORMAL HIGH (ref 0–11)

## 2013-08-19 LAB — ACETAMINOPHEN LEVEL: Acetaminophen (Tylenol), Serum: <15 ug/mL (ref 10–30)

## 2013-08-19 LAB — RAPID URINE DRUG SCREEN, HOSP PERFORMED
Amphetamines: NOT DETECTED
Barbiturates: NOT DETECTED
Benzodiazepines: POSITIVE — AB
Cocaine: NOT DETECTED
Opiates: NOT DETECTED
Tetrahydrocannabinol: NOT DETECTED

## 2013-08-19 LAB — BASIC METABOLIC PANEL
BUN: 8 mg/dL (ref 6–23)
CO2: 25 mEq/L (ref 19–32)
Calcium: 10.1 mg/dL (ref 8.4–10.5)
Chloride: 101 mEq/L (ref 96–112)
Creatinine, Ser: 0.9 mg/dL (ref 0.50–1.10)
GFR calc Af Amer: >90 mL/min (ref >90)
GFR calc non Af Amer: 84 mL/min — ABNORMAL LOW (ref >90)
Glucose, Bld: 91 mg/dL (ref 70–99)
Potassium: 3.1 mEq/L — ABNORMAL LOW (ref 3.5–5.1)
Sodium: 139 mEq/L (ref 135–145)

## 2013-08-19 LAB — SALICYLATE LEVEL: Salicylate Lvl: <2 mg/dL — ABNORMAL LOW (ref 2.8–20.0)

## 2013-08-19 MED ORDER — POTASSIUM CHLORIDE CRYS ER 20 MEQ PO TBCR: 20.0000 meq | EXTENDED_RELEASE_TABLET | Freq: Two times a day (BID) | ORAL | Status: DC

## 2013-08-19 MED ORDER — SODIUM CHLORIDE 0.9 % IV SOLN
Freq: Once | INTRAVENOUS | Status: AC
  Administered 2013-08-19: 22:00:00 via INTRAVENOUS

## 2013-08-19 MED ORDER — SODIUM CHLORIDE 0.9 % IV SOLN: INTRAVENOUS | Status: DC

## 2013-08-19 NOTE — ED Notes (Signed)
Pt family member called ems for pt who has been drinking heavily and also took some xanax with same.  Ems reports pt was conversational with them en route and now will not speak with e.r. staff

## 2013-08-19 NOTE — ED Notes (Signed)
Pt questioned about xanax 2mg , was filled 10/2, 30 tablets, 19 1/2 in the bottle. Pt states she will take 1/2 during the day if she needs it. Denies taking more than one tonight. Daughter and friend at the bedside, states she has a problem drinking, but does not usually overdose. Pt is being treated for depression. Pt is single mother of 4 children.

## 2013-08-19 NOTE — ED Notes (Signed)
Pt ambulatory to bathroom with assistance, pt unsteady, family stated with pt in bathroom. Pt back on bed, on monitor, iv infusing

## 2013-08-19 NOTE — ED Notes (Signed)
Pt states she is going to leave, husband at the bedside, states he will stay with pt tonight. Pt is up getting dressed. MD aware.

## 2013-08-19 NOTE — ED Provider Notes (Signed)
CSN: [id number]     Arrival date & time 08/19/13  2124 History  This chart was scribed for Donnetta Hutching, MD by Ardelia Mems, ED Scribe. This patient was seen in room [facility]14/[facility]14 and the patient's care was started at 10:17 PM.     Chief Complaint  Patient presents with  . Alcohol Intoxication    The history is provided by the patient. No language interpreter was used.   HPI Comments: EMMYLOU BIEKER is a 32 y.o. Female with a history of depression and anxiety who presents to the Emergency Department after using alcohol and Xanax PTA. She states that she took 1 mg of her prescribed Xanax tonight, and drank 1.5 beers tonight. Pt states that she lost consciousness after using alcohol and Xanax. Husband is in the room and states that he did not witness pt take Xanax, use alcohol or lose consciousness tonight. Pt states that she was not depressed and she did not have suicidal intent. She states that she has taken Xanax with alcohol in the past, without any similar symptoms. Husband states that pt "knows better" than to use alcohol and Xanax together, but that she is stubborn. She denies any pain or symtpoms currently. She is a current every day smoker of 1 pack/day of 12 years.  PCP- Dr. Irish Lack   Past Medical History  Diagnosis Date  . Depression   . Anxiety   . Depression    Past Surgical History  Procedure Laterality Date  . Cesarean section    . Tubal ligation    . Orif ankle fracture  12/16/2012    Procedure: OPEN REDUCTION INTERNAL FIXATION (ORIF) ANKLE FRACTURE;  Surgeon: Vickki Hearing, MD;  Location: [facility];  Service: Orthopedics;  Laterality: Left;  . Cast application Left 01/05/2013    Procedure: CAST APPLICATION;  Surgeon: Vickki Hearing, MD;  Location: [facility];  Service: Orthopedics;  Laterality: Left;   No family history on file. History  Substance Use Topics  . Smoking status: Current Every Day Smoker -- 1.00 packs/day for 12 years    Types: Cigarettes  .  Smokeless tobacco: Not on file  . Alcohol Use: Yes     Comment: occ   OB History   Grav Para Term Preterm Abortions TAB SAB Ect Mult Living                 Review of Systems A complete 10 system review of systems was obtained and all systems are negative except as noted in the HPI and PMH.   Allergies  Review of patient's allergies indicates no known allergies.  Home Medications   Current Outpatient Rx  Name  Route  Sig  Dispense  Refill  . alprazolam (XANAX) 2 MG tablet   Oral   Take 1 tablet (2 mg total) by mouth at bedtime as needed for sleep.   30 tablet   0   . FLUoxetine (PROZAC) 20 MG capsule   Oral   Take 1 capsule (20 mg total) by mouth daily.   30 capsule   5   . HYDROcodone-acetaminophen (NORCO/VICODIN) 5-325 MG per tablet   Oral   Take 1 tablet by mouth every 4 (four) hours as needed for pain.   30 tablet   0   . ibuprofen (ADVIL,MOTRIN) 800 MG tablet   Oral   Take 800 mg by mouth every 8 (eight) hours as needed for pain.          Triage Vitals: BP  132/96  Pulse 99  Temp(Src) 98 F (36.7 C) (Oral)  Resp 18  SpO2 99%  LMP 08/17/2013  Physical Exam  Nursing note and vitals reviewed. Constitutional: She is oriented to person, place, and time. She appears well-developed and well-nourished.  Lucid. Answering question appropriately.  HENT:  Head: Normocephalic and atraumatic.  Eyes: Conjunctivae and EOM are normal. Pupils are equal, round, and reactive to light.  Neck: Normal range of motion. Neck supple.  Cardiovascular: Normal rate, regular rhythm and normal heart sounds.   Pulmonary/Chest: Effort normal and breath sounds normal.  Abdominal: Soft. Bowel sounds are normal.  Musculoskeletal: Normal range of motion.  Neurological: She is alert and oriented to person, place, and time.  Skin: Skin is warm and dry.  Psychiatric: She has a normal mood and affect.    ED Course  Procedures (including critical care time)  DIAGNOSTIC  STUDIES: Oxygen Saturation is 99% on RA, normal by my interpretation.    COORDINATION OF CARE: 10:22 PM- Counseled pt on the risks of using alcohol and Xanax, both CNS depressants, together. Advised pt never to mix these 2 again. Will observe pt for a bried period of time. Discussed plan to obtain diagnostic lab work. Pt advised of plan for treatment and pt agrees.  Medications  0.9 %  sodium chloride infusion ( Intravenous Stopped 08/19/13 2256)   Labs Review Labs Reviewed  BASIC METABOLIC PANEL - Abnormal; Notable for the following:    Potassium 3.1 (*)    GFR calc non Af Amer 84 (*)    All other components within normal limits  CBC WITH DIFFERENTIAL - Abnormal; Notable for the following:    Hemoglobin 16.1 (*)    HCT 46.5 (*)    MCH 34.6 (*)    All other components within normal limits  ETHANOL - Abnormal; Notable for the following:    Alcohol, Ethyl (B) 299 (*)    All other components within normal limits  SALICYLATE LEVEL - Abnormal; Notable for the following:    Salicylate Lvl <2.0 (*)    All other components within normal limits  ACETAMINOPHEN LEVEL  URINE RAPID DRUG SCREEN (HOSP PERFORMED)   Imaging Review No results found.  MDM   1. Alcohol intoxication   2. Hypokalemia    Patient is ambulatory.  No neuro deficits. No S/H ideation.   Husband will take her home   I personally performed the services described in this documentation, which was scribed in my presence. The recorded information has been reviewed and is accurate.    Donnetta Hutching, MD 08/19/13 2306

## 2013-08-19 NOTE — ED Notes (Signed)
Pt speaking oriented. Pt states she drank 2 beers and took one xanax, EMT at the bedside to get IV. Pt denies wanting to hurt herself.

## 2013-08-19 NOTE — ED Notes (Signed)
Patient given discharge instruction, verbalized understand. IV removed, band aid applied. Patient ambulatory out of the department with daughter

## 2013-08-21 NOTE — Telephone Encounter (Signed)
Referral received, appointment scheduled and confirmed for tomorrow, 08/22/13, 2:00p.m; patient and primary care notified.

## 2013-08-22 ENCOUNTER — Ambulatory Visit (INDEPENDENT_AMBULATORY_CARE_PROVIDER_SITE_OTHER): Payer: Medicaid Other | Admitting: Orthopedic Surgery

## 2013-08-22 ENCOUNTER — Encounter: Payer: Self-pay | Admitting: Orthopedic Surgery

## 2013-08-22 VITALS — BP 144/104 | Ht 64.0 in | Wt 179.0 lb

## 2013-08-22 DIAGNOSIS — IMO0001 Reserved for inherently not codable concepts without codable children: Secondary | ICD-10-CM

## 2013-08-22 DIAGNOSIS — M25579 Pain in unspecified ankle and joints of unspecified foot: Secondary | ICD-10-CM

## 2013-08-22 DIAGNOSIS — M25572 Pain in left ankle and joints of left foot: Secondary | ICD-10-CM

## 2013-08-22 DIAGNOSIS — S82842D Displaced bimalleolar fracture of left lower leg, subsequent encounter for closed fracture with routine healing: Secondary | ICD-10-CM

## 2013-08-22 NOTE — Patient Instructions (Addendum)
Surgery hardware removal left ankle   Will be OOW 2 weeks from surgery date

## 2013-08-23 ENCOUNTER — Other Ambulatory Visit: Payer: Self-pay | Admitting: *Deleted

## 2013-08-23 ENCOUNTER — Encounter: Payer: Self-pay | Admitting: Orthopedic Surgery

## 2013-08-23 ENCOUNTER — Encounter (HOSPITAL_COMMUNITY): Payer: Self-pay | Admitting: Pharmacy Technician

## 2013-08-23 NOTE — Progress Notes (Addendum)
Patient ID: Mallory Mendoza, female   DOB: 1981-09-11, 32 y.o.   MRN: [id number]  Chief Complaint  Patient presents with  . Ankle Pain    Left ankle pain, thinks pin has come loose   HISTORY: 32 year old female had fracture of the left ankle with open treatment internal fixation bimalleolar fracture lateral plate and screw construct and medial K wires January 2014. The patient did well initially with her surgical treatment except for the fact we had to place her in a cast to get adequate ankle dorsiflexion. She went back to work has had persistent swelling and occasional pain which increased back on October 1. Her pain is over the medial aspect of the ankle and didn't just proximal to that posteriorly one of the K wires has migrated into the soft tissue. Pain levels are 7/10 described as sharp. The swelling occurs when she's standing and walking. She denies any numbness or tingling  Review of systems is negative except for the swelling and stiffness in the ankle.  She has a history of depression  She's had 3 cesarean sections tubal ligation she had an abscess drained  Current medication Prozac Xanax hydrocodone and ibuprofen family history of arthritis social history she is single she works as a Conservation officer, nature she smokes a pack cigarettes a day she drinks minimal alcohol she does use caffeinated beverages and high school education was completed through the 11th grade  Blood pressure 144/104, height 5\' 4"  (1.626 m), weight 179 lb (81.194 kg), last menstrual period 08/17/2013. General appearance is normal she is alert and oriented x3 her mood and affect are pleasant she walks with no significant limp but on careful examination decreased dorsiflexion at the ankle joint  Upper extremity exam  The right and left upper extremity:   Inspection and palpation revealed no abnormalities in the upper extremities.   Range of motion is full without contracture.  Motor exam is normal with grade 5  strength.  The joints are fully reduced without subluxation.  There is no atrophy or tremor and muscle tone is normal.  All joints are stable.   Right lower extremity   Inspection and palpation revealed no abnormalities in the upper extremities.   Range of motion is full without contracture.  Motor exam is normal with grade 5 strength.  The joints are fully reduced without subluxation.  There is no atrophy or tremor and muscle tone is normal.  All joints are stable.  Left lower extremity medial lateral incisions noted tenderness over the medial incision and swelling inferior to the medial malleolus 4 cm proximal to this the K wire is noted tenting the skin. No signs of infection dorsiflexion limited to 5 ankle stable motor exam normal  Cardiovascular exam is intact and normal sensation no lymphadenopathy normal reflexes normal balance  X-rays show hydration of the interfragmentary K wire ankle otherwise fixation looks normal mortise is intact  Impression painful hardware after surgery  Recommend removing the protruding K wire as well as the medial K wires  Patient out of work for 2 weeks  Plan for hardware removal left ankle

## 2013-08-24 ENCOUNTER — Telehealth: Payer: Self-pay | Admitting: Orthopedic Surgery

## 2013-08-24 ENCOUNTER — Other Ambulatory Visit: Payer: Self-pay | Admitting: *Deleted

## 2013-08-24 DIAGNOSIS — M25572 Pain in left ankle and joints of left foot: Secondary | ICD-10-CM

## 2013-08-24 MED ORDER — HYDROCODONE-ACETAMINOPHEN 5-325 MG PO TABS: 1.0000 | ORAL_TABLET | ORAL | Status: DC | PRN

## 2013-08-24 NOTE — Telephone Encounter (Signed)
Dr. Romeo Apple refilled prescription. Patient picked up.

## 2013-08-24 NOTE — Telephone Encounter (Signed)
Routing to Dr Harrison 

## 2013-08-24 NOTE — Telephone Encounter (Signed)
Patient called to ask for prescription, pain medication-patient's notes indicate she received, per Emergency Room visit on 08/17/13, Hydrocodone-Norco/Vicodin 5-325mg . Per patient's office visit here, 08/22/13, no medication prescribed? Her surgery is scheduled 09/01/13.  States goes to [employer] in [place]. Also states she is in pain, as she is trying to work up through her surgery.  Her cell ph# is [phone]

## 2013-08-28 ENCOUNTER — Encounter (HOSPITAL_COMMUNITY): Payer: Self-pay

## 2013-08-28 ENCOUNTER — Telehealth: Payer: Self-pay | Admitting: Orthopedic Surgery

## 2013-08-28 ENCOUNTER — Encounter (HOSPITAL_COMMUNITY)
Admission: RE | Admit: 2013-08-28 | Discharge: 2013-08-28 | Disposition: A | Discharge status: Home / Self Care | Payer: Medicaid Other | Source: Ambulatory Visit | Attending: Orthopedic Surgery | Admitting: Orthopedic Surgery

## 2013-08-28 DIAGNOSIS — Z01812 Encounter for preprocedural laboratory examination: Secondary | ICD-10-CM | POA: Insufficient documentation

## 2013-08-28 LAB — BASIC METABOLIC PANEL
BUN: 10 mg/dL (ref 6–23)
CO2: 26 mEq/L (ref 19–32)
Calcium: 9.9 mg/dL (ref 8.4–10.5)
Chloride: 100 mEq/L (ref 96–112)
Creatinine, Ser: 1 mg/dL (ref 0.50–1.10)
GFR calc Af Amer: 85 mL/min — ABNORMAL LOW (ref >90)
GFR calc non Af Amer: 74 mL/min — ABNORMAL LOW (ref >90)
Glucose, Bld: 89 mg/dL (ref 70–99)
Potassium: 4.4 mEq/L (ref 3.5–5.1)
Sodium: 140 mEq/L (ref 135–145)

## 2013-08-28 MED ORDER — CHLORHEXIDINE GLUCONATE 4 % EX LIQD: 60.0000 mL | Freq: Once | CUTANEOUS | Status: DC

## 2013-08-28 NOTE — Telephone Encounter (Addendum)
Regarding out-patient surgery, CPT 20680, ICD9 codes 719.47, V54.19, scheduled for 09/01/13; per insurer Medicaid, [employer] code verification indicates no pre-authorization required.  Called to insurer to verify - ph# [phone].

## 2013-08-28 NOTE — Patient Instructions (Addendum)
Mallory Mendoza  08/28/2013   Your procedure is scheduled on:  09/01/13  Report to [facility] at 10:00 AM.  Call this number if you have problems the morning of surgery: [phone]   Remember:   Do not eat food or drink liquids after midnight.   Take these medicines the morning of surgery with A SIP OF WATER: Fluoxetine. You make take your Hydrocodone if needed.   Do not wear jewelry, make-up or nail polish.  Do not wear lotions, powders, or perfumes.   Do not shave 48 hours prior to surgery. Men may shave face and neck.  Do not bring valuables to the hospital.  [facility] is not responsible for any belongings or valuables.               Contacts, dentures or bridgework may not be worn into surgery.  Leave suitcase in the car. After surgery it may be brought to your room.  For patients admitted to the hospital, discharge time is determined by your treatment team.               Patients discharged the day of surgery will not be allowed to drive home.   Special Instructions: Shower using CHG 2 nights before surgery and the night before surgery.  If you shower the day of surgery use CHG.  Use special wash - you have one bottle of CHG for all showers.  You should use approximately 1/3 of the bottle for each shower.   Please read over the following fact sheets that you were given: Pain Booklet, Surgical Site Infection Prevention, Anesthesia Post-op Instructions and Care and Recovery After Surgery    Hardware Removal Hardware removal is a procedure that removes medical devices used to repair broken bones. Hardware may be pins, screws, rods, wires, plates, or other implants. Some types of hardware are meant to stay in place permanently. Other types of hardware may be removed after the broken bone has healed. Sometimes, hardware is removed because it causes problems. These problems include infection, ongoing pain, or failure of the device. In certain cases, older types of hardware may be  replaced with newer, better materials. Young children often need to have hardware removed in order to allow for proper bone growth. LET YOUR CAREGIVER KNOW ABOUT:   Allergies to food or medicine.  Medicines taken, including vitamins, herbs, eyedrops, over-the-counter medicines, and creams.  Use of steroids (by mouth or creams).  Previous problems with anesthetics or numbing medicines.  History of bleeding problems or blood clots.  Previous surgery.  Other health problems, including diabetes and kidney problems.  Possibility of pregnancy, if this applies. RISKS AND COMPLICATIONS   Infection.  Bleeding.  Pain.  Bone breaking again (refracture).  Failure to completely remove all implants. BEFORE THE PROCEDURE   You may be asked to stop taking blood thinners, aspirin, and/or nonsteroidal anti-inflammatory drugs (like ibuprofen) before the procedure.  You will usually be asked to stop eating and drinking at least 6 hours before the procedure.  If your procedure is done so that you go home the same day (outpatient), you will need someone to drive you home. PROCEDURE   You will be asked to change into a hospital gown.  You will lie on an exam table. A variety of monitors will be connected to you in order to track your heart rate, blood pressure, and breathing throughout the procedure.  You will have an intravenous (IV) access placed in your vein.  You  may be given general anesthesia to help you sleep through the procedure or a sedative to relax you. You will also be given a local or regional anesthetic to numb the area.  X-rays may be taken to accurately find the hardware.  The surgeon will make a cut (incision) over the area where the hardware is located.  The hardware will be carefully removed.  The incision will be closed with stitches (sutures), staples, or special glue. A bandage will be placed over the area to keep it clean and dry.  Often splints, casts, or  removable walking boots are used to protect your limb while your wound heals. AFTER THE PROCEDURE   You may be sleepy.  You may have some pain or feel sick to your stomach (nauseous). This can usually be controlled with medicines.  You will stay in the recovery room until you are awake and able to drink fluids.  Check with your caregiver about when you can return to your usual level of activity or whether you will need physical therapy or rehabilitation. HOME CARE INSTRUCTIONS   Take all medicines exactly as directed.  Follow any prescribed diet.  Follow instructions regarding both rest and physical activity. Be sure you understand when it is okay to bear weight. SEEK IMMEDIATE MEDICAL CARE IF:   You have severe or lasting pain.  You or your child has an oral temperature above 102 F (38.9 C), not controlled by medicine.  Chills develop.  The incision area appears red, hot, puffy (swollen), or covered with pus.  You have difficulty breathing.  You cannot pass gas.  You are unable to have a bowel movement within 2 days.  You have persistent numbness in the limb beyond 24 hours. MAKE SURE YOU:   Understand these instructions.  Will watch your condition.  Will get help right away if you are not doing well or get worse. Document Released: 08/30/2009 Document Revised: 01/25/2012 Document Reviewed: 08/30/2009 [facility] Patient Information 2014 [place], [place].    PATIENT INSTRUCTIONS POST-ANESTHESIA  IMMEDIATELY FOLLOWING SURGERY:  Do not drive or operate machinery for the first twenty four hours after surgery.  Do not make any important decisions for twenty four hours after surgery or while taking narcotic pain medications or sedatives.  If you develop intractable nausea and vomiting or a severe headache please notify your doctor immediately.  FOLLOW-UP:  Please make an appointment with your surgeon as instructed. You do not need to follow up with anesthesia unless  specifically instructed to do so.  WOUND CARE INSTRUCTIONS (if applicable):  Keep a dry clean dressing on the anesthesia/puncture wound site if there is drainage.  Once the wound has quit draining you may leave it open to air.  Generally you should leave the bandage intact for twenty four hours unless there is drainage.  If the epidural site drains for more than 36-48 hours please call the anesthesia department.  QUESTIONS?:  Please feel free to call your physician or the hospital operator if you have any questions, and they will be happy to assist you.

## 2013-08-31 NOTE — H&P (Addendum)
  BP Ht Wt BMI LMP    144/104 5\' 4"  (1.626 m) 179 lb (81.194 kg) 30.71 kg/m2 08/17/2013 Chief Complaint   Patient presents with   .  Ankle Pain       Left ankle pain, thinks pin has come loose    HISTORY: 32 year old female had fracture of the left ankle with open treatment internal fixation bimalleolar fracture lateral plate and screw construct and medial K wires January 2014. The patient did well initially with her surgical treatment except for the fact we had to place her in a cast to get adequate ankle dorsiflexion. She went back to work has had persistent swelling and occasional pain which increased back on October 1. Her pain is over the medial aspect of the ankle and didn't just proximal to that posteriorly one of the K wires has migrated into the soft tissue. Pain levels are 7/10 described as sharp. The swelling occurs when she's standing and walking. She denies any numbness or tingling  Review of systems is negative except for the swelling and stiffness in the ankle.  She has a history of depression  She's had 3 cesarean sections tubal ligation she had an abscess drained  Current medication Prozac Xanax hydrocodone and ibuprofen family history of arthritis social history she is single she works as a Conservation officer, nature she smokes a pack cigarettes a day she drinks minimal alcohol she does use caffeinated beverages and high school education was completed through the 11th grade  Blood pressure 144/104, height 5\' 4"  (1.626 m), weight 179 lb (81.194 kg), last menstrual period 08/17/2013. General appearance is normal she is alert and oriented x3 her mood and affect are pleasant she walks with no significant limp but on careful examination decreased dorsiflexion at the ankle joint  Upper extremity exam  The right and left upper extremity:     Inspection and palpation revealed no abnormalities in the upper extremities.    Range of motion is full without contracture.  Motor exam is normal with  grade 5 strength.  The joints are fully reduced without subluxation.  There is no atrophy or tremor and muscle tone is normal.  All joints are stable.   Right lower extremity     Inspection and palpation revealed no abnormalities in the upper extremities.    Range of motion is full without contracture.  Motor exam is normal with grade 5 strength.  The joints are fully reduced without subluxation.  There is no atrophy or tremor and muscle tone is normal.  All joints are stable.  Left lower extremity medial lateral incisions noted tenderness over the medial incision and swelling inferior to the medial malleolus 4 cm proximal to this the K wire is noted tenting the skin. No signs of infection dorsiflexion limited to 5 ankle stable motor exam normal  Cardiovascular exam is intact and normal sensation no lymphadenopathy normal reflexes normal balance  X-rays show hydration of the interfragmentary K wire ankle otherwise fixation looks normal mortise is intact  Impression painful hardware after surgery  Recommend removing the protruding K wire as well as the medial K wires from the left ankle  Patient out of work for 2 weeks  Plan for hardware removal left ankle

## 2013-09-01 ENCOUNTER — Encounter (HOSPITAL_COMMUNITY): Payer: Medicaid Other | Admitting: Anesthesiology

## 2013-09-01 ENCOUNTER — Encounter (HOSPITAL_COMMUNITY): Payer: Self-pay | Admitting: *Deleted

## 2013-09-01 ENCOUNTER — Ambulatory Visit (HOSPITAL_COMMUNITY): Payer: Medicaid Other | Admitting: Anesthesiology

## 2013-09-01 ENCOUNTER — Ambulatory Visit (HOSPITAL_COMMUNITY)
Admission: RE | Admit: 2013-09-01 | Discharge: 2013-09-01 | Disposition: A | Discharge status: Home / Self Care | Payer: Medicaid Other | Source: Ambulatory Visit | Attending: Orthopedic Surgery | Admitting: Orthopedic Surgery

## 2013-09-01 ENCOUNTER — Encounter (HOSPITAL_COMMUNITY)
Admission: RE | Disposition: A | Discharge status: Home / Self Care | Payer: Self-pay | Source: Ambulatory Visit | Attending: Orthopedic Surgery

## 2013-09-01 DIAGNOSIS — S82842S Displaced bimalleolar fracture of left lower leg, sequela: Secondary | ICD-10-CM

## 2013-09-01 DIAGNOSIS — M25579 Pain in unspecified ankle and joints of unspecified foot: Secondary | ICD-10-CM | POA: Insufficient documentation

## 2013-09-01 DIAGNOSIS — S8290XS Unspecified fracture of unspecified lower leg, sequela: Secondary | ICD-10-CM

## 2013-09-01 DIAGNOSIS — M25572 Pain in left ankle and joints of left foot: Secondary | ICD-10-CM

## 2013-09-01 DIAGNOSIS — T8489XA Other specified complication of internal orthopedic prosthetic devices, implants and grafts, initial encounter: Secondary | ICD-10-CM | POA: Insufficient documentation

## 2013-09-01 DIAGNOSIS — Y849 Medical procedure, unspecified as the cause of abnormal reaction of the patient, or of later complication, without mention of misadventure at the time of the procedure: Secondary | ICD-10-CM | POA: Insufficient documentation

## 2013-09-01 HISTORY — PX: HARDWARE REMOVAL: SHX979

## 2013-09-01 SURGERY — REMOVAL, HARDWARE
Anesthesia: General | Site: Ankle | Laterality: Left | Wound class: Clean

## 2013-09-01 MED ORDER — BUPIVACAINE-EPINEPHRINE PF 0.5-1:200000 % IJ SOLN
INTRAMUSCULAR | Status: AC
  Filled 2013-09-01: qty 10

## 2013-09-01 MED ORDER — PROPOFOL 10 MG/ML IV BOLUS
INTRAVENOUS | Status: DC | PRN
  Administered 2013-09-01: 160 mg via INTRAVENOUS

## 2013-09-01 MED ORDER — MIDAZOLAM HCL 2 MG/2ML IJ SOLN
INTRAMUSCULAR | Status: AC
  Filled 2013-09-01: qty 2

## 2013-09-01 MED ORDER — FENTANYL CITRATE 0.05 MG/ML IJ SOLN
INTRAMUSCULAR | Status: AC
  Filled 2013-09-01: qty 2

## 2013-09-01 MED ORDER — LIDOCAINE HCL (PF) 1 % IJ SOLN
INTRAMUSCULAR | Status: AC
  Filled 2013-09-01: qty 5

## 2013-09-01 MED ORDER — BUPIVACAINE-EPINEPHRINE 0.5% -1:200000 IJ SOLN
INTRAMUSCULAR | Status: DC | PRN
  Administered 2013-09-01: 30 mL

## 2013-09-01 MED ORDER — FENTANYL CITRATE 0.05 MG/ML IJ SOLN
25.0000 ug | INTRAMUSCULAR | Status: AC
  Administered 2013-09-01 (×2): 25 ug via INTRAVENOUS

## 2013-09-01 MED ORDER — CEFAZOLIN SODIUM-DEXTROSE 2-3 GM-% IV SOLR
2.0000 g | INTRAVENOUS | Status: AC
  Administered 2013-09-01: 2 g via INTRAVENOUS

## 2013-09-01 MED ORDER — HYDROCODONE-ACETAMINOPHEN 10-325 MG PO TABS: 1.0000 | ORAL_TABLET | ORAL | Status: DC | PRN

## 2013-09-01 MED ORDER — FENTANYL CITRATE 0.05 MG/ML IJ SOLN
25.0000 ug | INTRAMUSCULAR | Status: DC | PRN
  Administered 2013-09-01 (×2): 50 ug via INTRAVENOUS

## 2013-09-01 MED ORDER — MIDAZOLAM HCL 2 MG/2ML IJ SOLN
1.0000 mg | INTRAMUSCULAR | Status: DC | PRN
  Administered 2013-09-01 (×2): 2 mg via INTRAVENOUS

## 2013-09-01 MED ORDER — CEFAZOLIN SODIUM-DEXTROSE 2-3 GM-% IV SOLR
INTRAVENOUS | Status: AC
  Filled 2013-09-01: qty 50

## 2013-09-01 MED ORDER — MIDAZOLAM HCL 5 MG/5ML IJ SOLN
INTRAMUSCULAR | Status: DC | PRN
  Administered 2013-09-01: 2 mg via INTRAVENOUS

## 2013-09-01 MED ORDER — PROPOFOL 10 MG/ML IV BOLUS
INTRAVENOUS | Status: AC
  Filled 2013-09-01: qty 20

## 2013-09-01 MED ORDER — LACTATED RINGERS IV SOLN
INTRAVENOUS | Status: DC
  Administered 2013-09-01: 10:00:00 via INTRAVENOUS

## 2013-09-01 MED ORDER — PROMETHAZINE HCL 12.5 MG PO TABS: 12.5000 mg | ORAL_TABLET | Freq: Four times a day (QID) | ORAL | Status: DC | PRN

## 2013-09-01 MED ORDER — FENTANYL CITRATE 0.05 MG/ML IJ SOLN
INTRAMUSCULAR | Status: AC
  Filled 2013-09-01: qty 5

## 2013-09-01 MED ORDER — ONDANSETRON HCL 4 MG/2ML IJ SOLN: 4.0000 mg | Freq: Once | INTRAMUSCULAR | Status: DC | PRN

## 2013-09-01 MED ORDER — FENTANYL CITRATE 0.05 MG/ML IJ SOLN
INTRAMUSCULAR | Status: DC | PRN
  Administered 2013-09-01 (×2): 50 ug via INTRAVENOUS

## 2013-09-01 MED ORDER — SODIUM CHLORIDE 0.9 % IR SOLN
Status: DC | PRN
  Administered 2013-09-01: 1000 mL

## 2013-09-01 SURGICAL SUPPLY — 51 items
BAG HAMPER (MISCELLANEOUS) ×2 IMPLANT
BANDAGE ELASTIC 4 VELCRO NS (GAUZE/BANDAGES/DRESSINGS) ×2 IMPLANT
BANDAGE ELASTIC 6 VELCRO NS (GAUZE/BANDAGES/DRESSINGS) IMPLANT
BANDAGE ESMARK 4X12 BL STRL LF (DISPOSABLE) ×1 IMPLANT
BLADE SURG SZ10 CARB STEEL (BLADE) ×2 IMPLANT
BNDG COHESIVE 4X5 TAN STRL (GAUZE/BANDAGES/DRESSINGS) ×2 IMPLANT
BNDG ESMARK 4X12 BLUE STRL LF (DISPOSABLE) ×2
CHLORAPREP W/TINT 26ML (MISCELLANEOUS) ×2 IMPLANT
CLOTH BEACON ORANGE TIMEOUT ST (SAFETY) ×2 IMPLANT
COVER LIGHT HANDLE STERIS (MISCELLANEOUS) ×4 IMPLANT
CUFF TOURNIQUET SINGLE 24IN (TOURNIQUET CUFF) IMPLANT
CUFF TOURNIQUET SINGLE 34IN LL (TOURNIQUET CUFF) ×2 IMPLANT
CUFF TOURNIQUET SINGLE 44IN (TOURNIQUET CUFF) IMPLANT
DECANTER SPIKE VIAL GLASS SM (MISCELLANEOUS) ×2 IMPLANT
DRAPE C-ARM FOLDED MOBILE STRL (DRAPES) ×2 IMPLANT
DRAPE OEC MINIVIEW 54X84 (DRAPES) IMPLANT
ELECT REM PT RETURN 9FT ADLT (ELECTROSURGICAL) ×2
ELECTRODE REM PT RTRN 9FT ADLT (ELECTROSURGICAL) ×1 IMPLANT
GAUZE SPONGE 4X4 12PLY STRL LF (GAUZE/BANDAGES/DRESSINGS) ×2 IMPLANT
GAUZE XEROFORM 5X9 LF (GAUZE/BANDAGES/DRESSINGS) ×2 IMPLANT
GLOVE BIOGEL PI IND STRL 7.0 (GLOVE) ×3 IMPLANT
GLOVE BIOGEL PI INDICATOR 7.0 (GLOVE) ×3
GLOVE EXAM NITRILE MD LF STRL (GLOVE) ×2 IMPLANT
GLOVE SKINSENSE NS SZ8.0 LF (GLOVE) ×1
GLOVE SKINSENSE STRL SZ8.0 LF (GLOVE) ×1 IMPLANT
GLOVE SS BIOGEL STRL SZ 6.5 (GLOVE) ×1 IMPLANT
GLOVE SS N UNI LF 8.5 STRL (GLOVE) ×2 IMPLANT
GLOVE SUPERSENSE BIOGEL SZ 6.5 (GLOVE) ×1
GOWN STRL REIN XL XLG (GOWN DISPOSABLE) ×6 IMPLANT
INST SET MINOR BONE (KITS) ×2 IMPLANT
KIT ROOM TURNOVER APOR (KITS) ×2 IMPLANT
MANIFOLD NEPTUNE II (INSTRUMENTS) ×2 IMPLANT
NEEDLE HYPO 21X1 ECLIPSE (NEEDLE) IMPLANT
NEEDLE HYPO 21X1.5 SAFETY (NEEDLE) ×2 IMPLANT
NS IRRIG 1000ML POUR BTL (IV SOLUTION) ×2 IMPLANT
PACK BASIC LIMB (CUSTOM PROCEDURE TRAY) ×2 IMPLANT
PAD ABD 5X9 TENDERSORB (GAUZE/BANDAGES/DRESSINGS) IMPLANT
PAD ARMBOARD 7.5X6 YLW CONV (MISCELLANEOUS) ×2 IMPLANT
PAD CAST 4YDX4 CTTN HI CHSV (CAST SUPPLIES) ×1 IMPLANT
PADDING CAST COTTON 4X4 STRL (CAST SUPPLIES) ×1
PADDING WEBRIL 4 STERILE (GAUZE/BANDAGES/DRESSINGS) ×2 IMPLANT
SET BASIN LINEN APH (SET/KITS/TRAYS/PACK) ×2 IMPLANT
SPONGE LAP 18X18 X RAY DECT (DISPOSABLE) ×2 IMPLANT
SPONGE LAP 4X18 X RAY DECT (DISPOSABLE) IMPLANT
STAPLER VISISTAT 35W (STAPLE) ×2 IMPLANT
SUT ETHILON 3 0 FSL (SUTURE) ×2 IMPLANT
SUT MON AB 0 CT1 (SUTURE) ×2 IMPLANT
SUT MON AB 2-0 CT1 36 (SUTURE) ×2 IMPLANT
SUT PROLENE 4 0 PS 2 18 (SUTURE) IMPLANT
SYR 3ML LL SCALE MARK (SYRINGE) ×2 IMPLANT
SYR BULB IRRIGATION 50ML (SYRINGE) ×4 IMPLANT

## 2013-09-01 NOTE — Anesthesia Postprocedure Evaluation (Signed)
  Anesthesia Post-op Note  Patient: Mallory Mendoza  Procedure(s) Performed: Procedure(s): HARDWARE REMOVAL (Left)  Patient Location: PACU  Anesthesia Type:General  Level of Consciousness: awake, alert , oriented and patient cooperative  Airway and Oxygen Therapy: Patient Spontanous Breathing  Post-op Pain: 3 /10, mild  Post-op Assessment: Post-op Vital signs reviewed, Patient's Cardiovascular Status Stable, Respiratory Function Stable, Patent Airway and Pain level controlled  Post-op Vital Signs: Reviewed and stable  Complications: No apparent anesthesia complications

## 2013-09-01 NOTE — Op Note (Signed)
09/01/2013  12:28 PM  PATIENT:  Mallory Mendoza  32 y.o. female  PRE-OPERATIVE DIAGNOSIS:  left ankle pain  POST-OPERATIVE DIAGNOSIS:  left ankle pain  PROCEDURE:  Procedure(s): HARDWARE REMOVAL (Left) ankle 3 k wires   SURGEON:  Surgeon(s) and Role:    * Jazlynne Milliner E Irish Breisch, MD - Primary  PHYSICIAN ASSISTANT:   ASSISTANTS: none   ANESTHESIA:   general  EBL:  Total I/O In: 400 [I.V.:400] Out: -   BLOOD ADMINISTERED:none  DRAINS: none   LOCAL MEDICATIONS USED:  MARCAINE    and Amount: 30 with epi  ml  SPECIMEN:  No Specimen  DISPOSITION OF SPECIMEN:  N/A  COUNTS:  YES  TOURNIQUET:   Total Tourniquet Time Documented: Thigh (Left) - 16 minutes Total: Thigh (Left) - 16 minutes   DICTATION: .Dragon Dictation  PLAN OF CARE: Discharge to home after PACU  PATIENT DISPOSITION:  PACU - hemodynamically stable.   Delay start of Pharmacological VTE agent (>24hrs) due to surgical blood loss or risk of bleeding: not applicable  Details of procedure Proper patient identification methods were used to identify the patient, the consent and chart was reviewed and the left ankle was marked as a surgical site on the medial side. The patient was taken to the operating room given appropriate antibiotics based on her weight of 80 kg with no known allergies. She was given general anesthesia. Her left leg was prepped and draped sterilely. The tourniquet was elevated to 3 mm of mercury. Timeout was completed.  The proximal K wire was tenting the skin a small stab wound was made and the K wire was removed intact.  We then went through the previous medial incision distal half only and found the K wires easily. They were removed intact. The wound was irrigated and closed with 2-0 Monocryl and staples distally and simple staples proximally  We injected 30 cc of Marcaine with epinephrine divided between each moved appropriately.  Sterile dressing was applied tourniquet was released patient  was extubated taken recovery room in stable condition 

## 2013-09-01 NOTE — Brief Op Note (Signed)
09/01/2013  12:28 PM  PATIENT:  Mallory Mendoza  32 y.o. female  PRE-OPERATIVE DIAGNOSIS:  left ankle pain  POST-OPERATIVE DIAGNOSIS:  left ankle pain  PROCEDURE:  Procedure(s): HARDWARE REMOVAL (Left) ankle 3 k wires   SURGEON:  Surgeon(s) and Role:    * Vickki Hearing, MD - Primary  PHYSICIAN ASSISTANT:   ASSISTANTS: none   ANESTHESIA:   general  EBL:  Total I/O In: 400 [I.V.:400] Out: -   BLOOD ADMINISTERED:none  DRAINS: none   LOCAL MEDICATIONS USED:  MARCAINE    and Amount: 30 with epi  ml  SPECIMEN:  No Specimen  DISPOSITION OF SPECIMEN:  N/A  COUNTS:  YES  TOURNIQUET:   Total Tourniquet Time Documented: Thigh (Left) - 16 minutes Total: Thigh (Left) - 16 minutes   DICTATION: .Reubin Milan Dictation  PLAN OF CARE: Discharge to home after PACU  PATIENT DISPOSITION:  PACU - hemodynamically stable.   Delay start of Pharmacological VTE agent (>24hrs) due to surgical blood loss or risk of bleeding: not applicable  Details of procedure Proper patient identification methods were used to identify the patient, the consent and chart was reviewed and the left ankle was marked as a surgical site on the medial side. The patient was taken to the operating room given appropriate antibiotics based on her weight of 80 kg with no known allergies. She was given general anesthesia. Her left leg was prepped and draped sterilely. The tourniquet was elevated to 3 mm of mercury. Timeout was completed.  The proximal K wire was tenting the skin a small stab wound was made and the K wire was removed intact.  We then went through the previous medial incision distal half only and found the K wires easily. They were removed intact. The wound was irrigated and closed with 2-0 Monocryl and staples distally and simple staples proximally  We injected 30 cc of Marcaine with epinephrine divided between each moved appropriately.  Sterile dressing was applied tourniquet was released patient  was extubated taken recovery room in stable condition

## 2013-09-01 NOTE — Progress Notes (Signed)
From OR. Awake. Talking. Asking for something to drink. Wants to go home. Denies pain. Ginger-ale given to drink. Tolerated well.

## 2013-09-01 NOTE — Interval H&P Note (Signed)
History and Physical Interval Note:  09/01/2013 10:05 AM  Mallory Mendoza  has presented today for surgery, with the diagnosis of left ankle pain  The various methods of treatment have been discussed with the patient and family. After consideration of risks, benefits and other options for treatment, the patient has consented to  Procedure(s): HARDWARE REMOVAL (Left) ANKLE as a surgical intervention .  The patient's history has been reviewed, patient examined, no change in status, stable for surgery.  I have reviewed the patient's chart and labs.  Questions were answered to the patient's satisfaction.     [name]

## 2013-09-01 NOTE — Transfer of Care (Signed)
Immediate Anesthesia Transfer of Care Note  Patient: Mallory Mendoza  Procedure(s) Performed: Procedure(s): HARDWARE REMOVAL (Left)  Patient Location: PACU  Anesthesia Type:General  Level of Consciousness: awake, alert , oriented and patient cooperative  Airway & Oxygen Therapy: Patient Spontanous Breathing and Patient connected to face mask oxygen  Post-op Assessment: Report given to PACU RN, Post -op Vital signs reviewed and stable and Patient moving all extremities  Post vital signs: Reviewed and stable  Complications: No apparent anesthesia complications

## 2013-09-01 NOTE — Anesthesia Procedure Notes (Signed)
Procedure Name: LMA Insertion Date/Time: 09/01/2013 11:53 AM Performed by: Despina Hidden Pre-anesthesia Checklist: Patient identified, Emergency Drugs available, Suction available and Patient being monitored Oxygen Delivery Method: Circle system utilized Preoxygenation: Pre-oxygenation with 100% oxygen Intubation Type: IV induction Ventilation: Mask ventilation without difficulty LMA: LMA inserted LMA Size: 3.0 Grade View: Grade I Tube type: Oral Number of attempts: 2 Placement Confirmation: positive ETCO2 and breath sounds checked- equal and bilateral Tube secured with: Tape Dental Injury: Teeth and Oropharynx as per pre-operative assessment

## 2013-09-01 NOTE — Interval H&P Note (Signed)
History and Physical Interval Note:  09/01/2013 11:18 AM  Mallory Mendoza  has presented today for surgery, with the diagnosis of left ankle pain  The various methods of treatment have been discussed with the patient and family. After consideration of risks, benefits and other options for treatment, the patient has consented to  Procedure(s): HARDWARE REMOVAL (Left) ankle as a surgical intervention .  The patient's history has been reviewed, patient examined, no change in status, stable for surgery.  I have reviewed the patient's chart and labs.  Questions were answered to the patient's satisfaction.     [name]

## 2013-09-01 NOTE — Anesthesia Preprocedure Evaluation (Signed)
Anesthesia Evaluation  Patient identified by MRN, date of birth, ID band Patient awake    Reviewed: Allergy & Precautions, H&P , NPO status , Patient's Chart, lab work & pertinent test results  History of Anesthesia Complications (+) MALIGNANT HYPERTHERMIANegative for: history of anesthetic complications  Airway Mallampati: II TM Distance: >3 FB     Dental  (+) Teeth Intact, Poor Dentition and Chipped,    Pulmonary Current Smoker, PE: am cough. breath sounds clear to auscultation        Cardiovascular negative cardio ROS  Rhythm:Regular Rate:Normal     Neuro/Psych PSYCHIATRIC DISORDERS Anxiety Depression    GI/Hepatic negative GI ROS,   Endo/Other    Renal/GU      Musculoskeletal   Abdominal   Peds  Hematology   Anesthesia Other Findings   Reproductive/Obstetrics                           Anesthesia Physical Anesthesia Plan  ASA: II  Anesthesia Plan: General   Post-op Pain Management:    Induction: Intravenous  Airway Management Planned: LMA  Additional Equipment:   Intra-op Plan:   Post-operative Plan: Extubation in OR  Informed Consent: I have reviewed the patients History and Physical, chart, labs and discussed the procedure including the risks, benefits and alternatives for the proposed anesthesia with the patient or authorized representative who has indicated his/her understanding and acceptance.     Plan Discussed with:   Anesthesia Plan Comments:         Anesthesia Quick Evaluation

## 2013-09-04 ENCOUNTER — Ambulatory Visit (INDEPENDENT_AMBULATORY_CARE_PROVIDER_SITE_OTHER): Payer: Medicaid Other | Admitting: Orthopedic Surgery

## 2013-09-04 ENCOUNTER — Encounter: Payer: Self-pay | Admitting: Orthopedic Surgery

## 2013-09-04 VITALS — BP 106/82 | Ht 64.0 in | Wt 177.0 lb

## 2013-09-04 DIAGNOSIS — S82842D Displaced bimalleolar fracture of left lower leg, subsequent encounter for closed fracture with routine healing: Secondary | ICD-10-CM

## 2013-09-04 DIAGNOSIS — IMO0001 Reserved for inherently not codable concepts without codable children: Secondary | ICD-10-CM

## 2013-09-04 DIAGNOSIS — M25579 Pain in unspecified ankle and joints of unspecified foot: Secondary | ICD-10-CM

## 2013-09-04 DIAGNOSIS — M25572 Pain in left ankle and joints of left foot: Secondary | ICD-10-CM

## 2013-09-04 MED ORDER — HYDROCODONE-ACETAMINOPHEN 10-325 MG PO TABS: 1.0000 | ORAL_TABLET | ORAL | Status: DC | PRN

## 2013-09-04 NOTE — Progress Notes (Signed)
Patient ID: Mallory Mendoza, female   DOB: Mar 09, 1981, 32 y.o.   MRN: [id number]  Chief Complaint  Patient presents with  . Follow-up    Post op #1, left ankle hardware removal. DOS 09-01-13.    Blood pressure 106/82, height 5\' 4"  (1.626 m), weight 177 lb (80.287 kg), last menstrual period 08/16/2013. Post op HWR right ankle   Wound is clean   Return for staples out 10-30

## 2013-09-04 NOTE — Patient Instructions (Addendum)
On Wednesday start weight bearing as tolerated   OOW note , return to work Nov 3rd

## 2013-09-14 ENCOUNTER — Ambulatory Visit (INDEPENDENT_AMBULATORY_CARE_PROVIDER_SITE_OTHER): Payer: Medicaid Other | Admitting: Orthopedic Surgery

## 2013-09-14 ENCOUNTER — Encounter: Payer: Self-pay | Admitting: Orthopedic Surgery

## 2013-09-14 VITALS — BP 125/85 | Ht 64.0 in | Wt 177.0 lb

## 2013-09-14 DIAGNOSIS — M25579 Pain in unspecified ankle and joints of unspecified foot: Secondary | ICD-10-CM

## 2013-09-14 DIAGNOSIS — M25572 Pain in left ankle and joints of left foot: Secondary | ICD-10-CM

## 2013-09-14 MED ORDER — HYDROCODONE-ACETAMINOPHEN 10-325 MG PO TABS: 1.0000 | ORAL_TABLET | ORAL | Status: DC | PRN

## 2013-09-14 NOTE — Progress Notes (Signed)
Patient ID: Mallory Mendoza, female   DOB: 24-Jan-1981, 31 y.o.   MRN: [id number]   Chief Complaint  Patient presents with  . Follow-up    10 day follow up right ankle hardware removal DOS 09/01/13    This is postop visit #2 staples were removed. The wound looks clean.  Returned to work on Monday follow up as needed  Meds ordered this encounter  Medications  . HYDROcodone-acetaminophen (NORCO) 10-325 MG per tablet    Sig: Take 1 tablet by mouth every 4 (four) hours as needed for pain.    Dispense:  120 tablet    Refill:  0

## 2013-09-14 NOTE — Patient Instructions (Signed)
RTW ON Monday NOV 3RD CHANGE BAND-AIDS DAILY

## 2013-09-21 ENCOUNTER — Telehealth: Payer: Self-pay | Admitting: Orthopedic Surgery

## 2013-09-21 NOTE — Telephone Encounter (Signed)
Patient called to relay that since she's been back to work this week, she feels that she may need "1 more refillL" on the pain medication, Hydrocodone 10-325.  She is not scheduled for follow up appointment as per visit 09/14/13, unless needs to schedule.  Please advise.  Patient's ph# is [phone].

## 2013-09-22 NOTE — Telephone Encounter (Signed)
Patient informed that she wasn't due for refill, and she could call back when it is time, and we would ask Dr. Romeo Apple at that time.

## 2013-10-02 ENCOUNTER — Other Ambulatory Visit: Payer: Self-pay | Admitting: Orthopedic Surgery

## 2013-10-02 MED ORDER — HYDROCODONE-ACETAMINOPHEN 10-325 MG PO TABS: 1.0000 | ORAL_TABLET | Freq: Four times a day (QID) | ORAL | Status: DC | PRN

## 2013-10-02 NOTE — Telephone Encounter (Signed)
Routing to Dr Harrison 

## 2013-10-02 NOTE — Telephone Encounter (Signed)
Patient called and informed prescription was ready to be picked up.

## 2013-10-02 NOTE — Telephone Encounter (Signed)
10/02/13 - Patient called back for nurse, requesting the refill for Norco 10-325, as per nurse note 09/22/13.  Her phone # is [phone].

## 2013-12-04 ENCOUNTER — Ambulatory Visit (INDEPENDENT_AMBULATORY_CARE_PROVIDER_SITE_OTHER): Payer: Medicaid Other | Admitting: Orthopedic Surgery

## 2013-12-04 ENCOUNTER — Encounter: Payer: Self-pay | Admitting: Orthopedic Surgery

## 2013-12-04 VITALS — BP 105/73 | Ht 64.0 in | Wt 177.0 lb

## 2013-12-04 DIAGNOSIS — L0291 Cutaneous abscess, unspecified: Secondary | ICD-10-CM

## 2013-12-04 DIAGNOSIS — R21 Rash and other nonspecific skin eruption: Secondary | ICD-10-CM

## 2013-12-04 DIAGNOSIS — L0390 Cellulitis, unspecified: Secondary | ICD-10-CM

## 2013-12-04 DIAGNOSIS — S82843A Displaced bimalleolar fracture of unspecified lower leg, initial encounter for closed fracture: Secondary | ICD-10-CM

## 2013-12-04 MED ORDER — CEPHALEXIN 500 MG PO CAPS: ORAL_CAPSULE | ORAL | Status: DC

## 2013-12-04 MED ORDER — HYDROCODONE-ACETAMINOPHEN 10-325 MG PO TABS: 1.0000 | ORAL_TABLET | Freq: Four times a day (QID) | ORAL | Status: DC | PRN

## 2013-12-04 MED ORDER — HYDROCORTISONE 1 % EX LOTN
1.0000 | TOPICAL_LOTION | Freq: Two times a day (BID) | CUTANEOUS | Status: DC
Start: 2013-12-04 — End: 2013-12-21

## 2013-12-04 NOTE — Patient Instructions (Signed)
Pick up antibiotic and creme at pharmacy

## 2013-12-04 NOTE — Progress Notes (Signed)
Patient ID: Mallory Mendoza, female   DOB: May 17, 1981, 33 y.o.   MRN: [id number]  Chief Complaint  Patient presents with  . Wound Check    Incision check left ankle    Status post open treatment internal fixation bimalleolar fracture left ankle January of 2014 followed by hardware removal medial side of the ankle October 2014. The patient went back to work was doing well until about a week ago she started having itching around the lateral incision approximately mid pole. And started having some bleeding. And now she's developed a rash there. The medial side of the ankle is not bothering her. The rest of the incision is normal. Incision is not open but is noted to have what appears to be a 3 cm x 2 and half centimeter rash.  She denies fever chills she does complain of pain along the lateral portion of the ankle and she did note some swelling which improved with the ibuprofen  Past Medical History  Diagnosis Date  . Depression   . Anxiety   . Depression      BP 105/73  Ht 5\' 4"  (1.626 m)  Wt 177 lb (80.287 kg)  BMI 30.37 kg/m2 General appearance is normal, the patient is alert and oriented x3 with normal mood and affect. And does not appear to be affecting her ambulation  The ankle joint itself has 30 dorsiflexion plantar flexion ARC. Anterior posterior stability normal. Motor exam normal. She has no swelling today. The skin shows a rash slight superficial epidermal areas show some mild bleeding there raised areas in the middle of the lesion pulses good sensation is normal  Impression rash  Recommend antibiotic to prophylactically treat infection and 1% hydrocortisone cream daily dressing changes follow up in a week recheck

## 2013-12-12 ENCOUNTER — Encounter: Payer: Self-pay | Admitting: Orthopedic Surgery

## 2013-12-12 ENCOUNTER — Ambulatory Visit (INDEPENDENT_AMBULATORY_CARE_PROVIDER_SITE_OTHER): Payer: Medicaid Other | Admitting: Orthopedic Surgery

## 2013-12-12 VITALS — BP 145/96 | Ht 64.0 in | Wt 177.0 lb

## 2013-12-12 DIAGNOSIS — R21 Rash and other nonspecific skin eruption: Secondary | ICD-10-CM

## 2013-12-12 DIAGNOSIS — S82843A Displaced bimalleolar fracture of unspecified lower leg, initial encounter for closed fracture: Secondary | ICD-10-CM

## 2013-12-12 NOTE — Patient Instructions (Signed)
Continue current meds 

## 2013-12-12 NOTE — Progress Notes (Signed)
Patient ID: Mallory Mendoza, female   DOB: 02-03-1981, 34 y.o.   MRN: [id number] Chief Complaint  Patient presents with  . Follow-up    one week recheck left ankle     Possible ringworm or viral infection of the skin improving with hydrocortisone cream  Continue antibiotics and hydrocortisone cream followup in a week

## 2013-12-19 ENCOUNTER — Ambulatory Visit: Payer: Medicaid Other | Admitting: Orthopedic Surgery

## 2013-12-21 ENCOUNTER — Ambulatory Visit (INDEPENDENT_AMBULATORY_CARE_PROVIDER_SITE_OTHER): Payer: Medicaid Other | Admitting: Orthopedic Surgery

## 2013-12-21 ENCOUNTER — Encounter: Payer: Self-pay | Admitting: Orthopedic Surgery

## 2013-12-21 VITALS — BP 153/103 | Ht 64.0 in | Wt 177.0 lb

## 2013-12-21 DIAGNOSIS — L0291 Cutaneous abscess, unspecified: Secondary | ICD-10-CM

## 2013-12-21 DIAGNOSIS — L0390 Cellulitis, unspecified: Secondary | ICD-10-CM

## 2013-12-21 DIAGNOSIS — R21 Rash and other nonspecific skin eruption: Secondary | ICD-10-CM

## 2013-12-21 MED ORDER — HYDROCORTISONE 1 % EX LOTN: 1.0000 "application " | TOPICAL_LOTION | Freq: Two times a day (BID) | CUTANEOUS | Status: DC

## 2013-12-21 MED ORDER — HYDROCODONE-ACETAMINOPHEN 10-325 MG PO TABS: 1.0000 | ORAL_TABLET | Freq: Four times a day (QID) | ORAL | Status: DC | PRN

## 2013-12-21 MED ORDER — IBUPROFEN 800 MG PO TABS: 800.0000 mg | ORAL_TABLET | Freq: Three times a day (TID) | ORAL | Status: DC | PRN

## 2013-12-21 NOTE — Progress Notes (Signed)
Patient ID: Mallory Mendoza, female   DOB: [birth year], 33 y.o.   MRN: [id number] Chief Complaint  Patient presents with  . Follow-up    1 week recheck skin check    Status post open treatment internal fixation left ankle did well. Postoperatively approximately a year or so later developed a rash been treating it with hydrocortisone cream. We did give HER-2 weeks of antibiotics for prophylactic treatment for possible flexion. Do not believe she has infection but she does have a skin rash which is bothering her it's burning and itching which is partially controlled with hydrocortisone cream   Review of systems no fever BP 153/103  Ht 5' 4"  (1.626 m)  Wt 177 lb (80.287 kg)  BMI 30.37 kg/m2 The rash seems a little better  The ankle motion is normal there is no swelling  Recommend dermatology consult  Continue ibuprofen and hydrocortisone cream along with hydrocodone  Followup 2 weeks

## 2013-12-21 NOTE — Patient Instructions (Signed)
Take hydrocodone and ibuprofen   Apply hydrocortisone   Dermatology consult / rash

## 2013-12-22 ENCOUNTER — Telehealth: Payer: Self-pay | Admitting: *Deleted

## 2013-12-22 NOTE — Telephone Encounter (Signed)
Spoke with Lavella Lemons at [facility] and she advised they would make referral, but she would need office notes faxed from our office. I faxed the notes 12/22/13.

## 2014-01-04 ENCOUNTER — Ambulatory Visit (INDEPENDENT_AMBULATORY_CARE_PROVIDER_SITE_OTHER): Payer: Medicaid Other | Admitting: Orthopedic Surgery

## 2014-01-04 ENCOUNTER — Encounter: Payer: Self-pay | Admitting: Orthopedic Surgery

## 2014-01-04 VITALS — BP 112/68 | Ht 64.0 in | Wt 177.0 lb

## 2014-01-04 DIAGNOSIS — L0390 Cellulitis, unspecified: Secondary | ICD-10-CM

## 2014-01-04 DIAGNOSIS — R21 Rash and other nonspecific skin eruption: Secondary | ICD-10-CM

## 2014-01-04 DIAGNOSIS — L0291 Cutaneous abscess, unspecified: Secondary | ICD-10-CM

## 2014-01-04 MED ORDER — HYDROCODONE-ACETAMINOPHEN 10-325 MG PO TABS: 1.0000 | ORAL_TABLET | Freq: Four times a day (QID) | ORAL | Status: DC | PRN

## 2014-01-04 NOTE — Progress Notes (Signed)
Patient ID: Mallory Mendoza, female   DOB: 1981/03/27, 33 y.o.   MRN: [id number]  Encounter Diagnoses  Name Primary?  . Cellulitis Yes  . Rash and nonspecific skin eruption    BP 112/68  Ht 5\' 4"  (1.626 m)  Wt 177 lb (80.287 kg)  BMI 30.37 kg/m2  Chief Complaint  Patient presents with  . Follow-up    2 week recheck left ankle s/p medication     Followup the cellulitis never materialized were basically treating her for her skin rash and awaiting a dermatology consult. She gets good relief from the hydrocortisone cream and pain relief from the [facility]. Continue both follow up in 2 weeks continue pursuit of dermatology consult

## 2014-01-04 NOTE — Patient Instructions (Signed)
Continue cream

## 2014-01-16 ENCOUNTER — Encounter: Payer: Self-pay | Admitting: Orthopedic Surgery

## 2014-01-18 ENCOUNTER — Ambulatory Visit (INDEPENDENT_AMBULATORY_CARE_PROVIDER_SITE_OTHER): Payer: Medicaid Other | Admitting: Orthopedic Surgery

## 2014-01-18 VITALS — BP 122/59 | Ht 64.0 in | Wt 177.0 lb

## 2014-01-18 DIAGNOSIS — L0291 Cutaneous abscess, unspecified: Secondary | ICD-10-CM

## 2014-01-18 DIAGNOSIS — L0390 Cellulitis, unspecified: Secondary | ICD-10-CM

## 2014-01-18 MED ORDER — HYDROCODONE-ACETAMINOPHEN 10-325 MG PO TABS: 1.0000 | ORAL_TABLET | Freq: Four times a day (QID) | ORAL | Status: DC | PRN

## 2014-01-18 MED ORDER — HYDROCORTISONE 1 % EX LOTN: 1.0000 "application " | TOPICAL_LOTION | Freq: Two times a day (BID) | CUTANEOUS | Status: DC

## 2014-01-18 NOTE — Patient Instructions (Signed)
Try DR. Beavers in [place]

## 2014-01-18 NOTE — Progress Notes (Signed)
Patient ID: Mallory Mendoza, female   DOB: January 19, 1981, 33 y.o.   MRN: [id number] Followup rash swelling left ankle swelling has gone down still has a rash, Medicaid patient no one will take her insurance for dermatology so are still sort of in limbo she's using the cream seems to keep things under control the pain medicine is managing the pain  Recommend continue current medications followup in 2 weeks until we get a dermatologist to see her

## 2014-02-01 ENCOUNTER — Encounter: Payer: Self-pay | Admitting: Orthopedic Surgery

## 2014-02-01 ENCOUNTER — Ambulatory Visit (INDEPENDENT_AMBULATORY_CARE_PROVIDER_SITE_OTHER): Payer: Medicaid Other | Admitting: Orthopedic Surgery

## 2014-02-01 VITALS — BP 122/84 | Ht 64.0 in | Wt 177.0 lb

## 2014-02-01 DIAGNOSIS — R21 Rash and other nonspecific skin eruption: Secondary | ICD-10-CM

## 2014-02-01 NOTE — Progress Notes (Signed)
Patient ID: Mallory Mendoza, female   DOB: 01-06-1981, 33 y.o.   MRN: [id number]  Chief Complaint  Patient presents with  . Follow-up    2 week follow up left ankle (ORIF 12/16/12 and Hardware removal 09/01/13)    Encounter Diagnosis  Name Primary?  . Rash and nonspecific skin eruption Yes    BP 122/84  Ht 5\' 4"  (1.626 m)  Wt 177 lb (80.287 kg)  BMI 30.37 kg/m2  Improved ito itching, pain when standing   Exam skin decreased swelling   Continue cream and hydrocodone   return 2 weeks

## 2014-02-15 ENCOUNTER — Ambulatory Visit (INDEPENDENT_AMBULATORY_CARE_PROVIDER_SITE_OTHER): Payer: Medicaid Other | Admitting: Orthopedic Surgery

## 2014-02-15 VITALS — BP 126/83 | Ht 64.0 in | Wt 177.0 lb

## 2014-02-15 DIAGNOSIS — R21 Rash and other nonspecific skin eruption: Secondary | ICD-10-CM

## 2014-02-15 DIAGNOSIS — L0291 Cutaneous abscess, unspecified: Secondary | ICD-10-CM

## 2014-02-15 DIAGNOSIS — L0390 Cellulitis, unspecified: Secondary | ICD-10-CM

## 2014-02-15 MED ORDER — HYDROCODONE-ACETAMINOPHEN 10-325 MG PO TABS: 1.0000 | ORAL_TABLET | Freq: Four times a day (QID) | ORAL | Status: DC | PRN

## 2014-02-15 NOTE — Progress Notes (Signed)
Patient ID: Mallory Mendoza, female   DOB: 1981-08-02, 33 y.o.   MRN: [id number] Chief Complaint  Patient presents with  . Follow-up    2 week recheck left ankle. DOS 09-01-13.    The patient has not been able to see a dermatologist due to her insurance or Medicaid. However the cream is getting better she didn't have to work last night or today and her swelling went down. She's maintained on Norco 10 mg on a steroid cream and doing well all see her in 4 weeks

## 2014-02-15 NOTE — Patient Instructions (Signed)
Continued amen Norco for pain followup in 4 weeks

## 2014-03-15 ENCOUNTER — Encounter: Payer: Self-pay | Admitting: Orthopedic Surgery

## 2014-03-15 ENCOUNTER — Ambulatory Visit (INDEPENDENT_AMBULATORY_CARE_PROVIDER_SITE_OTHER): Payer: Medicaid Other | Admitting: Orthopedic Surgery

## 2014-03-15 VITALS — BP 124/88 | Ht 64.0 in | Wt 177.0 lb

## 2014-03-15 DIAGNOSIS — S82843A Displaced bimalleolar fracture of unspecified lower leg, initial encounter for closed fracture: Secondary | ICD-10-CM

## 2014-03-15 DIAGNOSIS — M25579 Pain in unspecified ankle and joints of unspecified foot: Secondary | ICD-10-CM

## 2014-03-15 DIAGNOSIS — R21 Rash and other nonspecific skin eruption: Secondary | ICD-10-CM

## 2014-03-15 DIAGNOSIS — M25572 Pain in left ankle and joints of left foot: Secondary | ICD-10-CM

## 2014-03-15 MED ORDER — HYDROCODONE-ACETAMINOPHEN 10-325 MG PO TABS: 1.0000 | ORAL_TABLET | Freq: Four times a day (QID) | ORAL | Status: DC | PRN

## 2014-03-15 NOTE — Progress Notes (Signed)
Patient ID: Mallory Mendoza, female   DOB: 30-Aug-1981, 33 y.o.   MRN: [id number]  Chief Complaint  Patient presents with  . Follow-up    4 week recheck left ankle DOS 09/01/13    Swelling and pain at work when standing. Status post ankle fracture fixation. It's been over a year he still has some discomfort developed around one to get dermatology to know what take Medicaid so we treated with a topical cream which seems to be working here to take [place] for pain  Vital signs are stable  Rash appears to be clear she has some tenderness over the incision no swelling at this point ankle motion is returned to near normal ankle is stable pulses are good sensation is intact General appearance is normal, the patient is alert and oriented x3 with normal mood and affect.   Meds ordered this encounter  Medications  . HYDROcodone-acetaminophen (NORCO) 10-325 MG per tablet    Sig: Take 1 tablet by mouth every 6 (six) hours as needed.    Dispense:  120 tablet    Refill:  0    Encounter Diagnosis  Name Primary?  . Cellulitis Yes   ankle fracture Ankle pain  Return 4 weeks

## 2014-03-15 NOTE — Patient Instructions (Signed)
Wear compression stockings for swelling

## 2014-04-01 ENCOUNTER — Encounter (HOSPITAL_COMMUNITY): Payer: Self-pay | Admitting: Emergency Medicine

## 2014-04-01 ENCOUNTER — Emergency Department (HOSPITAL_COMMUNITY): Payer: Medicaid Other

## 2014-04-01 ENCOUNTER — Emergency Department (HOSPITAL_COMMUNITY)
Admission: EM | Admit: 2014-04-01 | Discharge: 2014-04-01 | Disposition: A | Discharge status: Home / Self Care | Payer: Medicaid Other | Attending: Emergency Medicine | Admitting: Emergency Medicine

## 2014-04-01 DIAGNOSIS — Z791 Long term (current) use of non-steroidal anti-inflammatories (NSAID): Secondary | ICD-10-CM | POA: Insufficient documentation

## 2014-04-01 DIAGNOSIS — X500XXA Overexertion from strenuous movement or load, initial encounter: Secondary | ICD-10-CM | POA: Insufficient documentation

## 2014-04-01 DIAGNOSIS — Y92009 Unspecified place in unspecified non-institutional (private) residence as the place of occurrence of the external cause: Secondary | ICD-10-CM | POA: Insufficient documentation

## 2014-04-01 DIAGNOSIS — S93409A Sprain of unspecified ligament of unspecified ankle, initial encounter: Secondary | ICD-10-CM | POA: Insufficient documentation

## 2014-04-01 DIAGNOSIS — F329 Major depressive disorder, single episode, unspecified: Secondary | ICD-10-CM | POA: Insufficient documentation

## 2014-04-01 DIAGNOSIS — Z8781 Personal history of (healed) traumatic fracture: Secondary | ICD-10-CM | POA: Insufficient documentation

## 2014-04-01 DIAGNOSIS — Z79899 Other long term (current) drug therapy: Secondary | ICD-10-CM | POA: Insufficient documentation

## 2014-04-01 DIAGNOSIS — F3289 Other specified depressive episodes: Secondary | ICD-10-CM | POA: Insufficient documentation

## 2014-04-01 DIAGNOSIS — F17200 Nicotine dependence, unspecified, uncomplicated: Secondary | ICD-10-CM | POA: Insufficient documentation

## 2014-04-01 DIAGNOSIS — F411 Generalized anxiety disorder: Secondary | ICD-10-CM | POA: Insufficient documentation

## 2014-04-01 DIAGNOSIS — IMO0002 Reserved for concepts with insufficient information to code with codable children: Secondary | ICD-10-CM | POA: Insufficient documentation

## 2014-04-01 DIAGNOSIS — S93401A Sprain of unspecified ligament of right ankle, initial encounter: Secondary | ICD-10-CM

## 2014-04-01 DIAGNOSIS — Y9301 Activity, walking, marching and hiking: Secondary | ICD-10-CM | POA: Insufficient documentation

## 2014-04-01 MED ORDER — MELOXICAM 7.5 MG PO TABS: 7.5000 mg | ORAL_TABLET | Freq: Every day | ORAL | Status: DC

## 2014-04-01 MED ORDER — HYDROCODONE-ACETAMINOPHEN 5-325 MG PO TABS: 1.0000 | ORAL_TABLET | ORAL | Status: DC | PRN

## 2014-04-01 NOTE — ED Provider Notes (Signed)
  Medical screening examination/treatment/procedure(s) were performed by non-physician practitioner and as supervising physician I was immediately available for consultation/collaboration.   EKG Interpretation None         Carmin Muskrat, MD 04/01/14 1743

## 2014-04-01 NOTE — ED Provider Notes (Signed)
CSN: [id number]     Arrival date & time 04/01/14  1518 History   First MD Initiated Contact with Patient 04/01/14 1601     Chief Complaint  Patient presents with  . Ankle Pain     (Consider location/radiation/quality/duration/timing/severity/associated sxs/prior Treatment) Patient is a 33 y.o. female presenting with ankle pain. The history is provided by the patient.  Ankle Pain Location:  Ankle Time since incident:  1 day Injury: yes   Mechanism of injury: fall   Fall:    Fall occurred:  Walking   Impact surface:  [employer] of impact:  Hands   Entrapped after fall: no   Ankle location:  R ankle Pain details:    Quality:  Shooting, throbbing and aching   Radiates to:  R leg   Onset quality:  Sudden   Timing:  Constant   Progression:  Worsening Chronicity:  New Dislocation: no   Foreign body present:  No foreign bodies Tetanus status:  Up to date Prior injury to area:  No Relieved by:  Nothing Worsened by:  Activity and bearing weight Ineffective treatments:  Ice, NSAIDs and elevation Associated symptoms: decreased ROM and swelling    Mallory Mendoza is a 33 y.o. female who presents to the ED with ankle pain. She was walking in her yard yesterday and stepped in a hole and twisted her right ankle. She has swelling and pain. wrapped in ace wrap but continues to hurt.   Past Medical History  Diagnosis Date  . Depression   . Anxiety   . Depression    Past Surgical History  Procedure Laterality Date  . Cesarean section  1998, 2000, 2002    x3  . Tubal ligation    . Orif ankle fracture  12/16/2012    Procedure: OPEN REDUCTION INTERNAL FIXATION (ORIF) ANKLE FRACTURE;  Surgeon: Carole Civil, MD;  Location: [facility];  Service: Orthopedics;  Laterality: Left;  . Cast application Left 0/73/7106    Procedure: CAST APPLICATION;  Surgeon: Carole Civil, MD;  Location: [facility];  Service: Orthopedics;  Laterality: Left;  . Hardware removal Left 09/01/2013   Procedure: HARDWARE REMOVAL;  Surgeon: Carole Civil, MD;  Location: [facility];  Service: Orthopedics;  Laterality: Left;   No family history on file. History  Substance Use Topics  . Smoking status: Current Every Day Smoker -- 1.00 packs/day for 12 years    Types: Cigarettes  . Smokeless tobacco: Not on file  . Alcohol Use: Yes     Comment: occasional drink on the holidays   OB History   Grav Para Term Preterm Abortions TAB SAB Ect Mult Living                 Review of Systems Negative except as stated in HPI   Allergies  Review of patient's allergies indicates no known allergies.  Home Medications   Prior to Admission medications   Medication Sig Start Date End Date Taking? Authorizing Provider  alprazolam Duanne Moron) 2 MG tablet Take 1 tablet (2 mg total) by mouth at bedtime as needed for sleep. 08/16/13   Doran Heater, MD  FLUoxetine (PROZAC) 20 MG capsule Take 1 capsule (20 mg total) by mouth daily. 07/12/13   Doran Heater, MD  HYDROcodone-acetaminophen (NORCO) 10-325 MG per tablet Take 1 tablet by mouth every 6 (six) hours as needed. 03/15/14   Carole Civil, MD  hydrocortisone 1 % lotion Apply 1 application topically 2 (two) times  daily. 01/18/14   Carole Civil, MD  ibuprofen (ADVIL,MOTRIN) 800 MG tablet Take 1 tablet (800 mg total) by mouth every 8 (eight) hours as needed. 12/21/13   Carole Civil, MD  promethazine (PHENERGAN) 12.5 MG tablet Take 1 tablet (12.5 mg total) by mouth every 6 (six) hours as needed for nausea. 09/01/13   Carole Civil, MD   BP 112/93  Pulse 98  Temp(Src) 98.6 F (37 C) (Oral)  Resp 20  SpO2 99%  LMP 03/18/2014 Physical Exam  Nursing note and vitals reviewed. Constitutional: She is oriented to person, place, and time. She appears well-developed and well-nourished.  HENT:  Head: Normocephalic and atraumatic.  Eyes: Conjunctivae and EOM are normal.  Neck: Neck supple.  Cardiovascular: Normal rate.   Pulmonary/Chest:  Effort normal.  Musculoskeletal: Normal range of motion.       Right ankle: She exhibits swelling and ecchymosis. She exhibits normal range of motion, no deformity, no laceration and normal pulse. Tenderness. Lateral malleolus tenderness found. Achilles tendon normal.  Pedal pulses strong, adequate circulation. There is moderate swelling of the lateral aspect of the ankle with ecchymosis. Pain with any range of motion.   Neurological: She is alert and oriented to person, place, and time. She has normal strength. No cranial nerve deficit or sensory deficit. Gait (due to pain) abnormal.  Skin: Skin is warm and dry.  Psychiatric: She has a normal mood and affect. Her behavior is normal.    ED Course  Procedures Dg Ankle Complete Right  04/01/2014   CLINICAL DATA:  Right ankle pain.  Status post fall.  EXAM: RIGHT ANKLE - COMPLETE 3+ VIEW  COMPARISON:  None.  FINDINGS: Normal anatomic alignment. No evidence for acute fracture or dislocation. Soft tissue swelling about the lateral malleolus. The talar dome is intact. Posterior calcaneal spurring.  IMPRESSION: Soft tissue swelling about the lateral aspect of the ankle joint.  No evidence for acute fracture.   Electronically Signed   By: Lovey Newcomer M.D.   On: 04/01/2014 16:09    MDM  33 y.o. female with swelling, ecchymosis and tenderness to the lateral aspect of the right ankle s/p injury yesterday. No improve with home tx of elevation, ice, ace wrap and ibuprofen. Patient placed in ASO, crutches and pain management. She will follow up with Dr. Aline Brochure who did her surgery on her other ankle last year. Discussed with the patient and all questioned fully answered.    Medication List    ASK your doctor about these medications       alprazolam 2 MG tablet  Commonly known as:  XANAX  Take 1 tablet (2 mg total) by mouth at bedtime as needed for sleep.     FLUoxetine 20 MG capsule  Commonly known as:  PROZAC  Take 1 capsule (20 mg total) by mouth  daily.     HYDROcodone-acetaminophen 10-325 MG per tablet  Commonly known as:  NORCO  Take 1 tablet by mouth every 6 (six) hours as needed.     hydrocortisone 1 % lotion  Apply 1 application topically 2 (two) times daily.     ibuprofen 800 MG tablet  Commonly known as:  ADVIL,MOTRIN  Take 1 tablet (800 mg total) by mouth every 8 (eight) hours as needed.     promethazine 12.5 MG tablet  Commonly known as:  PHENERGAN  Take 1 tablet (12.5 mg total) by mouth every 6 (six) hours as needed for nausea.  Ashley Murrain, [place] 04/01/14 [phone]

## 2014-04-01 NOTE — ED Notes (Signed)
Scott from [facility] called to verify prescription, pt was given Norco 5-325 mg #20 tablets by [facility] NP today, pt was given prescription of Norco 10-325 mg #120 tablets filled on 03/18/2014, spoke with [facility] NP who advised to cancel her prescription, Scott with [facility] was notified,

## 2014-04-01 NOTE — Discharge Instructions (Signed)
Call Dr. Aline Brochure for a follow up appointment. Return here as needed.

## 2014-04-01 NOTE — ED Notes (Signed)
Pt was in yard yesterday and stepped in hole twisting her right ankle, pt has swelling and pain to right ankle area, has ace bandage in place, cms intact distal,

## 2014-04-12 ENCOUNTER — Ambulatory Visit (INDEPENDENT_AMBULATORY_CARE_PROVIDER_SITE_OTHER): Payer: Medicaid Other | Admitting: Orthopedic Surgery

## 2014-04-12 VITALS — BP 124/98 | Ht 64.0 in | Wt 177.0 lb

## 2014-04-12 DIAGNOSIS — L0291 Cutaneous abscess, unspecified: Secondary | ICD-10-CM

## 2014-04-12 DIAGNOSIS — R21 Rash and other nonspecific skin eruption: Secondary | ICD-10-CM

## 2014-04-12 DIAGNOSIS — L0390 Cellulitis, unspecified: Secondary | ICD-10-CM

## 2014-04-12 MED ORDER — HYDROCODONE-ACETAMINOPHEN 10-325 MG PO TABS: 1.0000 | ORAL_TABLET | Freq: Four times a day (QID) | ORAL | Status: DC | PRN

## 2014-04-12 MED ORDER — HYDROCORTISONE 1 % EX LOTN: 1.0000 "application " | TOPICAL_LOTION | Freq: Two times a day (BID) | CUTANEOUS | Status: DC

## 2014-04-12 NOTE — Patient Instructions (Signed)
Ice baths 5 min in cool water then 5 min in warm water for total 30 minutes   Wear brace x 4 weeks

## 2014-04-12 NOTE — Progress Notes (Signed)
Patient ID: Mallory Mendoza, female   DOB: [birth year], 33 y.o.   MRN: [id number] Chief Complaint  Patient presents with  . Follow-up    4 week recheck left ankle    New problem right ankle pain   33 year old female came in for her routine left ankle skin check doing well but recently 2 weeks ago sprained her right ankle. Stepped in a hole twisted complains of pain swelling loss of motion. Went to ER x-rays reviewed x-rays negative she's an ASO brace  Review of systems negative  Past Medical History  Diagnosis Date  . Depression   . Anxiety   . Depression    Vital signs: BP 124/98  Ht 5\' 4"  (1.626 m)  Wt 177 lb (80.287 kg)  BMI 30.37 kg/m2  LMP 03/18/2014   General the patient is well-developed and well-nourished grooming and hygiene are normal Oriented x3 Mood and affect normal Ambulation ambulating with a slight right lower extremity limp Inspection of the right ankle pain swelling loss of motion but anterior drawer stable decreased eversion Skin clean dry and intact  Cardiovascular exam is normal Sensory exam normal   New problem Ankle sprain  Contrast baths ASO for 4 weeks recheck 4 weeks

## 2014-05-17 ENCOUNTER — Ambulatory Visit (INDEPENDENT_AMBULATORY_CARE_PROVIDER_SITE_OTHER): Payer: Medicaid Other | Admitting: Orthopedic Surgery

## 2014-05-17 ENCOUNTER — Encounter: Payer: Self-pay | Admitting: Orthopedic Surgery

## 2014-05-17 DIAGNOSIS — L02419 Cutaneous abscess of limb, unspecified: Secondary | ICD-10-CM

## 2014-05-17 DIAGNOSIS — R21 Rash and other nonspecific skin eruption: Secondary | ICD-10-CM

## 2014-05-17 DIAGNOSIS — L03119 Cellulitis of unspecified part of limb: Secondary | ICD-10-CM

## 2014-05-17 DIAGNOSIS — L03116 Cellulitis of left lower limb: Secondary | ICD-10-CM

## 2014-05-17 MED ORDER — HYDROCODONE-ACETAMINOPHEN 10-325 MG PO TABS: 1.0000 | ORAL_TABLET | Freq: Four times a day (QID) | ORAL | Status: DC | PRN

## 2014-05-17 MED ORDER — HYDROCORTISONE 1 % EX LOTN: 1.0000 "application " | TOPICAL_LOTION | Freq: Two times a day (BID) | CUTANEOUS | Status: DC

## 2014-05-17 NOTE — Patient Instructions (Signed)
Continue wearing brace

## 2014-05-17 NOTE — Progress Notes (Signed)
Subjective:     Patient ID: Mallory Mendoza, female   DOB: 30-Sep-1981, 33 y.o.   MRN: [id number] Chief Complaint  Patient presents with  . Ankle Problem    ALT ankle sprain right ankle    HPI 33 year old female injured her right ankle treated with an ASO brace which she still wearing still complains of lateral ankle pain    Review of Systems Skin rash on the left ankle controlled well with hydrocortisone cream    Objective:   Physical Exam Appearance normal orientation x3 mood normal gait normal. Right ankle tenderness lateral gutter. Painful plantar flexion which is normal normal dorsiflexion. No instability on drawer test or inversion test. Motor exam normal skin on the right ankle normal left ankle rash seems macular and controlled    Assessment:     Right ankle sprain Left leg rash    Plan:     Meds ordered this encounter  Medications  . HYDROcodone-acetaminophen (NORCO) 10-325 MG per tablet    Sig: Take 1 tablet by mouth every 6 (six) hours as needed.    Dispense:  120 tablet    Refill:  0  . hydrocortisone 1 % lotion    Sig: Apply 1 application topically 2 (two) times daily.    Dispense:  118 mL    Refill:  0    4 week recheck

## 2014-06-06 ENCOUNTER — Emergency Department (HOSPITAL_COMMUNITY): Payer: Medicaid Other

## 2014-06-06 ENCOUNTER — Encounter (HOSPITAL_COMMUNITY): Payer: Self-pay | Admitting: Emergency Medicine

## 2014-06-06 ENCOUNTER — Emergency Department (HOSPITAL_COMMUNITY)
Admission: EM | Admit: 2014-06-06 | Discharge: 2014-06-06 | Disposition: A | Discharge status: Home / Self Care | Payer: Medicaid Other | Attending: Emergency Medicine | Admitting: Emergency Medicine

## 2014-06-06 DIAGNOSIS — R079 Chest pain, unspecified: Secondary | ICD-10-CM | POA: Insufficient documentation

## 2014-06-06 DIAGNOSIS — Z9851 Tubal ligation status: Secondary | ICD-10-CM | POA: Diagnosis not present

## 2014-06-06 DIAGNOSIS — M25473 Effusion, unspecified ankle: Secondary | ICD-10-CM | POA: Diagnosis not present

## 2014-06-06 DIAGNOSIS — F411 Generalized anxiety disorder: Secondary | ICD-10-CM | POA: Diagnosis not present

## 2014-06-06 DIAGNOSIS — N83209 Unspecified ovarian cyst, unspecified side: Secondary | ICD-10-CM | POA: Diagnosis not present

## 2014-06-06 DIAGNOSIS — Z3202 Encounter for pregnancy test, result negative: Secondary | ICD-10-CM | POA: Insufficient documentation

## 2014-06-06 DIAGNOSIS — Z9889 Other specified postprocedural states: Secondary | ICD-10-CM | POA: Diagnosis not present

## 2014-06-06 DIAGNOSIS — R109 Unspecified abdominal pain: Secondary | ICD-10-CM | POA: Diagnosis present

## 2014-06-06 DIAGNOSIS — F3289 Other specified depressive episodes: Secondary | ICD-10-CM | POA: Diagnosis not present

## 2014-06-06 DIAGNOSIS — M25476 Effusion, unspecified foot: Secondary | ICD-10-CM | POA: Insufficient documentation

## 2014-06-06 DIAGNOSIS — F329 Major depressive disorder, single episode, unspecified: Secondary | ICD-10-CM | POA: Diagnosis not present

## 2014-06-06 DIAGNOSIS — F17200 Nicotine dependence, unspecified, uncomplicated: Secondary | ICD-10-CM | POA: Insufficient documentation

## 2014-06-06 DIAGNOSIS — D270 Benign neoplasm of right ovary: Secondary | ICD-10-CM

## 2014-06-06 DIAGNOSIS — IMO0002 Reserved for concepts with insufficient information to code with codable children: Secondary | ICD-10-CM | POA: Insufficient documentation

## 2014-06-06 DIAGNOSIS — R1030 Lower abdominal pain, unspecified: Secondary | ICD-10-CM

## 2014-06-06 DIAGNOSIS — Z79899 Other long term (current) drug therapy: Secondary | ICD-10-CM | POA: Diagnosis not present

## 2014-06-06 LAB — URINALYSIS, ROUTINE W REFLEX MICROSCOPIC
Bilirubin Urine: NEGATIVE
Glucose, UA: NEGATIVE mg/dL
Ketones, ur: NEGATIVE mg/dL
Nitrite: NEGATIVE
Protein, ur: NEGATIVE mg/dL
Specific Gravity, Urine: >1.03 — ABNORMAL HIGH (ref 1.005–1.030)
Urobilinogen, UA: 0.2 mg/dL (ref 0.0–1.0)
pH: 5.5 (ref 5.0–8.0)

## 2014-06-06 LAB — COMPREHENSIVE METABOLIC PANEL
ALT: 72 U/L — ABNORMAL HIGH (ref 0–35)
AST: 89 U/L — ABNORMAL HIGH (ref 0–37)
Albumin: 4.1 g/dL (ref 3.5–5.2)
Alkaline Phosphatase: 70 U/L (ref 39–117)
Anion gap: 14 (ref 5–15)
BUN: 8 mg/dL (ref 6–23)
CO2: 26 mEq/L (ref 19–32)
Calcium: 9.4 mg/dL (ref 8.4–10.5)
Chloride: 102 mEq/L (ref 96–112)
Creatinine, Ser: 0.84 mg/dL (ref 0.50–1.10)
GFR calc Af Amer: >90 mL/min (ref >90)
GFR calc non Af Amer: >90 mL/min (ref >90)
Glucose, Bld: 83 mg/dL (ref 70–99)
Potassium: 4.5 mEq/L (ref 3.7–5.3)
Sodium: 142 mEq/L (ref 137–147)
Total Bilirubin: 0.3 mg/dL (ref 0.3–1.2)
Total Protein: 7.9 g/dL (ref 6.0–8.3)

## 2014-06-06 LAB — CBC WITH DIFFERENTIAL/PLATELET
Basophils Absolute: 0 10*3/uL (ref 0.0–0.1)
Basophils Relative: 0 % (ref 0–1)
Eosinophils Absolute: 0.2 10*3/uL (ref 0.0–0.7)
Eosinophils Relative: 4 % (ref 0–5)
HCT: 48 % — ABNORMAL HIGH (ref 36.0–46.0)
Hemoglobin: 16.1 g/dL — ABNORMAL HIGH (ref 12.0–15.0)
Lymphocytes Relative: 45 % (ref 12–46)
Lymphs Abs: 2.4 10*3/uL (ref 0.7–4.0)
MCH: 34.1 pg — ABNORMAL HIGH (ref 26.0–34.0)
MCHC: 33.5 g/dL (ref 30.0–36.0)
MCV: 101.7 fL — ABNORMAL HIGH (ref 78.0–100.0)
Monocytes Absolute: 0.3 10*3/uL (ref 0.1–1.0)
Monocytes Relative: 6 % (ref 3–12)
Neutro Abs: 2.4 10*3/uL (ref 1.7–7.7)
Neutrophils Relative %: 45 % (ref 43–77)
Platelets: 289 10*3/uL (ref 150–400)
RBC: 4.72 MIL/uL (ref 3.87–5.11)
RDW: 12.8 % (ref 11.5–15.5)
WBC: 5.4 10*3/uL (ref 4.0–10.5)

## 2014-06-06 LAB — URINE MICROSCOPIC-ADD ON

## 2014-06-06 LAB — POC URINE PREG, ED: Preg Test, Ur: NEGATIVE

## 2014-06-06 LAB — LIPASE, BLOOD: Lipase: 22 U/L (ref 11–59)

## 2014-06-06 MED ORDER — TRAMADOL HCL 50 MG PO TABS: 50.0000 mg | ORAL_TABLET | Freq: Four times a day (QID) | ORAL | Status: DC | PRN

## 2014-06-06 MED ORDER — ONDANSETRON HCL 4 MG/2ML IJ SOLN
4.0000 mg | Freq: Once | INTRAMUSCULAR | Status: AC
  Administered 2014-06-06: 4 mg via INTRAVENOUS
  Filled 2014-06-06: qty 2

## 2014-06-06 MED ORDER — IOHEXOL 300 MG/ML  SOLN
100.0000 mL | Freq: Once | INTRAMUSCULAR | Status: AC | PRN
  Administered 2014-06-06: 100 mL via INTRAVENOUS

## 2014-06-06 MED ORDER — SODIUM CHLORIDE 0.9 % IV SOLN: INTRAVENOUS | Status: DC

## 2014-06-06 MED ORDER — SODIUM CHLORIDE 0.9 % IV SOLN
INTRAVENOUS | Status: DC
  Administered 2014-06-06: 12:00:00 via INTRAVENOUS

## 2014-06-06 MED ORDER — IOHEXOL 300 MG/ML  SOLN
50.0000 mL | Freq: Once | INTRAMUSCULAR | Status: AC | PRN
  Administered 2014-06-06: 50 mL via ORAL

## 2014-06-06 MED ORDER — SODIUM CHLORIDE 0.9 % IV BOLUS (SEPSIS)
250.0000 mL | Freq: Once | INTRAVENOUS | Status: AC
  Administered 2014-06-06: 250 mL via INTRAVENOUS

## 2014-06-06 NOTE — ED Notes (Signed)
Patient states rash to left upper arm. MD aware.

## 2014-06-06 NOTE — Discharge Instructions (Signed)
Abdominal Pain, Women °Abdominal (stomach, pelvic, or belly) pain can be caused by many things. It is important to tell your doctor: °· The location of the pain. °· Does it come and go or is it present all the time? °· Are there things that start the pain (eating certain foods, exercise)? °· Are there other symptoms associated with the pain (fever, nausea, vomiting, diarrhea)? °All of this is helpful to know when trying to find the cause of the pain. °CAUSES  °· Stomach: virus or bacteria infection, or ulcer. °· Intestine: appendicitis (inflamed appendix), regional ileitis (Crohn's disease), ulcerative colitis (inflamed colon), irritable bowel syndrome, diverticulitis (inflamed diverticulum of the colon), or cancer of the stomach or intestine. °· Gallbladder disease or stones in the gallbladder. °· Kidney disease, kidney stones, or infection. °· Pancreas infection or cancer. °· Fibromyalgia (pain disorder). °· Diseases of the female organs: °¨ Uterus: fibroid (non-cancerous) tumors or infection. °¨ Fallopian tubes: infection or tubal pregnancy. °¨ Ovary: cysts or tumors. °¨ Pelvic adhesions (scar tissue). °¨ Endometriosis (uterus lining tissue growing in the pelvis and on the pelvic organs). °¨ Pelvic congestion syndrome (female organs filling up with blood just before the menstrual period). °¨ Pain with the menstrual period. °¨ Pain with ovulation (producing an egg). °¨ Pain with an IUD (intrauterine device, birth control) in the uterus. °¨ Cancer of the female organs. °· Functional pain (pain not caused by a disease, may improve without treatment). °· Psychological pain. °· Depression. °DIAGNOSIS  °Your doctor will decide the seriousness of your pain by doing an examination. °· Blood tests. °· X-rays. °· Ultrasound. °· CT scan (computed tomography, special type of X-ray). °· MRI (magnetic resonance imaging). °· Cultures, for infection. °· Barium enema (dye inserted in the large intestine, to better view it with  X-rays). °· Colonoscopy (looking in intestine with a lighted tube). °· Laparoscopy (minor surgery, looking in abdomen with a lighted tube). °· Major abdominal exploratory surgery (looking in abdomen with a large incision). °TREATMENT  °The treatment will depend on the cause of the pain.  °· Many cases can be observed and treated at home. °· Over-the-counter medicines recommended by your caregiver. °· Prescription medicine. °· Antibiotics, for infection. °· Birth control pills, for painful periods or for ovulation pain. °· Hormone treatment, for endometriosis. °· Nerve blocking injections. °· Physical therapy. °· Antidepressants. °· Counseling with a psychologist or psychiatrist. °· Minor or major surgery. °HOME CARE INSTRUCTIONS  °· Do not take laxatives, unless directed by your caregiver. °· Take over-the-counter pain medicine only if ordered by your caregiver. Do not take aspirin because it can cause an upset stomach or bleeding. °· Try a clear liquid diet (broth or water) as ordered by your caregiver. Slowly move to a bland diet, as tolerated, if the pain is related to the stomach or intestine. °· Have a thermometer and take your temperature several times a day, and record it. °· Bed rest and sleep, if it helps the pain. °· Avoid sexual intercourse, if it causes pain. °· Avoid stressful situations. °· Keep your follow-up appointments and tests, as your caregiver orders. °· If the pain does not go away with medicine or surgery, you may try: °¨ Acupuncture. °¨ Relaxation exercises (yoga, meditation). °¨ Group therapy. °¨ Counseling. °SEEK MEDICAL CARE IF:  °· You notice certain foods cause stomach pain. °· Your home care treatment is not helping your pain. °· You need stronger pain medicine. °· You want your IUD removed. °· You feel faint or   lightheaded.  You develop nausea and vomiting.  You develop a rash.  You are having side effects or an allergy to your medicine. SEEK IMMEDIATE MEDICAL CARE IF:   Your  pain does not go away or gets worse.  You have a fever.  Your pain is felt only in portions of the abdomen. The right side could possibly be appendicitis. The left lower portion of the abdomen could be colitis or diverticulitis.  You are passing blood in your stools (bright red or black tarry stools, with or without vomiting).  You have blood in your urine.  You develop chills, with or without a fever.  You pass out. MAKE SURE YOU:   Understand these instructions.  Will watch your condition.  Will get help right away if you are not doing well or get worse. Document Released: 08/30/2007 Document Revised: 01/25/2012 Document Reviewed: 09/19/2009 [facility] Patient Information 2015 [place], [place]. This information is not intended to replace advice given to you by your health care provider. Make sure you discuss any questions you have with your health care provider. He is is a

## 2014-06-06 NOTE — ED Notes (Signed)
Pt c/o lower abd pain that started this AM upon waking. 8/10 pain. PMH of UTI

## 2014-06-06 NOTE — ED Provider Notes (Signed)
CSN: [id number]     Arrival date & time 06/06/14  0917 History  This chart was scribed for Mallory Sorrow, MD by Peyton Bottoms, ED Scribe. This patient was seen in room APA19/AP[facility]19 and the patient's care was started at 9:50 AM.   Chief Complaint  Patient presents with  . Abdominal Pain   Patient is a 33 y.o. female presenting with abdominal pain. The history is provided by the patient. No language interpreter was used.  Abdominal Pain Pain location:  Suprapubic Pain quality: sharp   Pain radiates to:  Does not radiate Pain severity:  Moderate Onset quality:  Sudden Duration:  4 hours Timing:  Intermittent Progression:  Unchanged Chronicity:  New Context: awakening from sleep   Context comment:  PMH of UTIs Relieved by:  Nothing Worsened by:  Nothing tried Ineffective treatments:  None tried Associated symptoms: chest pain (right-sided)   Associated symptoms: no chills, no cough, no diarrhea, no dysuria, no fever, no hematuria, no nausea, no shortness of breath, no sore throat, no vaginal bleeding, no vaginal discharge and no vomiting     HPI Comments: SKYRAH KRUPP is a 33 y.o. female who presents to the Emergency Department complaining of intermittent, non radiating, sharp bilateral suprapubic abdominal pain onset this morning at 6am. She states she had no pain last night. Patient currently rates pain as 8/10.  Pt states there are no modifying factors.Patient denies dysuria, nausea, vomitting. Her LNMP was at the beginning of July 2015. Patient is a current daily smoker.   Past Medical History  Diagnosis Date  . Depression   . Anxiety   . Depression    Past Surgical History  Procedure Laterality Date  . Cesarean section  1998, 2000, 2002    x3  . Tubal ligation    . Orif ankle fracture  12/16/2012    Procedure: OPEN REDUCTION INTERNAL FIXATION (ORIF) ANKLE FRACTURE;  Surgeon: Carole Civil, MD;  Location: [facility];  Service: Orthopedics;  Laterality: Left;  . Cast  application Left 6/81/1572    Procedure: CAST APPLICATION;  Surgeon: Carole Civil, MD;  Location: [facility];  Service: Orthopedics;  Laterality: Left;  . Hardware removal Left 09/01/2013    Procedure: HARDWARE REMOVAL;  Surgeon: Carole Civil, MD;  Location: [facility];  Service: Orthopedics;  Laterality: Left;   History reviewed. No pertinent family history. History  Substance Use Topics  . Smoking status: Current Every Day Smoker -- 1.00 packs/day for 12 years    Types: Cigarettes  . Smokeless tobacco: Not on file  . Alcohol Use: Yes     Comment: occasional drink on the holidays   OB History   Grav Para Term Preterm Abortions TAB SAB Ect Mult Living                 Review of Systems  Constitutional: Negative for fever and chills.  HENT: Negative for rhinorrhea and sore throat.   Eyes: Negative for visual disturbance.  Respiratory: Negative for cough and shortness of breath.   Cardiovascular: Positive for chest pain (right-sided).  Gastrointestinal: Positive for abdominal pain. Negative for nausea, vomiting and diarrhea.  Genitourinary: Negative for dysuria, hematuria, vaginal bleeding and vaginal discharge.  Musculoskeletal: Negative for back pain and neck pain.       Chronic left ankle swelling  Skin: Negative for rash.  Neurological: Negative for headaches.  Hematological: Does not bruise/bleed easily.  Psychiatric/Behavioral: Negative for confusion.    Allergies  Review of patient's allergies indicates  no known allergies.  Home Medications   Prior to Admission medications   Medication Sig Start Date End Date Taking? Authorizing Provider  ALPRAZolam Duanne Moron) 1 MG tablet Take 1 mg by mouth at bedtime.   Yes Historical Provider, MD  FLUoxetine (PROZAC) 20 MG capsule Take 1 capsule (20 mg total) by mouth daily. 07/12/13  Yes Doran Heater, MD  HYDROcodone-acetaminophen (NORCO) 10-325 MG per tablet Take 1 tablet by mouth every 6 (six) hours as needed for moderate pain.    Yes Historical Provider, MD  hydrocortisone 1 % lotion Apply 1 application topically 2 (two) times daily. 05/17/14  Yes Carole Civil, MD  ibuprofen (ADVIL,MOTRIN) 800 MG tablet Take 800 mg by mouth every 8 (eight) hours as needed for moderate pain.   Yes Historical Provider, MD  traMADol (ULTRAM) 50 MG tablet Take 1 tablet (50 mg total) by mouth every 6 (six) hours as needed. 06/06/14   Mallory Sorrow, MD   Triage Vitals: BP 114/93  Temp(Src) 98.5 F (36.9 C) (Oral)  Ht 5\' 4"  (1.626 m)  Wt 170 lb (77.111 kg)  BMI 29.17 kg/m2  SpO2 97%  LMP 05/16/2014 Physical Exam  Nursing note and vitals reviewed. Constitutional: She is oriented to person, place, and time. She appears well-developed and well-nourished. No distress.  HENT:  Head: Normocephalic and atraumatic.  Eyes: Conjunctivae and EOM are normal.  Neck: Neck supple. No tracheal deviation present.  Cardiovascular: Normal rate, regular rhythm and normal heart sounds.   Pulmonary/Chest: Effort normal and breath sounds normal. No respiratory distress. She exhibits no tenderness.  Abdominal: Soft. Bowel sounds are normal. There is no tenderness.  Genitourinary:  No CVA tenderness  Musculoskeletal: Normal range of motion.  Neurological: She is alert and oriented to person, place, and time.  Skin: Skin is warm and dry.  Psychiatric: She has a normal mood and affect. Her behavior is normal.    ED Course  Procedures (including critical care time)  DIAGNOSTIC STUDIES: Oxygen Saturation is 97% on RA, Normal by my interpretation.    COORDINATION OF CARE: 9:54 AM- Will obtain UA and urine pregnancy test. Pt advised of plan for treatment and pt agrees.   Medications  0.9 %  sodium chloride infusion ( Intravenous Stopped 06/06/14 1339)  traMADol (ULTRAM) tablet 50 mg (not administered)  sodium chloride 0.9 % bolus 250 mL (0 mLs Intravenous Stopped 06/06/14 1204)  ondansetron (ZOFRAN) injection 4 mg (4 mg Intravenous Given 06/06/14  1130)  iohexol (OMNIPAQUE) 300 MG/ML solution 50 mL (50 mLs Oral Contrast Given 06/06/14 1045)  iohexol (OMNIPAQUE) 300 MG/ML solution 100 mL (100 mLs Intravenous Contrast Given 06/06/14 1158)    Labs Review Labs Reviewed  URINALYSIS, ROUTINE W REFLEX MICROSCOPIC - Abnormal; Notable for the following:    Specific Gravity, Urine >1.030 (*)    Hgb urine dipstick TRACE (*)    Leukocytes, UA SMALL (*)    All other components within normal limits  URINE MICROSCOPIC-ADD ON - Abnormal; Notable for the following:    Squamous Epithelial / LPF MANY (*)    Bacteria, UA MANY (*)    All other components within normal limits  COMPREHENSIVE METABOLIC PANEL - Abnormal; Notable for the following:    AST 89 (*)    ALT 72 (*)    All other components within normal limits  CBC WITH DIFFERENTIAL - Abnormal; Notable for the following:    Hemoglobin 16.1 (*)    HCT 48.0 (*)    MCV 101.7 (*)  MCH 34.1 (*)    All other components within normal limits  LIPASE, BLOOD  POC URINE PREG, ED   Results for orders placed during the hospital encounter of 06/06/14  URINALYSIS, ROUTINE W REFLEX MICROSCOPIC      Result Value Ref Range   Color, Urine YELLOW  YELLOW   APPearance CLEAR  CLEAR   Specific Gravity, Urine >1.030 (*) 1.005 - 1.030   pH 5.5  5.0 - 8.0   Glucose, UA NEGATIVE  NEGATIVE mg/dL   Hgb urine dipstick TRACE (*) NEGATIVE   Bilirubin Urine NEGATIVE  NEGATIVE   Ketones, ur NEGATIVE  NEGATIVE mg/dL   Protein, ur NEGATIVE  NEGATIVE mg/dL   Urobilinogen, UA 0.2  0.0 - 1.0 mg/dL   Nitrite NEGATIVE  NEGATIVE   Leukocytes, UA SMALL (*) NEGATIVE  URINE MICROSCOPIC-ADD ON      Result Value Ref Range   Squamous Epithelial / LPF MANY (*) RARE   WBC, UA 3-6  <3 WBC/hpf   RBC / HPF 3-6  <3 RBC/hpf   Bacteria, UA MANY (*) RARE  COMPREHENSIVE METABOLIC PANEL      Result Value Ref Range   Sodium 142  137 - 147 mEq/L   Potassium 4.5  3.7 - 5.3 mEq/L   Chloride 102  96 - 112 mEq/L   CO2 26  19 - 32  mEq/L   Glucose, Bld 83  70 - 99 mg/dL   BUN 8  6 - 23 mg/dL   Creatinine, Ser 0.84  0.50 - 1.10 mg/dL   Calcium 9.4  8.4 - 10.5 mg/dL   Total Protein 7.9  6.0 - 8.3 g/dL   Albumin 4.1  3.5 - 5.2 g/dL   AST 89 (*) 0 - 37 U/L   ALT 72 (*) 0 - 35 U/L   Alkaline Phosphatase 70  39 - 117 U/L   Total Bilirubin 0.3  0.3 - 1.2 mg/dL   GFR calc non Af Amer >90  >90 mL/min   GFR calc Af Amer >90  >90 mL/min   Anion gap 14  5 - 15  LIPASE, BLOOD      Result Value Ref Range   Lipase 22  11 - 59 U/L  CBC WITH DIFFERENTIAL      Result Value Ref Range   WBC 5.4  4.0 - 10.5 K/uL   RBC 4.72  3.87 - 5.11 MIL/uL   Hemoglobin 16.1 (*) 12.0 - 15.0 g/dL   HCT 48.0 (*) 36.0 - 46.0 %   MCV 101.7 (*) 78.0 - 100.0 fL   MCH 34.1 (*) 26.0 - 34.0 pg   MCHC 33.5  30.0 - 36.0 g/dL   RDW 12.8  11.5 - 15.5 %   Platelets 289  150 - 400 K/uL   Neutrophils Relative % 45  43 - 77 %   Neutro Abs 2.4  1.7 - 7.7 K/uL   Lymphocytes Relative 45  12 - 46 %   Lymphs Abs 2.4  0.7 - 4.0 K/uL   Monocytes Relative 6  3 - 12 %   Monocytes Absolute 0.3  0.1 - 1.0 K/uL   Eosinophils Relative 4  0 - 5 %   Eosinophils Absolute 0.2  0.0 - 0.7 K/uL   Basophils Relative 0  0 - 1 %   Basophils Absolute 0.0  0.0 - 0.1 K/uL  POC URINE PREG, ED      Result Value Ref Range   Preg Test, Ur NEGATIVE  NEGATIVE  Imaging Review Ct Abdomen Pelvis W Contrast  06/06/2014   CLINICAL DATA:  Lower abdominal pain  EXAM: CT ABDOMEN AND PELVIS WITH CONTRAST  TECHNIQUE: Multidetector CT imaging of the abdomen and pelvis was performed using the standard protocol following bolus administration of intravenous contrast.  CONTRAST:  7mL OMNIPAQUE IOHEXOL 300 MG/ML SOLN, 13mL OMNIPAQUE IOHEXOL 300 MG/ML SOLN  COMPARISON:  None.  FINDINGS: Lower Chest: The lung bases are clear. Visualized cardiac structures are within normal limits for size. No pericardial effusion. Unremarkable visualized distal thoracic esophagus.  Abdomen: Unremarkable CT  appearance of the spleen, adrenal glands and pancreas. Mild focal circumferential wall thickening in the region of the pylorus. No evidence of a surrounding inflammatory change. Normal hepatic contour and morphology. Diffuse low attenuation of the hepatic parenchyma suggests steatosis. Additionally, there is focal geographic hypoattenuation of greater degree in the medial segment of the left hepatic lobe adjacent to the falciform ligament consistent with an area of a more profound focal fatty infiltration. There is slight sparing around the gallbladder fossa. The hepatic and portal veins remain patent and unremarkable.  Unremarkable appearance of the bilateral kidneys. No focal solid lesion, hydronephrosis or nephrolithiasis.  No evidence of obstruction or focal bowel wall thickening. Normal appendix in the right lower quadrant. The terminal ileum is unremarkable. No free fluid or suspicious adenopathy.  Pelvis: 4.9 x 4.8 cm heterogeneous fat attenuation round mass arising from the right ovary is most consistent with an ovarian dermoid cyst no significant inflammatory stranding around the dermoid. The left ovary, uterus and bladder are unremarkable.  Bones/Soft Tissues: No acute fracture or aggressive appearing lytic or blastic osseous lesion.  Vascular: No significant atherosclerotic vascular disease, aneurysmal dilatation or acute abnormality.  IMPRESSION: 1. Heterogeneous 4.9 x 4.8 cm ovarian dermoid cyst arising from the right ovary. The presence of this mass may increase the risk for ovarian torsion. If there is clinical suspicion for torsion, transvaginal duplex ultrasound could be performed for further evaluation. 2. Hepatic steatosis. 3. The appendix is normal. These results were called by telephone at the time of interpretation on 06/06/2014 at 12:28 pm to Dr. Fredia Mendoza , who verbally acknowledged these results.   Electronically Signed   By: Jacqulynn Cadet M.D.   On: 06/06/2014 12:28     EKG  Interpretation None      MDM   Final diagnoses:  Lower abdominal pain  Dermoid cyst of right ovary    The patient presenting with lower corner abdominal pain predominantly suprapubic area. Onset this morning. Past medical history of urinary tract infections her urinalysis today and consistent with UTI. CT scan shows a large dermoid cyst of the right ovary may be responsible for the pain. No evidence of torsion at this time but is at risk for that. Patient referred to OB/GYN for further evaluation.  Also while patient was in the ED developed a rash non-like erythematous papules no vesicles to her left anterior arm. Could be the beginning of a contact dermatitis. Not clear at this time. Patient will use over-the-counter hydrocortisone cream to treat that. Patient recommended for the abdominal pain to use Motrin and tramadol provided.  I personally performed the services described in this documentation, which was scribed in my presence. The recorded information has been reviewed and is accurate.     Mallory Sorrow, MD 06/06/14 [phone]

## 2014-06-07 ENCOUNTER — Ambulatory Visit (INDEPENDENT_AMBULATORY_CARE_PROVIDER_SITE_OTHER): Payer: Medicaid Other | Admitting: Obstetrics & Gynecology

## 2014-06-07 ENCOUNTER — Telehealth: Payer: Self-pay | Admitting: Obstetrics & Gynecology

## 2014-06-07 ENCOUNTER — Encounter: Payer: Self-pay | Admitting: Obstetrics & Gynecology

## 2014-06-07 VITALS — BP 142/98 | Ht 65.0 in | Wt 169.0 lb

## 2014-06-07 DIAGNOSIS — D270 Benign neoplasm of right ovary: Secondary | ICD-10-CM | POA: Insufficient documentation

## 2014-06-07 DIAGNOSIS — D279 Benign neoplasm of unspecified ovary: Secondary | ICD-10-CM

## 2014-06-07 NOTE — Telephone Encounter (Signed)
Pt states saw Dr. Elonda Husky today was going to give her medication for rash on arms but pharmacy does not have RX.

## 2014-06-07 NOTE — Progress Notes (Signed)
Patient ID: Mallory Mendoza, female   DOB: May 23, 1981, 33 y.o.   MRN: [id number] Chief Complaint  Patient presents with  . 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  EXAM Abdomen:      soft, nondistended Vulva:            normal appearing vulva with no masses, tenderness or lesions Vagina:          normal mucosa, no discharge  Results for orders placed during the hospital encounter of 06/06/14 (from the past 48 hour(s))  URINALYSIS, ROUTINE W REFLEX MICROSCOPIC   Collection Time    06/06/14  9:34 AM      Result Value Ref Range   Color, Urine YELLOW  YELLOW   APPearance CLEAR  CLEAR   Specific Gravity, Urine >1.030 (*) 1.005 - 1.030   pH 5.5  5.0 - 8.0   Glucose, UA NEGATIVE  NEGATIVE mg/dL   Hgb urine dipstick TRACE (*) NEGATIVE   Bilirubin Urine NEGATIVE  NEGATIVE   Ketones, ur NEGATIVE  NEGATIVE mg/dL   Protein, ur NEGATIVE  NEGATIVE mg/dL   Urobilinogen, UA 0.2  0.0 - 1.0 mg/dL   Nitrite NEGATIVE  NEGATIVE   Leukocytes, UA SMALL (*) NEGATIVE  URINE MICROSCOPIC-ADD ON   Collection Time    06/06/14  9:34 AM      Result Value Ref Range   Squamous Epithelial / LPF MANY (*) RARE   WBC, UA 3-6  <3 WBC/hpf   RBC / HPF 3-6  <3 RBC/hpf   Bacteria, UA MANY (*) RARE  POC URINE PREG, ED   Collection Time    06/06/14  9:35 AM      Result Value Ref Range   Preg Test, Ur NEGATIVE  NEGATIVE  COMPREHENSIVE METABOLIC PANEL   Collection Time    06/06/14 10:42 AM      Result Value Ref Range   Sodium 142  137 - 147 mEq/L   Potassium 4.5  3.7 - 5.3 mEq/L   Chloride 102  96 - 112 mEq/L   CO2 26   19 - 32 mEq/L   Glucose, Bld 83  70 - 99 mg/dL   BUN 8  6 - 23 mg/dL   Creatinine, Ser 0.84  0.50 - 1.10 mg/dL   Calcium 9.4  8.4 - 10.5 mg/dL   Total Protein 7.9  6.0 - 8.3 g/dL   Albumin 4.1  3.5 - 5.2 g/dL   AST 89 (*) 0 - 37 U/L   ALT 72 (*) 0 - 35 U/L   Alkaline Phosphatase 70  39 - 117 U/L   Total Bilirubin 0.3  0.3 - 1.2 mg/dL   GFR calc non Af Amer >90  >90 mL/min   GFR calc Af Amer >90  >90 mL/min   Anion gap 14  5 - 15  LIPASE, BLOOD   Collection Time    06/06/14 10:42 AM      Result Value Ref Range   Lipase 22  11 - 59 U/L  CBC WITH DIFFERENTIAL   Collection Time  06/06/14 10:42 AM      Result Value Ref Range   WBC 5.4  4.0 - 10.5 K/uL   RBC 4.72  3.87 - 5.11 MIL/uL   Hemoglobin 16.1 (*) 12.0 - 15.0 g/dL   HCT 48.0 (*) 36.0 - 46.0 %   MCV 101.7 (*) 78.0 - 100.0 fL   MCH 34.1 (*) 26.0 - 34.0 pg   MCHC 33.5  30.0 - 36.0 g/dL   RDW 12.8  11.5 - 15.5 %   Platelets 289  150 - 400 K/uL   Neutrophils Relative % 45  43 - 77 %   Neutro Abs 2.4  1.7 - 7.7 K/uL   Lymphocytes Relative 45  12 - 46 %   Lymphs Abs 2.4  0.7 - 4.0 K/uL   Monocytes Relative 6  3 - 12 %   Monocytes Absolute 0.3  0.1 - 1.0 K/uL   Eosinophils Relative 4  0 - 5 %   Eosinophils Absolute 0.2  0.0 - 0.7 K/uL   Basophils Relative 0  0 - 1 %   Basophils Absolute 0.0  0.0 - 0.1 K/uL     Ct Abdomen Pelvis W Contrast  06/06/2014   CLINICAL DATA:  Lower abdominal pain  EXAM: CT ABDOMEN AND PELVIS WITH CONTRAST  TECHNIQUE: Multidetector CT imaging of the abdomen and pelvis was performed using the standard protocol following bolus administration of intravenous contrast.  CONTRAST:  35mL OMNIPAQUE IOHEXOL 300 MG/ML SOLN, 131mL OMNIPAQUE IOHEXOL 300 MG/ML SOLN  COMPARISON:  None.  FINDINGS: Lower Chest: The lung bases are clear. Visualized cardiac structures are within normal limits for size. No pericardial effusion. Unremarkable visualized distal thoracic esophagus.  Abdomen: Unremarkable CT appearance of  the spleen, adrenal glands and pancreas. Mild focal circumferential wall thickening in the region of the pylorus. No evidence of a surrounding inflammatory change. Normal hepatic contour and morphology. Diffuse low attenuation of the hepatic parenchyma suggests steatosis. Additionally, there is focal geographic hypoattenuation of greater degree in the medial segment of the left hepatic lobe adjacent to the falciform ligament consistent with an area of a more profound focal fatty infiltration. There is slight sparing around the gallbladder fossa. The hepatic and portal veins remain patent and unremarkable.  Unremarkable appearance of the bilateral kidneys. No focal solid lesion, hydronephrosis or nephrolithiasis.  No evidence of obstruction or focal bowel wall thickening. Normal appendix in the right lower quadrant. The terminal ileum is unremarkable. No free fluid or suspicious adenopathy.  Pelvis: 4.9 x 4.8 cm heterogeneous fat attenuation round mass arising from the right ovary is most consistent with an ovarian dermoid cyst no significant inflammatory stranding around the dermoid. The left ovary, uterus and bladder are unremarkable.  Bones/Soft Tissues: No acute fracture or aggressive appearing lytic or blastic osseous lesion.  Vascular: No significant atherosclerotic vascular disease, aneurysmal dilatation or acute abnormality.  IMPRESSION: 1. Heterogeneous 4.9 x 4.8 cm ovarian dermoid cyst arising from the right ovary. The presence of this mass may increase the risk for ovarian torsion. If there is clinical suspicion for torsion, transvaginal duplex ultrasound could be performed for further evaluation. 2. Hepatic steatosis. 3. The appendix is normal. These results were called by telephone at the time of interpretation on 06/06/2014 at 12:28 pm to Dr. Fredia Sorrow , who verbally acknowledged these results.   Electronically Signed   By: Jacqulynn Cadet M.D.   On: 06/06/2014 12:28     Assessment/Plan:  Benign cystic teratoma with RLQ pain: pt wants removal  06/20/2014            

## 2014-06-08 ENCOUNTER — Encounter (HOSPITAL_COMMUNITY): Payer: Self-pay | Admitting: Pharmacy Technician

## 2014-06-08 MED ORDER — PREDNISONE 10 MG PO TABS: ORAL_TABLET | ORAL | Status: DC

## 2014-06-08 NOTE — Telephone Encounter (Signed)
Left message letting pt know Prednisone was e scribed to pharmacy. [place]

## 2014-06-11 ENCOUNTER — Encounter (HOSPITAL_COMMUNITY): Payer: Medicaid Other | Admitting: Anesthesiology

## 2014-06-11 ENCOUNTER — Emergency Department (HOSPITAL_COMMUNITY): Payer: Medicaid Other | Admitting: Anesthesiology

## 2014-06-11 ENCOUNTER — Emergency Department (HOSPITAL_COMMUNITY): Payer: Medicaid Other

## 2014-06-11 ENCOUNTER — Observation Stay (HOSPITAL_COMMUNITY)
Admission: EM | Admit: 2014-06-11 | Discharge: 2014-06-12 | Disposition: A | Discharge status: Home / Self Care | Payer: Medicaid Other | Attending: Otolaryngology | Admitting: Otolaryngology

## 2014-06-11 ENCOUNTER — Encounter (HOSPITAL_COMMUNITY): Payer: Self-pay | Admitting: Anesthesiology

## 2014-06-11 ENCOUNTER — Encounter (HOSPITAL_COMMUNITY)
Admission: EM | Disposition: A | Discharge status: Home / Self Care | Payer: Self-pay | Source: Home / Self Care | Attending: Emergency Medicine

## 2014-06-11 DIAGNOSIS — S025XXA Fracture of tooth (traumatic), initial encounter for closed fracture: Secondary | ICD-10-CM | POA: Insufficient documentation

## 2014-06-11 DIAGNOSIS — S01501A Unspecified open wound of lip, initial encounter: Secondary | ICD-10-CM | POA: Diagnosis not present

## 2014-06-11 DIAGNOSIS — S12000A Unspecified displaced fracture of first cervical vertebra, initial encounter for closed fracture: Secondary | ICD-10-CM | POA: Diagnosis not present

## 2014-06-11 DIAGNOSIS — S060XAA Concussion with loss of consciousness status unknown, initial encounter: Secondary | ICD-10-CM | POA: Diagnosis not present

## 2014-06-11 DIAGNOSIS — S0003XA Contusion of scalp, initial encounter: Secondary | ICD-10-CM | POA: Diagnosis not present

## 2014-06-11 DIAGNOSIS — F1010 Alcohol abuse, uncomplicated: Secondary | ICD-10-CM | POA: Insufficient documentation

## 2014-06-11 DIAGNOSIS — S0100XA Unspecified open wound of scalp, initial encounter: Secondary | ICD-10-CM | POA: Insufficient documentation

## 2014-06-11 DIAGNOSIS — Z87891 Personal history of nicotine dependence: Secondary | ICD-10-CM | POA: Diagnosis not present

## 2014-06-11 DIAGNOSIS — S0292XA Unspecified fracture of facial bones, initial encounter for closed fracture: Secondary | ICD-10-CM

## 2014-06-11 DIAGNOSIS — S0083XA Contusion of other part of head, initial encounter: Secondary | ICD-10-CM | POA: Insufficient documentation

## 2014-06-11 DIAGNOSIS — S060X9A Concussion with loss of consciousness of unspecified duration, initial encounter: Secondary | ICD-10-CM | POA: Insufficient documentation

## 2014-06-11 DIAGNOSIS — S022XXA Fracture of nasal bones, initial encounter for closed fracture: Principal | ICD-10-CM | POA: Insufficient documentation

## 2014-06-11 DIAGNOSIS — IMO0002 Reserved for concepts with insufficient information to code with codable children: Secondary | ICD-10-CM

## 2014-06-11 DIAGNOSIS — S02400A Malar fracture unspecified, initial encounter for closed fracture: Secondary | ICD-10-CM | POA: Insufficient documentation

## 2014-06-11 DIAGNOSIS — S02670B Fracture of alveolus of mandible, unspecified side, initial encounter for open fracture: Secondary | ICD-10-CM | POA: Diagnosis not present

## 2014-06-11 DIAGNOSIS — S0101XA Laceration without foreign body of scalp, initial encounter: Secondary | ICD-10-CM

## 2014-06-11 DIAGNOSIS — F10929 Alcohol use, unspecified with intoxication, unspecified: Secondary | ICD-10-CM | POA: Diagnosis present

## 2014-06-11 DIAGNOSIS — S01511A Laceration without foreign body of lip, initial encounter: Secondary | ICD-10-CM

## 2014-06-11 DIAGNOSIS — S0093XA Contusion of unspecified part of head, initial encounter: Secondary | ICD-10-CM

## 2014-06-11 DIAGNOSIS — S02401A Maxillary fracture, unspecified, initial encounter for closed fracture: Secondary | ICD-10-CM | POA: Diagnosis not present

## 2014-06-11 DIAGNOSIS — S1093XA Contusion of unspecified part of neck, initial encounter: Secondary | ICD-10-CM

## 2014-06-11 DIAGNOSIS — T07XXXA Unspecified multiple injuries, initial encounter: Secondary | ICD-10-CM

## 2014-06-11 DIAGNOSIS — S129XXA Fracture of neck, unspecified, initial encounter: Secondary | ICD-10-CM

## 2014-06-11 HISTORY — PX: LACERATION REPAIR: SHX5284

## 2014-06-11 HISTORY — PX: CLOSED REDUCTION NASAL FRACTURE: SHX5365

## 2014-06-11 HISTORY — PX: ORIF MANDIBULAR FRACTURE: SHX2127

## 2014-06-11 HISTORY — PX: OTHER SURGICAL HISTORY: SHX169

## 2014-06-11 LAB — URINE MICROSCOPIC-ADD ON

## 2014-06-11 LAB — URINALYSIS, ROUTINE W REFLEX MICROSCOPIC
Bilirubin Urine: NEGATIVE
Glucose, UA: NEGATIVE mg/dL
Ketones, ur: NEGATIVE mg/dL
Leukocytes, UA: NEGATIVE
Nitrite: NEGATIVE
Protein, ur: 100 mg/dL — AB
Specific Gravity, Urine: 1.041 — ABNORMAL HIGH (ref 1.005–1.030)
Urobilinogen, UA: 1 mg/dL (ref 0.0–1.0)
pH: 6 (ref 5.0–8.0)

## 2014-06-11 LAB — I-STAT CHEM 8, ED
BUN: 4 mg/dL — ABNORMAL LOW (ref 6–23)
Calcium, Ion: 1.11 mmol/L — ABNORMAL LOW (ref 1.12–1.23)
Chloride: 105 mEq/L (ref 96–112)
Creatinine, Ser: 1.3 mg/dL — ABNORMAL HIGH (ref 0.50–1.10)
Glucose, Bld: 106 mg/dL — ABNORMAL HIGH (ref 70–99)
HCT: 49 % — ABNORMAL HIGH (ref 36.0–46.0)
Hemoglobin: 16.7 g/dL — ABNORMAL HIGH (ref 12.0–15.0)
Potassium: 3.2 mEq/L — ABNORMAL LOW (ref 3.7–5.3)
Sodium: 143 mEq/L (ref 137–147)
TCO2: 23 mmol/L (ref 0–100)

## 2014-06-11 LAB — CBC
HCT: 43.6 % (ref 36.0–46.0)
Hemoglobin: 14.6 g/dL (ref 12.0–15.0)
MCH: 34.5 pg — ABNORMAL HIGH (ref 26.0–34.0)
MCHC: 33.5 g/dL (ref 30.0–36.0)
MCV: 103.1 fL — ABNORMAL HIGH (ref 78.0–100.0)
Platelets: 281 10*3/uL (ref 150–400)
RBC: 4.23 MIL/uL (ref 3.87–5.11)
RDW: 13.3 % (ref 11.5–15.5)
WBC: 9 10*3/uL (ref 4.0–10.5)

## 2014-06-11 LAB — COMPREHENSIVE METABOLIC PANEL
ALT: 140 U/L — ABNORMAL HIGH (ref 0–35)
AST: 285 U/L — ABNORMAL HIGH (ref 0–37)
Albumin: 3.9 g/dL (ref 3.5–5.2)
Alkaline Phosphatase: 63 U/L (ref 39–117)
Anion gap: 17 — ABNORMAL HIGH (ref 5–15)
BUN: 6 mg/dL (ref 6–23)
CO2: 24 mEq/L (ref 19–32)
Calcium: 8.7 mg/dL (ref 8.4–10.5)
Chloride: 103 mEq/L (ref 96–112)
Creatinine, Ser: 0.85 mg/dL (ref 0.50–1.10)
GFR calc Af Amer: >90 mL/min (ref >90)
GFR calc non Af Amer: 90 mL/min — ABNORMAL LOW (ref >90)
Glucose, Bld: 104 mg/dL — ABNORMAL HIGH (ref 70–99)
Potassium: 3.4 mEq/L — ABNORMAL LOW (ref 3.7–5.3)
Sodium: 144 mEq/L (ref 137–147)
Total Bilirubin: 0.3 mg/dL (ref 0.3–1.2)
Total Protein: 7.5 g/dL (ref 6.0–8.3)

## 2014-06-11 LAB — PROTIME-INR
INR: 0.89 (ref 0.00–1.49)
Prothrombin Time: 12 seconds (ref 11.6–15.2)

## 2014-06-11 LAB — ETHANOL: Alcohol, Ethyl (B): 252 mg/dL — ABNORMAL HIGH (ref 0–11)

## 2014-06-11 LAB — SAMPLE TO BLOOD BANK

## 2014-06-11 LAB — POC URINE PREG, ED: Preg Test, Ur: NEGATIVE

## 2014-06-11 LAB — CDS SEROLOGY

## 2014-06-11 SURGERY — OPEN REDUCTION INTERNAL FIXATION (ORIF) MANDIBULAR FRACTURE
Anesthesia: General

## 2014-06-11 MED ORDER — ONDANSETRON HCL 4 MG/2ML IJ SOLN
INTRAMUSCULAR | Status: AC
  Filled 2014-06-11: qty 2

## 2014-06-11 MED ORDER — OXYMETAZOLINE HCL 0.05 % NA SOLN
NASAL | Status: DC | PRN
  Administered 2014-06-11: 2 via NASAL

## 2014-06-11 MED ORDER — ROCURONIUM BROMIDE 50 MG/5ML IV SOLN
INTRAVENOUS | Status: AC
Start: 2014-06-11 — End: 2014-06-11
  Filled 2014-06-11: qty 1

## 2014-06-11 MED ORDER — GLYCOPYRROLATE 0.2 MG/ML IJ SOLN
INTRAMUSCULAR | Status: AC
  Filled 2014-06-11: qty 2

## 2014-06-11 MED ORDER — ARTIFICIAL TEARS OP OINT
TOPICAL_OINTMENT | OPHTHALMIC | Status: AC
  Filled 2014-06-11: qty 3.5

## 2014-06-11 MED ORDER — NEOSTIGMINE METHYLSULFATE 10 MG/10ML IV SOLN
INTRAVENOUS | Status: AC
  Filled 2014-06-11: qty 1

## 2014-06-11 MED ORDER — DOCUSATE SODIUM 100 MG PO CAPS
100.0000 mg | ORAL_CAPSULE | Freq: Two times a day (BID) | ORAL | Status: DC
  Administered 2014-06-12: 100 mg via ORAL
  Filled 2014-06-11: qty 1

## 2014-06-11 MED ORDER — BACITRACIN ZINC 500 UNIT/GM EX OINT
TOPICAL_OINTMENT | CUTANEOUS | Status: AC
  Filled 2014-06-11: qty 15

## 2014-06-11 MED ORDER — OXYMETAZOLINE HCL 0.05 % NA SOLN
NASAL | Status: AC
  Filled 2014-06-11: qty 15

## 2014-06-11 MED ORDER — IOHEXOL 300 MG/ML  SOLN
100.0000 mL | Freq: Once | INTRAMUSCULAR | Status: AC | PRN
  Administered 2014-06-11: 100 mL via INTRAVENOUS

## 2014-06-11 MED ORDER — PANTOPRAZOLE SODIUM 40 MG PO TBEC
40.0000 mg | DELAYED_RELEASE_TABLET | Freq: Every day | ORAL | Status: DC
  Administered 2014-06-12: 40 mg via ORAL
  Filled 2014-06-11: qty 1

## 2014-06-11 MED ORDER — LIDOCAINE-EPINEPHRINE 1 %-1:100000 IJ SOLN
INTRAMUSCULAR | Status: AC
  Filled 2014-06-11: qty 1

## 2014-06-11 MED ORDER — CEFAZOLIN SODIUM-DEXTROSE 2-3 GM-% IV SOLR
2000.0000 mg | Freq: Three times a day (TID) | INTRAVENOUS | Status: AC
  Administered 2014-06-11 – 2014-06-12 (×3): 2000 mg via INTRAVENOUS
  Filled 2014-06-11 (×3): qty 50

## 2014-06-11 MED ORDER — NEOSTIGMINE METHYLSULFATE 10 MG/10ML IV SOLN
INTRAVENOUS | Status: DC | PRN
  Administered 2014-06-11: 4 mg via INTRAVENOUS

## 2014-06-11 MED ORDER — METRONIDAZOLE 500 MG PO TABS
500.0000 mg | ORAL_TABLET | Freq: Two times a day (BID) | ORAL | Status: DC
  Administered 2014-06-11 – 2014-06-12 (×2): 500 mg via ORAL
  Filled 2014-06-11 (×3): qty 1

## 2014-06-11 MED ORDER — BACITRACIN ZINC 500 UNIT/GM EX OINT
1.0000 "application " | TOPICAL_OINTMENT | Freq: Two times a day (BID) | CUTANEOUS | Status: DC
  Administered 2014-06-11 – 2014-06-12 (×2): 1 via TOPICAL
  Filled 2014-06-11 (×2): qty 15
  Filled 2014-06-11: qty 28.35

## 2014-06-11 MED ORDER — MIDAZOLAM HCL 2 MG/2ML IJ SOLN: 0.5000 mg | Freq: Once | INTRAMUSCULAR | Status: DC | PRN

## 2014-06-11 MED ORDER — PROPOFOL 10 MG/ML IV BOLUS
INTRAVENOUS | Status: AC
  Filled 2014-06-11: qty 20

## 2014-06-11 MED ORDER — CEFAZOLIN SODIUM-DEXTROSE 2-3 GM-% IV SOLR
INTRAVENOUS | Status: DC | PRN
  Administered 2014-06-11: 2 g via INTRAVENOUS

## 2014-06-11 MED ORDER — PROMETHAZINE HCL 25 MG/ML IJ SOLN: 6.2500 mg | INTRAMUSCULAR | Status: DC | PRN

## 2014-06-11 MED ORDER — DEXAMETHASONE SODIUM PHOSPHATE 10 MG/ML IJ SOLN
INTRAMUSCULAR | Status: DC | PRN
  Administered 2014-06-11: 10 mg via INTRAVENOUS

## 2014-06-11 MED ORDER — PROPOFOL 10 MG/ML IV BOLUS
INTRAVENOUS | Status: DC | PRN
  Administered 2014-06-11: 150 mg via INTRAVENOUS

## 2014-06-11 MED ORDER — POTASSIUM CHLORIDE IN NACL 20-0.45 MEQ/L-% IV SOLN
INTRAVENOUS | Status: DC
  Administered 2014-06-11 – 2014-06-12 (×2): via INTRAVENOUS
  Filled 2014-06-11 (×4): qty 1000

## 2014-06-11 MED ORDER — BACITRACIN ZINC 500 UNIT/GM EX OINT
1.0000 "application " | TOPICAL_OINTMENT | Freq: Three times a day (TID) | CUTANEOUS | Status: DC
  Administered 2014-06-11 – 2014-06-12 (×3): 1 via TOPICAL
  Filled 2014-06-11: qty 15

## 2014-06-11 MED ORDER — HYDROMORPHONE HCL PF 1 MG/ML IJ SOLN: 0.2500 mg | INTRAMUSCULAR | Status: DC | PRN

## 2014-06-11 MED ORDER — GLYCOPYRROLATE 0.2 MG/ML IJ SOLN
INTRAMUSCULAR | Status: DC | PRN
  Administered 2014-06-11: 0.6 mg via INTRAVENOUS

## 2014-06-11 MED ORDER — ARTIFICIAL TEARS OP OINT
TOPICAL_OINTMENT | OPHTHALMIC | Status: DC | PRN
  Administered 2014-06-11: 1 via OPHTHALMIC

## 2014-06-11 MED ORDER — SUCCINYLCHOLINE CHLORIDE 20 MG/ML IJ SOLN
INTRAMUSCULAR | Status: DC | PRN
  Administered 2014-06-11: 100 mg via INTRAVENOUS

## 2014-06-11 MED ORDER — ENOXAPARIN SODIUM 40 MG/0.4ML ~~LOC~~ SOLN
40.0000 mg | SUBCUTANEOUS | Status: DC
  Administered 2014-06-12: 40 mg via SUBCUTANEOUS
  Filled 2014-06-11: qty 0.4

## 2014-06-11 MED ORDER — ONDANSETRON HCL 4 MG/2ML IJ SOLN: 4.0000 mg | Freq: Four times a day (QID) | INTRAMUSCULAR | Status: DC | PRN

## 2014-06-11 MED ORDER — ALPRAZOLAM 0.5 MG PO TABS
2.0000 mg | ORAL_TABLET | Freq: Every evening | ORAL | Status: DC | PRN
  Administered 2014-06-11: 2 mg via ORAL
  Filled 2014-06-11: qty 4

## 2014-06-11 MED ORDER — FENTANYL CITRATE 0.05 MG/ML IJ SOLN
INTRAMUSCULAR | Status: AC
  Filled 2014-06-11: qty 5

## 2014-06-11 MED ORDER — 0.9 % SODIUM CHLORIDE (POUR BTL) OPTIME
TOPICAL | Status: DC | PRN
  Administered 2014-06-11: 1000 mL

## 2014-06-11 MED ORDER — OXYCODONE HCL 5 MG PO TABS: 5.0000 mg | ORAL_TABLET | Freq: Once | ORAL | Status: DC | PRN

## 2014-06-11 MED ORDER — HYDROCODONE-ACETAMINOPHEN 5-325 MG PO TABS
0.5000 | ORAL_TABLET | ORAL | Status: DC | PRN
  Administered 2014-06-11 – 2014-06-12 (×2): 2 via ORAL
  Filled 2014-06-11 (×2): qty 2

## 2014-06-11 MED ORDER — ONDANSETRON HCL 4 MG PO TABS: 4.0000 mg | ORAL_TABLET | Freq: Four times a day (QID) | ORAL | Status: DC | PRN

## 2014-06-11 MED ORDER — OXYCODONE HCL 5 MG/5ML PO SOLN: 5.0000 mg | Freq: Once | ORAL | Status: DC | PRN

## 2014-06-11 MED ORDER — ROCURONIUM BROMIDE 100 MG/10ML IV SOLN
INTRAVENOUS | Status: DC | PRN
  Administered 2014-06-11: 35 mg via INTRAVENOUS

## 2014-06-11 MED ORDER — PANTOPRAZOLE SODIUM 40 MG IV SOLR
40.0000 mg | Freq: Every day | INTRAVENOUS | Status: DC
  Administered 2014-06-11: 40 mg via INTRAVENOUS
  Filled 2014-06-11: qty 40

## 2014-06-11 MED ORDER — LIDOCAINE-EPINEPHRINE 1 %-1:100000 IJ SOLN
INTRAMUSCULAR | Status: DC | PRN
  Administered 2014-06-11: 2 mL

## 2014-06-11 MED ORDER — FENTANYL CITRATE 0.05 MG/ML IJ SOLN
INTRAMUSCULAR | Status: DC | PRN
  Administered 2014-06-11: 100 ug via INTRAVENOUS
  Administered 2014-06-11: 50 ug via INTRAVENOUS

## 2014-06-11 MED ORDER — CEFAZOLIN SODIUM-DEXTROSE 2-3 GM-% IV SOLR
INTRAVENOUS | Status: AC
  Filled 2014-06-11: qty 50

## 2014-06-11 MED ORDER — MORPHINE SULFATE 2 MG/ML IJ SOLN
2.0000 mg | INTRAMUSCULAR | Status: DC | PRN
  Administered 2014-06-11 – 2014-06-12 (×3): 2 mg via INTRAVENOUS
  Filled 2014-06-11 (×3): qty 1

## 2014-06-11 MED ORDER — MIDAZOLAM HCL 2 MG/2ML IJ SOLN
INTRAMUSCULAR | Status: AC
  Filled 2014-06-11: qty 2

## 2014-06-11 MED ORDER — MEPERIDINE HCL 25 MG/ML IJ SOLN: 6.2500 mg | INTRAMUSCULAR | Status: DC | PRN

## 2014-06-11 MED ORDER — NAPROXEN 500 MG PO TABS
500.0000 mg | ORAL_TABLET | Freq: Two times a day (BID) | ORAL | Status: DC
  Administered 2014-06-11 – 2014-06-12 (×2): 500 mg via ORAL
  Filled 2014-06-11: qty 2
  Filled 2014-06-11 (×2): qty 1
  Filled 2014-06-11: qty 2
  Filled 2014-06-11 (×2): qty 1

## 2014-06-11 MED ORDER — ONDANSETRON HCL 4 MG/2ML IJ SOLN
INTRAMUSCULAR | Status: DC | PRN
  Administered 2014-06-11: 4 mg via INTRAVENOUS

## 2014-06-11 MED ORDER — LIDOCAINE-EPINEPHRINE 1 %-1:100000 IJ SOLN
INTRAMUSCULAR | Status: AC
  Filled 2014-06-11: qty 2

## 2014-06-11 MED ORDER — POLYETHYLENE GLYCOL 3350 17 G PO PACK
17.0000 g | PACK | Freq: Every day | ORAL | Status: DC
  Administered 2014-06-12: 17 g via ORAL
  Filled 2014-06-11: qty 1

## 2014-06-11 MED ORDER — LACTATED RINGERS IV SOLN
INTRAVENOUS | Status: DC | PRN
  Administered 2014-06-11 (×2): via INTRAVENOUS

## 2014-06-11 SURGICAL SUPPLY — 61 items
BENZOIN TINCTURE PRP APPL 2/3 (GAUZE/BANDAGES/DRESSINGS) ×3 IMPLANT
CANISTER SUCTION 2500CC (MISCELLANEOUS) ×3 IMPLANT
CLEANER TIP ELECTROSURG 2X2 (MISCELLANEOUS) ×3 IMPLANT
CLOSURE STERI-STRIP 1/4X4 (GAUZE/BANDAGES/DRESSINGS) ×3 IMPLANT
CLOSURE WOUND 1/2 X4 (GAUZE/BANDAGES/DRESSINGS) ×1
CORDS BIPOLAR (ELECTRODE) IMPLANT
COVER SURGICAL LIGHT HANDLE (MISCELLANEOUS) ×3 IMPLANT
DECANTER SPIKE VIAL GLASS SM (MISCELLANEOUS) ×3 IMPLANT
DRAIN CHANNEL 7F FF FLAT (WOUND CARE) IMPLANT
ELECT COATED BLADE 2.86 ST (ELECTRODE) ×3 IMPLANT
ELECT NEEDLE TIP 2.8 STRL (NEEDLE) IMPLANT
ELECT REM PT RETURN 9FT ADLT (ELECTROSURGICAL) ×3
ELECTRODE REM PT RTRN 9FT ADLT (ELECTROSURGICAL) ×1 IMPLANT
EVACUATOR SILICONE 100CC (DRAIN) IMPLANT
GLOVE BIOGEL M 7.0 STRL (GLOVE) ×6 IMPLANT
GLOVE BIOGEL PI IND STRL 7.0 (GLOVE) ×2 IMPLANT
GLOVE BIOGEL PI INDICATOR 7.0 (GLOVE) ×4
GLOVE SURG SS PI 7.0 STRL IVOR (GLOVE) ×6 IMPLANT
GOWN STRL REUS W/ TWL LRG LVL3 (GOWN DISPOSABLE) ×2 IMPLANT
GOWN STRL REUS W/TWL LRG LVL3 (GOWN DISPOSABLE) ×4
KIT BASIN OR (CUSTOM PROCEDURE TRAY) ×3 IMPLANT
KIT ROOM TURNOVER OR (KITS) ×3 IMPLANT
LOCATOR NERVE 3 VOLT (DISPOSABLE) IMPLANT
NEEDLE HYPO 25X1 1.5 SAFETY (NEEDLE) ×3 IMPLANT
NS IRRIG 1000ML POUR BTL (IV SOLUTION) ×3 IMPLANT
PACK EENT II TURBAN DRAPE (CUSTOM PROCEDURE TRAY) ×3 IMPLANT
PAD ARMBOARD 7.5X6 YLW CONV (MISCELLANEOUS) ×6 IMPLANT
PATTIES SURGICAL .5 X3 (DISPOSABLE) IMPLANT
PENCIL BUTTON HOLSTER BLD 10FT (ELECTRODE) ×3 IMPLANT
SCISSORS WIRE DISP (INSTRUMENTS) ×3 IMPLANT
SPLINT NASAL THERMO PLAST (MISCELLANEOUS) ×3 IMPLANT
SPONGE NEURO XRAY DETECT 1X3 (DISPOSABLE) ×3 IMPLANT
STAPLER VISISTAT 35W (STAPLE) ×3 IMPLANT
STRIP CLOSURE SKIN 1/2X4 (GAUZE/BANDAGES/DRESSINGS) ×2 IMPLANT
SUT BONE WAX W31G (SUTURE) IMPLANT
SUT CHROMIC 3 0 PS 2 (SUTURE) IMPLANT
SUT CHROMIC 4 0 RB 1X27 (SUTURE) ×12 IMPLANT
SUT ETHILON 3 0 PS 1 (SUTURE) IMPLANT
SUT ETHILON 4 0 P 3 18 (SUTURE) IMPLANT
SUT ETHILON 5 0 P 3 18 (SUTURE) ×2
SUT ETHILON 5 0 PS 2 18 (SUTURE) IMPLANT
SUT NYLON ETHILON 5-0 P-3 1X18 (SUTURE) ×1 IMPLANT
SUT SILK 2 0 FS (SUTURE) IMPLANT
SUT SILK 3 0 (SUTURE)
SUT SILK 3 0 REEL (SUTURE) IMPLANT
SUT SILK 3-0 18XBRD TIE 12 (SUTURE) IMPLANT
SUT STEEL 0 (SUTURE)
SUT STEEL 0 18XMFL TIE 17 (SUTURE) IMPLANT
SUT STEEL 2 (SUTURE) ×3 IMPLANT
SUT VIC AB 3-0 FS2 27 (SUTURE) IMPLANT
SUT VIC AB 3-0 PS2 18 (SUTURE)
SUT VIC AB 3-0 PS2 18XBRD (SUTURE) IMPLANT
SUT VIC AB 4-0 P-3 18X BRD (SUTURE) IMPLANT
SUT VIC AB 4-0 P3 18 (SUTURE)
SUT VIC AB 5-0 P-3 18XBRD (SUTURE) IMPLANT
SUT VIC AB 5-0 P3 18 (SUTURE)
SYR CONTROL 10ML LL (SYRINGE) ×3 IMPLANT
TOWEL OR 17X24 6PK STRL BLUE (TOWEL DISPOSABLE) ×6 IMPLANT
TOWEL OR 17X26 10 PK STRL BLUE (TOWEL DISPOSABLE) ×3 IMPLANT
TRAY ENT MC OR (CUSTOM PROCEDURE TRAY) ×3 IMPLANT
WATER STERILE IRR 1000ML POUR (IV SOLUTION) ×3 IMPLANT

## 2014-06-11 NOTE — ED Notes (Signed)
Last ate at 2300 and drank soda at 0600 today
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
## 2014-06-11 NOTE — Anesthesia Postprocedure Evaluation (Signed)
  Anesthesia Post-op Note  Patient: Mallory Mendoza  Procedure(s) Performed: Procedure(s): Reduction of mandibular alveolar fracture (N/A) REPAIR COMPLEX LIP LACERATION (N/A) CLOSED REDUCTION NASAL FRACTURE (N/A)  Patient Location: PACU  Anesthesia Type:General  Level of Consciousness: awake, oriented, patient cooperative and responds to stimulation  Airway and Oxygen Therapy: Patient Spontanous Breathing and Patient connected to nasal cannula oxygen  Post-op Pain: none  Post-op Assessment: Post-op Vital signs reviewed, Patient's Cardiovascular Status Stable, Respiratory Function Stable, Patent Airway, No signs of Nausea or vomiting and Pain level controlled  Post-op Vital Signs: Reviewed and stable  Last Vitals:  Filed Vitals:   06/11/14 1345  BP: 143/99  Pulse:   Temp: 36.9 C  Resp:     Complications: No apparent anesthesia complications

## 2014-06-11 NOTE — Procedures (Signed)
Tilla Wilborn, MD, MPH, FACS Trauma: [phone] General Surgery: [phone]  
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
## 2014-06-11 NOTE — ED Notes (Signed)
Dr Saintclair Halsted at bedside to see pt

## 2014-06-11 NOTE — Transfer of Care (Signed)
Immediate Anesthesia Transfer of Care Note  Patient: Mallory Mendoza  Procedure(s) Performed: Procedure(s): Reduction of mandibular alveolar fracture (N/A) REPAIR COMPLEX LIP LACERATION (N/A) CLOSED REDUCTION NASAL FRACTURE (N/A)  Patient Location: PACU  Anesthesia Type:General  Level of Consciousness: awake, alert  and oriented  Airway & Oxygen Therapy: Patient Spontanous Breathing and Patient connected to face mask oxygen  Post-op Assessment: Report given to PACU RN  Post vital signs: Reviewed and stable  Complications: No apparent anesthesia complications

## 2014-06-11 NOTE — Brief Op Note (Signed)
06/11/2014  12:41 PM  PATIENT:  Mallory Mendoza  33 y.o. female  PRE-OPERATIVE DIAGNOSIS:  REPAIR LACERATION, REPAIR NASAL FRACTURE, REPAIR DENTAL/JAW FRACTURE  POST-OPERATIVE DIAGNOSIS:  REPAIR LACERATION, REPAIR NASAL FRACTURE, REPAIR DENTAL/JAW FRACTURE  PROCEDURE:   1. RECONSTRUCTION OF 8cm LACERATION OF LOWER LIP AND ORAL CAVITY 2. CLOSED REDUCTION NASAL FRACTURE WITH EXTERNAL SPLINT 3. OPEN REDUCTION ALVEOLAR FRACTURE  SURGEON:  Surgeon(s) and Role:    * Jerrell Belfast, MD - Primary  PHYSICIAN ASSISTANT:   ASSISTANTS: none   ANESTHESIA:   general  EBL:  Total I/O In: 2000 [I.V.:2000] Out: 25 [Blood:25]  BLOOD ADMINISTERED:none  DRAINS: none   LOCAL MEDICATIONS USED:  LIDOCAINE  and Amount: 2 ml  SPECIMEN:  No Specimen  DISPOSITION OF SPECIMEN:  N/A  COUNTS:  YES  TOURNIQUET:  * No tourniquets in log *  DICTATION: .Other Dictation: Dictation Number [id number]  PLAN OF CARE: Admit to inpatient   PATIENT DISPOSITION:  PACU - hemodynamically stable.   Delay start of Pharmacological VTE agent (>24hrs) due to surgical blood loss or risk of bleeding: not applicable

## 2014-06-11 NOTE — H&P (Signed)
Patient examined and I agree with the assessment and plan [facility] D/W Dr. Wilburn Cornelia who plans facial repair in OR and Dr. Saintclair Halsted who recommends continuing hard cervical collar. Georganna Skeans, MD, MPH, FACS Trauma: [phone] General Surgery: [phone]  06/11/2014 9:54 AM

## 2014-06-11 NOTE — ED Notes (Signed)
Trauma end [place]

## 2014-06-11 NOTE — Progress Notes (Signed)
Patient arrived to department [facility] via hospital bed.  VSS.  Aspen collar in place.  Nose has scant bloody drainage present.  Head lac with staples intact and no draining.  Patient easy to arouse.  Oriented pt and mother to call bell, bed, room and dept [place].  Will cont to monitor.

## 2014-06-11 NOTE — ED Notes (Signed)
Vital signs stable. 
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
## 2014-06-12 ENCOUNTER — Observation Stay (HOSPITAL_COMMUNITY): Payer: Medicaid Other

## 2014-06-12 ENCOUNTER — Encounter (HOSPITAL_COMMUNITY): Payer: Self-pay | Admitting: General Practice

## 2014-06-12 ENCOUNTER — Encounter (INDEPENDENT_AMBULATORY_CARE_PROVIDER_SITE_OTHER): Payer: Self-pay | Admitting: Orthopedic Surgery

## 2014-06-12 DIAGNOSIS — F10929 Alcohol use, unspecified with intoxication, unspecified: Secondary | ICD-10-CM | POA: Diagnosis present

## 2014-06-12 LAB — PREPARE FRESH FROZEN PLASMA
Unit division: 0
Unit division: 0

## 2014-06-12 LAB — CBC
HCT: 37 % (ref 36.0–46.0)
Hemoglobin: 12 g/dL (ref 12.0–15.0)
MCH: 33.4 pg (ref 26.0–34.0)
MCHC: 32.4 g/dL (ref 30.0–36.0)
MCV: 103.1 fL — ABNORMAL HIGH (ref 78.0–100.0)
Platelets: 215 10*3/uL (ref 150–400)
RBC: 3.59 MIL/uL — ABNORMAL LOW (ref 3.87–5.11)
RDW: 13.1 % (ref 11.5–15.5)
WBC: 9.6 10*3/uL (ref 4.0–10.5)

## 2014-06-12 LAB — BASIC METABOLIC PANEL
Anion gap: 12 (ref 5–15)
BUN: 4 mg/dL — ABNORMAL LOW (ref 6–23)
CO2: 24 mEq/L (ref 19–32)
Calcium: 8 mg/dL — ABNORMAL LOW (ref 8.4–10.5)
Chloride: 102 mEq/L (ref 96–112)
Creatinine, Ser: 0.65 mg/dL (ref 0.50–1.10)
GFR calc Af Amer: >90 mL/min (ref >90)
GFR calc non Af Amer: >90 mL/min (ref >90)
Glucose, Bld: 121 mg/dL — ABNORMAL HIGH (ref 70–99)
Potassium: 4.1 mEq/L (ref 3.7–5.3)
Sodium: 138 mEq/L (ref 137–147)

## 2014-06-12 MED ORDER — METRONIDAZOLE 500 MG PO TABS: 500.0000 mg | ORAL_TABLET | Freq: Two times a day (BID) | ORAL | Status: DC

## 2014-06-12 MED ORDER — HYDROCODONE-ACETAMINOPHEN 5-325 MG PO TABS: 1.0000 | ORAL_TABLET | ORAL | Status: DC | PRN

## 2014-06-12 MED ORDER — IBUPROFEN 200 MG PO TABS: 800.0000 mg | ORAL_TABLET | Freq: Three times a day (TID) | ORAL | Status: DC

## 2014-06-12 NOTE — Progress Notes (Signed)
Up in chair. Wounds look good. Will see how it goes with therapies. Seen and agree. Georganna Skeans, MD, MPH, FACS Trauma: [phone] General Surgery: [phone]
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
## 2014-06-12 NOTE — Progress Notes (Signed)
Utilization review completed.  
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
## 2014-06-12 NOTE — Discharge Summary (Signed)
Yardley Lekas, MD, MPH, FACS Trauma: [phone] General Surgery: [phone]  

## 2014-06-12 NOTE — Progress Notes (Signed)
Orthopedic Tech Progress Note Patient Details:  Mallory Mendoza [birth year] [id number] Brace applied by [employer] vendor Patient ID: [name], female   DOB: 06-28-1981, 33 y.o.   MRN: [id number]   Braulio Bosch 06/12/2014, 3:31 PM
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
## 2014-06-12 NOTE — Discharge Instructions (Signed)
Keep neck collar on at all times.  No driving while taking hydrocodone.

## 2014-06-12 NOTE — Progress Notes (Signed)
Pt. Discharge to home. Discharge instructions given.  No question verbalized.
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
## 2014-06-14 ENCOUNTER — Ambulatory Visit (INDEPENDENT_AMBULATORY_CARE_PROVIDER_SITE_OTHER): Payer: Medicaid Other | Admitting: Orthopedic Surgery

## 2014-06-14 VITALS — BP 148/94 | Ht 65.0 in | Wt 169.0 lb

## 2014-06-14 DIAGNOSIS — R21 Rash and other nonspecific skin eruption: Secondary | ICD-10-CM

## 2014-06-14 MED ORDER — HYDROCODONE-ACETAMINOPHEN 10-325 MG PO TABS: 1.0000 | ORAL_TABLET | Freq: Four times a day (QID) | ORAL | Status: DC | PRN
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
## 2014-06-15 ENCOUNTER — Encounter (HOSPITAL_COMMUNITY)
Admission: RE | Admit: 2014-06-15 | Discharge: 2014-06-15 | Disposition: A | Discharge status: Home / Self Care | Payer: Medicaid Other | Source: Ambulatory Visit | Attending: Obstetrics & Gynecology | Admitting: Obstetrics & Gynecology

## 2014-06-15 NOTE — Patient Instructions (Signed)
Mallory Mendoza  06/15/2014   Your procedure is scheduled on:   06/20/2014  Report to [facility] at  31  AM.  Call this number if you have problems the morning of surgery: [phone]   Remember:   Do not eat food or drink liquids after midnight.   Take these medicines the morning of surgery with A SIP OF WATER:  Xanax, prozac, hydrocodone, prednisone, ultram   Do not wear jewelry, make-up or nail polish.  Do not wear lotions, powders, or perfumes.   Do not shave 48 hours prior to surgery. Men may shave face and neck.  Do not bring valuables to the hospital.  [facility] is not responsible for any belongings or valuables.               Contacts, dentures or bridgework may not be worn into surgery.  Leave suitcase in the car. After surgery it may be brought to your room.  For patients admitted to the hospital, discharge time is determined by your treatment team.               Patients discharged the day of surgery will not be allowed to drive home.  Name and phone number of your driver: family  Special Instructions: Shower using CHG 2 nights before surgery and the night before surgery.  If you shower the day of surgery use CHG.  Use special wash - you have one bottle of CHG for all showers.  You should use approximately 1/3 of the bottle for each shower.   Please read over the following fact sheets that you were given: Pain Booklet, Coughing and Deep Breathing, Surgical Site Infection Prevention, Anesthesia Post-op Instructions and Care and Recovery After Surgery Diagnostic Laparoscopy Laparoscopy is a surgical procedure. It is used to diagnose and treat diseases inside the belly (abdomen). It is usually a brief, common, and relatively simple procedure. The laparoscopeis a thin, lighted, pencil-sized instrument. It is like a telescope. It is inserted into your abdomen through a small cut (incision). Your caregiver can look at the organs inside your body through this instrument.  He or she can see if there is anything abnormal. Laparoscopy can be done either in a hospital or outpatient clinic. You may be given a mild sedative to help you relax before the procedure. Once in the operating room, you will be given a drug to make you sleep (general anesthesia). Laparoscopy usually lasts less than 1 hour. After the procedure, you will be monitored in a recovery area until you are stable and doing well. Once you are home, it will take 2 to 3 days to fully recover. RISKS AND COMPLICATIONS  Laparoscopy has relatively few risks. Your caregiver will discuss the risks with you before the procedure. Some problems that can occur include:  Infection.  Bleeding.  Damage to other organs.  Anesthetic side effects. PROCEDURE Once you receive anesthesia, your surgeon inflates the abdomen with a harmless gas (carbon dioxide). This makes the organs easier to see. The laparoscope is inserted into the abdomen through a small incision. This allows your surgeon to see into the abdomen. Other small instruments are also inserted into the abdomen through other small openings. Many surgeons attach a video camera to the laparoscope to enlarge the view. During a diagnostic laparoscopy, the surgeon may be looking for inflammation, infection, or cancer. Your surgeon may take tissue samples(biopsies). The samples are sent to a specialist in looking at cells  and tissue samples (pathologist). The pathologist examines them under a microscope. Biopsies can help to diagnose or confirm a disease. AFTER THE PROCEDURE   The gas is released from inside the abdomen.  The incisions are closed with stitches (sutures). Because these incisions are small (usually less than 1/2 inch), there is usually minimal discomfort after the procedure. There may be some mild discomfort in the throat. This is from the tube placed in the throat while you were sleeping. You may have some mild abdominal discomfort. There may also be  discomfort from the instrument placement incisions in the abdomen.  The recovery time is shortened as long as there are no complications.  You will rest in a recovery room until stable and doing well. As long as there are no complications, you may be allowed to go home. FINDING OUT THE RESULTS OF YOUR TEST Not all test results are available during your visit. If your test results are not back during the visit, make an appointment with your caregiver to find out the results. Do not assume everything is normal if you have not heard from your caregiver or the medical facility. It is important for you to follow up on all of your test results. HOME CARE INSTRUCTIONS   Take all medicines as directed.  Only take over-the-counter or prescription medicines for pain, discomfort, or fever as directed by your caregiver.  Resume daily activities as directed.  Showers are preferred over baths.  You may resume sexual activities in 1 week or as directed.  Do not drive while taking narcotics. SEEK MEDICAL CARE IF:   There is increasing abdominal pain.  There is new pain in the shoulders (shoulder strap areas).  You feel lightheaded or faint.  You have the chills.  You or your child has an oral temperature above 102 F (38.9 C).  There is pus-like (purulent) drainage from any of the wounds.  You are unable to pass gas or have a bowel movement.  You feel sick to your stomach (nauseous) or throw up (vomit). MAKE SURE YOU:   Understand these instructions.  Will watch your condition.  Will get help right away if you are not doing well or get worse. Document Released: 02/08/2001 Document Revised: 02/27/2013 Document Reviewed: 11/02/2007 [facility] Patient Information 2015 [place], [place]. This information is not intended to replace advice given to you by your health care provider. Make sure you discuss any questions you have with your health care provider. Unilateral  Salpingo-Oophorectomy Unilateral salpingo-oophorectomy is the surgical removal of one fallopian tube and ovary. The ovaries are small organs that produce eggs in women. The fallopian tubes transport the egg from the ovary to the womb (uterus). A unilateral salpingo-oophorectomy may be done for various reasons, including:  Infection in the fallopian tube and ovary.  Scar tissue in the fallopian tube and ovary (adhesions).  A cyst or tumor on the ovary.  A need to remove the fallopian tube and ovary when removing the uterus.  Cancer of the fallopian tube or ovary. The removal of one fallopian tube and ovary will not prevent you from becoming pregnant, put you into menopause, or cause problems with your menstrual periods or sex drive. LET [facility] CARE PROVIDER KNOW ABOUT:  Any allergies you have.  All medicines you are taking, including vitamins, herbs, eye drops, creams, and over-the-counter medicines.  Previous problems you or members of your family have had with the use of anesthetics.  Any blood disorders you have.  Previous surgeries  you have had.  Medical conditions you have. RISKS AND COMPLICATIONS  Generally, this is a safe procedure. However, as with any procedure, complications can occur. Possible complications include:  Injury to surrounding organs.  Bleeding.  Infection.  Blood clots in the legs or lungs.  Problems related to anesthesia. BEFORE THE PROCEDURE  Ask your health care provider about changing or stopping your regular medicines. You may need to stop taking certain medicines, such as aspirin or blood thinners, at least 1 week before the surgery.  Do not eat or drink anything for at least 8 hours before the surgery.  If you smoke, do not smoke for at least 2 weeks before the surgery.  Make plans to have someone drive you home after the procedure or after your hospital stay. Also arrange for someone to help you with activities during  recovery. PROCEDURE  You will be given medicine to help you relax before the procedure (sedative). You will then be given medicine to make you sleep through the procedure (general anesthetic). These medicines will be given through an IV access tube that is put into one of your veins.  Once you are asleep, your lower abdomen will be shaved and cleaned. A thin, flexible tube (catheter) will be placed in your bladder.  The surgeon may use a laparoscopic, robotic, or open technique for this surgery:  In the laparoscopic technique, the surgery is done through two small cuts (incisions) in the abdomen. A thin, lighted tube with a tiny camera on the end (laparoscope) is inserted into one of the incisions. The tools needed for the procedure are put through the other incision.  A robotic technique may be chosen to perform complex surgery in a small space. In the robotic technique, small incisions are made. A camera and surgical instruments are passed through the incisions. Surgical instruments are controlled with the help of a robotic arm.  In the open technique, the surgery is done through one large incision in the abdomen.  Using any of these techniques, the surgeon will remove the fallopian tube and ovary. The blood vessels will be clamped and tied.  The surgeon will then use staples or stitches to close the incision or incisions. AFTER THE PROCEDURE  You will be taken to a recovery area where your progress will be monitored for 1-3 hours. Your blood pressure, pulse, and temperature will be checked often. You will remain in the recovery area until you are stable and waking up.  If the laparoscopic technique was used, you may be allowed to go home after several hours. You may have some shoulder pain. This is normal and usually goes away in a day or two.  If the open technique was used, you will be admitted to the hospital for a couple of days.  You will be given pain medicine as necessary.  The  IV tube and catheter will be removed before you are discharged. Document Released: 08/30/2009 Document Revised: 11/07/2013 Document Reviewed: 04/26/2013 [facility] Patient Information 2015 [place], [place]. This information is not intended to replace advice given to you by your health care provider. Make sure you discuss any questions you have with your health care provider. PATIENT INSTRUCTIONS POST-ANESTHESIA  IMMEDIATELY FOLLOWING SURGERY:  Do not drive or operate machinery for the first twenty four hours after surgery.  Do not make any important decisions for twenty four hours after surgery or while taking narcotic pain medications or sedatives.  If you develop intractable nausea and vomiting or a severe headache  please notify your doctor immediately.  FOLLOW-UP:  Please make an appointment with your surgeon as instructed. You do not need to follow up with anesthesia unless specifically instructed to do so.  WOUND CARE INSTRUCTIONS (if applicable):  Keep a dry clean dressing on the anesthesia/puncture wound site if there is drainage.  Once the wound has quit draining you may leave it open to air.  Generally you should leave the bandage intact for twenty four hours unless there is drainage.  If the epidural site drains for more than 36-48 hours please call the anesthesia department.  QUESTIONS?:  Please feel free to call your physician or the hospital operator if you have any questions, and they will be happy to assist you.

## 2014-06-20 ENCOUNTER — Encounter (HOSPITAL_COMMUNITY): Admission: RE | Payer: Self-pay | Source: Ambulatory Visit

## 2014-06-20 ENCOUNTER — Ambulatory Visit (INDEPENDENT_AMBULATORY_CARE_PROVIDER_SITE_OTHER): Payer: Medicaid Other | Admitting: General Surgery

## 2014-06-20 ENCOUNTER — Ambulatory Visit (HOSPITAL_COMMUNITY)
Admission: RE | Admit: 2014-06-20 | Payer: Medicaid Other | Source: Ambulatory Visit | Admitting: Obstetrics & Gynecology

## 2014-06-20 ENCOUNTER — Encounter (INDEPENDENT_AMBULATORY_CARE_PROVIDER_SITE_OTHER): Payer: Self-pay

## 2014-06-20 VITALS — BP 114/80 | HR 72 | Temp 99.2°F | Resp 14 | Ht 64.0 in | Wt 168.2 lb

## 2014-06-20 DIAGNOSIS — S0101XS Laceration without foreign body of scalp, sequela: Secondary | ICD-10-CM

## 2014-06-20 DIAGNOSIS — IMO0002 Reserved for concepts with insufficient information to code with codable children: Secondary | ICD-10-CM

## 2014-06-20 SURGERY — SALPINGO-OOPHORECTOMY, LAPAROSCOPIC
Anesthesia: General | Site: Vagina | Laterality: Right
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
## 2014-06-20 NOTE — Patient Instructions (Signed)
Mallory Mendoza---this is the neurosurgeon you need to follow up with in about 3 weeks  [phone]  [address] [place],  [id number]

## 2014-06-27 ENCOUNTER — Encounter: Payer: Medicaid Other | Admitting: Obstetrics & Gynecology

## 2014-07-26 ENCOUNTER — Ambulatory Visit: Payer: Medicaid Other | Admitting: Orthopedic Surgery

## 2014-07-27 ENCOUNTER — Encounter: Payer: Self-pay | Admitting: Obstetrics & Gynecology

## 2014-08-07 ENCOUNTER — Encounter: Payer: Self-pay | Admitting: Orthopedic Surgery

## 2014-08-07 ENCOUNTER — Ambulatory Visit (INDEPENDENT_AMBULATORY_CARE_PROVIDER_SITE_OTHER): Payer: Medicaid Other | Admitting: Orthopedic Surgery

## 2014-08-07 VITALS — BP 137/95 | Ht 64.0 in | Wt 168.2 lb

## 2014-08-07 DIAGNOSIS — M25579 Pain in unspecified ankle and joints of unspecified foot: Secondary | ICD-10-CM

## 2014-08-07 DIAGNOSIS — G8929 Other chronic pain: Secondary | ICD-10-CM

## 2014-08-07 DIAGNOSIS — R21 Rash and other nonspecific skin eruption: Secondary | ICD-10-CM

## 2014-08-07 DIAGNOSIS — L03119 Cellulitis of unspecified part of limb: Secondary | ICD-10-CM

## 2014-08-07 DIAGNOSIS — L02419 Cutaneous abscess of limb, unspecified: Secondary | ICD-10-CM

## 2014-08-07 DIAGNOSIS — L03116 Cellulitis of left lower limb: Secondary | ICD-10-CM

## 2014-08-07 DIAGNOSIS — M25571 Pain in right ankle and joints of right foot: Principal | ICD-10-CM

## 2014-08-07 MED ORDER — HYDROCORTISONE 1 % EX LOTN: 1.0000 "application " | TOPICAL_LOTION | Freq: Two times a day (BID) | CUTANEOUS | Status: DC

## 2014-08-07 MED ORDER — HYDROCODONE-ACETAMINOPHEN 10-325 MG PO TABS: 1.0000 | ORAL_TABLET | Freq: Four times a day (QID) | ORAL | Status: DC | PRN
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
## 2014-08-07 NOTE — Patient Instructions (Signed)
CALL WHEN YOU ARE RELEASED FOR NECK INJURY

## 2014-09-04 ENCOUNTER — Other Ambulatory Visit: Payer: Self-pay | Admitting: *Deleted

## 2014-09-04 ENCOUNTER — Telehealth: Payer: Self-pay | Admitting: *Deleted

## 2014-09-04 DIAGNOSIS — M25579 Pain in unspecified ankle and joints of unspecified foot: Secondary | ICD-10-CM

## 2014-09-04 MED ORDER — HYDROCODONE-ACETAMINOPHEN 10-325 MG PO TABS: 1.0000 | ORAL_TABLET | Freq: Four times a day (QID) | ORAL | Status: DC | PRN

## 2014-09-04 NOTE — Telephone Encounter (Signed)
Patient called requesting refill on Hydrocodone 10/325 mg. Please advise.

## 2014-09-04 NOTE — Telephone Encounter (Signed)
Prescription available, patient aware  

## 2014-10-04 ENCOUNTER — Other Ambulatory Visit: Payer: Self-pay | Admitting: *Deleted

## 2014-10-04 ENCOUNTER — Telehealth: Payer: Self-pay | Admitting: Orthopedic Surgery

## 2014-10-04 DIAGNOSIS — M25579 Pain in unspecified ankle and joints of unspecified foot: Secondary | ICD-10-CM

## 2014-10-04 MED ORDER — IBUPROFEN 200 MG PO TABS: 800.0000 mg | ORAL_TABLET | Freq: Three times a day (TID) | ORAL | Status: DC

## 2014-10-04 MED ORDER — IBUPROFEN 800 MG PO TABS: 800.0000 mg | ORAL_TABLET | Freq: Three times a day (TID) | ORAL | Status: DC | PRN

## 2014-10-04 MED ORDER — HYDROCODONE-ACETAMINOPHEN 10-325 MG PO TABS: 1.0000 | ORAL_TABLET | Freq: Four times a day (QID) | ORAL | Status: DC | PRN

## 2014-10-04 NOTE — Telephone Encounter (Signed)
Patient picked up Rx

## 2014-10-04 NOTE — Telephone Encounter (Signed)
Prescription available, patient aware  

## 2014-10-04 NOTE — Telephone Encounter (Signed)
PATIENT CALLING ASKING FOR A REFILL ON PAIN MEDHYDROcodone-acetaminophen (NORCO) 10-325 MG per tablet [[id number]] ANDibuprofen (ADVIL,MOTRIN) 200 MG tablet PLEASE ADVISE?

## 2014-10-21 ENCOUNTER — Emergency Department (HOSPITAL_COMMUNITY)
Admission: EM | Admit: 2014-10-21 | Discharge: 2014-10-21 | Disposition: A | Discharge status: Home / Self Care | Payer: MEDICAID | Attending: Emergency Medicine | Admitting: Emergency Medicine

## 2014-10-21 ENCOUNTER — Encounter (HOSPITAL_COMMUNITY): Payer: Self-pay | Admitting: Emergency Medicine

## 2014-10-21 DIAGNOSIS — R251 Tremor, unspecified: Secondary | ICD-10-CM | POA: Insufficient documentation

## 2014-10-21 DIAGNOSIS — F329 Major depressive disorder, single episode, unspecified: Secondary | ICD-10-CM | POA: Diagnosis not present

## 2014-10-21 DIAGNOSIS — G479 Sleep disorder, unspecified: Secondary | ICD-10-CM | POA: Insufficient documentation

## 2014-10-21 DIAGNOSIS — F419 Anxiety disorder, unspecified: Secondary | ICD-10-CM | POA: Insufficient documentation

## 2014-10-21 DIAGNOSIS — Z7952 Long term (current) use of systemic steroids: Secondary | ICD-10-CM | POA: Diagnosis not present

## 2014-10-21 DIAGNOSIS — R21 Rash and other nonspecific skin eruption: Secondary | ICD-10-CM | POA: Diagnosis present

## 2014-10-21 DIAGNOSIS — Z79899 Other long term (current) drug therapy: Secondary | ICD-10-CM | POA: Diagnosis not present

## 2014-10-21 DIAGNOSIS — Z720 Tobacco use: Secondary | ICD-10-CM | POA: Diagnosis not present

## 2014-10-21 LAB — CBC WITH DIFFERENTIAL/PLATELET
Basophils Absolute: 0 10*3/uL (ref 0.0–0.1)
Basophils Relative: 0 % (ref 0–1)
Eosinophils Absolute: 0.2 10*3/uL (ref 0.0–0.7)
Eosinophils Relative: 3 % (ref 0–5)
HCT: 44.1 % (ref 36.0–46.0)
Hemoglobin: 15.4 g/dL — ABNORMAL HIGH (ref 12.0–15.0)
Lymphocytes Relative: 24 % (ref 12–46)
Lymphs Abs: 1.5 10*3/uL (ref 0.7–4.0)
MCH: 34.4 pg — ABNORMAL HIGH (ref 26.0–34.0)
MCHC: 34.9 g/dL (ref 30.0–36.0)
MCV: 98.4 fL (ref 78.0–100.0)
Monocytes Absolute: 0.4 10*3/uL (ref 0.1–1.0)
Monocytes Relative: 6 % (ref 3–12)
Neutro Abs: 4.2 10*3/uL (ref 1.7–7.7)
Neutrophils Relative %: 67 % (ref 43–77)
Platelets: 187 10*3/uL (ref 150–400)
RBC: 4.48 MIL/uL (ref 3.87–5.11)
RDW: 13.5 % (ref 11.5–15.5)
WBC: 6.3 10*3/uL (ref 4.0–10.5)

## 2014-10-21 LAB — BASIC METABOLIC PANEL
Anion gap: 16 — ABNORMAL HIGH (ref 5–15)
BUN: 7 mg/dL (ref 6–23)
CO2: 24 mEq/L (ref 19–32)
Calcium: 10 mg/dL (ref 8.4–10.5)
Chloride: 100 mEq/L (ref 96–112)
Creatinine, Ser: 0.73 mg/dL (ref 0.50–1.10)
GFR calc Af Amer: >90 mL/min (ref >90)
GFR calc non Af Amer: >90 mL/min (ref >90)
Glucose, Bld: 113 mg/dL — ABNORMAL HIGH (ref 70–99)
Potassium: 3.7 mEq/L (ref 3.7–5.3)
Sodium: 140 mEq/L (ref 137–147)

## 2014-10-21 MED ORDER — LORAZEPAM 2 MG/ML IJ SOLN
1.0000 mg | Freq: Once | INTRAMUSCULAR | Status: AC
  Administered 2014-10-21: 1 mg via INTRAVENOUS
  Filled 2014-10-21: qty 1

## 2014-10-21 MED ORDER — LORAZEPAM 1 MG PO TABS: ORAL_TABLET | ORAL | Status: DC

## 2014-10-21 MED ORDER — HYDROMORPHONE HCL 1 MG/ML IJ SOLN
0.5000 mg | Freq: Once | INTRAMUSCULAR | Status: AC
  Administered 2014-10-21: 0.5 mg via INTRAVENOUS
  Filled 2014-10-21: qty 1

## 2014-10-21 MED ORDER — SODIUM CHLORIDE 0.9 % IV BOLUS (SEPSIS)
1000.0000 mL | Freq: Once | INTRAVENOUS | Status: AC
  Administered 2014-10-21: 1000 mL via INTRAVENOUS

## 2014-10-21 NOTE — ED Notes (Signed)
Pt reports started taking hydroxyzine last Thursday. Pt reports generalized shaking started this am. Pt reports had hives last night but "they went away." nad noted.

## 2014-10-21 NOTE — Discharge Instructions (Signed)
Follow up with your md this week for recheck  °
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
## 2014-10-29 ENCOUNTER — Telehealth: Payer: Self-pay | Admitting: Orthopedic Surgery

## 2014-10-29 ENCOUNTER — Other Ambulatory Visit: Payer: Self-pay | Admitting: *Deleted

## 2014-10-29 DIAGNOSIS — M25579 Pain in unspecified ankle and joints of unspecified foot: Secondary | ICD-10-CM

## 2014-10-29 MED ORDER — HYDROCODONE-ACETAMINOPHEN 10-325 MG PO TABS: 1.0000 | ORAL_TABLET | Freq: Four times a day (QID) | ORAL | Status: DC | PRN

## 2014-10-29 NOTE — Telephone Encounter (Signed)
Prescription available, patient aware  

## 2014-10-29 NOTE — Telephone Encounter (Signed)
Patient called, requesting refill of pain medication.  We have no appointments scheduled for patient.   HYDROcodone-acetaminophen ([place]) 10-325 MG per tablet [[id number]]  Ph# [phone]

## 2014-10-30 NOTE — Telephone Encounter (Signed)
Patient picked up Rx

## 2014-11-05 ENCOUNTER — Other Ambulatory Visit: Payer: Self-pay | Admitting: *Deleted

## 2014-11-05 DIAGNOSIS — L03116 Cellulitis of left lower limb: Secondary | ICD-10-CM

## 2014-11-05 MED ORDER — HYDROCORTISONE 1 % EX LOTN: 1.0000 "application " | TOPICAL_LOTION | Freq: Two times a day (BID) | CUTANEOUS | Status: DC

## 2014-11-29 ENCOUNTER — Other Ambulatory Visit: Payer: Self-pay | Admitting: *Deleted

## 2014-11-29 ENCOUNTER — Telehealth: Payer: Self-pay | Admitting: Orthopedic Surgery

## 2014-11-29 DIAGNOSIS — M25579 Pain in unspecified ankle and joints of unspecified foot: Secondary | ICD-10-CM

## 2014-11-29 MED ORDER — HYDROCODONE-ACETAMINOPHEN 10-325 MG PO TABS: 1.0000 | ORAL_TABLET | Freq: Four times a day (QID) | ORAL | Status: DC | PRN

## 2014-11-29 NOTE — Telephone Encounter (Signed)
Prescription available for pick up,patient is aware 

## 2014-11-29 NOTE — Telephone Encounter (Signed)
Patient called to request refill, pain medication: HYDROcodone-acetaminophen (NORCO) 10-325 MG per tablet [[id number]]   - No follow up appointments are scheduled at this time.  Patient's ph# [phone].

## 2015-01-01 ENCOUNTER — Telehealth: Payer: Self-pay | Admitting: Orthopedic Surgery

## 2015-01-01 ENCOUNTER — Other Ambulatory Visit: Payer: Self-pay | Admitting: *Deleted

## 2015-01-01 DIAGNOSIS — M25579 Pain in unspecified ankle and joints of unspecified foot: Secondary | ICD-10-CM

## 2015-01-01 MED ORDER — IBUPROFEN 800 MG PO TABS: 800.0000 mg | ORAL_TABLET | Freq: Three times a day (TID) | ORAL | Status: DC | PRN

## 2015-01-01 MED ORDER — HYDROCODONE-ACETAMINOPHEN 10-325 MG PO TABS: 1.0000 | ORAL_TABLET | Freq: Four times a day (QID) | ORAL | Status: DC | PRN

## 2015-01-01 NOTE — Telephone Encounter (Signed)
Patient is calling requesting a refill on pain medicationsHYDROcodone-acetaminophen (NORCO) 10-325 MG per tablet [[id number]] and ibuprofen (ADVIL,MOTRIN) 200 MG tablet [[id number]} please advise?

## 2015-01-01 NOTE — Telephone Encounter (Signed)
Prescription available, patient aware  

## 2015-01-02 NOTE — Telephone Encounter (Signed)
Patient picked up Rx

## 2015-01-23 ENCOUNTER — Telehealth: Payer: Self-pay | Admitting: Orthopedic Surgery

## 2015-01-23 ENCOUNTER — Other Ambulatory Visit: Payer: Self-pay | Admitting: *Deleted

## 2015-01-23 DIAGNOSIS — M25579 Pain in unspecified ankle and joints of unspecified foot: Secondary | ICD-10-CM

## 2015-01-23 MED ORDER — HYDROCODONE-ACETAMINOPHEN 10-325 MG PO TABS: 1.0000 | ORAL_TABLET | Freq: Four times a day (QID) | ORAL | Status: DC | PRN

## 2015-01-23 NOTE — Telephone Encounter (Signed)
Patient is calling requesting pain medication refill on HYDROcodone-acetaminophen (NORCO) 10-325 MG per tablet [[id number]]  And ibuprofen (ADVIL,MOTRIN) 200 MG tablet  Please advise?

## 2015-01-24 NOTE — Telephone Encounter (Signed)
Prescription available, patient aware  

## 2015-01-28 NOTE — Telephone Encounter (Signed)
Patient picked up Rx

## 2015-02-21 ENCOUNTER — Other Ambulatory Visit: Payer: Self-pay | Admitting: *Deleted

## 2015-02-21 ENCOUNTER — Telehealth: Payer: Self-pay | Admitting: Orthopedic Surgery

## 2015-02-21 DIAGNOSIS — M25579 Pain in unspecified ankle and joints of unspecified foot: Secondary | ICD-10-CM

## 2015-02-21 MED ORDER — HYDROCODONE-ACETAMINOPHEN 10-325 MG PO TABS: 1.0000 | ORAL_TABLET | Freq: Four times a day (QID) | ORAL | Status: DC | PRN

## 2015-02-21 NOTE — Telephone Encounter (Signed)
Patient is calling requesting pain medication refill on HYDROcodone-acetaminophen (NORCO) 10-325 MG per tablet and ibuprofen (ADVIL,MOTRIN) 200 MG tablet please advise?

## 2015-02-21 NOTE — Telephone Encounter (Signed)
Prescription available, patient aware  

## 2015-02-25 NOTE — Telephone Encounter (Signed)
Patient picked up Rx

## 2015-03-27 ENCOUNTER — Telehealth: Payer: Self-pay | Admitting: Orthopedic Surgery

## 2015-03-27 ENCOUNTER — Other Ambulatory Visit: Payer: Self-pay | Admitting: *Deleted

## 2015-03-27 MED ORDER — HYDROCODONE-ACETAMINOPHEN 7.5-325 MG PO TABS: 1.0000 | ORAL_TABLET | Freq: Four times a day (QID) | ORAL | Status: DC | PRN

## 2015-03-27 NOTE — Telephone Encounter (Signed)
Prescription available, patient aware  

## 2015-03-27 NOTE — Telephone Encounter (Signed)
Patient called to request refill of pain medication: HYDROcodone-acetaminophen ([place]) 10-325 MG per tablet [[id number]]  - she was last seen here 08/08/15 and has no follow up appointments scheduled (her insurance requires primary care referrals) - ph# [phone]

## 2015-03-28 NOTE — Telephone Encounter (Signed)
Patient picked up Rx

## 2015-04-24 ENCOUNTER — Other Ambulatory Visit: Payer: Self-pay | Admitting: *Deleted

## 2015-04-24 ENCOUNTER — Telehealth: Payer: Self-pay | Admitting: Orthopedic Surgery

## 2015-04-24 MED ORDER — HYDROCODONE-ACETAMINOPHEN 5-325 MG PO TABS: 1.0000 | ORAL_TABLET | Freq: Four times a day (QID) | ORAL | Status: DC | PRN

## 2015-04-24 NOTE — Telephone Encounter (Signed)
Patient is requesting a refill on medication HYDROcodone-acetaminophen (NORCO) 7.5-325 MG per tablet please advise?

## 2015-04-24 NOTE — Telephone Encounter (Signed)
Prescription available, patient aware  

## 2015-04-25 NOTE — Telephone Encounter (Signed)
Patient picked up Rx

## 2015-05-27 ENCOUNTER — Other Ambulatory Visit: Payer: Self-pay | Admitting: *Deleted

## 2015-05-27 ENCOUNTER — Telehealth: Payer: Self-pay | Admitting: Orthopedic Surgery

## 2015-05-27 MED ORDER — HYDROCODONE-ACETAMINOPHEN 5-325 MG PO TABS: 1.0000 | ORAL_TABLET | Freq: Four times a day (QID) | ORAL | Status: DC | PRN

## 2015-05-27 NOTE — Telephone Encounter (Signed)
Prescription available, patient aware to pick up at 4:30

## 2015-05-27 NOTE — Telephone Encounter (Signed)
Call received from patient, requests refill on HYDROcodone-acetaminophen (NORCO/VICODIN) 5-325 MG per tablet [[id number]]  - she has no scheduled appointments; last seen here 08/07/14.  Her phone # is [phone].

## 2015-06-25 ENCOUNTER — Other Ambulatory Visit: Payer: Self-pay | Admitting: *Deleted

## 2015-06-25 ENCOUNTER — Telehealth: Payer: Self-pay | Admitting: Orthopedic Surgery

## 2015-06-25 MED ORDER — HYDROCODONE-ACETAMINOPHEN 5-325 MG PO TABS: 1.0000 | ORAL_TABLET | Freq: Four times a day (QID) | ORAL | Status: DC | PRN

## 2015-06-25 NOTE — Telephone Encounter (Signed)
Patient is calling requesting a refill on medication HYDROcodone-acetaminophen (NORCO/VICODIN) 5-325 MG per tablet  Please advise?

## 2015-06-25 NOTE — Telephone Encounter (Signed)
Prescription available, called patient, no answer 

## 2015-07-29 ENCOUNTER — Other Ambulatory Visit: Payer: Self-pay | Admitting: *Deleted

## 2015-07-29 ENCOUNTER — Telehealth: Payer: Self-pay | Admitting: Orthopedic Surgery

## 2015-07-29 MED ORDER — HYDROCODONE-ACETAMINOPHEN 5-325 MG PO TABS
1.0000 | ORAL_TABLET | Freq: Four times a day (QID) | ORAL | Status: DC | PRN
Start: 2015-07-29 — End: 2015-09-02

## 2015-07-29 NOTE — Telephone Encounter (Signed)
Patient picked up Rx

## 2015-07-29 NOTE — Telephone Encounter (Signed)
Patient is calling requesting a refill on HYDROcodone-acetaminophen (NORCO/VICODIN) 5-325 MG per tablet please advise?

## 2015-07-29 NOTE — Telephone Encounter (Signed)
Prescription available, called patient, no answer 

## 2015-09-02 ENCOUNTER — Telehealth: Payer: Self-pay | Admitting: Orthopedic Surgery

## 2015-09-02 ENCOUNTER — Other Ambulatory Visit: Payer: Self-pay | Admitting: *Deleted

## 2015-09-02 MED ORDER — HYDROCODONE-ACETAMINOPHEN 5-325 MG PO TABS: 1.0000 | ORAL_TABLET | Freq: Four times a day (QID) | ORAL | Status: DC | PRN

## 2015-09-02 NOTE — Telephone Encounter (Signed)
Patient is calling requesting a refill on medication HYDROcodone-acetaminophen (NORCO/VICODIN) 5-325 MG per tablet please advise? 

## 2015-09-02 NOTE — Telephone Encounter (Signed)
Prescription available, patient aware  

## 2015-09-04 NOTE — Telephone Encounter (Signed)
Patient picked up Rx

## 2015-09-21 IMAGING — CT CT ABD-PELV W/ CM
2 of 4 series · 16 of 46 positions shown, 18 images · IV contrast (Omnipaque 300)
Comparison: None.

CLINICAL DATA: Lower abdominal pain

EXAM:
CT ABDOMEN AND PELVIS WITH CONTRAST
TECHNIQUE: Multidetector CT imaging of the abdomen and pelvis was performed
using the standard protocol following bolus administration of
intravenous contrast.
CONTRAST:  50mL OMNIPAQUE IOHEXOL 300 MG/ML SOLN, 100mL OMNIPAQUE
IOHEXOL 300 MG/ML SOLN

[Series 2: abd_pel_with 5.0 b40f · axial · 0.73mm/px · z∈[-685,-245]mm · 13 of 96 slices shown, 15 images]
[im 4/96  soft-tissue]
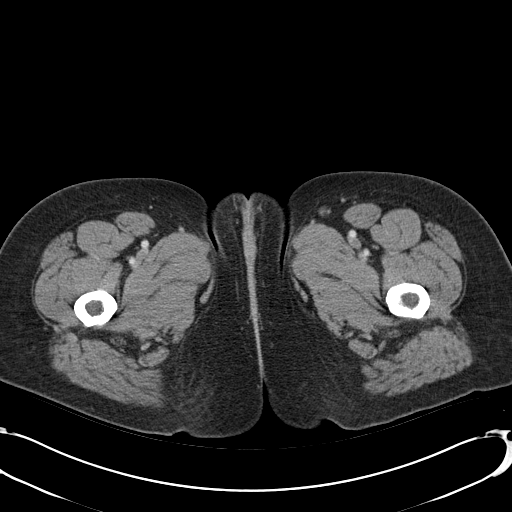
[im 4/96  bone]
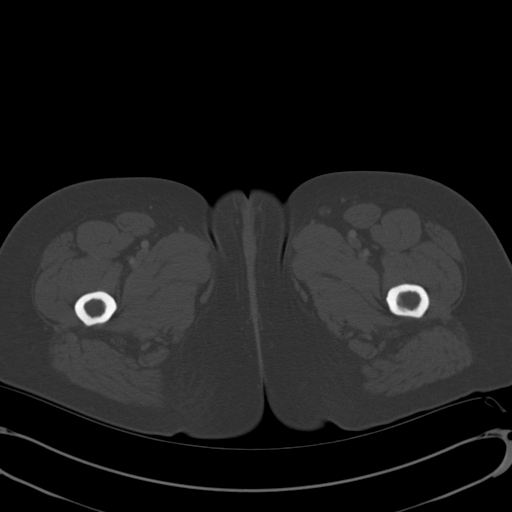
[im 12/96  soft-tissue]
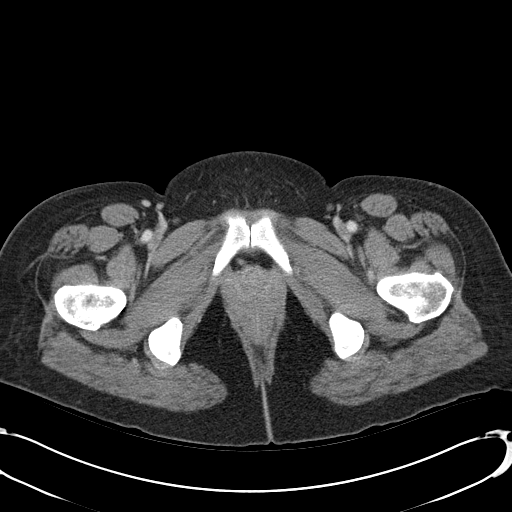
[im 20/96  soft-tissue]
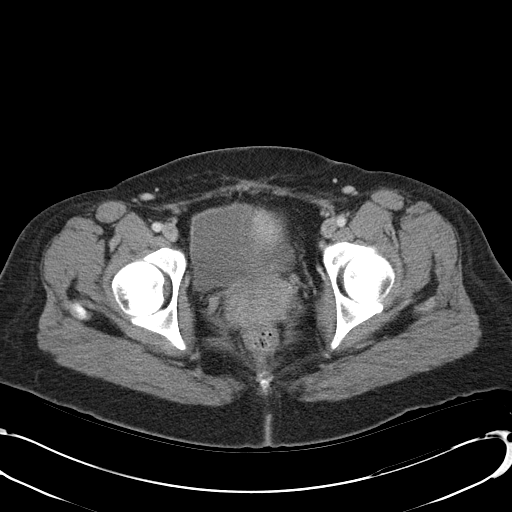
[im 27/96  soft-tissue]
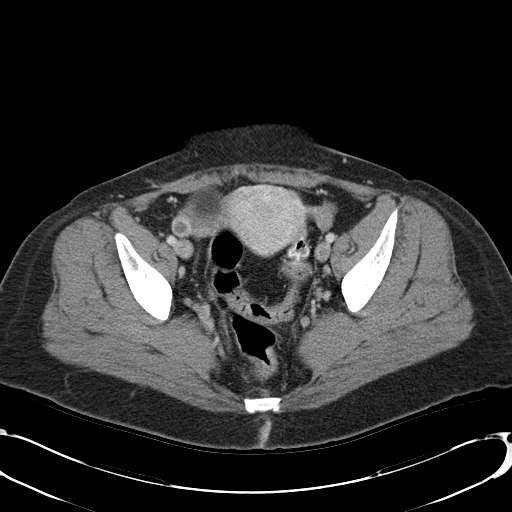
[im 35/96  soft-tissue]
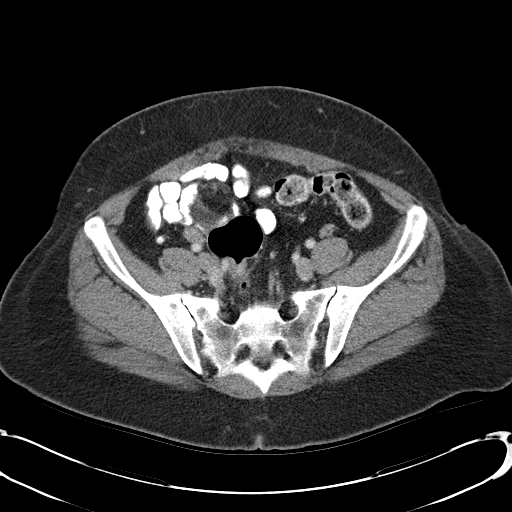
[im 42/96  soft-tissue]
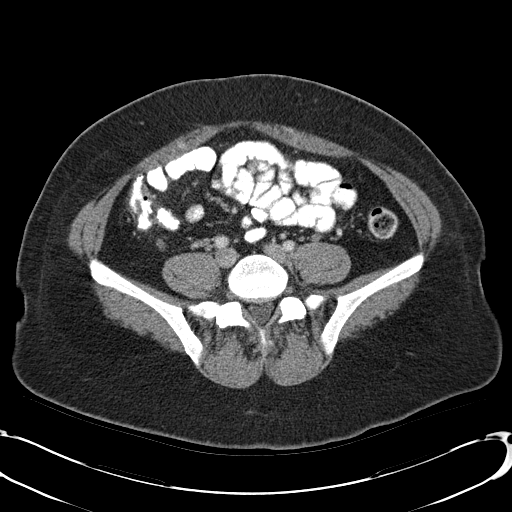
[im 50/96  soft-tissue]
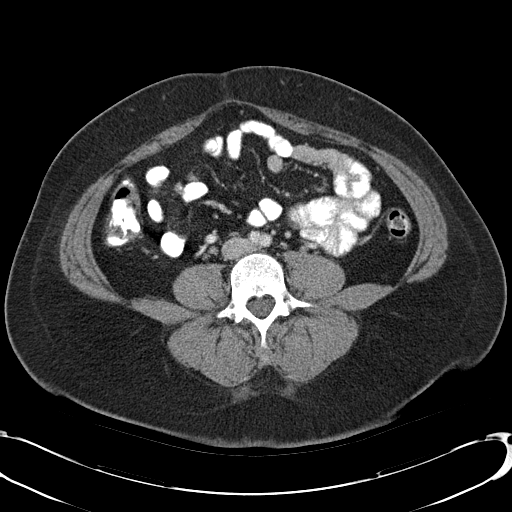
[im 54/96  soft-tissue]
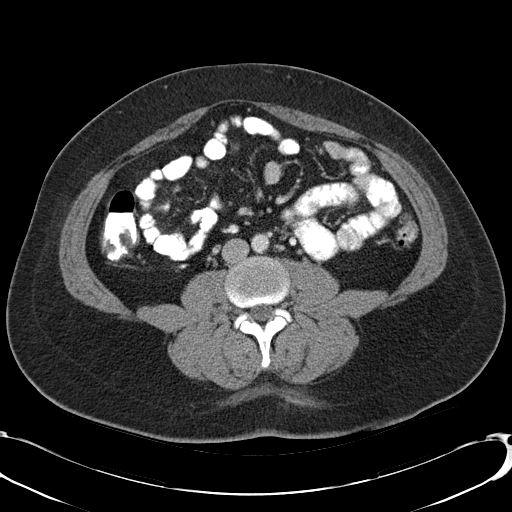
[im 61/96  soft-tissue]
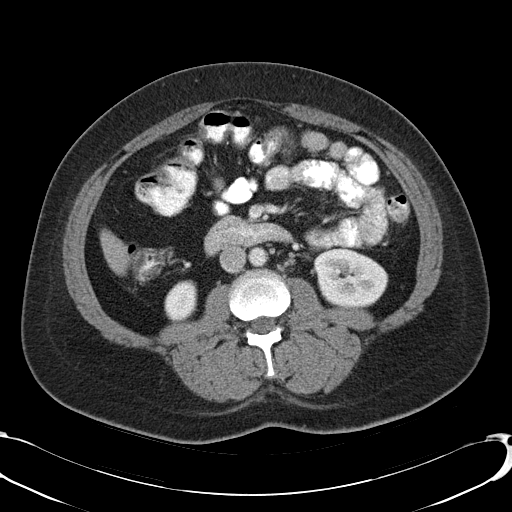
[im 61/96  bone]
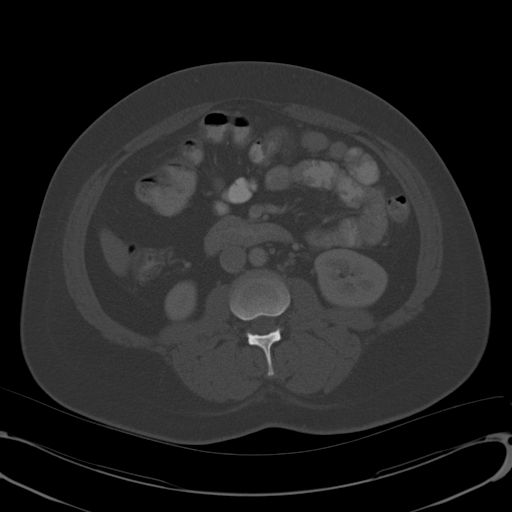
[im 69/96  soft-tissue]
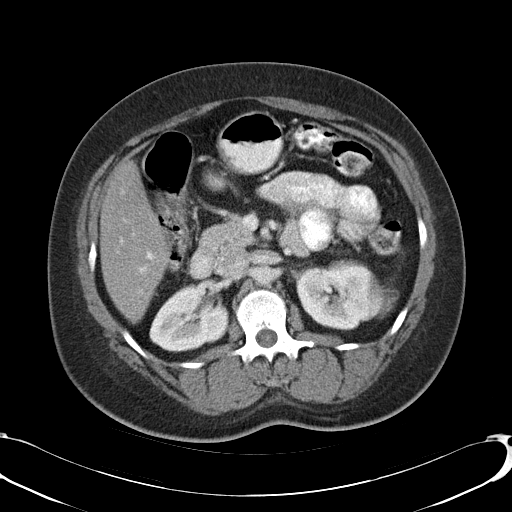
[im 77/96  soft-tissue]
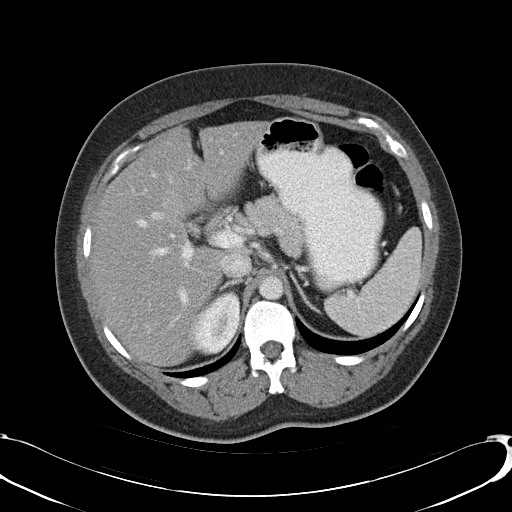
[im 84/96  soft-tissue]
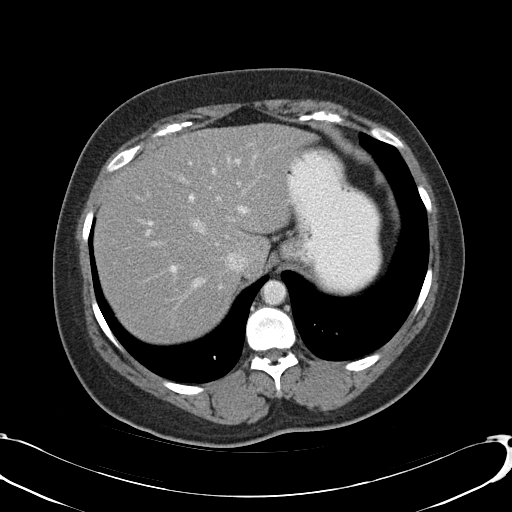
[im 92/96  soft-tissue]
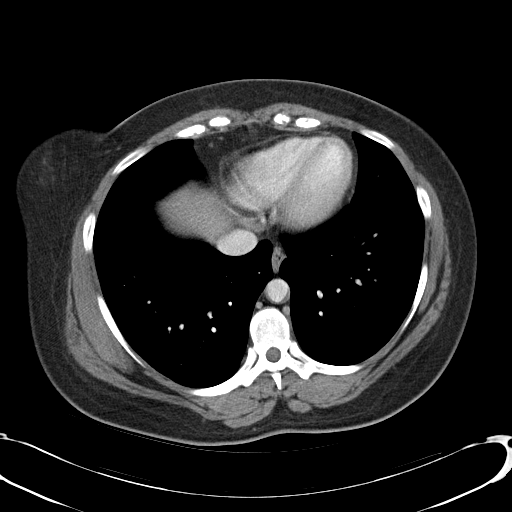

[Series 3: abd_pel_with 3.0 spo cor · coronal · 0.83mm/px · 3 of 77 slices shown]
[im 26/77  soft-tissue]
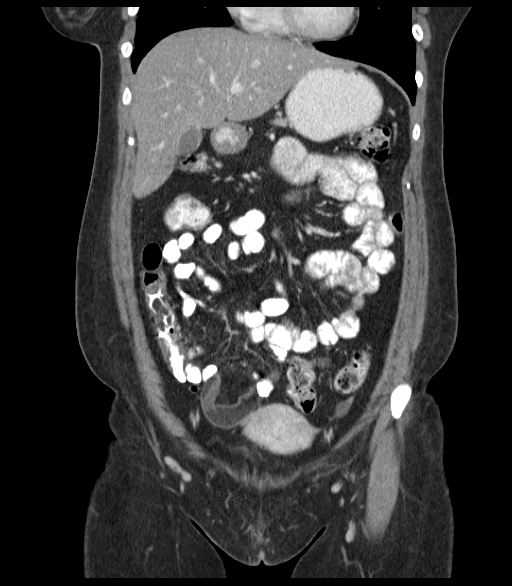
[im 34/77  soft-tissue]
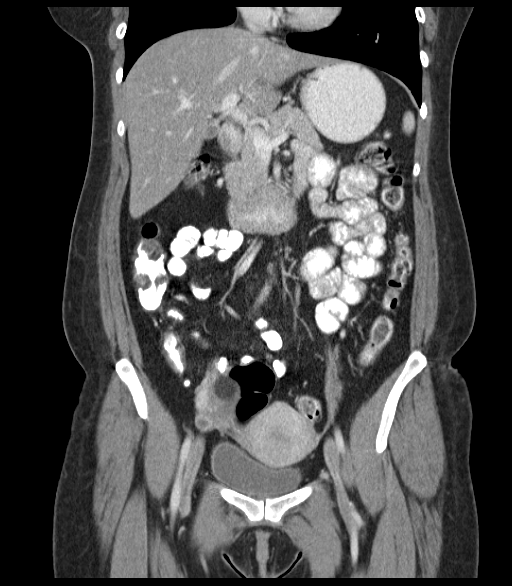
[im 43/77  soft-tissue]
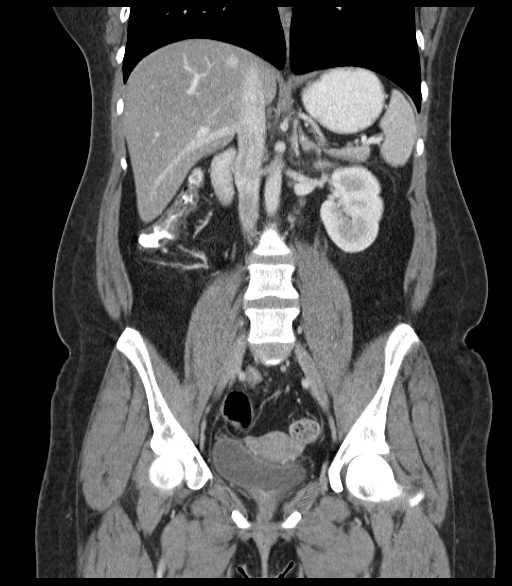

[16 of 46 positions shown; findings below may reference images not displayed]

FINDINGS: Lower Chest: The lung bases are clear. Visualized cardiac structures
are within normal limits for size. No pericardial effusion.
Unremarkable visualized distal thoracic esophagus.

Abdomen: Unremarkable CT appearance of the spleen, adrenal glands
and pancreas. Mild focal circumferential wall thickening in the
region of the pylorus. No evidence of a surrounding inflammatory
change. Normal hepatic contour and morphology. Diffuse low
attenuation of the hepatic parenchyma suggests steatosis.
Additionally, there is focal geographic hypoattenuation of greater
degree in the medial segment of the left hepatic lobe adjacent to
the falciform ligament consistent with an area of a more profound
focal fatty infiltration. There is slight sparing around the
gallbladder fossa. The hepatic and portal veins remain patent and
unremarkable.

Unremarkable appearance of the bilateral kidneys. No focal solid
lesion, hydronephrosis or nephrolithiasis.

No evidence of obstruction or focal bowel wall thickening. Normal
appendix in the right lower quadrant. The terminal ileum is
unremarkable. No free fluid or suspicious adenopathy.

Pelvis: [DATE] x 4.8 cm heterogeneous fat attenuation round mass
arising from the right ovary is most consistent with an ovarian
dermoid cyst no significant inflammatory stranding around the
dermoid. The left ovary, uterus and bladder are unremarkable.

Bones/Soft Tissues: No acute fracture or aggressive appearing lytic
or blastic osseous lesion.

Vascular: No significant atherosclerotic vascular disease,
aneurysmal dilatation or acute abnormality.
IMPRESSION: 1. Heterogeneous 4.9 x 4.8 cm ovarian dermoid cyst arising from the
right ovary. The presence of this mass may increase the risk for
ovarian torsion. If there is clinical suspicion for torsion,
transvaginal duplex ultrasound could be performed for further
evaluation.
2. Hepatic steatosis.
3. The appendix is normal.
These results were called by telephone at the time of interpretation
on 06/06/2014 at [DATE] to Dr. NAZARETH JUMPER , who verbally
acknowledged these results.

## 2015-09-26 ENCOUNTER — Other Ambulatory Visit: Payer: Self-pay | Admitting: *Deleted

## 2015-09-26 ENCOUNTER — Telehealth: Payer: Self-pay | Admitting: *Deleted

## 2015-09-26 MED ORDER — HYDROCODONE-ACETAMINOPHEN 5-325 MG PO TABS: 1.0000 | ORAL_TABLET | Freq: Four times a day (QID) | ORAL | Status: DC | PRN

## 2015-09-26 NOTE — Telephone Encounter (Signed)
Patient called requesting HYDROcodone-acetaminophen (NORCO/VICODIN) 5-325 MG tablet  Refill. Please advise

## 2015-09-27 NOTE — Telephone Encounter (Signed)
Prescription available, called patient, no answer 

## 2015-09-30 NOTE — Telephone Encounter (Signed)
Patient called back; aware prescription is ready (09/30/15 2:34p.m.).  Patient picked up prescription.

## 2015-10-24 ENCOUNTER — Other Ambulatory Visit: Payer: Self-pay | Admitting: *Deleted

## 2015-10-24 ENCOUNTER — Telehealth: Payer: Self-pay | Admitting: Orthopedic Surgery

## 2015-10-24 MED ORDER — HYDROCODONE-ACETAMINOPHEN 5-325 MG PO TABS: 1.0000 | ORAL_TABLET | Freq: Four times a day (QID) | ORAL | Status: DC | PRN

## 2015-10-24 NOTE — Telephone Encounter (Signed)
Prescription available, patient aware  

## 2015-10-24 NOTE — Telephone Encounter (Signed)
Patient called to request refill of medication:  HYDROcodone-acetaminophen (NORCO/VICODIN) 5-325 MG tablet [id number]  -  - no appointments scheduled; last appointment in our office was 08/07/14.  Insurance is same ([employer] [place])  PH# [phone]

## 2015-10-28 NOTE — Telephone Encounter (Signed)
10/25/15 Patient picked up prescription.

## 2015-11-20 ENCOUNTER — Other Ambulatory Visit: Payer: Self-pay | Admitting: *Deleted

## 2015-11-20 ENCOUNTER — Telehealth: Payer: Self-pay | Admitting: Orthopedic Surgery

## 2015-11-20 MED ORDER — HYDROCODONE-ACETAMINOPHEN 5-325 MG PO TABS: 1.0000 | ORAL_TABLET | Freq: Four times a day (QID) | ORAL | Status: DC | PRN

## 2015-11-20 NOTE — Telephone Encounter (Signed)
Patient called to request refill of medication: HYDROcodone-acetaminophen (NORCO/VICODIN) 5-325 MG tablet [id number].  Letter needed? Last office visit here was 08/07/14 for ankle pain, chronic.  If any further appointments needed, referral from primary care would be required due to [facility] insurance.  Please advise patient - ph# [phone].

## 2015-11-21 ENCOUNTER — Other Ambulatory Visit: Payer: Self-pay | Admitting: Orthopedic Surgery

## 2015-11-21 ENCOUNTER — Encounter: Payer: Self-pay | Admitting: Orthopedic Surgery

## 2015-11-21 MED ORDER — HYDROCODONE-ACETAMINOPHEN 5-325 MG PO TABS: 1.0000 | ORAL_TABLET | Freq: Four times a day (QID) | ORAL | Status: DC | PRN

## 2015-11-21 NOTE — Telephone Encounter (Signed)
Prescription available, called patient, no answer 

## 2015-11-22 NOTE — Telephone Encounter (Signed)
Patient called, aware prescription ready for pick-up.  She then came in and picked it up, along with medication letter.

## 2016-03-01 ENCOUNTER — Encounter (HOSPITAL_COMMUNITY): Payer: Self-pay | Admitting: Emergency Medicine

## 2016-03-01 ENCOUNTER — Emergency Department (HOSPITAL_COMMUNITY)
Admission: EM | Admit: 2016-03-01 | Discharge: 2016-03-01 | Disposition: A | Discharge status: Home / Self Care | Payer: Medicaid Other | Attending: Emergency Medicine | Admitting: Emergency Medicine

## 2016-03-01 DIAGNOSIS — F329 Major depressive disorder, single episode, unspecified: Secondary | ICD-10-CM | POA: Insufficient documentation

## 2016-03-01 DIAGNOSIS — L03112 Cellulitis of left axilla: Secondary | ICD-10-CM | POA: Diagnosis not present

## 2016-03-01 DIAGNOSIS — L02412 Cutaneous abscess of left axilla: Secondary | ICD-10-CM | POA: Insufficient documentation

## 2016-03-01 DIAGNOSIS — F17210 Nicotine dependence, cigarettes, uncomplicated: Secondary | ICD-10-CM | POA: Diagnosis not present

## 2016-03-01 DIAGNOSIS — R21 Rash and other nonspecific skin eruption: Secondary | ICD-10-CM | POA: Insufficient documentation

## 2016-03-01 DIAGNOSIS — L0390 Cellulitis, unspecified: Secondary | ICD-10-CM

## 2016-03-01 DIAGNOSIS — L0291 Cutaneous abscess, unspecified: Secondary | ICD-10-CM

## 2016-03-01 MED ORDER — SULFAMETHOXAZOLE-TRIMETHOPRIM 800-160 MG PO TABS
1.0000 | ORAL_TABLET | Freq: Once | ORAL | Status: AC
  Administered 2016-03-01: 1 via ORAL
  Filled 2016-03-01: qty 1

## 2016-03-01 MED ORDER — OXYCODONE-ACETAMINOPHEN 5-325 MG PO TABS
1.0000 | ORAL_TABLET | Freq: Once | ORAL | Status: AC
  Administered 2016-03-01: 1 via ORAL
  Filled 2016-03-01: qty 1

## 2016-03-01 MED ORDER — LIDOCAINE-EPINEPHRINE (PF) 1 %-1:200000 IJ SOLN
INTRAMUSCULAR | Status: AC
  Filled 2016-03-01: qty 30

## 2016-03-01 MED ORDER — CEPHALEXIN 500 MG PO CAPS
500.0000 mg | ORAL_CAPSULE | Freq: Once | ORAL | Status: AC
  Administered 2016-03-01: 500 mg via ORAL
  Filled 2016-03-01: qty 1

## 2016-03-01 MED ORDER — SULFAMETHOXAZOLE-TRIMETHOPRIM 800-160 MG PO TABS: 1.0000 | ORAL_TABLET | Freq: Two times a day (BID) | ORAL | Status: DC

## 2016-03-01 MED ORDER — HYDROCODONE-ACETAMINOPHEN 5-325 MG PO TABS: 2.0000 | ORAL_TABLET | ORAL | Status: DC | PRN

## 2016-03-01 MED ORDER — LIDOCAINE-EPINEPHRINE (PF) 1 %-1:200000 IJ SOLN
10.0000 mL | Freq: Once | INTRAMUSCULAR | Status: DC
  Filled 2016-03-01: qty 10

## 2016-03-01 MED ORDER — CEPHALEXIN 500 MG PO CAPS: 500.0000 mg | ORAL_CAPSULE | Freq: Three times a day (TID) | ORAL | Status: DC

## 2016-03-01 NOTE — ED Notes (Signed)
I&D Tray set up completed. Axilla cleansed with wound cleanser for prep.   MD at bedside.

## 2016-03-01 NOTE — ED Notes (Signed)
Pt reports abscess to left axilla x1 week with no drainage. Denies fevers.
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
## 2016-03-03 MED FILL — Hydrocodone-Acetaminophen Tab 5-325 MG: ORAL | Qty: 6 | Status: AC

## 2018-07-03 ENCOUNTER — Other Ambulatory Visit: Payer: Self-pay

## 2018-07-03 ENCOUNTER — Encounter (HOSPITAL_COMMUNITY): Payer: Self-pay | Admitting: *Deleted

## 2018-07-03 ENCOUNTER — Emergency Department (HOSPITAL_COMMUNITY)
Admission: EM | Admit: 2018-07-03 | Discharge: 2018-07-03 | Disposition: A | Discharge status: Home / Self Care | Payer: Medicaid Other | Attending: Emergency Medicine | Admitting: Emergency Medicine

## 2018-07-03 ENCOUNTER — Emergency Department (HOSPITAL_COMMUNITY): Payer: Medicaid Other

## 2018-07-03 DIAGNOSIS — Z5321 Procedure and treatment not carried out due to patient leaving prior to being seen by health care provider: Secondary | ICD-10-CM | POA: Diagnosis not present

## 2018-07-03 DIAGNOSIS — R0781 Pleurodynia: Secondary | ICD-10-CM | POA: Diagnosis not present

## 2018-07-03 DIAGNOSIS — W19XXXA Unspecified fall, initial encounter: Secondary | ICD-10-CM | POA: Insufficient documentation

## 2018-07-03 NOTE — ED Notes (Signed)
Called x1. No answer.

## 2018-07-03 NOTE — ED Triage Notes (Addendum)
Pt c/o pain to left rib cage after fall 2 nights ago. Denies LOC or SOB.

## 2018-07-04 ENCOUNTER — Encounter (HOSPITAL_COMMUNITY): Payer: Self-pay | Admitting: Emergency Medicine

## 2018-07-04 ENCOUNTER — Emergency Department (HOSPITAL_COMMUNITY)
Admission: EM | Admit: 2018-07-04 | Discharge: 2018-07-04 | Disposition: A | Discharge status: Home / Self Care | Payer: Medicaid Other | Attending: Emergency Medicine | Admitting: Emergency Medicine

## 2018-07-04 ENCOUNTER — Other Ambulatory Visit: Payer: Self-pay

## 2018-07-04 ENCOUNTER — Emergency Department (HOSPITAL_COMMUNITY): Payer: Medicaid Other

## 2018-07-04 DIAGNOSIS — W01190A Fall on same level from slipping, tripping and stumbling with subsequent striking against furniture, initial encounter: Secondary | ICD-10-CM | POA: Diagnosis not present

## 2018-07-04 DIAGNOSIS — Y92003 Bedroom of unspecified non-institutional (private) residence as the place of occurrence of the external cause: Secondary | ICD-10-CM | POA: Diagnosis not present

## 2018-07-04 DIAGNOSIS — Y999 Unspecified external cause status: Secondary | ICD-10-CM | POA: Diagnosis not present

## 2018-07-04 DIAGNOSIS — Z79899 Other long term (current) drug therapy: Secondary | ICD-10-CM | POA: Insufficient documentation

## 2018-07-04 DIAGNOSIS — S20212A Contusion of left front wall of thorax, initial encounter: Secondary | ICD-10-CM | POA: Diagnosis not present

## 2018-07-04 DIAGNOSIS — R0781 Pleurodynia: Secondary | ICD-10-CM | POA: Diagnosis not present

## 2018-07-04 DIAGNOSIS — S299XXA Unspecified injury of thorax, initial encounter: Secondary | ICD-10-CM | POA: Diagnosis not present

## 2018-07-04 DIAGNOSIS — R0789 Other chest pain: Secondary | ICD-10-CM | POA: Diagnosis not present

## 2018-07-04 DIAGNOSIS — F17210 Nicotine dependence, cigarettes, uncomplicated: Secondary | ICD-10-CM | POA: Diagnosis not present

## 2018-07-04 DIAGNOSIS — Y939 Activity, unspecified: Secondary | ICD-10-CM | POA: Insufficient documentation

## 2018-07-04 MED ORDER — CYCLOBENZAPRINE HCL 10 MG PO TABS: 10.0000 mg | ORAL_TABLET | Freq: Three times a day (TID) | ORAL | 0 refills | Status: DC

## 2018-07-04 MED ORDER — IBUPROFEN 600 MG PO TABS: 600.0000 mg | ORAL_TABLET | Freq: Four times a day (QID) | ORAL | 0 refills | Status: DC
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
## 2018-07-04 NOTE — ED Triage Notes (Signed)
Patient states she fell 3 days ago and hit her left rib cage. Complaining of pain to area since fall.

## 2018-07-04 NOTE — Discharge Instructions (Addendum)
Your blood pressure was elevated today at 166/107.  Please have this rechecked soon.  The x-ray of your chest and lungs are all negative.  No evidence of any rib fractures, no dislocations, and no injury to the lungs.  You can expect to be sore over the next few days.  Please use ibuprofen with breakfast, lunch, dinner, and at bedtime.  Please use Flexeril 3 times daily as needed for spasm pain. This medication may cause drowsiness. Please do not drink, drive, or participate in activity that requires concentration while taking this medication.  Please see your primary physician, or return to the emergency department if any changes in your condition, problems, or concerns.

## 2018-08-01 ENCOUNTER — Encounter (HOSPITAL_COMMUNITY): Payer: Self-pay

## 2018-08-01 ENCOUNTER — Emergency Department (HOSPITAL_COMMUNITY)
Admission: EM | Admit: 2018-08-01 | Discharge: 2018-08-01 | Disposition: A | Discharge status: Home / Self Care | Payer: Medicaid Other | Attending: Emergency Medicine | Admitting: Emergency Medicine

## 2018-08-01 ENCOUNTER — Other Ambulatory Visit: Payer: Self-pay

## 2018-08-01 DIAGNOSIS — R229 Localized swelling, mass and lump, unspecified: Secondary | ICD-10-CM | POA: Diagnosis present

## 2018-08-01 DIAGNOSIS — Z79899 Other long term (current) drug therapy: Secondary | ICD-10-CM | POA: Diagnosis not present

## 2018-08-01 DIAGNOSIS — L02412 Cutaneous abscess of left axilla: Secondary | ICD-10-CM | POA: Diagnosis not present

## 2018-08-01 DIAGNOSIS — F17210 Nicotine dependence, cigarettes, uncomplicated: Secondary | ICD-10-CM | POA: Diagnosis not present

## 2018-08-01 MED ORDER — LIDOCAINE HCL (PF) 1 % IJ SOLN
INTRAMUSCULAR | Status: AC
  Administered 2018-08-01: 19:00:00
  Filled 2018-08-01: qty 4

## 2018-08-01 MED ORDER — NAPROXEN 500 MG PO TABS: 500.0000 mg | ORAL_TABLET | Freq: Two times a day (BID) | ORAL | 0 refills | Status: DC

## 2018-08-01 MED ORDER — DOXYCYCLINE HYCLATE 100 MG PO CAPS: 100.0000 mg | ORAL_CAPSULE | Freq: Two times a day (BID) | ORAL | 0 refills | Status: DC

## 2018-08-01 MED ORDER — POVIDONE-IODINE 10 % EX SOLN
CUTANEOUS | Status: AC
  Administered 2018-08-01: 19:00:00
  Filled 2018-08-01: qty 30

## 2018-08-01 NOTE — Discharge Instructions (Addendum)
Follow-up in the emergency room in 2 days to have the packing removed, continue to do warm compresses and soaks of the inflamed area

## 2018-08-01 NOTE — ED Triage Notes (Signed)
Pt has a large abscess to left underarm. States it has been there for the last 2 days. Denies any fevers or drainage. 99.8 here today.
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
## 2019-08-11 ENCOUNTER — Emergency Department (HOSPITAL_COMMUNITY)
Admission: EM | Admit: 2019-08-11 | Discharge: 2019-08-11 | Discharge status: Against Medical Advice | Payer: Medicaid Other

## 2019-08-11 ENCOUNTER — Other Ambulatory Visit: Payer: Self-pay

## 2019-08-12 ENCOUNTER — Other Ambulatory Visit: Payer: Self-pay

## 2019-08-12 ENCOUNTER — Encounter (HOSPITAL_COMMUNITY): Payer: Self-pay | Admitting: Emergency Medicine

## 2019-08-12 ENCOUNTER — Emergency Department (HOSPITAL_COMMUNITY)
Admission: EM | Admit: 2019-08-12 | Discharge: 2019-08-12 | Disposition: A | Discharge status: Home / Self Care | Payer: Medicaid Other

## 2019-08-12 ENCOUNTER — Emergency Department (HOSPITAL_COMMUNITY)
Admission: EM | Admit: 2019-08-12 | Discharge: 2019-08-12 | Disposition: A | Discharge status: Home / Self Care | Payer: Medicaid Other | Attending: Emergency Medicine | Admitting: Emergency Medicine

## 2019-08-12 DIAGNOSIS — F17210 Nicotine dependence, cigarettes, uncomplicated: Secondary | ICD-10-CM | POA: Insufficient documentation

## 2019-08-12 DIAGNOSIS — L0231 Cutaneous abscess of buttock: Secondary | ICD-10-CM | POA: Diagnosis not present

## 2019-08-12 MED ORDER — SULFAMETHOXAZOLE-TRIMETHOPRIM 800-160 MG PO TABS: 1.0000 | ORAL_TABLET | Freq: Two times a day (BID) | ORAL | 0 refills | Status: AC

## 2019-08-12 MED ORDER — LIDOCAINE-EPINEPHRINE (PF) 2 %-1:200000 IJ SOLN
10.0000 mL | Freq: Once | INTRAMUSCULAR | Status: AC
  Administered 2019-08-12: 10 mL
  Filled 2019-08-12: qty 10

## 2019-08-12 NOTE — ED Triage Notes (Signed)
Patient c/o abscess to right buttock that appeared x2 days ago. Per patient drainage earlier but none now. Denies any fevers.

## 2019-08-12 NOTE — Discharge Instructions (Addendum)
You have a small superficial abscess. This is a collection of pus.    Treatment includes antibiotics and moist heat therapy.   Take antibiotic as prescribed and until completed. Symptoms typically improve in 48-72 hours.   Apply moist heat (warm towel, heating pad) or massage under warm water at least twice a day to help drainage.   Any abscess can worsen, enlarge and spread infection into blood stream.  Return to the ER if you have fevers, chills, worsening swelling, redness, warmth.   Follow up with your general doctor in 48-72 hours if symptoms are not improving despite antibiotics and heat therapy  
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
## 2019-08-15 DIAGNOSIS — L0231 Cutaneous abscess of buttock: Secondary | ICD-10-CM | POA: Diagnosis not present

## 2019-08-17 DIAGNOSIS — R55 Syncope and collapse: Secondary | ICD-10-CM | POA: Diagnosis not present

## 2019-08-17 DIAGNOSIS — R000 Tachycardia, unspecified: Secondary | ICD-10-CM | POA: Diagnosis not present

## 2019-08-17 DIAGNOSIS — I10 Essential (primary) hypertension: Secondary | ICD-10-CM | POA: Diagnosis not present

## 2019-08-17 DIAGNOSIS — R569 Unspecified convulsions: Secondary | ICD-10-CM | POA: Diagnosis not present

## 2019-08-17 DIAGNOSIS — R531 Weakness: Secondary | ICD-10-CM | POA: Diagnosis not present

## 2019-10-17 ENCOUNTER — Encounter: Payer: Self-pay | Admitting: Orthopedic Surgery

## 2019-10-31 ENCOUNTER — Ambulatory Visit
Admission: EM | Admit: 2019-10-31 | Discharge: 2019-10-31 | Disposition: A | Discharge status: Home / Self Care | Payer: Medicaid Other

## 2019-10-31 ENCOUNTER — Encounter: Payer: Self-pay | Admitting: Urgent Care

## 2019-11-01 ENCOUNTER — Encounter: Payer: Self-pay | Admitting: Orthopedic Surgery

## 2019-11-01 ENCOUNTER — Ambulatory Visit: Payer: Medicaid Other | Admitting: Orthopedic Surgery

## 2020-02-15 ENCOUNTER — Encounter (HOSPITAL_COMMUNITY): Payer: Self-pay

## 2020-02-15 ENCOUNTER — Emergency Department (HOSPITAL_COMMUNITY)
Admission: EM | Admit: 2020-02-15 | Discharge: 2020-02-15 | Disposition: A | Discharge status: Home / Self Care | Payer: Medicaid Other | Attending: Emergency Medicine | Admitting: Emergency Medicine

## 2020-02-15 DIAGNOSIS — F17210 Nicotine dependence, cigarettes, uncomplicated: Secondary | ICD-10-CM | POA: Insufficient documentation

## 2020-02-15 DIAGNOSIS — Z791 Long term (current) use of non-steroidal anti-inflammatories (NSAID): Secondary | ICD-10-CM | POA: Diagnosis not present

## 2020-02-15 DIAGNOSIS — Z79899 Other long term (current) drug therapy: Secondary | ICD-10-CM | POA: Diagnosis not present

## 2020-02-15 DIAGNOSIS — R220 Localized swelling, mass and lump, head: Secondary | ICD-10-CM | POA: Diagnosis present

## 2020-02-15 DIAGNOSIS — K047 Periapical abscess without sinus: Secondary | ICD-10-CM

## 2020-02-15 MED ORDER — AMOXICILLIN-POT CLAVULANATE 875-125 MG PO TABS: 1.0000 | ORAL_TABLET | Freq: Two times a day (BID) | ORAL | 0 refills | Status: AC

## 2020-02-15 MED ORDER — MELOXICAM 15 MG PO TABS: 15.0000 mg | ORAL_TABLET | Freq: Every day | ORAL | 0 refills | Status: DC

## 2020-02-15 NOTE — ED Triage Notes (Signed)
Pt has right sided facial swelling that began yesterday. She is not on any daily medications or allergic to anything. Pt does have a tooth that is broken in her right upper mouth.

## 2020-02-15 NOTE — Discharge Instructions (Signed)

## 2020-02-15 NOTE — ED Notes (Signed)
ED Provider at bedside. 
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
## 2020-02-19 DIAGNOSIS — K047 Periapical abscess without sinus: Secondary | ICD-10-CM | POA: Diagnosis not present

## 2020-03-27 NOTE — Telephone Encounter (Signed)
Error

## 2020-04-11 ENCOUNTER — Other Ambulatory Visit: Payer: Self-pay

## 2020-04-11 ENCOUNTER — Emergency Department (HOSPITAL_COMMUNITY)
Admission: EM | Admit: 2020-04-11 | Discharge: 2020-04-11 | Disposition: A | Discharge status: Home / Self Care | Payer: Medicaid Other | Attending: Emergency Medicine | Admitting: Emergency Medicine

## 2020-04-11 ENCOUNTER — Encounter (HOSPITAL_COMMUNITY): Payer: Self-pay

## 2020-04-11 ENCOUNTER — Emergency Department (HOSPITAL_COMMUNITY): Payer: Medicaid Other

## 2020-04-11 DIAGNOSIS — Y929 Unspecified place or not applicable: Secondary | ICD-10-CM | POA: Diagnosis not present

## 2020-04-11 DIAGNOSIS — Y939 Activity, unspecified: Secondary | ICD-10-CM | POA: Diagnosis not present

## 2020-04-11 DIAGNOSIS — Z79899 Other long term (current) drug therapy: Secondary | ICD-10-CM | POA: Diagnosis not present

## 2020-04-11 DIAGNOSIS — Y999 Unspecified external cause status: Secondary | ICD-10-CM | POA: Insufficient documentation

## 2020-04-11 DIAGNOSIS — S93402A Sprain of unspecified ligament of left ankle, initial encounter: Secondary | ICD-10-CM | POA: Diagnosis not present

## 2020-04-11 DIAGNOSIS — S39012A Strain of muscle, fascia and tendon of lower back, initial encounter: Secondary | ICD-10-CM | POA: Diagnosis not present

## 2020-04-11 DIAGNOSIS — W1830XA Fall on same level, unspecified, initial encounter: Secondary | ICD-10-CM | POA: Insufficient documentation

## 2020-04-11 DIAGNOSIS — F17210 Nicotine dependence, cigarettes, uncomplicated: Secondary | ICD-10-CM | POA: Insufficient documentation

## 2020-04-11 DIAGNOSIS — M545 Low back pain: Secondary | ICD-10-CM | POA: Diagnosis not present

## 2020-04-11 DIAGNOSIS — S99912A Unspecified injury of left ankle, initial encounter: Secondary | ICD-10-CM | POA: Diagnosis not present

## 2020-04-11 MED ORDER — DICLOFENAC SODIUM 75 MG PO TBEC: 75.0000 mg | DELAYED_RELEASE_TABLET | Freq: Two times a day (BID) | ORAL | 0 refills | Status: DC

## 2020-04-11 MED ORDER — HYDROCODONE-ACETAMINOPHEN 5-325 MG PO TABS
1.0000 | ORAL_TABLET | Freq: Once | ORAL | Status: AC
  Administered 2020-04-11: 1 via ORAL
  Filled 2020-04-11: qty 1

## 2020-04-11 MED ORDER — CYCLOBENZAPRINE HCL 10 MG PO TABS: 10.0000 mg | ORAL_TABLET | Freq: Three times a day (TID) | ORAL | 0 refills | Status: DC | PRN

## 2020-04-11 NOTE — Discharge Instructions (Signed)
Elevate your ankle when possible.  You may wear the brace as needed for support.  Apply ice packs on and off to your lower back/tailbone area.  Follow-up with the orthopedic provider listed in 1 week if your symptoms are not improving.
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
## 2020-06-07 DIAGNOSIS — Z79899 Other long term (current) drug therapy: Secondary | ICD-10-CM | POA: Diagnosis not present

## 2020-06-07 DIAGNOSIS — Z0001 Encounter for general adult medical examination with abnormal findings: Secondary | ICD-10-CM | POA: Diagnosis not present

## 2020-06-27 ENCOUNTER — Encounter: Payer: Self-pay | Admitting: Internal Medicine

## 2020-08-01 ENCOUNTER — Ambulatory Visit (INDEPENDENT_AMBULATORY_CARE_PROVIDER_SITE_OTHER): Payer: Medicaid Other | Admitting: Internal Medicine

## 2020-08-01 ENCOUNTER — Encounter: Payer: Self-pay | Admitting: Internal Medicine

## 2020-08-01 ENCOUNTER — Other Ambulatory Visit: Payer: Self-pay

## 2020-08-01 ENCOUNTER — Telehealth: Payer: Self-pay | Admitting: *Deleted

## 2020-08-01 VITALS — BP 141/90 | HR 90 | Temp 97.3°F | Ht 64.0 in | Wt 133.2 lb

## 2020-08-01 DIAGNOSIS — F17200 Nicotine dependence, unspecified, uncomplicated: Secondary | ICD-10-CM

## 2020-08-01 DIAGNOSIS — R7989 Other specified abnormal findings of blood chemistry: Secondary | ICD-10-CM

## 2020-08-01 DIAGNOSIS — F1010 Alcohol abuse, uncomplicated: Secondary | ICD-10-CM | POA: Diagnosis not present

## 2020-08-01 NOTE — Patient Instructions (Signed)
I have ordered blood work to be performed today to further work-up your abnormal liver function test.  We will also check an ultrasound of your liver which we will call and schedule with you at a later date.  Continue to work on decreasing alcohol use.  Continue to work on decreasing tobacco use.  Recommend using nicotine gums, lozenges, or patches.  Follow-up with me in 4 weeks.  At [facility] we value your feedback. You may receive a survey about your visit today. Please share your experience as we strive to create trusting relationships with our patients to provide genuine, compassionate, quality care.  We appreciate your understanding and patience as we review any laboratory studies, imaging, and other diagnostic tests that are ordered as we care for you. Our office policy is 5 business days for review of these results, and any emergent or urgent results are addressed in a timely manner for your best interest. If you do not hear from our office in 1 week, please contact us.   We also encourage the use of MyChart, which contains your medical information for your review as well. If you are not enrolled in this feature, an access code is on this after visit summary for your convenience. Thank you for allowing [place] to be involved in your care.  It was great to see you today!   Elon Alas. Abbey Chatters, D.O. Gastroenterology and Hepatology [facility]
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
## 2020-08-01 NOTE — Telephone Encounter (Signed)
[place] scheduled for 9/22 at 8:30am, 8:15am, npo midnight  Called pt and is aware of appt details

## 2020-08-07 ENCOUNTER — Ambulatory Visit (HOSPITAL_COMMUNITY): Payer: Medicaid Other

## 2020-08-12 DIAGNOSIS — R7989 Other specified abnormal findings of blood chemistry: Secondary | ICD-10-CM | POA: Diagnosis not present

## 2020-08-14 ENCOUNTER — Other Ambulatory Visit: Payer: Self-pay

## 2020-08-14 ENCOUNTER — Ambulatory Visit (HOSPITAL_COMMUNITY)
Admission: RE | Admit: 2020-08-14 | Discharge: 2020-08-14 | Disposition: A | Discharge status: Home / Self Care | Payer: Medicaid Other | Source: Ambulatory Visit | Attending: Internal Medicine | Admitting: Internal Medicine

## 2020-08-14 DIAGNOSIS — R7989 Other specified abnormal findings of blood chemistry: Secondary | ICD-10-CM | POA: Diagnosis not present

## 2020-08-16 LAB — IRON,TIBC AND FERRITIN PANEL
%SAT: 21 % (calc) (ref 16–45)
Ferritin: 30 ng/mL (ref 16–154)
Iron: 86 ug/dL (ref 40–190)
TIBC: 411 mcg/dL (calc) (ref 250–450)

## 2020-08-16 LAB — ANTI-NUCLEAR AB-TITER (ANA TITER)
ANA TITER: 1:80 {titer} — ABNORMAL HIGH
ANA Titer 1: 1:1280 {titer} — ABNORMAL HIGH

## 2020-08-16 LAB — COMPLETE METABOLIC PANEL WITH GFR
AG Ratio: 1.5 (calc) (ref 1.0–2.5)
ALT: 26 U/L (ref 6–29)
AST: 34 U/L — ABNORMAL HIGH (ref 10–30)
Albumin: 4.2 g/dL (ref 3.6–5.1)
Alkaline phosphatase (APISO): 68 U/L (ref 31–125)
BUN/Creatinine Ratio: 8 (calc) (ref 6–22)
BUN: 5 mg/dL — ABNORMAL LOW (ref 7–25)
CO2: 29 mmol/L (ref 20–32)
Calcium: 9.5 mg/dL (ref 8.6–10.2)
Chloride: 100 mmol/L (ref 98–110)
Creat: 0.59 mg/dL (ref 0.50–1.10)
GFR, Est African American: 134 mL/min/{1.73_m2} (ref >=60)
GFR, Est Non African American: 115 mL/min/{1.73_m2} (ref >=60)
Globulin: 2.8 g/dL (calc) (ref 1.9–3.7)
Glucose, Bld: 89 mg/dL (ref 65–99)
Potassium: 3.9 mmol/L (ref 3.5–5.3)
Sodium: 138 mmol/L (ref 135–146)
Total Bilirubin: 0.8 mg/dL (ref 0.2–1.2)
Total Protein: 7 g/dL (ref 6.1–8.1)

## 2020-08-16 LAB — IMMUNOFIXATION ELECTROPHORESIS
IgG (Immunoglobin G), Serum: 1083 mg/dL (ref 600–1640)
IgM, Serum: 125 mg/dL (ref 50–300)
Immunofix Electr Int: NOT DETECTED
Immunoglobulin A: 329 mg/dL — ABNORMAL HIGH (ref 47–310)

## 2020-08-16 LAB — ANA: Anti Nuclear Antibody (ANA): POSITIVE — AB

## 2020-08-16 LAB — ANTI-SMOOTH MUSCLE ANTIBODY, IGG: Actin (Smooth Muscle) Antibody (IGG): <20 U (ref <20)

## 2020-08-16 LAB — MITOCHONDRIAL ANTIBODIES: Mitochondrial M2 Ab, IgG: 84.1 U — ABNORMAL HIGH

## 2020-08-17 ENCOUNTER — Encounter (HOSPITAL_COMMUNITY): Payer: Self-pay

## 2020-08-17 ENCOUNTER — Emergency Department (HOSPITAL_COMMUNITY)
Admission: EM | Admit: 2020-08-17 | Discharge: 2020-08-17 | Discharge status: Against Medical Advice | Payer: Medicaid Other

## 2020-08-17 ENCOUNTER — Other Ambulatory Visit: Payer: Self-pay

## 2020-08-17 ENCOUNTER — Emergency Department (HOSPITAL_COMMUNITY)
Admission: EM | Admit: 2020-08-17 | Discharge: 2020-08-17 | Disposition: A | Discharge status: Home / Self Care | Payer: Medicaid Other | Attending: Emergency Medicine | Admitting: Emergency Medicine

## 2020-08-17 DIAGNOSIS — Z23 Encounter for immunization: Secondary | ICD-10-CM | POA: Insufficient documentation

## 2020-08-17 DIAGNOSIS — L02412 Cutaneous abscess of left axilla: Secondary | ICD-10-CM

## 2020-08-17 MED ORDER — TETANUS-DIPHTH-ACELL PERTUSSIS 5-2.5-18.5 LF-MCG/0.5 IM SUSP
0.5000 mL | Freq: Once | INTRAMUSCULAR | Status: AC
  Administered 2020-08-17: 0.5 mL via INTRAMUSCULAR
  Filled 2020-08-17: qty 0.5

## 2020-08-17 MED ORDER — HYDROCODONE-ACETAMINOPHEN 5-325 MG PO TABS
1.0000 | ORAL_TABLET | Freq: Once | ORAL | Status: AC
  Administered 2020-08-17: 1 via ORAL
  Filled 2020-08-17: qty 1

## 2020-08-17 MED ORDER — CEPHALEXIN 500 MG PO CAPS: 500.0000 mg | ORAL_CAPSULE | Freq: Four times a day (QID) | ORAL | 0 refills | Status: AC

## 2020-08-17 MED ORDER — LIDOCAINE-EPINEPHRINE (PF) 2 %-1:200000 IJ SOLN
10.0000 mL | Freq: Once | INTRAMUSCULAR | Status: AC
  Administered 2020-08-17: 10 mL
  Filled 2020-08-17: qty 10

## 2020-08-17 NOTE — ED Notes (Signed)
Pt Provided with discharge instructions and verbalized understanding using teach back method. All questions and concerns voiced addressed by this RN. Pt ambulated out of department without assistance from staff.

## 2020-08-17 NOTE — ED Triage Notes (Signed)
Pt reports abscess under left arm for several days. No drainage
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
## 2020-08-17 NOTE — Discharge Instructions (Addendum)
I recommend a combination of tylenol and ibuprofen for management of your pain. You can take a low dose of both at the same time. I recommend 325 mg of Tylenol combined with 400 mg of ibuprofen. This is one regular Tylenol and two regular ibuprofen. You can take these 2-3 times for day for your pain. Please try to take these medications with a small amount of food as well to prevent upsetting your stomach.  I am prescribing you an antibiotic called Keflex.  Please take this for the next 5 days.  Please do not stop taking this early.  Please return to the ER with any new or worsening symptoms.  It was a pleasure to meet you.

## 2020-08-21 ENCOUNTER — Encounter: Payer: Self-pay | Admitting: *Deleted

## 2020-08-29 ENCOUNTER — Ambulatory Visit (INDEPENDENT_AMBULATORY_CARE_PROVIDER_SITE_OTHER): Payer: Medicaid Other | Admitting: Internal Medicine

## 2020-08-29 ENCOUNTER — Encounter: Payer: Self-pay | Admitting: Internal Medicine

## 2020-08-29 ENCOUNTER — Other Ambulatory Visit: Payer: Self-pay

## 2020-08-29 VITALS — BP 118/75 | HR 115 | Temp 97.3°F | Ht 64.0 in | Wt 133.2 lb

## 2020-08-29 DIAGNOSIS — F1010 Alcohol abuse, uncomplicated: Secondary | ICD-10-CM

## 2020-08-29 DIAGNOSIS — F17200 Nicotine dependence, unspecified, uncomplicated: Secondary | ICD-10-CM

## 2020-08-29 DIAGNOSIS — R7989 Other specified abnormal findings of blood chemistry: Secondary | ICD-10-CM

## 2020-08-29 DIAGNOSIS — K743 Primary biliary cirrhosis: Secondary | ICD-10-CM | POA: Diagnosis not present

## 2020-08-29 MED ORDER — URSODIOL 300 MG PO CAPS: 300.0000 mg | ORAL_CAPSULE | Freq: Three times a day (TID) | ORAL | 11 refills | Status: DC
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
## 2020-08-29 NOTE — Patient Instructions (Addendum)
Your laboratory results are concerning for an underlying condition called primary biliary cholangitis or PBC for short.  One of your autoimmune markers is also markedly positive.    We may need to consider liver biopsy in the near future.   I will go ahead and start you on a medicine called ursodiol which treats PBC and should help with your itching.  You will need to take 1 capsule 3 times a day.  If this is difficult, then take 1 capsule in the morning and 2 capsules at night.  Follow-up with me in 6 to 8 weeks or sooner if needed.  At [facility] we value your feedback. You may receive a survey about your visit today. Please share your experience as we strive to create trusting relationships with our patients to provide genuine, compassionate, quality care.  We appreciate your understanding and patience as we review any laboratory studies, imaging, and other diagnostic tests that are ordered as we care for you. Our office policy is 5 business days for review of these results, and any emergent or urgent results are addressed in a timely manner for your best interest. If you do not hear from our office in 1 week, please contact us.   We also encourage the use of MyChart, which contains your medical information for your review as well. If you are not enrolled in this feature, an access code is on this after visit summary for your convenience. Thank you for allowing [place] to be involved in your care.  It was great to see you today!  I hope you have a great rest of your fall!!    Avion Kutzer K. Abbey Chatters, D.O. Gastroenterology and Hepatology [facility]

## 2020-10-16 ENCOUNTER — Ambulatory Visit: Payer: Medicaid Other | Admitting: Internal Medicine

## 2020-10-16 ENCOUNTER — Encounter: Payer: Self-pay | Admitting: Internal Medicine

## 2021-02-26 ENCOUNTER — Other Ambulatory Visit: Payer: Self-pay

## 2021-02-26 ENCOUNTER — Emergency Department (HOSPITAL_COMMUNITY)
Admission: EM | Admit: 2021-02-26 | Discharge: 2021-02-26 | Disposition: A | Discharge status: Home / Self Care | Payer: Medicaid Other | Attending: Emergency Medicine | Admitting: Emergency Medicine

## 2021-02-26 ENCOUNTER — Emergency Department (HOSPITAL_COMMUNITY): Payer: Medicaid Other

## 2021-02-26 ENCOUNTER — Encounter (HOSPITAL_COMMUNITY): Payer: Self-pay | Admitting: Emergency Medicine

## 2021-02-26 DIAGNOSIS — R109 Unspecified abdominal pain: Secondary | ICD-10-CM

## 2021-02-26 DIAGNOSIS — N83201 Unspecified ovarian cyst, right side: Secondary | ICD-10-CM

## 2021-02-26 DIAGNOSIS — K769 Liver disease, unspecified: Secondary | ICD-10-CM

## 2021-02-26 DIAGNOSIS — R1084 Generalized abdominal pain: Secondary | ICD-10-CM | POA: Diagnosis not present

## 2021-02-26 DIAGNOSIS — F17210 Nicotine dependence, cigarettes, uncomplicated: Secondary | ICD-10-CM | POA: Diagnosis not present

## 2021-02-26 DIAGNOSIS — K829 Disease of gallbladder, unspecified: Secondary | ICD-10-CM | POA: Diagnosis not present

## 2021-02-26 DIAGNOSIS — R1011 Right upper quadrant pain: Secondary | ICD-10-CM | POA: Diagnosis not present

## 2021-02-26 DIAGNOSIS — K7689 Other specified diseases of liver: Secondary | ICD-10-CM | POA: Diagnosis not present

## 2021-02-26 DIAGNOSIS — D696 Thrombocytopenia, unspecified: Secondary | ICD-10-CM

## 2021-02-26 DIAGNOSIS — K314 Gastric diverticulum: Secondary | ICD-10-CM | POA: Diagnosis not present

## 2021-02-26 DIAGNOSIS — M545 Low back pain, unspecified: Secondary | ICD-10-CM | POA: Diagnosis not present

## 2021-02-26 DIAGNOSIS — F1010 Alcohol abuse, uncomplicated: Secondary | ICD-10-CM

## 2021-02-26 DIAGNOSIS — M549 Dorsalgia, unspecified: Secondary | ICD-10-CM | POA: Diagnosis not present

## 2021-02-26 DIAGNOSIS — K5790 Diverticulosis of intestine, part unspecified, without perforation or abscess without bleeding: Secondary | ICD-10-CM | POA: Diagnosis not present

## 2021-02-26 LAB — COMPREHENSIVE METABOLIC PANEL
ALT: 86 U/L — ABNORMAL HIGH (ref 0–44)
AST: 290 U/L — ABNORMAL HIGH (ref 15–41)
Albumin: 4.1 g/dL (ref 3.5–5.0)
Alkaline Phosphatase: 83 U/L (ref 38–126)
Anion gap: 16 — ABNORMAL HIGH (ref 5–15)
BUN: 11 mg/dL (ref 6–20)
CO2: 25 mmol/L (ref 22–32)
Calcium: 8.6 mg/dL — ABNORMAL LOW (ref 8.9–10.3)
Chloride: 97 mmol/L — ABNORMAL LOW (ref 98–111)
Creatinine, Ser: 0.9 mg/dL (ref 0.44–1.00)
GFR, Estimated: >60 mL/min (ref >60)
Glucose, Bld: 91 mg/dL (ref 70–99)
Potassium: 3.2 mmol/L — ABNORMAL LOW (ref 3.5–5.1)
Sodium: 138 mmol/L (ref 135–145)
Total Bilirubin: 0.8 mg/dL (ref 0.3–1.2)
Total Protein: 7.5 g/dL (ref 6.5–8.1)

## 2021-02-26 LAB — CBC WITH DIFFERENTIAL/PLATELET
Abs Immature Granulocytes: 0.01 10*3/uL (ref 0.00–0.07)
Basophils Absolute: 0 10*3/uL (ref 0.0–0.1)
Basophils Relative: 1 %
Eosinophils Absolute: 0.1 10*3/uL (ref 0.0–0.5)
Eosinophils Relative: 2 %
HCT: 34.4 % — ABNORMAL LOW (ref 36.0–46.0)
Hemoglobin: 11.6 g/dL — ABNORMAL LOW (ref 12.0–15.0)
Immature Granulocytes: 0 %
Lymphocytes Relative: 40 %
Lymphs Abs: 1.6 10*3/uL (ref 0.7–4.0)
MCH: 34.7 pg — ABNORMAL HIGH (ref 26.0–34.0)
MCHC: 33.7 g/dL (ref 30.0–36.0)
MCV: 103 fL — ABNORMAL HIGH (ref 80.0–100.0)
Monocytes Absolute: 0.4 10*3/uL (ref 0.1–1.0)
Monocytes Relative: 10 %
Neutro Abs: 1.9 10*3/uL (ref 1.7–7.7)
Neutrophils Relative %: 47 %
Platelets: 78 10*3/uL — ABNORMAL LOW (ref 150–400)
RBC: 3.34 MIL/uL — ABNORMAL LOW (ref 3.87–5.11)
RDW: 18.1 % — ABNORMAL HIGH (ref 11.5–15.5)
WBC: 3.9 10*3/uL — ABNORMAL LOW (ref 4.0–10.5)
nRBC: 0 % (ref 0.0–0.2)

## 2021-02-26 LAB — LIPASE, BLOOD: Lipase: 58 U/L — ABNORMAL HIGH (ref 11–51)

## 2021-02-26 LAB — URINALYSIS, MICROSCOPIC (REFLEX): RBC / HPF: >50 RBC/hpf (ref 0–5)

## 2021-02-26 LAB — URINALYSIS, ROUTINE W REFLEX MICROSCOPIC

## 2021-02-26 LAB — PREGNANCY, URINE: Preg Test, Ur: NEGATIVE

## 2021-02-26 MED ORDER — NAPROXEN 500 MG PO TABS: 500.0000 mg | ORAL_TABLET | Freq: Two times a day (BID) | ORAL | 0 refills | Status: DC

## 2021-02-26 NOTE — Discharge Instructions (Addendum)
Your[facility] to [facility] for [facility], it is very important that you have this follow-up within the next 2 weeks.  It is also important to see a family doctor regarding the damage to your liver, your low blood counts and getting help with your alcohol use.  Please see the list of treatment programs below  Substance Abuse Treatment Programs  Intensive Outpatient Programs [place]     [place]. [place], [place]       [facility] [address] [place], [place]  [facility]     (Inpatient and outpatient)     [address]           [place] [phone] (Suboxone and Methadone)  [place], [place] [address]      [place] [address] [place], [place]  [facility] [place] (Outpatient/Inpatient, [place])    (insurance only) [phone]             [facility] ([place]) [place], [place]     [facility]     [address]     [place], [place]       [facility] (for caregivers and family) [phone] [address] [place]een [place]. [place], [place]      Residential Treatment Programs [facility]      [address] [place], [place] [address]  [phone]       [facility] [address], [place], [place] [address] [phone]  [facility] [place]        [phone]       [facility] [place] [phone]  [facility] ([place].)             [place], [place] or [place] of [place] [address] [place], [address] [phone]  [facility] [place]    [address]      [place], [place]       [facility] [address] [place], [place]  [place]   [address] [place], [place] [address]     [phone]      Admissions: 8am-3pm M-F  [facility] (RTS) [address] [place], [place]  [facility] Program: Residential Program [phone] Days)   [place], [place] or [phone]  [facility]: [facility] [place], [place] (Walk in Hours over the weekend or by referral)  [facility] [place], [place], [place] [address] [phone]  Crisis Mobile: [facility]:  [phone] (for crisis response 24 hours a day) [facility] Hotline:      [phone] Outpatient Psychiatry and Counseling  [facility]: Mobile Crisis Management 24 hours:  [phone]  [facility] of the [employer] sliding scale fee and walk in schedule: M-F 8am-12pm/1pm-3pm [place], [place] [address] [place], [place] [address] [phone]  [facility] (Formerly known as [facility])- new patient walk-in appointments available Monday - Friday 8am -3pm.          [address] [place], [place] [address] [phone] or crisis line- [place] [facility]/ Intensive Outpatient Therapy Program [place], [place] [address] [place]      [phone] [address] [place], [place] [address]                 [place]   [facility] [phone]. [place], [place] [address]   [employer] of Care          [address] Johnette Abraham  [place], [place] [address]       [phone]  [place], [place], [place] [address] [phone]  [facility]    [address] [place], [place] [address]     [place], [place] Joycelyn Man     [place] [address]     [phone]       [facility] [place] [address]  [facility]     [address], [place], [place], [place] [address] [place]     [address]     [place], [place] [address]     [phone]       [facility] [address] [place], [place] [address] [phone] Resources for Temporary Residential Assistance/Crisis [facility]) M-F 8am-3pm   [address], [place] [address]   [phone] Services include: laundry, barbering, support groups, case management, phone  & computer access, showers, AA/NA mtgs, mental health/substance abuse nurse, job skills class, disability information, VA assistance, spiritual classes, etc.   [facility]   [address], [place]     [place] (women and children)       [place]. [place], [place] [address] [phone] [email or web address] for application and process Application Required  [facility]   [address]    [place]  [address]     [phone]  [place], [place] [address] [phone]([email or web address] application appt.) Application Required  [facility] (women only)    [address]     [place], [place] [address]     [phone]      Intake starts 6pm daily Need valid ID, SSC, & Police  report [employer] [address] [place], [place] [phone] Application Required  [employer] (men only)     [place].      [place], [place]       [place] (Pregnant women only) [address] [place], [place]  [facility]      [place] [address].      [place], [place] [address]     [phone]             [facility] [address] [place], [place] 90 day commitment/SA/Application process  [facility](men only)     [address]     [place], [place]       Check-in at [facility] [address] [place],  [address] [phone] Men/Women/Women and Children must be there by 7 pm  [place], [place]

## 2021-02-26 NOTE — ED Notes (Signed)
Pt states she has been experiencing increased vaginal bleeding x 2 weeks.

## 2021-02-26 NOTE — ED Notes (Signed)
Patient transported to CT 
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
## 2021-02-26 NOTE — ED Triage Notes (Signed)
Pt c/o severe right sided abd pain that radiates to flank x 2 days. Pt has been drinking alcohol today.

## 2021-02-27 ENCOUNTER — Telehealth: Payer: Self-pay | Admitting: *Deleted

## 2021-02-27 NOTE — Telephone Encounter (Signed)
Transition Care Management Follow-up Telephone Call  Date of discharge and from where: 02/26/2021 - [facility]  How have you been since you were released from the hospital? "A little better"  Any questions or concerns? No  Items Reviewed:  Did the pt receive and understand the discharge instructions provided? Yes   Medications obtained and verified? Yes   Other? No   Any new allergies since your discharge? No   Dietary orders reviewed? No  Do you have support at home? Yes    Functional Questionnaire: (I = Independent and D = Dependent) ADLs: I  Bathing/Dressing- I  Meal Prep- I  Eating- I  Maintaining continence- I  Transferring/Ambulation- I  Managing Meds- I  Follow up appointments reviewed:   PCP Hospital f/u appt confirmed? No    Specialist Hospital f/u appt confirmed? No    Are transportation arrangements needed? No   If their condition worsens, is the pt aware to call PCP or go to the Emergency Dept.? Yes  Was the patient provided with contact information for the PCP's office or ED? Yes  Was to pt encouraged to call back with questions or concerns? Yes

## 2021-03-11 ENCOUNTER — Ambulatory Visit (INDEPENDENT_AMBULATORY_CARE_PROVIDER_SITE_OTHER): Payer: Medicaid Other | Admitting: Obstetrics & Gynecology

## 2021-03-11 ENCOUNTER — Other Ambulatory Visit: Payer: Self-pay

## 2021-03-11 ENCOUNTER — Encounter: Payer: Self-pay | Admitting: Obstetrics & Gynecology

## 2021-03-11 VITALS — BP 113/74 | HR 95 | Ht 64.0 in | Wt 133.0 lb

## 2021-03-11 DIAGNOSIS — R1031 Right lower quadrant pain: Secondary | ICD-10-CM | POA: Diagnosis not present

## 2021-03-11 DIAGNOSIS — N83201 Unspecified ovarian cyst, right side: Secondary | ICD-10-CM
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
## 2021-03-13 NOTE — Patient Instructions (Addendum)
Your procedure is scheduled on: 03/19/2021  Report to [facility] at 9:45    AM.  Call this number if you have problems the morning of surgery: [phone]   Remember:   Do not Eat after midnight. You may have clear liquids until 0715. At [place], drink your carb drink and have nothing else to drink after this.         No Smoking the morning of surgery  :  Take these medicines the morning of surgery with A SIP OF WATER: none   Do not wear jewelry, make-up or nail polish.  Do not wear lotions, powders, or perfumes. You may wear deodorant.  Do not shave 48 hours prior to surgery. Men may shave face and neck.  Do not bring valuables to the hospital.  Contacts, dentures or bridgework may not be worn into surgery.  Leave suitcase in the car. After surgery it may be brought to your room.  For patients admitted to the hospital, checkout time is 11:00 AM the day of discharge.   Patients discharged the day of surgery will not be allowed to drive home.    Special Instructions: Shower using CHG night before surgery and shower the day of surgery use CHG.  Use special wash - you have one bottle of CHG for all showers.  You should use approximately 1/2 of the bottle for each shower.  How to Use Chlorhexidine for Bathing Chlorhexidine gluconate (CHG) is a germ-killing (antiseptic) solution that is used to clean the skin. It can get rid of the bacteria that normally live on the skin and can keep them away for about 24 hours. To clean your skin with CHG, you may be given:  A CHG solution to use in the shower or as part of a sponge bath.  A prepackaged cloth that contains CHG. Cleaning your skin with CHG may help lower the risk for infection:  While you are staying in the intensive care unit of the hospital.  If you have a vascular access, such as a central line, to provide short-term or long-term access to your veins.  If you have a catheter to drain urine from your bladder.  If you  are on a ventilator. A ventilator is a machine that helps you breathe by moving air in and out of your lungs.  After surgery. What are the risks? Risks of using CHG include:  A skin reaction.  Hearing loss, if CHG gets in your ears.  Eye injury, if CHG gets in your eyes and is not rinsed out.  The CHG product catching fire. Make sure that you avoid smoking and flames after applying CHG to your skin. Do not use CHG:  If you have a chlorhexidine allergy or have previously reacted to chlorhexidine.  On babies younger than 51 months of age. How to use CHG solution  Use CHG only as told by your health care provider, and follow the instructions on the label.  Use the full amount of CHG as directed. Usually, this is one bottle. During a shower Follow these steps when using CHG solution during a shower (unless your health care provider gives you different instructions): 1. Start the shower. 2. Use your normal soap and shampoo to wash your face and hair. 3. Turn off the shower or move out of the shower stream. 4. Pour the CHG onto a clean washcloth. Do not use any type of brush or rough-edged sponge. 5. Starting at your neck, lather your  body down to your toes. Make sure you follow these instructions: ? If you will be having surgery, pay special attention to the part of your body where you will be having surgery. Scrub this area for at least 1 minute. ? Do not use CHG on your head or face. If the solution gets into your ears or eyes, rinse them well with water. ? Avoid your genital area. ? Avoid any areas of skin that have broken skin, cuts, or scrapes. ? Scrub your back and under your arms. Make sure to wash skin folds. 6. Let the lather sit on your skin for 1-2 minutes or as long as told by your health care provider. 7. Thoroughly rinse your entire body in the shower. Make sure that all body creases and crevices are rinsed well. 8. Dry off with a clean towel. Do not put any substances  on your body afterward--such as powder, lotion, or perfume--unless you are told to do so by your health care provider. Only use lotions that are recommended by the manufacturer. 9. Put on clean clothes or pajamas. 10. If it is the night before your surgery, sleep in clean sheets.   During a sponge bath Follow these steps when using CHG solution during a sponge bath (unless your health care provider gives you different instructions): 1. Use your normal soap and shampoo to wash your face and hair. 2. Pour the CHG onto a clean washcloth. 3. Starting at your neck, lather your body down to your toes. Make sure you follow these instructions: ? If you will be having surgery, pay special attention to the part of your body where you will be having surgery. Scrub this area for at least 1 minute. ? Do not use CHG on your head or face. If the solution gets into your ears or eyes, rinse them well with water. ? Avoid your genital area. ? Avoid any areas of skin that have broken skin, cuts, or scrapes. ? Scrub your back and under your arms. Make sure to wash skin folds. 4. Let the lather sit on your skin for 1-2 minutes or as long as told by your health care provider. 5. Using a different clean, wet washcloth, thoroughly rinse your entire body. Make sure that all body creases and crevices are rinsed well. 6. Dry off with a clean towel. Do not put any substances on your body afterward--such as powder, lotion, or perfume--unless you are told to do so by your health care provider. Only use lotions that are recommended by the manufacturer. 7. Put on clean clothes or pajamas. 8. If it is the night before your surgery, sleep in clean sheets. How to use CHG prepackaged cloths  Only use CHG cloths as told by your health care provider, and follow the instructions on the label.  Use the CHG cloth on clean, dry skin.  Do not use the CHG cloth on your head or face unless your health care provider tells you to.  When  washing with the CHG cloth: ? Avoid your genital area. ? Avoid any areas of skin that have broken skin, cuts, or scrapes. Before surgery Follow these steps when using a CHG cloth to clean before surgery (unless your health care provider gives you different instructions): 1. Using the CHG cloth, vigorously scrub the part of your body where you will be having surgery. Scrub using a back-and-forth motion for 3 minutes. The area on your body should be completely wet with CHG when you are done  scrubbing. 2. Do not rinse. Discard the cloth and let the area air-dry. Do not put any substances on the area afterward, such as powder, lotion, or perfume. 3. Put on clean clothes or pajamas. 4. If it is the night before your surgery, sleep in clean sheets.   For general bathing Follow these steps when using CHG cloths for general bathing (unless your health care provider gives you different instructions). 1. Use a separate CHG cloth for each area of your body. Make sure you wash between any folds of skin and between your fingers and toes. Wash your body in the following order, switching to a new cloth after each step: ? The front of your neck, shoulders, and chest. ? Both of your arms, under your arms, and your hands. ? Your stomach and groin area, avoiding the genitals. ? Your right leg and foot. ? Your left leg and foot. ? The back of your neck, your back, and your buttocks. 2. Do not rinse. Discard the cloth and let the area air-dry. Do not put any substances on your body afterward--such as powder, lotion, or perfume--unless you are told to do so by your health care provider. Only use lotions that are recommended by the manufacturer. 3. Put on clean clothes or pajamas. Contact a health care provider if:  Your skin gets irritated after scrubbing.  You have questions about using your solution or cloth. Get help right away if:  Your eyes become very red or swollen.  Your eyes itch badly.  Your skin  itches badly and is red or swollen.  Your hearing changes.  You have trouble seeing.  You have swelling or tingling in your mouth or throat.  You have trouble breathing.  You swallow any chlorhexidine. Summary  Chlorhexidine gluconate (CHG) is a germ-killing (antiseptic) solution that is used to clean the skin. Cleaning your skin with CHG may help to lower your risk for infection.  You may be given CHG to use for bathing. It may be in a bottle or in a prepackaged cloth to use on your skin. Carefully follow your health care provider's instructions and the instructions on the product label.  Do not use CHG if you have a chlorhexidine allergy.  Contact your health care provider if your skin gets irritated after scrubbing. This information is not intended to replace advice given to you by your health care provider. Make sure you discuss any questions you have with your health care provider. Document Revised: 04/19/2020 Document Reviewed: 04/19/2020 Elsevier Patient Education  2021 [employer].  Unilateral Salpingo-Oophorectomy, Care After This sheet gives you information about how to care for yourself after your procedure. Your health care provider may also give you more specific instructions. If you have problems or questions, contact your health care provider. What can I expect after the procedure? After the procedure, it is common to have:  Abdominal pain.  Some occasional vaginal bleeding (spotting).  Tiredness. Follow these instructions at home: Incision care  Keep your incision area and your bandage (dressing) clean and dry.  Follow instructions from your health care provider about how to take care of your incision. Make sure you: ? Wash your hands with soap and water before you change your dressing. If soap and water are not available, use hand sanitizer. ? Change your dressing as told by your health care provider. ? Leave stitches (sutures), staples, skin glue, or  adhesive strips in place. These skin closures may need to stay in place for 2  weeks or longer. If adhesive strip edges start to loosen and curl up, you may trim the loose edges. Do not remove adhesive strips completely unless your health care provider tells you to do that.  Check your incision area every day for signs of infection. Check for: ? Redness, swelling, or pain. ? Fluid or blood. ? Warmth. ? Pus or a bad smell.   Activity  Do not drive or use heavy machinery while taking prescription pain medicine.  Do not drive for 24 hours if you received a medicine to help you relax (sedative).  Take frequent, short walks throughout the day. Rest when you get tired. Ask your health care provider what activities are safe for you.  Avoid activities that require great effort. Also, avoid heavy lifting. Do not lift anything that is heavier than 5 lb (2.3 kg), or the limit that your health care provider tells you, until he or she says that it is safe to do so.  Do not douche, use tampons, or have sex until your health care provider approves. General instructions  To prevent or treat constipation while you are taking prescription pain medicine, your health care provider may recommend that you: ? Drink enough fluid to keep your urine pale yellow. ? Take over-the-counter or prescription medicines. ? Eat foods that are high in fiber, such as fresh fruits and vegetables, whole grains, and beans. ? Limit foods that are high in fat and processed sugars, such as fried and sweet foods.  Take over-the-counter and prescription medicines only as told by your health care provider.  Do not take baths, swim, or use a hot tub until your health care provider approves. Ask your health care provider if you may take showers. You may only be allowed to take sponge baths.  Wear compression stockings as told by your health care provider. These stockings help to prevent blood clots and reduce swelling in your  legs.  Keep all follow-up visits as told by your health care provider. This is important. Contact a health care provider if:  You have pain when you urinate.  You have pus or a bad smelling discharge coming from your vagina.  You have redness, swelling, or pain around your incision.  You have fluid or blood coming from your incision.  Your incision feels warm to the touch.  You have pus or a bad smell coming from your incision.  You have a fever.  Your incision starts to break open.  You have abdominal pain that gets worse or does not get better with medicine.  You develop a rash.  You develop nausea and vomiting.  You feel lightheaded. Get help right away if:  You develop pain in your chest or leg.  You develop shortness of breath.  You faint.  You have increased bleeding from your vagina. Summary  After the procedure, it is common to have pain, tiredness, and occasional bleeding from the vagina.  Follow instructions from your health care provider about how to take care of your incision.  Check your incision every day for signs of infection and report any symptoms to your health care provider.  Follow instructions from your health care provider about activities and restrictions. This information is not intended to replace advice given to you by your health care provider. Make sure you discuss any questions you have with your health care provider. Document Revised: 10/15/2017 Document Reviewed: 02/11/2017 Elsevier Patient Education  [place] Anesthesia, Adult, Care After This sheet  gives you information about how to care for yourself after your procedure. Your health care provider may also give you more specific instructions. If you have problems or questions, contact your health care provider. What can I expect after the procedure? After the procedure, the following side effects are common:  Pain or discomfort at the IV  site.  Nausea.  Vomiting.  Sore throat.  Trouble concentrating.  Feeling cold or chills.  Feeling weak or tired.  Sleepiness and fatigue.  Soreness and body aches. These side effects can affect parts of the body that were not involved in surgery. Follow these instructions at home: For the time period you were told by your health care provider:  Rest.  Do not participate in activities where you could fall or become injured.  Do not drive or use machinery.  Do not drink alcohol.  Do not take sleeping pills or medicines that cause drowsiness.  Do not make important decisions or sign legal documents.  Do not take care of children on your own.   Eating and drinking  Follow any instructions from your health care provider about eating or drinking restrictions.  When you feel hungry, start by eating small amounts of foods that are soft and easy to digest (bland), such as toast. Gradually return to your regular diet.  Drink enough fluid to keep your urine pale yellow.  If you vomit, rehydrate by drinking water, juice, or clear broth. General instructions  If you have sleep apnea, surgery and certain medicines can increase your risk for breathing problems. Follow instructions from your health care provider about wearing your sleep device: ? Anytime you are sleeping, including during daytime naps. ? While taking prescription pain medicines, sleeping medicines, or medicines that make you drowsy.  Have a responsible adult stay with you for the time you are told. It is important to have someone help care for you until you are awake and alert.  Return to your normal activities as told by your health care provider. Ask your health care provider what activities are safe for you.  Take over-the-counter and prescription medicines only as told by your health care provider.  If you smoke, do not smoke without supervision.  Keep all follow-up visits as told by your health care  provider. This is important. Contact a health care provider if:  You have nausea or vomiting that does not get better with medicine.  You cannot eat or drink without vomiting.  You have pain that does not get better with medicine.  You are unable to pass urine.  You develop a skin rash.  You have a fever.  You have redness around your IV site that gets worse. Get help right away if:  You have difficulty breathing.  You have chest pain.  You have blood in your urine or stool, or you vomit blood. Summary  After the procedure, it is common to have a sore throat or nausea. It is also common to feel tired.  Have a responsible adult stay with you for the time you are told. It is important to have someone help care for you until you are awake and alert.  When you feel hungry, start by eating small amounts of foods that are soft and easy to digest (bland), such as toast. Gradually return to your regular diet.  Drink enough fluid to keep your urine pale yellow.  Return to your normal activities as told by your health care provider. Ask your health care provider what  activities are safe for you. This information is not intended to replace advice given to you by your health care provider. Make sure you discuss any questions you have with your health care provider. Document Revised: 07/18/2020 Document Reviewed: 02/15/2020 Elsevier Patient Education  2021 [employer].

## 2021-03-16 ENCOUNTER — Other Ambulatory Visit: Payer: Self-pay | Admitting: Obstetrics & Gynecology

## 2021-03-18 ENCOUNTER — Encounter (HOSPITAL_COMMUNITY)
Admission: RE | Admit: 2021-03-18 | Discharge: 2021-03-18 | Disposition: A | Discharge status: Home / Self Care | Payer: Medicaid Other | Source: Ambulatory Visit | Attending: Obstetrics & Gynecology | Admitting: Obstetrics & Gynecology

## 2021-03-18 ENCOUNTER — Other Ambulatory Visit (HOSPITAL_COMMUNITY)
Admission: RE | Admit: 2021-03-18 | Discharge: 2021-03-18 | Disposition: A | Discharge status: Home / Self Care | Payer: Medicaid Other | Source: Ambulatory Visit | Attending: Obstetrics & Gynecology | Admitting: Obstetrics & Gynecology

## 2021-03-18 ENCOUNTER — Other Ambulatory Visit: Payer: Self-pay

## 2021-03-18 ENCOUNTER — Encounter (HOSPITAL_COMMUNITY): Payer: Self-pay

## 2021-03-18 DIAGNOSIS — Z20822 Contact with and (suspected) exposure to covid-19: Secondary | ICD-10-CM | POA: Diagnosis not present

## 2021-03-18 DIAGNOSIS — Z01812 Encounter for preprocedural laboratory examination: Secondary | ICD-10-CM | POA: Diagnosis not present

## 2021-03-18 LAB — URINALYSIS, ROUTINE W REFLEX MICROSCOPIC
Glucose, UA: NEGATIVE mg/dL
Hgb urine dipstick: NEGATIVE
Ketones, ur: 5 mg/dL — AB
Nitrite: NEGATIVE
Protein, ur: 100 mg/dL — AB
Specific Gravity, Urine: 1.024 (ref 1.005–1.030)
pH: 6 (ref 5.0–8.0)

## 2021-03-18 LAB — RAPID HIV SCREEN (HIV 1/2 AB+AG)
HIV 1/2 Antibodies: NONREACTIVE
HIV-1 P24 Antigen - HIV24: NONREACTIVE

## 2021-03-18 LAB — COMPREHENSIVE METABOLIC PANEL
ALT: 57 U/L — ABNORMAL HIGH (ref 0–44)
AST: 87 U/L — ABNORMAL HIGH (ref 15–41)
Albumin: 4.2 g/dL (ref 3.5–5.0)
Alkaline Phosphatase: 84 U/L (ref 38–126)
Anion gap: 10 (ref 5–15)
BUN: 7 mg/dL (ref 6–20)
CO2: 25 mmol/L (ref 22–32)
Calcium: 9.5 mg/dL (ref 8.9–10.3)
Chloride: 101 mmol/L (ref 98–111)
Creatinine, Ser: 0.58 mg/dL (ref 0.44–1.00)
GFR, Estimated: >60 mL/min (ref >60)
Glucose, Bld: 93 mg/dL (ref 70–99)
Potassium: 3.4 mmol/L — ABNORMAL LOW (ref 3.5–5.1)
Sodium: 136 mmol/L (ref 135–145)
Total Bilirubin: 2 mg/dL — ABNORMAL HIGH (ref 0.3–1.2)
Total Protein: 7.9 g/dL (ref 6.5–8.1)

## 2021-03-18 LAB — CBC
HCT: 39.3 % (ref 36.0–46.0)
Hemoglobin: 13.1 g/dL (ref 12.0–15.0)
MCH: 35 pg — ABNORMAL HIGH (ref 26.0–34.0)
MCHC: 33.3 g/dL (ref 30.0–36.0)
MCV: 105.1 fL — ABNORMAL HIGH (ref 80.0–100.0)
Platelets: 118 10*3/uL — ABNORMAL LOW (ref 150–400)
RBC: 3.74 MIL/uL — ABNORMAL LOW (ref 3.87–5.11)
RDW: 18.2 % — ABNORMAL HIGH (ref 11.5–15.5)
WBC: 3.9 10*3/uL — ABNORMAL LOW (ref 4.0–10.5)
nRBC: 0 % (ref 0.0–0.2)

## 2021-03-18 LAB — SARS CORONAVIRUS 2 (TAT 6-24 HRS): SARS Coronavirus 2: NEGATIVE

## 2021-03-18 LAB — HCG, QUANTITATIVE, PREGNANCY: hCG, Beta Chain, Quant, S: <1 m[IU]/mL (ref <5)

## 2021-03-19 ENCOUNTER — Encounter (HOSPITAL_COMMUNITY)
Admission: RE | Disposition: A | Discharge status: Home / Self Care | Payer: Self-pay | Source: Home / Self Care | Attending: Obstetrics & Gynecology

## 2021-03-19 ENCOUNTER — Ambulatory Visit (HOSPITAL_COMMUNITY)
Admission: RE | Admit: 2021-03-19 | Discharge: 2021-03-19 | Disposition: A | Discharge status: Home / Self Care | Payer: Medicaid Other | Attending: Obstetrics & Gynecology | Admitting: Obstetrics & Gynecology

## 2021-03-19 ENCOUNTER — Encounter (HOSPITAL_COMMUNITY): Payer: Self-pay | Admitting: Obstetrics & Gynecology

## 2021-03-19 ENCOUNTER — Ambulatory Visit (HOSPITAL_COMMUNITY): Payer: Medicaid Other | Admitting: Certified Registered Nurse Anesthetist

## 2021-03-19 DIAGNOSIS — K660 Peritoneal adhesions (postprocedural) (postinfection): Secondary | ICD-10-CM | POA: Insufficient documentation

## 2021-03-19 DIAGNOSIS — D270 Benign neoplasm of right ovary: Secondary | ICD-10-CM | POA: Diagnosis not present

## 2021-03-19 DIAGNOSIS — F17210 Nicotine dependence, cigarettes, uncomplicated: Secondary | ICD-10-CM | POA: Diagnosis not present

## 2021-03-19 DIAGNOSIS — I708 Atherosclerosis of other arteries: Secondary | ICD-10-CM | POA: Insufficient documentation

## 2021-03-19 DIAGNOSIS — K760 Fatty (change of) liver, not elsewhere classified: Secondary | ICD-10-CM | POA: Diagnosis not present

## 2021-03-19 DIAGNOSIS — N83529 Torsion of fallopian tube, unspecified side: Secondary | ICD-10-CM

## 2021-03-19 DIAGNOSIS — I700 Atherosclerosis of aorta: Secondary | ICD-10-CM | POA: Insufficient documentation

## 2021-03-19 DIAGNOSIS — R1031 Right lower quadrant pain: Secondary | ICD-10-CM

## 2021-03-19 HISTORY — PX: LAPAROSCOPIC UNILATERAL SALPINGO OOPHERECTOMY: SHX5935

## 2021-03-19 SURGERY — SALPINGO-OOPHORECTOMY, UNILATERAL, LAPAROSCOPIC
Anesthesia: General | Laterality: Right

## 2021-03-19 MED ORDER — SUGAMMADEX SODIUM 500 MG/5ML IV SOLN
INTRAVENOUS | Status: DC | PRN
  Administered 2021-03-19: 200 mg via INTRAVENOUS

## 2021-03-19 MED ORDER — KETOROLAC TROMETHAMINE 10 MG PO TABS: 10.0000 mg | ORAL_TABLET | Freq: Three times a day (TID) | ORAL | 0 refills | Status: DC | PRN

## 2021-03-19 MED ORDER — 0.9 % SODIUM CHLORIDE (POUR BTL) OPTIME
TOPICAL | Status: DC | PRN
  Administered 2021-03-19: 3000 mL
  Administered 2021-03-19: 1000 mL

## 2021-03-19 MED ORDER — CEFAZOLIN SODIUM-DEXTROSE 2-4 GM/100ML-% IV SOLN
2.0000 g | INTRAVENOUS | Status: AC
  Administered 2021-03-19: 2 g via INTRAVENOUS
  Filled 2021-03-19: qty 100

## 2021-03-19 MED ORDER — ORAL CARE MOUTH RINSE: 15.0000 mL | Freq: Once | OROMUCOSAL | Status: AC

## 2021-03-19 MED ORDER — PROPOFOL 10 MG/ML IV BOLUS
INTRAVENOUS | Status: DC | PRN
  Administered 2021-03-19: 120 mg via INTRAVENOUS
  Administered 2021-03-19 (×2): 30 mg via INTRAVENOUS

## 2021-03-19 MED ORDER — LACTATED RINGERS IV SOLN: INTRAVENOUS | Status: DC

## 2021-03-19 MED ORDER — DEXMEDETOMIDINE (PRECEDEX) IN NS 20 MCG/5ML (4 MCG/ML) IV SYRINGE
PREFILLED_SYRINGE | INTRAVENOUS | Status: AC
  Filled 2021-03-19: qty 5

## 2021-03-19 MED ORDER — DEXAMETHASONE SODIUM PHOSPHATE 10 MG/ML IJ SOLN
INTRAMUSCULAR | Status: DC | PRN
  Administered 2021-03-19: 10 mg via INTRAVENOUS

## 2021-03-19 MED ORDER — FENTANYL CITRATE (PF) 100 MCG/2ML IJ SOLN: 25.0000 ug | INTRAMUSCULAR | Status: DC | PRN

## 2021-03-19 MED ORDER — LACTATED RINGERS IV SOLN: INTRAVENOUS | Status: DC | PRN

## 2021-03-19 MED ORDER — ONDANSETRON 8 MG PO TBDP: 8.0000 mg | ORAL_TABLET | Freq: Three times a day (TID) | ORAL | 0 refills | Status: DC | PRN

## 2021-03-19 MED ORDER — ONDANSETRON HCL 4 MG/2ML IJ SOLN
INTRAMUSCULAR | Status: AC
  Filled 2021-03-19: qty 2

## 2021-03-19 MED ORDER — LIDOCAINE HCL (PF) 2 % IJ SOLN
INTRAMUSCULAR | Status: AC
  Filled 2021-03-19: qty 5

## 2021-03-19 MED ORDER — MIDAZOLAM HCL 2 MG/2ML IJ SOLN
INTRAMUSCULAR | Status: DC | PRN
  Administered 2021-03-19: 2 mg via INTRAVENOUS

## 2021-03-19 MED ORDER — DEXAMETHASONE SODIUM PHOSPHATE 10 MG/ML IJ SOLN
INTRAMUSCULAR | Status: AC
  Filled 2021-03-19: qty 1

## 2021-03-19 MED ORDER — MIDAZOLAM HCL 2 MG/2ML IJ SOLN
INTRAMUSCULAR | Status: AC
  Filled 2021-03-19: qty 2

## 2021-03-19 MED ORDER — ONDANSETRON HCL 4 MG/2ML IJ SOLN: 4.0000 mg | Freq: Once | INTRAMUSCULAR | Status: DC | PRN

## 2021-03-19 MED ORDER — LIDOCAINE HCL (CARDIAC) PF 100 MG/5ML IV SOSY
PREFILLED_SYRINGE | INTRAVENOUS | Status: DC | PRN
  Administered 2021-03-19: 80 mg via INTRATRACHEAL

## 2021-03-19 MED ORDER — DEXMEDETOMIDINE (PRECEDEX) IN NS 20 MCG/5ML (4 MCG/ML) IV SYRINGE
PREFILLED_SYRINGE | INTRAVENOUS | Status: DC | PRN
  Administered 2021-03-19: 8 ug via INTRAVENOUS
  Administered 2021-03-19 (×2): 12 ug via INTRAVENOUS

## 2021-03-19 MED ORDER — FENTANYL CITRATE (PF) 250 MCG/5ML IJ SOLN
INTRAMUSCULAR | Status: AC
  Filled 2021-03-19: qty 5

## 2021-03-19 MED ORDER — FENTANYL CITRATE (PF) 100 MCG/2ML IJ SOLN
INTRAMUSCULAR | Status: DC | PRN
  Administered 2021-03-19: 100 ug via INTRAVENOUS
  Administered 2021-03-19 (×3): 50 ug via INTRAVENOUS

## 2021-03-19 MED ORDER — BUPIVACAINE LIPOSOME 1.3 % IJ SUSP
20.0000 mL | Freq: Once | INTRAMUSCULAR | Status: DC
  Filled 2021-03-19: qty 20

## 2021-03-19 MED ORDER — KETOROLAC TROMETHAMINE 30 MG/ML IJ SOLN
30.0000 mg | Freq: Once | INTRAMUSCULAR | Status: AC
  Filled 2021-03-19: qty 1

## 2021-03-19 MED ORDER — POVIDONE-IODINE 10 % EX SWAB: 2.0000 "application " | Freq: Once | CUTANEOUS | Status: DC

## 2021-03-19 MED ORDER — CHLORHEXIDINE GLUCONATE 0.12 % MT SOLN
15.0000 mL | Freq: Once | OROMUCOSAL | Status: AC
  Administered 2021-03-19: 15 mL via OROMUCOSAL

## 2021-03-19 MED ORDER — ROCURONIUM BROMIDE 10 MG/ML (PF) SYRINGE
PREFILLED_SYRINGE | INTRAVENOUS | Status: DC | PRN
  Administered 2021-03-19: 35 mg via INTRAVENOUS

## 2021-03-19 MED ORDER — PROPOFOL 10 MG/ML IV BOLUS
INTRAVENOUS | Status: AC
  Filled 2021-03-19: qty 20

## 2021-03-19 MED ORDER — ONDANSETRON HCL 4 MG/2ML IJ SOLN
INTRAMUSCULAR | Status: DC | PRN
  Administered 2021-03-19: 4 mg via INTRAVENOUS

## 2021-03-19 MED ORDER — KETOROLAC TROMETHAMINE 30 MG/ML IJ SOLN
INTRAMUSCULAR | Status: AC
  Administered 2021-03-19: 30 mg via INTRAVENOUS
  Filled 2021-03-19: qty 1

## 2021-03-19 MED ORDER — BUPIVACAINE LIPOSOME 1.3 % IJ SUSP
INTRAMUSCULAR | Status: AC
  Filled 2021-03-19: qty 20

## 2021-03-19 MED ORDER — BUPIVACAINE LIPOSOME 1.3 % IJ SUSP
INTRAMUSCULAR | Status: DC | PRN
  Administered 2021-03-19: 20 mL

## 2021-03-19 MED ORDER — OXYCODONE HCL 5 MG PO TABS: 5.0000 mg | ORAL_TABLET | ORAL | 0 refills | Status: DC | PRN

## 2021-03-19 SURGICAL SUPPLY — 48 items
ADH SKN CLS APL DERMABOND .7 (GAUZE/BANDAGES/DRESSINGS) ×1
BAG HAMPER (MISCELLANEOUS) ×2 IMPLANT
BAG RETRIEVAL 10 (BASKET) ×1
BLADE SURG SZ11 CARB STEEL (BLADE) ×2 IMPLANT
CLOTH BEACON ORANGE TIMEOUT ST (SAFETY) ×2 IMPLANT
COVER LIGHT HANDLE STERIS (MISCELLANEOUS) ×4 IMPLANT
COVER WAND RF STERILE (DRAPES) ×2 IMPLANT
DERMABOND ADVANCED (GAUZE/BANDAGES/DRESSINGS) ×1
DERMABOND ADVANCED .7 DNX12 (GAUZE/BANDAGES/DRESSINGS) ×1 IMPLANT
DRSG TEGADERM 2-3/8X2-3/4 SM (GAUZE/BANDAGES/DRESSINGS) ×6 IMPLANT
DRSG TEGADERM 4X4.75 (GAUZE/BANDAGES/DRESSINGS) ×2 IMPLANT
ELECT REM PT RETURN 9FT ADLT (ELECTROSURGICAL) ×2
ELECTRODE REM PT RTRN 9FT ADLT (ELECTROSURGICAL) ×1 IMPLANT
GAUZE 4X4 16PLY RFD (DISPOSABLE) ×2 IMPLANT
GLOVE ECLIPSE 8.0 STRL XLNG CF (GLOVE) ×2 IMPLANT
GLOVE SRG 8 PF TXTR STRL LF DI (GLOVE) ×1 IMPLANT
GLOVE SURG UNDER POLY LF SZ7 (GLOVE) ×10 IMPLANT
GLOVE SURG UNDER POLY LF SZ8 (GLOVE) ×2
GOWN STRL REUS W/TWL LRG LVL3 (GOWN DISPOSABLE) ×2 IMPLANT
GOWN STRL REUS W/TWL XL LVL3 (GOWN DISPOSABLE) ×2 IMPLANT
INST SET LAPROSCOPIC GYN AP (KITS) ×2 IMPLANT
IV NS IRRIG 3000ML ARTHROMATIC (IV SOLUTION) ×2 IMPLANT
KIT TURNOVER KIT A (KITS) ×2 IMPLANT
MANIFOLD NEPTUNE II (INSTRUMENTS) ×2 IMPLANT
NEEDLE 22X1 1/2 (OR ONLY) (NEEDLE) ×2 IMPLANT
NEEDLE HYPO 18GX1.5 BLUNT FILL (NEEDLE) ×2 IMPLANT
NEEDLE HYPO 21X1.5 SAFETY (NEEDLE) ×2 IMPLANT
NEEDLE INSUFFLATION 120MM (ENDOMECHANICALS) ×2 IMPLANT
PACK PERI GYN (CUSTOM PROCEDURE TRAY) ×2 IMPLANT
PAD ARMBOARD 7.5X6 YLW CONV (MISCELLANEOUS) ×2 IMPLANT
SET BASIN LINEN APH (SET/KITS/TRAYS/PACK) ×2 IMPLANT
SET TUBE IRRIG SUCTION NO TIP (IRRIGATION / IRRIGATOR) ×2 IMPLANT
SET TUBE SMOKE EVAC HIGH FLOW (TUBING) ×2 IMPLANT
SHEARS HARMONIC ACE PLUS 36CM (ENDOMECHANICALS) ×2 IMPLANT
SLEEVE XCEL OPT CAN 5 100 (ENDOMECHANICALS) ×2 IMPLANT
SOL ANTI FOG 6CC (MISCELLANEOUS) ×1 IMPLANT
SOLUTION ANTI FOG 6CC (MISCELLANEOUS) ×1
SPONGE GAUZE 2X2 8PLY STRL LF (GAUZE/BANDAGES/DRESSINGS) ×6 IMPLANT
STAPLER VISISTAT 35W (STAPLE) ×2 IMPLANT
SUT VICRYL 0 UR6 27IN ABS (SUTURE) ×4 IMPLANT
SUT VICRYL AB 3-0 FS1 BRD 27IN (SUTURE) ×2 IMPLANT
SYR 10ML LL (SYRINGE) ×2 IMPLANT
SYR 20ML LL LF (SYRINGE) ×4 IMPLANT
SYS BAG RETRIEVAL 10MM (BASKET) ×1
SYSTEM BAG RETRIEVAL 10MM (BASKET) ×1 IMPLANT
TROCAR XCEL NON-BLD 11X100MML (ENDOMECHANICALS) ×2 IMPLANT
TROCAR XCEL NON-BLD 5MMX100MML (ENDOMECHANICALS) ×2 IMPLANT
WARMER LAPAROSCOPE (MISCELLANEOUS) ×2 IMPLANT

## 2021-03-19 NOTE — Anesthesia Postprocedure Evaluation (Signed)
Anesthesia Post Note  Patient: Mallory Mendoza  Procedure(s) Performed: LAPAROSCOPIC RIGHT SALPINGO OOPHORECTOMY (Right )  Patient location during evaluation: Phase II Anesthesia Type: General Level of consciousness: awake Pain management: pain level controlled Vital Signs Assessment: post-procedure vital signs reviewed and stable Respiratory status: spontaneous breathing and respiratory function stable Cardiovascular status: blood pressure returned to baseline and stable Postop Assessment: no headache and no apparent nausea or vomiting Anesthetic complications: no Comments: Late entry   No complications documented.   Last Vitals:  Vitals:   03/19/21 1000  BP: 125/89  Pulse: 72  Resp: 18  Temp: 37.1 C  SpO2: 99%    Last Pain:  Vitals:   03/19/21 1000  TempSrc: Oral  PainSc: [place]

## 2021-03-19 NOTE — Anesthesia Preprocedure Evaluation (Signed)
Anesthesia Evaluation  Patient identified by MRN, date of birth, ID band Patient awake    Reviewed: Allergy & Precautions, H&P , NPO status , Patient's Chart, lab work & pertinent test results, reviewed documented beta blocker date and time   Airway Mallampati: II  TM Distance: >3 FB Neck ROM: full    Dental no notable dental hx.    Pulmonary neg pulmonary ROS, Current Smoker and Patient abstained from smoking.,    Pulmonary exam normal breath sounds clear to auscultation       Cardiovascular Exercise Tolerance: Good negative cardio ROS   Rhythm:regular Rate:Normal     Neuro/Psych PSYCHIATRIC DISORDERS Anxiety Depression negative neurological ROS     GI/Hepatic negative GI ROS, Neg liver ROS,   Endo/Other  negative endocrine ROS  Renal/GU negative Renal ROS  negative genitourinary   Musculoskeletal   Abdominal   Peds  Hematology negative hematology ROS (+)   Anesthesia Other Findings   Reproductive/Obstetrics negative OB ROS                             Anesthesia Physical Anesthesia Plan  ASA: II  Anesthesia Plan: General   Post-op Pain Management:    Induction:   PONV Risk Score and Plan: Propofol infusion  Airway Management Planned:   Additional Equipment:   Intra-op Plan:   Post-operative Plan:   Informed Consent: I have reviewed the patients History and Physical, chart, labs and discussed the procedure including the risks, benefits and alternatives for the proposed anesthesia with the patient or authorized representative who has indicated his/her understanding and acceptance.     Dental Advisory Given  Plan Discussed with: CRNA  Anesthesia Plan Comments:         Anesthesia Quick Evaluation

## 2021-03-19 NOTE — Discharge Instructions (Signed)
Unilateral Salpingo-Oophorectomy, Care After This sheet gives you information about how to care for yourself after your procedure. Your health care provider may also give you more specific instructions. If you have problems or questions, contact your health care provider. What can I expect after the procedure? After the procedure, it is common to have:  Abdominal pain.  Some occasional vaginal bleeding (spotting).  Tiredness. Follow these instructions at home: Incision care  Keep your incision area and your bandage (dressing) clean and dry.  Follow instructions from your health care provider about how to take care of your incision. Make sure you: ? Wash your hands with soap and water before you change your dressing. If soap and water are not available, use hand sanitizer. ? Change your dressing as told by your health care provider. ? Leave stitches (sutures), staples, skin glue, or adhesive strips in place. These skin closures may need to stay in place for 2 weeks or longer. If adhesive strip edges start to loosen and curl up, you may trim the loose edges. Do not remove adhesive strips completely unless your health care provider tells you to do that.  Check your incision area every day for signs of infection. Check for: ? Redness, swelling, or pain. ? Fluid or blood. ? Warmth. ? Pus or a bad smell.   Activity  Do not drive or use heavy machinery while taking prescription pain medicine.  Do not drive for 24 hours if you received a medicine to help you relax (sedative).  Take frequent, short walks throughout the day. Rest when you get tired. Ask your health care provider what activities are safe for you.  Avoid activities that require great effort. Also, avoid heavy lifting. Do not lift anything that is heavier than 5 lb (2.3 kg), or the limit that your health care provider tells you, until he or she says that it is safe to do so.  Do not douche, use tampons, or have sex until your  health care provider approves. General instructions  To prevent or treat constipation while you are taking prescription pain medicine, your health care provider may recommend that you: ? Drink enough fluid to keep your urine pale yellow. ? Take over-the-counter or prescription medicines. ? Eat foods that are high in fiber, such as fresh fruits and vegetables, whole grains, and beans. ? Limit foods that are high in fat and processed sugars, such as fried and sweet foods.  Take over-the-counter and prescription medicines only as told by your health care provider.  Do not take baths, swim, or use a hot tub until your health care provider approves. Ask your health care provider if you may take showers. You may only be allowed to take sponge baths.  Wear compression stockings as told by your health care provider. These stockings help to prevent blood clots and reduce swelling in your legs.  Keep all follow-up visits as told by your health care provider. This is important. Contact a health care provider if:  You have pain when you urinate.  You have pus or a bad smelling discharge coming from your vagina.  You have redness, swelling, or pain around your incision.  You have fluid or blood coming from your incision.  Your incision feels warm to the touch.  You have pus or a bad smell coming from your incision.  You have a fever.  Your incision starts to break open.  You have abdominal pain that gets worse or does not get better with medicine.  You develop a rash.  You develop nausea and vomiting.  You feel lightheaded. Get help right away if:  You develop pain in your chest or leg.  You develop shortness of breath.  You faint.  You have increased bleeding from your vagina. Summary  After the procedure, it is common to have pain, tiredness, and occasional bleeding from the vagina.  Follow instructions from your health care provider about how to take care of your  incision.  Check your incision every day for signs of infection and report any symptoms to your health care provider.  Follow instructions from your health care provider about activities and restrictions. This information is not intended to replace advice given to you by your health care provider. Make sure you discuss any questions you have with your health care provider. Document Revised: 10/15/2017 Document Reviewed: 02/11/2017 Elsevier Patient Education  [place] Sunday Mar 23, 2021. DO NOT USE ADDITIONAL NUMBING MEDICATION UNTIL AFTER Sunday   Bupivacaine Liposomal Suspension for Injection What is this medicine? BUPIVACAINE LIPOSOMAL (bue PIV a kane LIP oh som al) is an anesthetic. It causes loss of feeling in the skin or other tissues. It is used to prevent and to treat pain from some procedures. This medicine may be used for other purposes; ask your health care provider or pharmacist if you have questions. COMMON BRAND NAME(S): EXPAREL What should I tell my health care provider before I take this medicine? They need to know if you have any of these conditions:  G6PD deficiency  heart disease  kidney disease  liver disease  low blood pressure  lung or breathing disease, like asthma  an unusual or allergic reaction to bupivacaine, other medicines, foods, dyes, or preservatives  pregnant or trying to get pregnant  breast-feeding How should I use this medicine? This medicine is injected into the affected area. It is given by a health care provider in a hospital or clinic setting. Talk to your health care provider about the use of this medicine in children. While it may be given to children as young as 6 years for selected conditions, precautions do apply. Overdosage: If you think you have taken too much of this medicine contact a poison control center or emergency room at once. NOTE: This medicine is only for  you. Do not share this medicine with others. What if I miss a dose? This does not apply. What may interact with this medicine? This medicine may interact with the following medications:  acetaminophen  certain antibiotics like dapsone, nitrofurantoin, aminosalicylic acid, sulfonamides  certain medicines for seizures like phenobarbital, phenytoin, valproic acid  chloroquine  cyclophosphamide  flutamide  hydroxyurea  ifosfamide  metoclopramide  nitric oxide  nitroglycerin  nitroprusside  nitrous oxide  other local anesthetics like lidocaine, pramoxine, tetracaine  primaquine  quinine  rasburicase  sulfasalazine This list may not describe all possible interactions. Give your health care provider a list of all the medicines, herbs, non-prescription drugs, or dietary supplements you use. Also tell them if you smoke, drink alcohol, or use illegal drugs. Some items may interact with your medicine. What should I watch for while using this medicine? Your condition will be monitored carefully while you are receiving this medicine. Be careful to avoid injury while the area is numb, and you are not aware of pain. What side effects may I notice from receiving this medicine? Side effects that you should report to your doctor  or health care professional as soon as possible:  allergic reactions like skin rash, itching or hives, swelling of the face, lips, or tongue  seizures  signs and symptoms of a dangerous change in heartbeat or heart rhythm like chest pain; dizziness; fast, irregular heartbeat; palpitations; feeling faint or lightheaded; falls; breathing problems  signs and symptoms of methemoglobinemia such as pale, gray, or blue colored skin; headache; fast heartbeat; shortness of breath; feeling faint or lightheaded, falls; tiredness Side effects that usually do not require medical attention (report to your doctor or health care professional if they continue or are  bothersome):  anxious  back pain  changes in taste  changes in vision  constipation  dizziness  fever  nausea, vomiting This list may not describe all possible side effects. Call your doctor for medical advice about side effects. You may report side effects to FDA at 1-800-FDA-1088. Where should I keep my medicine? This drug is given in a hospital or clinic and will not be stored at home. NOTE: This sheet is a summary. It may not cover all possible information. If you have questions about this medicine, talk to your doctor, pharmacist, or health care provider.  2021 Elsevier/Gold Standard (2020-02-08 12:24:57)       General Anesthesia, Adult, Care After This sheet gives you information about how to care for yourself after your procedure. Your health care provider may also give you more specific instructions. If you have problems or questions, contact your health care provider. What can I expect after the procedure? After the procedure, the following side effects are common:  Pain or discomfort at the IV site.  Nausea.  Vomiting.  Sore throat.  Trouble concentrating.  Feeling cold or chills.  Feeling weak or tired.  Sleepiness and fatigue.  Soreness and body aches. These side effects can affect parts of the body that were not involved in surgery. Follow these instructions at home: For the time period you were told by your health care provider:  Rest.  Do not participate in activities where you could fall or become injured.  Do not drive or use machinery.  Do not drink alcohol.  Do not take sleeping pills or medicines that cause drowsiness.  Do not make important decisions or sign legal documents.  Do not take care of children on your own.   Eating and drinking  Follow any instructions from your health care provider about eating or drinking restrictions.  When you feel hungry, start by eating small amounts of foods that are soft and easy to digest  (bland), such as toast. Gradually return to your regular diet.  Drink enough fluid to keep your urine pale yellow.  If you vomit, rehydrate by drinking water, juice, or clear broth. General instructions  If you have sleep apnea, surgery and certain medicines can increase your risk for breathing problems. Follow instructions from your health care provider about wearing your sleep device: ? Anytime you are sleeping, including during daytime naps. ? While taking prescription pain medicines, sleeping medicines, or medicines that make you drowsy.  Have a responsible adult stay with you for the time you are told. It is important to have someone help care for you until you are awake and alert.  Return to your normal activities as told by your health care provider. Ask your health care provider what activities are safe for you.  Take over-the-counter and prescription medicines only as told by your health care provider.  If you smoke, do not smoke  without supervision.  Keep all follow-up visits as told by your health care provider. This is important. Contact a health care provider if:  You have nausea or vomiting that does not get better with medicine.  You cannot eat or drink without vomiting.  You have pain that does not get better with medicine.  You are unable to pass urine.  You develop a skin rash.  You have a fever.  You have redness around your IV site that gets worse. Get help right away if:  You have difficulty breathing.  You have chest pain.  You have blood in your urine or stool, or you vomit blood. Summary  After the procedure, it is common to have a sore throat or nausea. It is also common to feel tired.  Have a responsible adult stay with you for the time you are told. It is important to have someone help care for you until you are awake and alert.  When you feel hungry, start by eating small amounts of foods that are soft and easy to digest (bland), such as  toast. Gradually return to your regular diet.  Drink enough fluid to keep your urine pale yellow.  Return to your normal activities as told by your health care provider. Ask your health care provider what activities are safe for you. This information is not intended to replace advice given to you by your health care provider. Make sure you discuss any questions you have with your health care provider. Document Revised: 07/18/2020 Document Reviewed: 02/15/2020 Elsevier Patient Education  2021 [employer].

## 2021-03-19 NOTE — Anesthesia Procedure Notes (Addendum)
Procedure Name: Intubation Date/Time: 03/19/2021 12:21 PM Performed by: Hewitt Blade, CRNA Pre-anesthesia Checklist: Patient identified, Emergency Drugs available, Suction available and Patient being monitored Patient Re-evaluated:Patient Re-evaluated prior to induction Oxygen Delivery Method: Circle system utilized Preoxygenation: Pre-oxygenation with 100% oxygen Induction Type: IV induction Ventilation: Mask ventilation without difficulty Laryngoscope Size: Mac and 3 Grade View: Grade I Tube type: Oral Tube size: 7.0 mm Number of attempts: 1 Airway Equipment and Method: Stylet Placement Confirmation: ETT inserted through vocal cords under direct vision,  positive ETCO2 and breath sounds checked- equal and bilateral Secured at: 21 cm Tube secured with: Tape Dental Injury: Teeth and Oropharynx as per pre-operative assessment
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
## 2021-03-19 NOTE — Op Note (Signed)
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  Postop diagnosis: Same as above  Procedure: Laparoscopic right salpingo-oophorectomy for removal of benign cystic teratoma  Surgeon:  Florian Buff, MD   Anesthesia: General endotracheal  Findings:   Patient had normal intraperitoneal findings except for the 10cm right ovarian cystic teratoma The ovary was normal peritoneal cavity was otherwise normal uterus was normal  Description of operation: Patient was taken to the operating room placed in the supine position where she underwent general endotracheal anesthesia Was placed in low lithotomy position She was prepped and draped in the usual sterile fashion Incision was made in the umbilicus The umbilical fascia was grasped with a Coker clamp A Veres needle was placed into the peritoneal cavity with 1 pass light difficulty Peritoneal cavity was insufflated An 11 mm nonbladed direct video laparoscope trocar was used and placed into the peritoneal cavity and 1 pass under direct visualization out difficulty The peritoneal cavity was confirmed 5 mm trochars were placed in the right and left lower quadrants under direct visualization without difficulty The pathology of the right ovary was identified Interestingly the fallopian tube had a full twist and The ovarian pedicle was not twisted but the tube was Harmonic scalpel was used and the infundibulopelvic ligament was transected hemostatically The utero-ovarian ligament was then transected hemostatically The fallopian tube and ovary were then removed from its peritoneal attachment with the harmonic scalpel without difficulty A small defect was created in the inferior portion of the benign cystic teratoma and the suction irrigation system was placed in the cyst A large amount of sebum was removed A 5 mm video laparoscope was placed in the left lower quadrant Endo Catch bag was used and placed into the peritoneal cavity  and the mass was placed in the bag I then brought the mass out through the umbilical incision I did have to widen it laterally in order to remove it I then closed the umbilical fascia with 0 Vicryl and a urological needle with good resultant anatomical closure I then used the right and left lower quadrant ports to irrigate and ensure hemostasis Because of this there was some extravasation of the CO2 gas into the subcu and his tissue especially on the right there was subcutaneous crepitance relatively localized Umbilical subcutaneous tissue was reapproximated loosely I left 1000 cc of saline in the peritoneal cavity to decrease the chance of postoperative adhesion formation Patient did have some adhesions of the lower uterine segment to the anterior abdominal wall from her previous C-sections As result of leaving the fluid in place I decided to close the incisions with staples there would be watertight and Whitten prematurely dissolve the skin closure suture 266 mg of Exparel was injected in the 3 incision sites Patient tolerated procedure well she experienced minimal blood loss he was taken covering good stable condition all counts correct x3 He received Ancef and Toradol preoperatively prophylactically Be seen back in the office next week for incision check postoperative exam  Florian Buff, MD 03/19/2021 2:02 PM

## 2021-03-19 NOTE — Transfer of Care (Signed)
Immediate Anesthesia Transfer of Care Note  Patient: Mallory Mendoza  Procedure(s) Performed: LAPAROSCOPIC RIGHT SALPINGO OOPHORECTOMY (Right )  Patient Location: PACU  Anesthesia Type:General  Level of Consciousness: drowsy  Airway & Oxygen Therapy: Patient Spontanous Breathing and Patient connected to nasal cannula oxygen  Post-op Assessment: Report given to RN and Post -op Vital signs reviewed and stable  Post vital signs: Reviewed and stable  Last Vitals:  Vitals Value Taken Time  BP 99/68   Temp    Pulse 84   Resp 20   SpO2 97     Last Pain:  Vitals:   03/19/21 1000  TempSrc: Oral  PainSc: 4       Patients Stated Pain Goal: 5 (21/22/48 2500)  Complications: No complications documented.

## 2021-03-20 ENCOUNTER — Encounter (HOSPITAL_COMMUNITY): Payer: Self-pay | Admitting: Obstetrics & Gynecology

## 2021-03-21 LAB — SURGICAL PATHOLOGY

## 2021-03-28 ENCOUNTER — Ambulatory Visit (INDEPENDENT_AMBULATORY_CARE_PROVIDER_SITE_OTHER): Payer: Medicaid Other | Admitting: Obstetrics & Gynecology

## 2021-03-28 ENCOUNTER — Encounter: Payer: Self-pay | Admitting: Obstetrics & Gynecology

## 2021-03-28 ENCOUNTER — Other Ambulatory Visit: Payer: Self-pay

## 2021-03-28 VITALS — BP 122/84 | HR 106 | Ht 64.0 in | Wt 135.5 lb

## 2021-03-28 DIAGNOSIS — Z9889 Other specified postprocedural states: Secondary | ICD-10-CM

## 2021-03-28 NOTE — Progress Notes (Signed)
  HPI: Patient returns for routine postoperative follow-up having undergone laparoscopic RSO on 03/19/21.  The patient's immediate postoperative recovery has been unremarkable. Since hospital discharge the patient reports no problems.   Current Outpatient Medications: ibuprofen (ADVIL) 600 MG tablet, Take 600 mg by mouth every 6 (six) hours as needed for moderate pain., Disp: , Rfl:  ketorolac (TORADOL) 10 MG tablet, Take 1 tablet (10 mg total) by mouth every 8 (eight) hours as needed., Disp: 15 tablet, Rfl: 0 ondansetron (ZOFRAN ODT) 8 MG disintegrating tablet, Take 1 tablet (8 mg total) by mouth every 8 (eight) hours as needed for nausea or vomiting., Disp: 20 tablet, Rfl: 0 oxyCODONE (ROXICODONE) 5 MG immediate release tablet, Take 1 tablet (5 mg total) by mouth every 4 (four) hours as needed for severe pain., Disp: 30 tablet, Rfl: 0  No current facility-administered medications for this visit.    Blood pressure 122/84, pulse (!) 106, height 5\' 4"  (1.626 m), weight 135 lb 8 oz (61.5 kg), last menstrual period 03/21/2021.  Physical Exam: 3 incisions clean dry intact Significant abdominal wall bruising Abdomen is benign  Diagnostic Tests:   Pathology: Benign cystic teratoma  Impression: 1. S/P laparoscopic RSO for 11 cm benign cystic teratoma, 03/19/21      Plan: Routine post op care    Follow up: prn   Florian Buff, MD

## 2021-04-02 ENCOUNTER — Other Ambulatory Visit: Payer: Self-pay

## 2021-04-02 ENCOUNTER — Emergency Department (HOSPITAL_COMMUNITY)
Admission: EM | Admit: 2021-04-02 | Discharge: 2021-04-02 | Disposition: A | Discharge status: Home / Self Care | Payer: Medicaid Other | Attending: Emergency Medicine | Admitting: Emergency Medicine

## 2021-04-02 ENCOUNTER — Encounter (HOSPITAL_COMMUNITY): Payer: Self-pay | Admitting: Emergency Medicine

## 2021-04-02 DIAGNOSIS — F17210 Nicotine dependence, cigarettes, uncomplicated: Secondary | ICD-10-CM | POA: Diagnosis not present

## 2021-04-02 DIAGNOSIS — L723 Sebaceous cyst: Secondary | ICD-10-CM | POA: Insufficient documentation

## 2021-04-02 DIAGNOSIS — H9202 Otalgia, left ear: Secondary | ICD-10-CM | POA: Diagnosis present

## 2021-04-02 MED ORDER — HYDROCODONE-ACETAMINOPHEN 5-325 MG PO TABS
1.0000 | ORAL_TABLET | Freq: Once | ORAL | Status: AC
Start: 2021-04-02 — End: 2021-04-02
  Administered 2021-04-02: 1 via ORAL
  Filled 2021-04-02: qty 1

## 2021-04-02 MED ORDER — LIDOCAINE-EPINEPHRINE (PF) 2 %-1:200000 IJ SOLN
20.0000 mL | Freq: Once | INTRAMUSCULAR | Status: AC
  Administered 2021-04-02: 20 mL
  Filled 2021-04-02: qty 20
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
## 2021-04-02 NOTE — Discharge Instructions (Addendum)
You were seen today and you likely have a sebaceous cyst.  This was drained.  This will recur if the casing is not removed.  Follow-up with ENT or dermatology for assessment.

## 2021-04-02 NOTE — ED Triage Notes (Signed)
Pt c/o left ear pain with abscess 3-4 days.

## 2021-04-03 ENCOUNTER — Telehealth: Payer: Self-pay | Admitting: *Deleted

## 2021-04-03 NOTE — Telephone Encounter (Signed)
Transition Care Management Follow-up Telephone Call  Date of discharge and from where: 04/02/2021 - [facility]  How have you been since you were released from the hospital? "A lot better"  Any questions or concerns? No  Items Reviewed:  Did the pt receive and understand the discharge instructions provided? Yes   Medications obtained and verified? Yes   Other? No   Any new allergies since your discharge? No   Dietary orders reviewed? No  Do you have support at home? Yes    Functional Questionnaire: (I = Independent and D = Dependent) ADLs: I  Bathing/Dressing- I  Meal Prep- I  Eating- I  Maintaining continence- I  Transferring/Ambulation- I  Managing Meds- I  Follow up appointments reviewed:   PCP Hospital f/u appt confirmed? No    Specialist Hospital f/u appt confirmed? No    Are transportation arrangements needed? N/A  If their condition worsens, is the pt aware to call PCP or go to the Emergency Dept.? Yes  Was the patient provided with contact information for the PCP's office or ED? Yes  Was to pt encouraged to call back with questions or concerns? Yes

## 2021-06-18 ENCOUNTER — Other Ambulatory Visit: Payer: Self-pay

## 2021-06-18 ENCOUNTER — Encounter (HOSPITAL_COMMUNITY): Payer: Self-pay | Admitting: *Deleted

## 2021-06-18 ENCOUNTER — Emergency Department (HOSPITAL_COMMUNITY)
Admission: EM | Admit: 2021-06-18 | Discharge: 2021-06-18 | Disposition: A | Discharge status: Home / Self Care | Payer: Medicaid Other | Attending: Emergency Medicine | Admitting: Emergency Medicine

## 2021-06-18 DIAGNOSIS — Z5321 Procedure and treatment not carried out due to patient leaving prior to being seen by health care provider: Secondary | ICD-10-CM | POA: Insufficient documentation

## 2021-06-18 DIAGNOSIS — M549 Dorsalgia, unspecified: Secondary | ICD-10-CM | POA: Diagnosis not present

## 2021-06-18 DIAGNOSIS — R197 Diarrhea, unspecified: Secondary | ICD-10-CM | POA: Insufficient documentation

## 2021-06-18 DIAGNOSIS — R519 Headache, unspecified: Secondary | ICD-10-CM | POA: Diagnosis not present

## 2021-06-18 DIAGNOSIS — R0989 Other specified symptoms and signs involving the circulatory and respiratory systems: Secondary | ICD-10-CM | POA: Diagnosis not present

## 2021-06-18 DIAGNOSIS — R059 Cough, unspecified: Secondary | ICD-10-CM | POA: Diagnosis not present

## 2021-06-18 DIAGNOSIS — R110 Nausea: Secondary | ICD-10-CM | POA: Diagnosis not present

## 2021-06-18 NOTE — ED Triage Notes (Signed)
Pt with cough, runny nose, HA, nausea, diarrhea and back pain since yesterday.  No known Covid exposure.

## 2021-06-19 ENCOUNTER — Encounter (HOSPITAL_COMMUNITY): Payer: Self-pay | Admitting: Emergency Medicine

## 2021-06-19 ENCOUNTER — Emergency Department (HOSPITAL_COMMUNITY)
Admission: EM | Admit: 2021-06-19 | Discharge: 2021-06-19 | Disposition: A | Discharge status: Home / Self Care | Payer: Medicaid Other | Attending: Emergency Medicine | Admitting: Emergency Medicine

## 2021-06-19 ENCOUNTER — Other Ambulatory Visit: Payer: Self-pay

## 2021-06-19 DIAGNOSIS — E876 Hypokalemia: Secondary | ICD-10-CM | POA: Diagnosis not present

## 2021-06-19 DIAGNOSIS — D696 Thrombocytopenia, unspecified: Secondary | ICD-10-CM | POA: Diagnosis not present

## 2021-06-19 DIAGNOSIS — U071 COVID-19: Secondary | ICD-10-CM | POA: Insufficient documentation

## 2021-06-19 DIAGNOSIS — F17210 Nicotine dependence, cigarettes, uncomplicated: Secondary | ICD-10-CM | POA: Diagnosis not present

## 2021-06-19 DIAGNOSIS — Z28310 Unvaccinated for covid-19: Secondary | ICD-10-CM | POA: Insufficient documentation

## 2021-06-19 DIAGNOSIS — R059 Cough, unspecified: Secondary | ICD-10-CM | POA: Diagnosis present

## 2021-06-19 LAB — BASIC METABOLIC PANEL
Anion gap: 9 (ref 5–15)
BUN: <5 mg/dL — ABNORMAL LOW (ref 6–20)
CO2: 28 mmol/L (ref 22–32)
Calcium: 8.6 mg/dL — ABNORMAL LOW (ref 8.9–10.3)
Chloride: 98 mmol/L (ref 98–111)
Creatinine, Ser: 0.7 mg/dL (ref 0.44–1.00)
GFR, Estimated: >60 mL/min (ref >60)
Glucose, Bld: 90 mg/dL (ref 70–99)
Potassium: 3 mmol/L — ABNORMAL LOW (ref 3.5–5.1)
Sodium: 135 mmol/L (ref 135–145)

## 2021-06-19 LAB — CBC
HCT: 43.3 % (ref 36.0–46.0)
Hemoglobin: 14.2 g/dL (ref 12.0–15.0)
MCH: 33.6 pg (ref 26.0–34.0)
MCHC: 32.8 g/dL (ref 30.0–36.0)
MCV: 102.4 fL — ABNORMAL HIGH (ref 80.0–100.0)
Platelets: 112 10*3/uL — ABNORMAL LOW (ref 150–400)
RBC: 4.23 MIL/uL (ref 3.87–5.11)
RDW: 17.7 % — ABNORMAL HIGH (ref 11.5–15.5)
WBC: 2.1 10*3/uL — ABNORMAL LOW (ref 4.0–10.5)
nRBC: 0 % (ref 0.0–0.2)

## 2021-06-19 MED ORDER — ACETAMINOPHEN 500 MG PO TABS
1000.0000 mg | ORAL_TABLET | Freq: Once | ORAL | Status: AC
  Administered 2021-06-19: 1000 mg via ORAL
  Filled 2021-06-19: qty 2

## 2021-06-19 MED ORDER — POTASSIUM CHLORIDE CRYS ER 20 MEQ PO TBCR
40.0000 meq | EXTENDED_RELEASE_TABLET | Freq: Once | ORAL | Status: AC
  Administered 2021-06-19: 40 meq via ORAL
  Filled 2021-06-19: qty 2

## 2021-06-19 MED ORDER — ONDANSETRON 8 MG PO TBDP
8.0000 mg | ORAL_TABLET | Freq: Once | ORAL | Status: AC
  Administered 2021-06-19: 8 mg via ORAL
  Filled 2021-06-19: qty 1

## 2021-06-19 MED ORDER — PAXLOVID 10 X 150 MG & 10 X 100MG PO TBPK: 2.0000 | ORAL_TABLET | Freq: Two times a day (BID) | ORAL | 0 refills | Status: AC

## 2021-06-19 MED ORDER — PAXLOVID 10 X 150 MG & 10 X 100MG PO TBPK: 2.0000 | ORAL_TABLET | Freq: Two times a day (BID) | ORAL | 0 refills | Status: DC

## 2021-06-19 MED ORDER — POTASSIUM CHLORIDE CRYS ER 20 MEQ PO TBCR: EXTENDED_RELEASE_TABLET | ORAL | 0 refills | Status: DC

## 2021-06-19 NOTE — ED Triage Notes (Signed)
Pt to the ED after a positive covid test at home. Pt has a cough and N/V/D
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
## 2021-06-19 NOTE — ED Notes (Signed)
Patient able to tolerate 8 ounces of water

## 2021-06-19 NOTE — Discharge Instructions (Addendum)
It was our pleasure to provide your ER care today - we hope that you feel better.  Drink plenty of fluids/stay well hydrated. Stay active/take full and deep breaths.   Take paxlovid as prescribed. Take acetaminophen or ibuprofen as need. Avoid smoking and/or alcohol use.   From your lab tests, your potassium level is low (3) - eat plenty of fruits and vegetables, take potassium supplement as prescribed, and follow up with primary care doctor in 1-2 weeks.   Return to ER if worse, new symptoms, increased trouble breathing, or other concern.

## 2021-06-20 ENCOUNTER — Telehealth: Payer: Self-pay

## 2021-06-20 NOTE — Telephone Encounter (Signed)
Transition Care Management Unsuccessful Follow-up Telephone Call  Date of discharge and from where:  06/19/2021[facility]   Attempts:  1st Attempt  Reason for unsuccessful TCM follow-up call:  Left voice message

## 2021-06-23 NOTE — Telephone Encounter (Signed)
Transition Care Management Follow-up Telephone Call Date of discharge and from where: 06/19/2021-[place] [facility]  How have you been since you were released from the hospital? Patient stated she is doing fine.  Any questions or concerns? No  Items Reviewed: Did the pt receive and understand the discharge instructions provided? Yes  Medications obtained and verified? Yes  Other? No  Any new allergies since your discharge? No  Dietary orders reviewed? N/A Do you have support at home? Yes   Home Care and Equipment/Supplies: Were home health services ordered? not applicable If so, what is the name of the agency? N/A  Has the agency set up a time to come to the patient's home? not applicable Were any new equipment or medical supplies ordered?  No What is the name of the medical supply agency? N/A Were you able to get the supplies/equipment? not applicable Do you have any questions related to the use of the equipment or supplies? No  Functional Questionnaire: (I = Independent and D = Dependent) ADLs: I  Bathing/Dressing- I  Meal Prep- I  Eating- I  Maintaining continence- I  Transferring/Ambulation- I  Managing Meds- I  Follow up appointments reviewed:  PCP Hospital f/u appt confirmed? No   Specialist Hospital f/u appt confirmed? No   Are transportation arrangements needed? No  If their condition worsens, is the pt aware to call PCP or go to the Emergency Dept.? Yes Was the patient provided with contact information for the PCP's office or ED? Yes Was to pt encouraged to call back with questions or concerns? Yes

## 2021-09-01 ENCOUNTER — Emergency Department (HOSPITAL_COMMUNITY)
Admission: EM | Admit: 2021-09-01 | Discharge: 2021-09-01 | Discharge status: Against Medical Advice | Payer: Medicaid Other

## 2021-09-16 ENCOUNTER — Emergency Department (HOSPITAL_COMMUNITY)
Admission: EM | Admit: 2021-09-16 | Discharge: 2021-09-16 | Disposition: A | Discharge status: Home / Self Care | Payer: Medicaid Other | Attending: Emergency Medicine | Admitting: Emergency Medicine

## 2021-09-16 ENCOUNTER — Encounter (HOSPITAL_COMMUNITY): Payer: Self-pay | Admitting: Emergency Medicine

## 2021-09-16 ENCOUNTER — Other Ambulatory Visit: Payer: Self-pay

## 2021-09-16 DIAGNOSIS — L02412 Cutaneous abscess of left axilla: Secondary | ICD-10-CM | POA: Diagnosis not present

## 2021-09-16 DIAGNOSIS — L0291 Cutaneous abscess, unspecified: Secondary | ICD-10-CM

## 2021-09-16 DIAGNOSIS — F17210 Nicotine dependence, cigarettes, uncomplicated: Secondary | ICD-10-CM | POA: Diagnosis not present

## 2021-09-16 DIAGNOSIS — L02414 Cutaneous abscess of left upper limb: Secondary | ICD-10-CM | POA: Diagnosis present

## 2021-09-16 MED ORDER — DOXYCYCLINE HYCLATE 100 MG PO CAPS: 100.0000 mg | ORAL_CAPSULE | Freq: Two times a day (BID) | ORAL | 0 refills | Status: DC

## 2021-09-16 MED ORDER — POVIDONE-IODINE 10 % EX SOLN
CUTANEOUS | Status: AC
  Filled 2021-09-16: qty 15

## 2021-09-16 MED ORDER — IBUPROFEN 600 MG PO TABS: 600.0000 mg | ORAL_TABLET | Freq: Four times a day (QID) | ORAL | 0 refills | Status: DC | PRN

## 2021-09-16 NOTE — ED Triage Notes (Signed)
History of cyst.
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
## 2021-09-16 NOTE — Discharge Instructions (Addendum)
Your abscess was drained today.  A large amount of material was removed however there are likely still some pockets of pus.  I have sent 10 days worth of antibiotics to the pharmacy for you to utilize.  Please keep your area clean and dry.  You may change the packing over the next couple of days, and please go to an urgent care to recheck the area in 3 days.  You may also come to the emergency department for this recheck if you are unable to get into urgent care.  Please return to the emergency department sooner if you have fevers, chills or an overall worsening of your condition.    I have attached information about the condition that we discussed that often causes multiple abscesses.  Read this at your leisure.  It was a pleasure to meet you today and I hope that you feel better.

## 2021-09-17 ENCOUNTER — Telehealth: Payer: Self-pay

## 2021-09-17 NOTE — Telephone Encounter (Signed)
Transition Care Management Follow-up Telephone Call Date of discharge and from where: 09/16/2021-[place] How have you been since you were released from the hospital? Patient stated she is doing fine.  Any questions or concerns? No  Items Reviewed: Did the pt receive and understand the discharge instructions provided? Yes  Medications obtained and verified? Yes  Other? No  Any new allergies since your discharge? No  Dietary orders reviewed? No Do you have support at home? Yes   Home Care and Equipment/Supplies: Were home health services ordered? not applicable If so, what is the name of the agency? N/A  Has the agency set up a time to come to the patient's home? not applicable Were any new equipment or medical supplies ordered?  No What is the name of the medical supply agency? N/A Were you able to get the supplies/equipment? not applicable Do you have any questions related to the use of the equipment or supplies? No  Functional Questionnaire: (I = Independent and D = Dependent) ADLs: I  Bathing/Dressing- I  Meal Prep- I  Eating- I  Maintaining continence- I  Transferring/Ambulation- I  Managing Meds- I  Follow up appointments reviewed:  PCP Hospital f/u appt confirmed? No   Specialist Hospital f/u appt confirmed? No   Are transportation arrangements needed? No  If their condition worsens, is the pt aware to call PCP or go to the Emergency Dept.? Yes Was the patient provided with contact information for the PCP's office or ED? Yes Was to pt encouraged to call back with questions or concerns? Yes

## 2021-11-13 ENCOUNTER — Inpatient Hospital Stay (HOSPITAL_COMMUNITY)
Admission: EM | Admit: 2021-11-13 | Discharge: 2021-11-15 | DRG: 440 | Disposition: A | Discharge status: Home / Self Care | Payer: Medicaid Other | Attending: Family Medicine | Admitting: Family Medicine

## 2021-11-13 ENCOUNTER — Emergency Department (HOSPITAL_COMMUNITY): Payer: Medicaid Other

## 2021-11-13 ENCOUNTER — Encounter (HOSPITAL_COMMUNITY): Payer: Self-pay

## 2021-11-13 ENCOUNTER — Other Ambulatory Visit: Payer: Self-pay

## 2021-11-13 DIAGNOSIS — F1010 Alcohol abuse, uncomplicated: Secondary | ICD-10-CM | POA: Diagnosis present

## 2021-11-13 DIAGNOSIS — R509 Fever, unspecified: Secondary | ICD-10-CM | POA: Diagnosis not present

## 2021-11-13 DIAGNOSIS — R1110 Vomiting, unspecified: Secondary | ICD-10-CM | POA: Diagnosis not present

## 2021-11-13 DIAGNOSIS — R109 Unspecified abdominal pain: Secondary | ICD-10-CM | POA: Diagnosis not present

## 2021-11-13 DIAGNOSIS — K859 Acute pancreatitis without necrosis or infection, unspecified: Secondary | ICD-10-CM | POA: Diagnosis present

## 2021-11-13 DIAGNOSIS — F419 Anxiety disorder, unspecified: Secondary | ICD-10-CM | POA: Diagnosis present

## 2021-11-13 DIAGNOSIS — Z20822 Contact with and (suspected) exposure to covid-19: Secondary | ICD-10-CM | POA: Diagnosis present

## 2021-11-13 DIAGNOSIS — F32A Depression, unspecified: Secondary | ICD-10-CM | POA: Diagnosis present

## 2021-11-13 DIAGNOSIS — F17210 Nicotine dependence, cigarettes, uncomplicated: Secondary | ICD-10-CM | POA: Diagnosis present

## 2021-11-13 DIAGNOSIS — R059 Cough, unspecified: Secondary | ICD-10-CM | POA: Diagnosis not present

## 2021-11-13 DIAGNOSIS — E860 Dehydration: Secondary | ICD-10-CM | POA: Diagnosis present

## 2021-11-13 DIAGNOSIS — K852 Alcohol induced acute pancreatitis without necrosis or infection: Secondary | ICD-10-CM | POA: Diagnosis not present

## 2021-11-13 DIAGNOSIS — Z90721 Acquired absence of ovaries, unilateral: Secondary | ICD-10-CM

## 2021-11-13 DIAGNOSIS — Z28310 Unvaccinated for covid-19: Secondary | ICD-10-CM

## 2021-11-13 DIAGNOSIS — Z8249 Family history of ischemic heart disease and other diseases of the circulatory system: Secondary | ICD-10-CM

## 2021-11-13 DIAGNOSIS — E876 Hypokalemia: Secondary | ICD-10-CM | POA: Diagnosis present

## 2021-11-13 DIAGNOSIS — K2920 Alcoholic gastritis without bleeding: Secondary | ICD-10-CM | POA: Diagnosis present

## 2021-11-13 LAB — COMPREHENSIVE METABOLIC PANEL
ALT: 26 U/L (ref 0–44)
AST: 46 U/L — ABNORMAL HIGH (ref 15–41)
Albumin: 5.3 g/dL — ABNORMAL HIGH (ref 3.5–5.0)
Alkaline Phosphatase: 92 U/L (ref 38–126)
Anion gap: 24 — ABNORMAL HIGH (ref 5–15)
BUN: 13 mg/dL (ref 6–20)
CO2: 20 mmol/L — ABNORMAL LOW (ref 22–32)
Calcium: 10.4 mg/dL — ABNORMAL HIGH (ref 8.9–10.3)
Chloride: 92 mmol/L — ABNORMAL LOW (ref 98–111)
Creatinine, Ser: 0.97 mg/dL (ref 0.44–1.00)
GFR, Estimated: >60 mL/min (ref >60)
Glucose, Bld: 140 mg/dL — ABNORMAL HIGH (ref 70–99)
Potassium: 3.3 mmol/L — ABNORMAL LOW (ref 3.5–5.1)
Sodium: 136 mmol/L (ref 135–145)
Total Bilirubin: 3.2 mg/dL — ABNORMAL HIGH (ref 0.3–1.2)
Total Protein: 9.1 g/dL — ABNORMAL HIGH (ref 6.5–8.1)

## 2021-11-13 LAB — URINALYSIS, ROUTINE W REFLEX MICROSCOPIC
Glucose, UA: 50 mg/dL — AB
Ketones, ur: 80 mg/dL — AB
Nitrite: NEGATIVE
Protein, ur: >=300 mg/dL — AB
RBC / HPF: >50 RBC/hpf — ABNORMAL HIGH (ref 0–5)
Specific Gravity, Urine: 1.028 (ref 1.005–1.030)
pH: 5 (ref 5.0–8.0)

## 2021-11-13 LAB — CBC WITH DIFFERENTIAL/PLATELET
Abs Immature Granulocytes: 0.01 10*3/uL (ref 0.00–0.07)
Basophils Absolute: 0 10*3/uL (ref 0.0–0.1)
Basophils Relative: 0 %
Eosinophils Absolute: 0 10*3/uL (ref 0.0–0.5)
Eosinophils Relative: 0 %
HCT: 39 % (ref 36.0–46.0)
Hemoglobin: 13.2 g/dL (ref 12.0–15.0)
Immature Granulocytes: 0 %
Lymphocytes Relative: 14 %
Lymphs Abs: 0.7 10*3/uL (ref 0.7–4.0)
MCH: 35.4 pg — ABNORMAL HIGH (ref 26.0–34.0)
MCHC: 33.8 g/dL (ref 30.0–36.0)
MCV: 104.6 fL — ABNORMAL HIGH (ref 80.0–100.0)
Monocytes Absolute: 0.4 10*3/uL (ref 0.1–1.0)
Monocytes Relative: 9 %
Neutro Abs: 3.6 10*3/uL (ref 1.7–7.7)
Neutrophils Relative %: 77 %
Platelets: 106 10*3/uL — ABNORMAL LOW (ref 150–400)
RBC: 3.73 MIL/uL — ABNORMAL LOW (ref 3.87–5.11)
RDW: 19.9 % — ABNORMAL HIGH (ref 11.5–15.5)
WBC: 4.8 10*3/uL (ref 4.0–10.5)
nRBC: 0 % (ref 0.0–0.2)

## 2021-11-13 LAB — RAPID URINE DRUG SCREEN, HOSP PERFORMED
Amphetamines: NOT DETECTED
Barbiturates: NOT DETECTED
Benzodiazepines: NOT DETECTED
Cocaine: NOT DETECTED
Opiates: POSITIVE — AB
Tetrahydrocannabinol: NOT DETECTED

## 2021-11-13 LAB — RESP PANEL BY RT-PCR (FLU A&B, COVID) ARPGX2
Influenza A by PCR: NEGATIVE
Influenza B by PCR: NEGATIVE
SARS Coronavirus 2 by RT PCR: NEGATIVE

## 2021-11-13 LAB — POC URINE PREG, ED: Preg Test, Ur: NEGATIVE

## 2021-11-13 LAB — MAGNESIUM: Magnesium: 1.8 mg/dL (ref 1.7–2.4)

## 2021-11-13 LAB — LIPASE, BLOOD: Lipase: 573 U/L — ABNORMAL HIGH (ref 11–51)

## 2021-11-13 LAB — ETHANOL: Alcohol, Ethyl (B): <10 mg/dL (ref <10)

## 2021-11-13 MED ORDER — ONDANSETRON HCL 4 MG/2ML IJ SOLN
4.0000 mg | Freq: Once | INTRAMUSCULAR | Status: AC
  Administered 2021-11-13: 10:00:00 4 mg via INTRAVENOUS
  Filled 2021-11-13: qty 2

## 2021-11-13 MED ORDER — IOHEXOL 300 MG/ML  SOLN
80.0000 mL | Freq: Once | INTRAMUSCULAR | Status: AC | PRN
  Administered 2021-11-13: 12:00:00 80 mL via INTRAVENOUS

## 2021-11-13 MED ORDER — ENOXAPARIN SODIUM 40 MG/0.4ML IJ SOSY
40.0000 mg | PREFILLED_SYRINGE | INTRAMUSCULAR | Status: DC
  Administered 2021-11-13 – 2021-11-14 (×2): 40 mg via SUBCUTANEOUS
  Filled 2021-11-13 (×2): qty 0.4

## 2021-11-13 MED ORDER — ACETAMINOPHEN 650 MG RE SUPP: 650.0000 mg | Freq: Four times a day (QID) | RECTAL | Status: DC | PRN

## 2021-11-13 MED ORDER — ACETAMINOPHEN 325 MG PO TABS: 650.0000 mg | ORAL_TABLET | Freq: Four times a day (QID) | ORAL | Status: DC | PRN

## 2021-11-13 MED ORDER — KCL IN DEXTROSE-NACL 20-5-0.9 MEQ/L-%-% IV SOLN: INTRAVENOUS | Status: DC

## 2021-11-13 MED ORDER — ADULT MULTIVITAMIN W/MINERALS CH
1.0000 | ORAL_TABLET | Freq: Every day | ORAL | Status: DC
  Administered 2021-11-13 – 2021-11-15 (×3): 1 via ORAL
  Filled 2021-11-13 (×3): qty 1

## 2021-11-13 MED ORDER — SODIUM CHLORIDE 0.9 % IV BOLUS
1000.0000 mL | Freq: Once | INTRAVENOUS | Status: AC
  Administered 2021-11-13: 10:00:00 1000 mL via INTRAVENOUS

## 2021-11-13 MED ORDER — FOLIC ACID 1 MG PO TABS
1.0000 mg | ORAL_TABLET | Freq: Every day | ORAL | Status: DC
  Administered 2021-11-13 – 2021-11-15 (×3): 1 mg via ORAL
  Filled 2021-11-13 (×3): qty 1

## 2021-11-13 MED ORDER — SODIUM CHLORIDE 0.9 % IV BOLUS
1000.0000 mL | Freq: Once | INTRAVENOUS | Status: AC
  Administered 2021-11-13: 19:00:00 1000 mL via INTRAVENOUS

## 2021-11-13 MED ORDER — SODIUM CHLORIDE 0.9 % IV BOLUS
1000.0000 mL | Freq: Once | INTRAVENOUS | Status: AC
  Administered 2021-11-13: 12:00:00 1000 mL via INTRAVENOUS

## 2021-11-13 MED ORDER — LORAZEPAM 1 MG PO TABS: 1.0000 mg | ORAL_TABLET | ORAL | Status: DC | PRN

## 2021-11-13 MED ORDER — SODIUM CHLORIDE 0.9 % IV BOLUS
1000.0000 mL | Freq: Once | INTRAVENOUS | Status: AC
  Administered 2021-11-13: 14:00:00 1000 mL via INTRAVENOUS

## 2021-11-13 MED ORDER — ONDANSETRON HCL 4 MG PO TABS: 4.0000 mg | ORAL_TABLET | Freq: Four times a day (QID) | ORAL | Status: DC | PRN

## 2021-11-13 MED ORDER — THIAMINE HCL 100 MG PO TABS
100.0000 mg | ORAL_TABLET | Freq: Every day | ORAL | Status: DC
  Administered 2021-11-13 – 2021-11-15 (×3): 100 mg via ORAL
  Filled 2021-11-13 (×4): qty 1

## 2021-11-13 MED ORDER — ONDANSETRON HCL 4 MG/2ML IJ SOLN: 4.0000 mg | Freq: Four times a day (QID) | INTRAMUSCULAR | Status: DC | PRN

## 2021-11-13 MED ORDER — MORPHINE SULFATE (PF) 2 MG/ML IV SOLN
2.0000 mg | Freq: Once | INTRAVENOUS | Status: AC
  Administered 2021-11-13: 16:00:00 2 mg via INTRAVENOUS
  Filled 2021-11-13: qty 1

## 2021-11-13 MED ORDER — LORAZEPAM 2 MG/ML IJ SOLN: 1.0000 mg | INTRAMUSCULAR | Status: DC | PRN

## 2021-11-13 MED ORDER — THIAMINE HCL 100 MG/ML IJ SOLN
100.0000 mg | Freq: Every day | INTRAMUSCULAR | Status: DC
  Filled 2021-11-13: qty 2

## 2021-11-13 MED ORDER — POLYETHYLENE GLYCOL 3350 17 G PO PACK: 17.0000 g | PACK | Freq: Every day | ORAL | Status: DC | PRN

## 2021-11-13 MED ORDER — MORPHINE SULFATE (PF) 2 MG/ML IV SOLN
2.0000 mg | INTRAVENOUS | Status: DC | PRN
  Administered 2021-11-13 – 2021-11-14 (×3): 2 mg via INTRAVENOUS
  Filled 2021-11-13 (×3): qty 1

## 2021-11-13 NOTE — Plan of Care (Signed)
Pt arrived the 300 floor from ED at 1715. A&O x 4. No complaints at this time.

## 2021-11-13 NOTE — ED Triage Notes (Signed)
Complaints of abdominal pain with nausea and vomiting for 4 days. Unable to eat or drink.
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
## 2021-11-13 NOTE — ED Notes (Signed)
Attempted to call report
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
## 2021-11-14 DIAGNOSIS — F32A Depression, unspecified: Secondary | ICD-10-CM | POA: Diagnosis present

## 2021-11-14 DIAGNOSIS — R1110 Vomiting, unspecified: Secondary | ICD-10-CM | POA: Diagnosis not present

## 2021-11-14 DIAGNOSIS — Z8249 Family history of ischemic heart disease and other diseases of the circulatory system: Secondary | ICD-10-CM | POA: Diagnosis not present

## 2021-11-14 DIAGNOSIS — F17210 Nicotine dependence, cigarettes, uncomplicated: Secondary | ICD-10-CM | POA: Diagnosis present

## 2021-11-14 DIAGNOSIS — K2920 Alcoholic gastritis without bleeding: Secondary | ICD-10-CM | POA: Diagnosis present

## 2021-11-14 DIAGNOSIS — E876 Hypokalemia: Secondary | ICD-10-CM | POA: Diagnosis present

## 2021-11-14 DIAGNOSIS — Z28310 Unvaccinated for covid-19: Secondary | ICD-10-CM | POA: Diagnosis not present

## 2021-11-14 DIAGNOSIS — F1010 Alcohol abuse, uncomplicated: Secondary | ICD-10-CM | POA: Diagnosis present

## 2021-11-14 DIAGNOSIS — R109 Unspecified abdominal pain: Secondary | ICD-10-CM | POA: Diagnosis not present

## 2021-11-14 DIAGNOSIS — K859 Acute pancreatitis without necrosis or infection, unspecified: Secondary | ICD-10-CM | POA: Diagnosis present

## 2021-11-14 DIAGNOSIS — Z90721 Acquired absence of ovaries, unilateral: Secondary | ICD-10-CM | POA: Diagnosis not present

## 2021-11-14 DIAGNOSIS — R059 Cough, unspecified: Secondary | ICD-10-CM | POA: Diagnosis not present

## 2021-11-14 DIAGNOSIS — E860 Dehydration: Secondary | ICD-10-CM | POA: Diagnosis not present

## 2021-11-14 DIAGNOSIS — F419 Anxiety disorder, unspecified: Secondary | ICD-10-CM | POA: Diagnosis present

## 2021-11-14 DIAGNOSIS — K852 Alcohol induced acute pancreatitis without necrosis or infection: Secondary | ICD-10-CM | POA: Diagnosis not present

## 2021-11-14 DIAGNOSIS — Z20822 Contact with and (suspected) exposure to covid-19: Secondary | ICD-10-CM | POA: Diagnosis present

## 2021-11-14 DIAGNOSIS — R509 Fever, unspecified: Secondary | ICD-10-CM | POA: Diagnosis not present

## 2021-11-14 LAB — COMPREHENSIVE METABOLIC PANEL
ALT: 21 U/L (ref 0–44)
AST: 92 U/L — ABNORMAL HIGH (ref 15–41)
Albumin: 3.3 g/dL — ABNORMAL LOW (ref 3.5–5.0)
Alkaline Phosphatase: 60 U/L (ref 38–126)
Anion gap: 7 (ref 5–15)
BUN: 5 mg/dL — ABNORMAL LOW (ref 6–20)
CO2: 24 mmol/L (ref 22–32)
Calcium: 8.3 mg/dL — ABNORMAL LOW (ref 8.9–10.3)
Chloride: 102 mmol/L (ref 98–111)
Creatinine, Ser: 0.4 mg/dL — ABNORMAL LOW (ref 0.44–1.00)
GFR, Estimated: >60 mL/min (ref >60)
Glucose, Bld: 101 mg/dL — ABNORMAL HIGH (ref 70–99)
Potassium: 2.8 mmol/L — ABNORMAL LOW (ref 3.5–5.1)
Sodium: 133 mmol/L — ABNORMAL LOW (ref 135–145)
Total Bilirubin: 1.5 mg/dL — ABNORMAL HIGH (ref 0.3–1.2)
Total Protein: 5.7 g/dL — ABNORMAL LOW (ref 6.5–8.1)

## 2021-11-14 LAB — CBC
HCT: 29 % — ABNORMAL LOW (ref 36.0–46.0)
Hemoglobin: 9.6 g/dL — ABNORMAL LOW (ref 12.0–15.0)
MCH: 34.4 pg — ABNORMAL HIGH (ref 26.0–34.0)
MCHC: 33.1 g/dL (ref 30.0–36.0)
MCV: 103.9 fL — ABNORMAL HIGH (ref 80.0–100.0)
Platelets: 65 10*3/uL — ABNORMAL LOW (ref 150–400)
RBC: 2.79 MIL/uL — ABNORMAL LOW (ref 3.87–5.11)
RDW: 19.4 % — ABNORMAL HIGH (ref 11.5–15.5)
WBC: 3.6 10*3/uL — ABNORMAL LOW (ref 4.0–10.5)
nRBC: 0 % (ref 0.0–0.2)

## 2021-11-14 MED ORDER — DIAZEPAM 2 MG PO TABS
2.0000 mg | ORAL_TABLET | Freq: Three times a day (TID) | ORAL | Status: DC
  Administered 2021-11-14 – 2021-11-15 (×3): 2 mg via ORAL
  Filled 2021-11-14 (×3): qty 1

## 2021-11-14 MED ORDER — NICOTINE 21 MG/24HR TD PT24
21.0000 mg | MEDICATED_PATCH | Freq: Every day | TRANSDERMAL | Status: DC
  Administered 2021-11-14 – 2021-11-15 (×2): 21 mg via TRANSDERMAL
  Filled 2021-11-14 (×2): qty 1

## 2021-11-14 MED ORDER — OXYCODONE HCL 5 MG PO TABS
5.0000 mg | ORAL_TABLET | ORAL | Status: DC | PRN
  Administered 2021-11-14 – 2021-11-15 (×2): 5 mg via ORAL
  Filled 2021-11-14 (×3): qty 1

## 2021-11-14 MED ORDER — MAGNESIUM SULFATE 2 GM/50ML IV SOLN
2.0000 g | Freq: Once | INTRAVENOUS | Status: AC
  Administered 2021-11-14: 10:00:00 2 g via INTRAVENOUS
  Filled 2021-11-14: qty 50

## 2021-11-14 MED ORDER — POTASSIUM CHLORIDE CRYS ER 20 MEQ PO TBCR
40.0000 meq | EXTENDED_RELEASE_TABLET | ORAL | Status: AC
  Administered 2021-11-14: 12:00:00 40 meq via ORAL
  Filled 2021-11-14: qty 2

## 2021-11-14 MED ORDER — SODIUM CHLORIDE 0.9 % IV SOLN: INTRAVENOUS | Status: DC

## 2021-11-14 NOTE — Progress Notes (Signed)
PROGRESS NOTE     Mallory Mendoza, is a 40 y.o. female, DOB - [birth year], NLG:[id number]  Admit date - 11/13/2021   Admitting Physician Ardis Fullwood Denton Brick, MD  Outpatient Primary MD for the patient is Pcp, No  LOS - 0  Chief Complaint  Patient presents with   Abdominal Pain        Brief Narrative:   40 y.o. female with medical history significant for alcohol abuse, tobacco abuse, anxiety and depression admitted with persistent vomiting and nausea and abdominal pain along with elevated lipase and CT abdomen and pelvis findings consistent with acute pancreatitis  Assessment & Plan:   Principal Problem:   Acute pancreatitis   1) acute alcoholic pancreatitis --- patient admits to binge drinking around Christmas  --persistent vomiting and nausea and abdominal pain along with elevated lipase and CT abdomen and pelvis findings consistent with acute pancreatitis -IV morphine sulfate as needed, p.o. oxycodone as needed Zofran as needed -Try liquid diet -IV fluids as ordered  2)-alcohol abuse--- lorazepam per CIWA protocol, thiamine folic acid as ordered  3) tobacco abuse--- nicotine patch as ordered smoking cessation advised  4) hypokalemia--- due to intractable emesis secondary to #1 above, replace potassium  Disposition/Need for in-Hospital Stay- patient unable to be discharged at this time due to -acute pancreatitis requiring IV fluids IV antiemetics IV pain medications, pending tolerance of oral intake -Possible discharge in 1 to 2 days if tolerating oral intake  Status is: Inpatient  Remains inpatient appropriate because:   Disposition: The patient is from: Home              Anticipated d/c is to: Home              Anticipated d/c date is: 1 day              Patient currently is not medically stable to d/c. Barriers: Not Clinically Stable-   Code Status :  -  Code Status: Full Code   Family Communication:    NA (patient is alert, awake and coherent)   Consults  :     DVT Prophylaxis  :   - SCDs  enoxaparin (LOVENOX) injection 40 mg Start: 11/13/21 2200    Lab Results  Component Value Date   PLT 65 (L) 11/14/2021    Inpatient Medications  Scheduled Meds:  enoxaparin (LOVENOX) injection  40 mg Subcutaneous Y81K   folic acid  1 mg Oral Daily   multivitamin with minerals  1 tablet Oral Daily   nicotine  21 mg Transdermal Daily   thiamine  100 mg Oral Daily   Or   thiamine  100 mg Intravenous Daily   Continuous Infusions:  sodium chloride 150 mL/hr at 11/14/21 1059   PRN Meds:.acetaminophen **OR** acetaminophen, LORazepam **OR** LORazepam, morphine injection, ondansetron **OR** ondansetron (ZOFRAN) IV, polyethylene glycol   Anti-infectives (From admission, onward)    None        Subjective: Mallory Mendoza today has no fevers, No chest pain,    -Abdominal pain and nausea persist, no further emesis  Objective: Vitals:   11/13/21 2038 11/14/21 0022 11/14/21 0411 11/14/21 1415  BP: 128/87 (!) 127/96 (!) 131/98 107/74  Pulse: 82 73 83 77  Resp:  18 18 20   Temp: 97.9 F (36.6 C) 99.2 F (37.3 C) 98.7 F (37.1 C) 98.7 F (37.1 C)  TempSrc: Oral Oral Oral Oral  SpO2: 100% 100% 99% 100%  Weight:      Height:  Intake/Output Summary (Last 24 hours) at 11/14/2021 1721 Last data filed at 11/14/2021 1600 Gross per 24 hour  Intake 1951.18 ml  Output --  Net 1951.18 ml   Filed Weights   11/13/21 0856 11/13/21 1731  Weight: 61.2 kg 55.7 kg     Physical Exam  Gen:- Awake Alert,  in no apparent distress  HEENT:- [place].AT, No sclera icterus Neck-Supple Neck,No JVD,.  Lungs-  CTAB , fair symmetrical air movement CV- S1, S2 normal, regular  Abd-  +ve B.Sounds, Abd Soft, epigastric and left upper quadrant tenderness without rebound or guarding Extremity/Skin:- No  edema, pedal pulses present  Psych-affect is appropriate, oriented x3 Neuro-no new focal deficits, no tremors  Data Reviewed: I have personally reviewed  following labs and imaging studies  CBC: Recent Labs  Lab 11/13/21 0943 11/14/21 0851  WBC 4.8 3.6*  NEUTROABS 3.6  --   HGB 13.2 9.6*  HCT 39.0 29.0*  MCV 104.6* 103.9*  PLT 106* 65*   Basic Metabolic Panel: Recent Labs  Lab 11/13/21 0943 11/13/21 1738 11/14/21 0542  NA 136  --  133*  K 3.3*  --  2.8*  CL 92*  --  102  CO2 20*  --  24  GLUCOSE 140*  --  101*  BUN 13  --  5*  CREATININE 0.97  --  0.40*  CALCIUM 10.4*  --  8.3*  MG  --  1.8  --    GFR: Estimated Creatinine Clearance: 80.7 mL/min (A) (by C-G formula based on SCr of 0.4 mg/dL (L)). Liver Function Tests: Recent Labs  Lab 11/13/21 0943 11/14/21 0542  AST 46* 92*  ALT 26 21  ALKPHOS 92 60  BILITOT 3.2* 1.5*  PROT 9.1* 5.7*  ALBUMIN 5.3* 3.3*   Recent Labs  Lab 11/13/21 0943  LIPASE 573*   No results for input(s): AMMONIA in the last 168 hours. Coagulation Profile: No results for input(s): INR, PROTIME in the last 168 hours. Cardiac Enzymes: No results for input(s): CKTOTAL, CKMB, CKMBINDEX, TROPONINI in the last 168 hours. BNP (last 3 results) No results for input(s): PROBNP in the last 8760 hours. HbA1C: No results for input(s): HGBA1C in the last 72 hours. CBG: No results for input(s): GLUCAP in the last 168 hours. Lipid Profile: No results for input(s): CHOL, HDL, LDLCALC, TRIG, CHOLHDL, LDLDIRECT in the last 72 hours. Thyroid Function Tests: No results for input(s): TSH, T4TOTAL, FREET4, T3FREE, THYROIDAB in the last 72 hours. Anemia Panel: No results for input(s): VITAMINB12, FOLATE, FERRITIN, TIBC, IRON, RETICCTPCT in the last 72 hours. Urine analysis:    Component Value Date/Time   COLORURINE AMBER (A) 11/13/2021 0943   APPEARANCEUR CLOUDY (A) 11/13/2021 0943   LABSPEC 1.028 11/13/2021 0943   PHURINE 5.0 11/13/2021 0943   GLUCOSEU 50 (A) 11/13/2021 0943   HGBUR LARGE (A) 11/13/2021 0943   BILIRUBINUR MODERATE (A) 11/13/2021 0943   KETONESUR 80 (A) 11/13/2021 0943    PROTEINUR >=300 (A) 11/13/2021 0943   UROBILINOGEN 1.0 06/11/2014 0935   NITRITE NEGATIVE 11/13/2021 0943   LEUKOCYTESUR SMALL (A) 11/13/2021 0943   Sepsis Labs: @LABRCNTIP (procalcitonin:4,lacticidven:4)  ) Recent Results (from the past 240 hour(s))  Resp Panel by RT-PCR (Flu A&B, Covid) Nasopharyngeal Swab     Status: None   Collection Time: 11/13/21  9:43 AM   Specimen: Nasopharyngeal Swab; Nasopharyngeal(NP) swabs in vial transport medium  Result Value Ref Range Status   SARS Coronavirus 2 by RT PCR NEGATIVE NEGATIVE Final    Comment: (NOTE)  SARS-CoV-2 target nucleic acids are NOT DETECTED.  The SARS-CoV-2 RNA is generally detectable in upper respiratory specimens during the acute phase of infection. The lowest concentration of SARS-CoV-2 viral copies this assay can detect is 138 copies/mL. A negative result does not preclude SARS-Cov-2 infection and should not be used as the sole basis for treatment or other patient management decisions. A negative result may occur with  improper specimen collection/handling, submission of specimen other than nasopharyngeal swab, presence of viral mutation(s) within the areas targeted by this assay, and inadequate number of viral copies(<138 copies/mL). A negative result must be combined with clinical observations, patient history, and epidemiological information. The expected result is Negative.  Fact Sheet for Patients:  EntrepreneurPulse.com.au  Fact Sheet for Healthcare Providers:  IncredibleEmployment.be  This test is no t yet approved or cleared by the [place] FDA and  has been authorized for detection and/or diagnosis of SARS-CoV-2 by FDA under an Emergency Use Authorization (EUA). This EUA will remain  in effect (meaning this test can be used) for the duration of the COVID-19 declaration under Section 564(b)(1) of the Act, 21 U.S.C.section 360bbb-3(b)(1), unless the authorization is  terminated  or revoked sooner.       Influenza A by PCR NEGATIVE NEGATIVE Final   Influenza B by PCR NEGATIVE NEGATIVE Final    Comment: (NOTE) The Xpert Xpress SARS-CoV-2/FLU/RSV plus assay is intended as an aid in the diagnosis of influenza from Nasopharyngeal swab specimens and should not be used as a sole basis for treatment. Nasal washings and aspirates are unacceptable for Xpert Xpress SARS-CoV-2/FLU/RSV testing.  Fact Sheet for Patients: EntrepreneurPulse.com.au  Fact Sheet for Healthcare Providers: IncredibleEmployment.be  This test is not yet approved or cleared by the [place] FDA and has been authorized for detection and/or diagnosis of SARS-CoV-2 by FDA under an Emergency Use Authorization (EUA). This EUA will remain in effect (meaning this test can be used) for the duration of the COVID-19 declaration under Section 564(b)(1) of the Act, 21 U.S.C. section 360bbb-3(b)(1), unless the authorization is terminated or revoked.  Performed at [facility], [address], [place], [place] [address]       Radiology Studies: CT ABDOMEN PELVIS W CONTRAST  Result Date: 11/13/2021 CLINICAL DATA:  Abdominal pain, nausea, vomiting EXAM: CT ABDOMEN AND PELVIS WITH CONTRAST TECHNIQUE: Multidetector CT imaging of the abdomen and pelvis was performed using the standard protocol following bolus administration of intravenous contrast. CONTRAST:  44mL OMNIPAQUE IOHEXOL 300 MG/ML  SOLN COMPARISON:  02/26/2021 FINDINGS: Lower chest: Unremarkable. Hepatobiliary: Liver measures 19.3 cm in length. There is marked fatty infiltration in the liver. There is no dilation of bile ducts. Gallbladder is unremarkable. Pancreas: There are no loculated fluid collections in the pancreas. There is no dilation of pancreatic duct. There is mild stranding in the fat planes adjacent to duodenum and head of pancreas. Spleen: Unremarkable. Adrenals/Urinary Tract: Adrenals  are unremarkable. There is no hydronephrosis. There are no renal or ureteral stones. Urinary bladder is unremarkable. Stomach/Bowel: Stomach is not distended. Small bowel loops are not dilated. Appendix is not dilated. There is mild diffuse wall thickening in colon. There is incomplete distention of colon. Scattered diverticula are seen without signs of focal acute diverticulitis. There is stranding in the fat planes adjacent to hepatic flexure. There are small pockets of fluid collection adjacent to small bowel loops in the lower abdomen and right side of pelvis. Vascular/Lymphatic: Scattered atherosclerotic plaques and calcifications are seen in the aorta and its major branches.  Reproductive: There is interval removal of large dermoid from the right adnexa. There are smooth marginated fluid density structures in the left adnexa each measuring less than 3.1 cm. No other significant abnormality is seen. Other: There is no pneumoperitoneum. Small umbilical and paraumbilical hernias containing fat are noted. Musculoskeletal: Unremarkable. IMPRESSION: There is no evidence of intestinal obstruction or pneumoperitoneum. There is no hydronephrosis. Appendix is not dilated. There is mild stranding in the fat planes adjacent to the head of the pancreas and adjacent to hepatic flexure. Findings may suggest acute pancreatitis and possibly nonspecific colitis. Small amount of ascites is present in the lower abdomen and pelvis. Enlarged fatty liver.  Diverticulosis of colon. There few smooth marginated fluid density structures in the left adnexa, possibly dominant follicles or functional cysts. There is interval resection of right adnexal dermoid. Electronically Signed   By: Elmer Picker M.D.   On: 11/13/2021 12:44   DG Chest Port 1 View  Result Date: 11/13/2021 CLINICAL DATA:  Cough, fever. EXAM: PORTABLE CHEST 1 VIEW COMPARISON:  July 04, 2018. FINDINGS: The heart size and mediastinal contours are within normal  limits. Both lungs are clear. The visualized skeletal structures are unremarkable. IMPRESSION: No active disease. Electronically Signed   By: Marijo Conception M.D.   On: 11/13/2021 09:34     Scheduled Meds:  enoxaparin (LOVENOX) injection  40 mg Subcutaneous Y69S   folic acid  1 mg Oral Daily   multivitamin with minerals  1 tablet Oral Daily   nicotine  21 mg Transdermal Daily   thiamine  100 mg Oral Daily   Or   thiamine  100 mg Intravenous Daily   Continuous Infusions:  sodium chloride 150 mL/hr at 11/14/21 1059     LOS: 0 days    Roxan Hockey M.D on 11/14/2021 at 5:21 PM  Go to [email or web address] - for contact info  [facility] - Office  [phone]  If 7PM-7AM, please contact night-coverage [email or web address] Password [email or web address] 11/14/2021, 5:21 PM

## 2021-11-14 NOTE — Progress Notes (Signed)
Patient declines SA resources.    Dshaun Reppucci, Clydene Pugh, LCSW

## 2021-11-15 LAB — RENAL FUNCTION PANEL
Albumin: 3.1 g/dL — ABNORMAL LOW (ref 3.5–5.0)
Anion gap: 8 (ref 5–15)
BUN: <5 mg/dL — ABNORMAL LOW (ref 6–20)
CO2: 26 mmol/L (ref 22–32)
Calcium: 8.4 mg/dL — ABNORMAL LOW (ref 8.9–10.3)
Chloride: 103 mmol/L (ref 98–111)
Creatinine, Ser: 0.3 mg/dL — ABNORMAL LOW (ref 0.44–1.00)
GFR, Estimated: >60 mL/min (ref >60)
Glucose, Bld: 84 mg/dL (ref 70–99)
Phosphorus: <1 mg/dL — CL (ref 2.5–4.6)
Potassium: 3 mmol/L — ABNORMAL LOW (ref 3.5–5.1)
Sodium: 137 mmol/L (ref 135–145)

## 2021-11-15 MED ORDER — POTASSIUM PHOSPHATES 15 MMOLE/5ML IV SOLN
30.0000 mmol | Freq: Once | INTRAVENOUS | Status: AC
  Administered 2021-11-15: 30 mmol via INTRAVENOUS
  Filled 2021-11-15: qty 10

## 2021-11-15 MED ORDER — FOLIC ACID 1 MG PO TABS: 1.0000 mg | ORAL_TABLET | Freq: Every day | ORAL | 2 refills | Status: DC

## 2021-11-15 MED ORDER — POLYETHYLENE GLYCOL 3350 17 G PO PACK: 17.0000 g | PACK | Freq: Every day | ORAL | 0 refills | Status: DC | PRN

## 2021-11-15 MED ORDER — ONDANSETRON HCL 4 MG PO TABS: 4.0000 mg | ORAL_TABLET | Freq: Four times a day (QID) | ORAL | 0 refills | Status: DC | PRN

## 2021-11-15 MED ORDER — POTASSIUM CHLORIDE CRYS ER 20 MEQ PO TBCR
40.0000 meq | EXTENDED_RELEASE_TABLET | ORAL | Status: AC
  Administered 2021-11-15 (×2): 40 meq via ORAL
  Filled 2021-11-15 (×2): qty 2

## 2021-11-15 MED ORDER — NICOTINE 21 MG/24HR TD PT24: 21.0000 mg | MEDICATED_PATCH | Freq: Every day | TRANSDERMAL | 0 refills | Status: DC

## 2021-11-15 MED ORDER — ADULT MULTIVITAMIN W/MINERALS CH: 1.0000 | ORAL_TABLET | Freq: Every day | ORAL | 1 refills | Status: DC

## 2021-11-15 MED ORDER — SODIUM CHLORIDE 0.9 % IV BOLUS
1000.0000 mL | Freq: Once | INTRAVENOUS | Status: AC
  Administered 2021-11-15: 1000 mL via INTRAVENOUS

## 2021-11-15 MED ORDER — THIAMINE HCL 100 MG PO TABS: 100.0000 mg | ORAL_TABLET | Freq: Every day | ORAL | 2 refills | Status: DC

## 2021-11-15 MED ORDER — POTASSIUM CHLORIDE CRYS ER 20 MEQ PO TBCR: EXTENDED_RELEASE_TABLET | ORAL | 0 refills | Status: DC

## 2021-11-15 MED ORDER — PANTOPRAZOLE SODIUM 40 MG PO TBEC: 40.0000 mg | DELAYED_RELEASE_TABLET | Freq: Every day | ORAL | 1 refills | Status: DC

## 2021-11-15 NOTE — Progress Notes (Signed)
Date and time results received: 11/15/21 0855 (use smartphrase ".now" to insert current time)  Test: Phosphorus Critical Value: Less than 1.0  Name of Provider Notified: Dr. Maurene Capes  Orders Received? Or Actions Taken?: Orders Received - See Orders for details
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
## 2021-11-15 NOTE — Progress Notes (Signed)
Pt discharged to home. DC instructions given. No concerns voiced. Left unit in wheelchair pushed by nurse tech and accompanied by female family member. Left in stable condition. Pt encouraged to stop by pharmacy and pick up meds that were e-prescribed by MD. Voiced understanding.  VWilliams,RN.

## 2021-11-15 NOTE — Discharge Instructions (Addendum)
1) complete abstinence from alcohol advised 2) abstinence from tobacco advised-please use nicotine patch to help you quit smoking 3) avoid spicy and greasy foods 4)follow up with primary care provider for recheck in 1 week or so

## 2021-11-17 ENCOUNTER — Telehealth: Payer: Self-pay

## 2021-11-17 NOTE — Telephone Encounter (Signed)
Transition Care Management Follow-up Telephone Call Date of discharge and from where: 11/15/2021 from [facility] How have you been since you were released from the hospital? Pt stated that she is feeling okay. Pt did not have any questions or concerns at this time.  Any questions or concerns? No  Items Reviewed: Did the pt receive and understand the discharge instructions provided? Yes  Medications obtained and verified? Yes  Other? No  Any new allergies since your discharge? No  Dietary orders reviewed? No Do you have support at home? Yes   Functional Questionnaire: (I = Independent and D = Dependent) ADLs: I Bathing/Dressing- I Meal Prep- I Eating- I Maintaining continence- I Transferring/Ambulation- I Managing Meds- I   Follow up appointments reviewed: PCP Hospital f/u appt confirmed? No  Pt stated that she is interested in establishing with PCP. [facility] contact information given.  [facility] f/u appt confirmed? No   Are transportation arrangements needed? No  If their condition worsens, is the pt aware to call PCP or go to the Emergency Dept.? Yes Was the patient provided with contact information for the PCP's office or ED? Yes Was to pt encouraged to call back with questions or concerns? Yes

## 2021-11-20 ENCOUNTER — Telehealth: Payer: Self-pay

## 2021-11-20 NOTE — Telephone Encounter (Signed)
. °  Medicaid Managed Care   Unsuccessful Outreach Note  11/20/2021 Name: Mallory Mendoza MRN: [id number] DOB: 09-03-1981  Referred by: Pcp, No Reason for referral : High Risk Managed Medicaid (I called the patient today to get her scheduled with the MM Team. I left my name and number on her VM.)   An unsuccessful telephone outreach was attempted today. The patient was referred to the case management team for assistance with care management and care coordination.   Follow Up Plan: The care management team will reach out to the patient again over the next 14 days.   [place]

## 2021-12-11 ENCOUNTER — Telehealth: Payer: Self-pay

## 2021-12-11 NOTE — Telephone Encounter (Signed)
..  Patient declines further follow up and engagement by the Managed Medicaid Team. Appropriate care team members and provider have been notified via electronic communication. The Managed Medicaid Team is available to follow up with the patient after provider conversation with the patient regarding recommendation for engagement and subsequent re-referral to the Managed Medicaid Team.    Jennifer Alley Care Guide, High Risk Medicaid Managed Care Embedded Care Coordination [place]  [facility]   

## 2022-02-02 ENCOUNTER — Ambulatory Visit: Payer: Medicaid Other | Admitting: Nurse Practitioner

## 2022-03-03 ENCOUNTER — Ambulatory Visit: Payer: Medicaid Other | Admitting: Nurse Practitioner

## 2022-07-17 ENCOUNTER — Emergency Department (HOSPITAL_COMMUNITY)
Admission: EM | Admit: 2022-07-17 | Discharge: 2022-07-17 | Disposition: A | Discharge status: Home / Self Care | Payer: Medicaid Other | Attending: Emergency Medicine | Admitting: Emergency Medicine

## 2022-07-17 ENCOUNTER — Other Ambulatory Visit: Payer: Self-pay

## 2022-07-17 ENCOUNTER — Encounter (HOSPITAL_COMMUNITY): Payer: Self-pay

## 2022-07-17 DIAGNOSIS — H6002 Abscess of left external ear: Secondary | ICD-10-CM | POA: Insufficient documentation

## 2022-07-17 DIAGNOSIS — L0291 Cutaneous abscess, unspecified: Secondary | ICD-10-CM

## 2022-07-17 MED ORDER — OXYCODONE-ACETAMINOPHEN 5-325 MG PO TABS
1.0000 | ORAL_TABLET | Freq: Once | ORAL | Status: AC
  Administered 2022-07-17: 1 via ORAL
  Filled 2022-07-17: qty 1

## 2022-07-17 MED ORDER — IBUPROFEN 800 MG PO TABS
800.0000 mg | ORAL_TABLET | Freq: Once | ORAL | Status: AC
  Administered 2022-07-17: 800 mg via ORAL
  Filled 2022-07-17: qty 1

## 2022-07-17 MED ORDER — IBUPROFEN 600 MG PO TABS: 600.0000 mg | ORAL_TABLET | Freq: Four times a day (QID) | ORAL | 0 refills | Status: AC | PRN

## 2022-07-17 MED ORDER — OXYCODONE HCL 5 MG PO TABS: 5.0000 mg | ORAL_TABLET | Freq: Four times a day (QID) | ORAL | 0 refills | Status: DC | PRN

## 2022-07-17 MED ORDER — DOXYCYCLINE HYCLATE 100 MG PO CAPS: 100.0000 mg | ORAL_CAPSULE | Freq: Two times a day (BID) | ORAL | 0 refills | Status: AC

## 2022-07-17 MED ORDER — LIDOCAINE HCL (PF) 1 % IJ SOLN
5.0000 mL | Freq: Once | INTRAMUSCULAR | Status: AC
  Administered 2022-07-17: 5 mL via INTRADERMAL
  Filled 2022-07-17: qty 5

## 2022-07-17 NOTE — ED Triage Notes (Signed)
Pt presents with abscess to left ear. States she noticed it starting to form about 3 days ago. Pt has hx of multiple abscesses.

## 2022-07-17 NOTE — ED Provider Notes (Signed)
[facility] EMERGENCY DEPARTMENT Provider Note   CSN: [id number] Arrival date & time: 07/17/22  2376     History  Chief Complaint  Patient presents with   Abscess    Mallory Mendoza is a 41 y.o. female w/ hx of recurring abscesses here with abscess below her left ear, onset 3 days ago, tried hot compresses  HPI     Home Medications Prior to Admission medications   Medication Sig Start Date End Date Taking? Authorizing Provider  doxycycline (VIBRAMYCIN) 100 MG capsule Take 1 capsule (100 mg total) by mouth 2 (two) times daily for 5 days. 07/17/22 07/22/22 Yes Correll Denbow, Carola Rhine, MD  ibuprofen (ADVIL) 600 MG tablet Take 1 tablet (600 mg total) by mouth every 6 (six) hours as needed for moderate pain or mild pain. 07/17/22 08/16/22 Yes Kedra Mcglade, Carola Rhine, MD  oxyCODONE (ROXICODONE) 5 MG immediate release tablet Take 1 tablet (5 mg total) by mouth every 6 (six) hours as needed for up to 5 doses for severe pain. 07/17/22  Yes Oluwadara Gorman, Carola Rhine, MD  folic acid (FOLVITE) 1 MG tablet Take 1 tablet (1 mg total) by mouth daily. 11/16/21   Roxan Hockey, MD  Multiple Vitamin (MULTIVITAMIN WITH MINERALS) TABS tablet Take 1 tablet by mouth daily. 11/16/21   Roxan Hockey, MD  nicotine (NICODERM CQ - DOSED IN MG/24 HOURS) 21 mg/24hr patch Place 1 patch (21 mg total) onto the skin daily. 11/16/21   Roxan Hockey, MD  ondansetron (ZOFRAN) 4 MG tablet Take 1 tablet (4 mg total) by mouth every 6 (six) hours as needed for nausea. 11/15/21   Roxan Hockey, MD  pantoprazole (PROTONIX) 40 MG tablet Take 1 tablet (40 mg total) by mouth daily. 11/15/21 11/15/22  Roxan Hockey, MD  polyethylene glycol (MIRALAX / GLYCOLAX) 17 g packet Take 17 g by mouth daily as needed for mild constipation. 11/15/21   Roxan Hockey, MD  potassium chloride SA (KLOR-CON M) 20 MEQ tablet Take one tablet bid x 3 days, then take one tablet once a day 11/15/21   Roxan Hockey, MD  thiamine 100 MG tablet Take 1 tablet (100 mg  total) by mouth daily. 11/16/21   Roxan Hockey, MD      Allergies    Patient has no known allergies.    Review of Systems   Review of Systems  Physical Exam Updated Vital Signs BP 96/75   Pulse 87   Temp 98.1 F (36.7 C)   Resp 15   Ht '5\' 4"'$  (1.626 m)   Wt 56 kg   LMP 05/17/2022 Comment: States she had tubes tied  SpO2 94%   BMI 21.19 kg/m  Physical Exam Constitutional:      General: She is not in acute distress. HENT:     Head: Normocephalic and atraumatic.  Eyes:     Conjunctiva/sclera: Conjunctivae normal.     Pupils: Pupils are equal, round, and reactive to light.  Cardiovascular:     Rate and Rhythm: Normal rate and regular rhythm.  Pulmonary:     Effort: Pulmonary effort is normal. No respiratory distress.  Skin:    General: Skin is warm and dry.     Comments: Tender fluctuant 1 cm bulge below left pinna  Neurological:     General: No focal deficit present.     Mental Status: She is alert. Mental status is at baseline.  Psychiatric:        Mood and Affect: Mood normal.  Behavior: Behavior normal.     ED Results / Procedures / Treatments   Labs (all labs ordered are listed, but only abnormal results are displayed) Labs Reviewed - No data to display  EKG None  Radiology No results found.  Procedures .Marland KitchenIncision and Drainage  Date/Time: 07/17/2022 7:58 AM  Performed by: Wyvonnia Dusky, MD Authorized by: Wyvonnia Dusky, MD   Consent:    Consent obtained:  Verbal   Consent given by:  Patient   Risks discussed:  Bleeding, incomplete drainage, infection and pain Universal protocol:    Procedure explained and questions answered to patient or proxy's satisfaction: yes     Site/side marked: yes     Immediately prior to procedure, a time out was called: yes     Patient identity confirmed:  Arm band Location:    Type:  Abscess   Size:  1 cm   Location:  Head   Head location:  L external ear Pre-procedure details:    Skin preparation:   Chlorhexidine with alcohol Sedation:    Sedation type:  None Anesthesia:    Anesthesia method:  Local infiltration   Local anesthetic:  Lidocaine 1% w/o epi Procedure type:    Complexity:  Simple Procedure details:    Ultrasound guidance: yes     Needle aspiration: no     Incision types:  Single straight   Wound management:  Probed and deloculated   Drainage:  Purulent   Drainage amount:  Moderate   Wound treatment:  Wound left open   Packing materials:  None Post-procedure details:    Procedure completion:  Tolerated well, no immediate complications     Medications Ordered in ED Medications  oxyCODONE-acetaminophen (PERCOCET/ROXICET) 5-325 MG per tablet 1 tablet (1 tablet Oral Given 07/17/22 0720)  ibuprofen (ADVIL) tablet 800 mg (800 mg Oral Given 07/17/22 0720)  lidocaine (PF) (XYLOCAINE) 1 % injection 5 mL (5 mLs Intradermal Given 07/17/22 0720)    ED Course/ Medical Decision Making/ A&P                           Medical Decision Making Risk Prescription drug management.   Abscess below left ear Ultrasound shows superficial collection approx 1.5 x 1 cm depth Patient consented for I&D Pain medications given  Started on doxycycline x 5 days Advised dermatology outpatient follow up - she may have hidranitis as she has had these abscesses her whole life          Final Clinical Impression(s) / ED Diagnoses Final diagnoses:  Abscess    Rx / DC Orders ED Discharge Orders          Ordered    doxycycline (VIBRAMYCIN) 100 MG capsule  2 times daily        07/17/22 0757    oxyCODONE (ROXICODONE) 5 MG immediate release tablet  Every 6 hours PRN        07/17/22 0757    ibuprofen (ADVIL) 600 MG tablet  Every 6 hours PRN        07/17/22 0757              Wyvonnia Dusky, MD 07/17/22 [phone]

## 2022-10-30 ENCOUNTER — Emergency Department (HOSPITAL_COMMUNITY): Payer: Medicaid Other

## 2022-10-30 ENCOUNTER — Inpatient Hospital Stay (HOSPITAL_COMMUNITY)
Admission: EM | Admit: 2022-10-30 | Discharge: 2022-11-08 | DRG: 432 | Disposition: A | Discharge status: Home Health | Payer: Medicaid Other | Attending: Family Medicine | Admitting: Family Medicine

## 2022-10-30 ENCOUNTER — Encounter (HOSPITAL_COMMUNITY): Payer: Self-pay

## 2022-10-30 ENCOUNTER — Other Ambulatory Visit: Payer: Self-pay

## 2022-10-30 DIAGNOSIS — R6510 Systemic inflammatory response syndrome (SIRS) of non-infectious origin without acute organ dysfunction: Secondary | ICD-10-CM | POA: Diagnosis present

## 2022-10-30 DIAGNOSIS — K7200 Acute and subacute hepatic failure without coma: Secondary | ICD-10-CM | POA: Diagnosis not present

## 2022-10-30 DIAGNOSIS — F1010 Alcohol abuse, uncomplicated: Secondary | ICD-10-CM | POA: Diagnosis not present

## 2022-10-30 DIAGNOSIS — K920 Hematemesis: Secondary | ICD-10-CM | POA: Diagnosis not present

## 2022-10-30 DIAGNOSIS — E876 Hypokalemia: Secondary | ICD-10-CM | POA: Diagnosis not present

## 2022-10-30 DIAGNOSIS — N179 Acute kidney failure, unspecified: Secondary | ICD-10-CM

## 2022-10-30 DIAGNOSIS — E871 Hypo-osmolality and hyponatremia: Secondary | ICD-10-CM | POA: Diagnosis present

## 2022-10-30 DIAGNOSIS — K7031 Alcoholic cirrhosis of liver with ascites: Secondary | ICD-10-CM | POA: Diagnosis present

## 2022-10-30 DIAGNOSIS — R109 Unspecified abdominal pain: Secondary | ICD-10-CM | POA: Diagnosis not present

## 2022-10-30 DIAGNOSIS — R52 Pain, unspecified: Secondary | ICD-10-CM | POA: Diagnosis not present

## 2022-10-30 DIAGNOSIS — K704 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: Secondary | ICD-10-CM | POA: Diagnosis present

## 2022-10-30 DIAGNOSIS — F10239 Alcohol dependence with withdrawal, unspecified: Secondary | ICD-10-CM | POA: Diagnosis present

## 2022-10-30 DIAGNOSIS — R531 Weakness: Secondary | ICD-10-CM | POA: Diagnosis not present

## 2022-10-30 DIAGNOSIS — I959 Hypotension, unspecified: Secondary | ICD-10-CM | POA: Diagnosis not present

## 2022-10-30 DIAGNOSIS — E8809 Other disorders of plasma-protein metabolism, not elsewhere classified: Secondary | ICD-10-CM | POA: Diagnosis not present

## 2022-10-30 DIAGNOSIS — K7682 Hepatic encephalopathy: Secondary | ICD-10-CM | POA: Diagnosis not present

## 2022-10-30 DIAGNOSIS — R079 Chest pain, unspecified: Secondary | ICD-10-CM | POA: Diagnosis not present

## 2022-10-30 DIAGNOSIS — Z79899 Other long term (current) drug therapy: Secondary | ICD-10-CM

## 2022-10-30 DIAGNOSIS — E861 Hypovolemia: Secondary | ICD-10-CM | POA: Diagnosis present

## 2022-10-30 DIAGNOSIS — I700 Atherosclerosis of aorta: Secondary | ICD-10-CM | POA: Diagnosis not present

## 2022-10-30 DIAGNOSIS — A419 Sepsis, unspecified organism: Secondary | ICD-10-CM | POA: Insufficient documentation

## 2022-10-30 DIAGNOSIS — K7460 Unspecified cirrhosis of liver: Secondary | ICD-10-CM | POA: Diagnosis not present

## 2022-10-30 DIAGNOSIS — F17210 Nicotine dependence, cigarettes, uncomplicated: Secondary | ICD-10-CM | POA: Diagnosis present

## 2022-10-30 DIAGNOSIS — N390 Urinary tract infection, site not specified: Secondary | ICD-10-CM | POA: Insufficient documentation

## 2022-10-30 DIAGNOSIS — J9811 Atelectasis: Secondary | ICD-10-CM | POA: Diagnosis not present

## 2022-10-30 DIAGNOSIS — Z8249 Family history of ischemic heart disease and other diseases of the circulatory system: Secondary | ICD-10-CM

## 2022-10-30 DIAGNOSIS — D62 Acute posthemorrhagic anemia: Secondary | ICD-10-CM | POA: Diagnosis not present

## 2022-10-30 DIAGNOSIS — K7290 Hepatic failure, unspecified without coma: Secondary | ICD-10-CM

## 2022-10-30 DIAGNOSIS — D649 Anemia, unspecified: Secondary | ICD-10-CM | POA: Diagnosis not present

## 2022-10-30 DIAGNOSIS — E7220 Disorder of urea cycle metabolism, unspecified: Secondary | ICD-10-CM

## 2022-10-30 DIAGNOSIS — K7030 Alcoholic cirrhosis of liver without ascites: Secondary | ICD-10-CM

## 2022-10-30 DIAGNOSIS — Z7189 Other specified counseling: Secondary | ICD-10-CM | POA: Diagnosis not present

## 2022-10-30 DIAGNOSIS — R188 Other ascites: Secondary | ICD-10-CM

## 2022-10-30 DIAGNOSIS — I10 Essential (primary) hypertension: Secondary | ICD-10-CM | POA: Diagnosis not present

## 2022-10-30 DIAGNOSIS — K7011 Alcoholic hepatitis with ascites: Secondary | ICD-10-CM

## 2022-10-30 DIAGNOSIS — K226 Gastro-esophageal laceration-hemorrhage syndrome: Secondary | ICD-10-CM | POA: Diagnosis not present

## 2022-10-30 DIAGNOSIS — R1011 Right upper quadrant pain: Secondary | ICD-10-CM | POA: Diagnosis not present

## 2022-10-30 DIAGNOSIS — R112 Nausea with vomiting, unspecified: Secondary | ICD-10-CM | POA: Diagnosis not present

## 2022-10-30 DIAGNOSIS — Z515 Encounter for palliative care: Secondary | ICD-10-CM | POA: Diagnosis not present

## 2022-10-30 DIAGNOSIS — K760 Fatty (change of) liver, not elsewhere classified: Secondary | ICD-10-CM | POA: Diagnosis not present

## 2022-10-30 HISTORY — DX: Biliary cirrhosis, unspecified: K74.5

## 2022-10-30 LAB — URINALYSIS, ROUTINE W REFLEX MICROSCOPIC
Bilirubin Urine: NEGATIVE
Glucose, UA: NEGATIVE mg/dL
Glucose, UA: 50 mg/dL — AB
Ketones, ur: NEGATIVE mg/dL
Ketones, ur: NEGATIVE mg/dL
Nitrite: NEGATIVE
Nitrite: NEGATIVE
Protein, ur: 100 mg/dL — AB
Protein, ur: 30 mg/dL — AB
RBC / HPF: >50 RBC/hpf — ABNORMAL HIGH (ref 0–5)
Specific Gravity, Urine: 1.019 (ref 1.005–1.030)
Specific Gravity, Urine: 1.002 — ABNORMAL LOW (ref 1.005–1.030)
WBC, UA: >50 WBC/hpf — ABNORMAL HIGH (ref 0–5)
pH: 5 (ref 5.0–8.0)
pH: 5 (ref 5.0–8.0)

## 2022-10-30 LAB — COMPREHENSIVE METABOLIC PANEL
ALT: 22 U/L (ref 0–44)
AST: 117 U/L — ABNORMAL HIGH (ref 15–41)
Albumin: 1.8 g/dL — ABNORMAL LOW (ref 3.5–5.0)
Alkaline Phosphatase: 199 U/L — ABNORMAL HIGH (ref 38–126)
Anion gap: 22 — ABNORMAL HIGH (ref 5–15)
BUN: 63 mg/dL — ABNORMAL HIGH (ref 6–20)
CO2: 21 mmol/L — ABNORMAL LOW (ref 22–32)
Calcium: 7.1 mg/dL — ABNORMAL LOW (ref 8.9–10.3)
Chloride: 85 mmol/L — ABNORMAL LOW (ref 98–111)
Creatinine, Ser: 8.87 mg/dL — ABNORMAL HIGH (ref 0.44–1.00)
GFR, Estimated: 5 mL/min — ABNORMAL LOW (ref >60)
Glucose, Bld: 103 mg/dL — ABNORMAL HIGH (ref 70–99)
Potassium: 4.6 mmol/L (ref 3.5–5.1)
Sodium: 128 mmol/L — ABNORMAL LOW (ref 135–145)
Total Bilirubin: 11.9 mg/dL — ABNORMAL HIGH (ref 0.3–1.2)
Total Protein: 6.3 g/dL — ABNORMAL LOW (ref 6.5–8.1)

## 2022-10-30 LAB — CBC WITH DIFFERENTIAL/PLATELET
Abs Immature Granulocytes: 0.8 10*3/uL — ABNORMAL HIGH (ref 0.00–0.07)
Band Neutrophils: 4 %
Basophils Absolute: 0 10*3/uL (ref 0.0–0.1)
Basophils Relative: 0 %
Eosinophils Absolute: 0.3 10*3/uL (ref 0.0–0.5)
Eosinophils Relative: 2 %
HCT: 20.7 % — ABNORMAL LOW (ref 36.0–46.0)
Hemoglobin: 6.9 g/dL — CL (ref 12.0–15.0)
Lymphocytes Relative: 9 %
Lymphs Abs: 1.5 10*3/uL (ref 0.7–4.0)
MCH: 34.7 pg — ABNORMAL HIGH (ref 26.0–34.0)
MCHC: 33.3 g/dL (ref 30.0–36.0)
MCV: 104 fL — ABNORMAL HIGH (ref 80.0–100.0)
Metamyelocytes Relative: 4 %
Monocytes Absolute: 1.3 10*3/uL — ABNORMAL HIGH (ref 0.1–1.0)
Monocytes Relative: 8 %
Myelocytes: 1 %
Neutro Abs: 12.6 10*3/uL — ABNORMAL HIGH (ref 1.7–7.7)
Neutrophils Relative %: 72 %
Platelets: 210 10*3/uL (ref 150–400)
RBC: 1.99 MIL/uL — ABNORMAL LOW (ref 3.87–5.11)
RDW: 23.5 % — ABNORMAL HIGH (ref 11.5–15.5)
WBC: 16.6 10*3/uL — ABNORMAL HIGH (ref 4.0–10.5)
nRBC: 0 % (ref 0.0–0.2)

## 2022-10-30 LAB — PREGNANCY, URINE: Preg Test, Ur: NEGATIVE

## 2022-10-30 LAB — MAGNESIUM: Magnesium: 2.4 mg/dL (ref 1.7–2.4)

## 2022-10-30 LAB — ABO/RH: ABO/RH(D): AB POS

## 2022-10-30 LAB — PROTIME-INR
INR: 1.6 — ABNORMAL HIGH (ref 0.8–1.2)
Prothrombin Time: 18.9 seconds — ABNORMAL HIGH (ref 11.4–15.2)

## 2022-10-30 LAB — PREPARE RBC (CROSSMATCH)

## 2022-10-30 LAB — LIPASE, BLOOD: Lipase: 35 U/L (ref 11–51)

## 2022-10-30 LAB — AMMONIA: Ammonia: 78 umol/L — ABNORMAL HIGH (ref 9–35)

## 2022-10-30 MED ORDER — HYDROMORPHONE HCL 1 MG/ML IJ SOLN
1.0000 mg | Freq: Once | INTRAMUSCULAR | Status: AC
  Administered 2022-10-30: 1 mg via INTRAVENOUS
  Filled 2022-10-30 (×2): qty 1

## 2022-10-30 MED ORDER — HYDROMORPHONE HCL 1 MG/ML IJ SOLN
0.5000 mg | INTRAMUSCULAR | Status: DC | PRN
  Administered 2022-10-30 – 2022-11-05 (×16): 0.5 mg via INTRAVENOUS
  Filled 2022-10-30 (×16): qty 0.5

## 2022-10-30 MED ORDER — SODIUM CHLORIDE 0.9 % IV SOLN
1.0000 g | INTRAVENOUS | Status: DC
  Administered 2022-10-31 – 2022-11-02 (×3): 1 g via INTRAVENOUS
  Filled 2022-10-30 (×4): qty 10

## 2022-10-30 MED ORDER — SODIUM CHLORIDE 0.9 % IV SOLN
50.0000 ug/h | INTRAVENOUS | Status: DC
  Administered 2022-10-30 – 2022-11-04 (×6): 50 ug/h via INTRAVENOUS
  Filled 2022-10-30 (×15): qty 1

## 2022-10-30 MED ORDER — OCTREOTIDE LOAD VIA INFUSION
50.0000 ug | Freq: Once | INTRAVENOUS | Status: AC
  Administered 2022-10-30: 50 ug via INTRAVENOUS
  Filled 2022-10-30: qty 25

## 2022-10-30 MED ORDER — ALBUMIN HUMAN 25 % IV SOLN
50.0000 g | Freq: Three times a day (TID) | INTRAVENOUS | Status: DC
  Administered 2022-10-30 – 2022-10-31 (×3): 50 g via INTRAVENOUS
  Filled 2022-10-30 (×4): qty 200

## 2022-10-30 MED ORDER — URSODIOL 300 MG PO CAPS
300.0000 mg | ORAL_CAPSULE | Freq: Three times a day (TID) | ORAL | Status: DC
  Administered 2022-10-30 – 2022-11-08 (×23): 300 mg via ORAL
  Filled 2022-10-30 (×31): qty 1

## 2022-10-30 MED ORDER — NICOTINE 14 MG/24HR TD PT24
14.0000 mg | MEDICATED_PATCH | Freq: Once | TRANSDERMAL | Status: AC
  Administered 2022-10-30: 14 mg via TRANSDERMAL
  Filled 2022-10-30: qty 1

## 2022-10-30 MED ORDER — LACTULOSE 10 GM/15ML PO SOLN
20.0000 g | Freq: Two times a day (BID) | ORAL | Status: DC
  Administered 2022-10-30: 20 g via ORAL
  Filled 2022-10-30: qty 30

## 2022-10-30 MED ORDER — ONDANSETRON HCL 4 MG/2ML IJ SOLN
4.0000 mg | Freq: Once | INTRAMUSCULAR | Status: AC
  Administered 2022-10-30: 4 mg via INTRAVENOUS
  Filled 2022-10-30: qty 2

## 2022-10-30 MED ORDER — SODIUM CHLORIDE 0.9 % IV BOLUS
1000.0000 mL | Freq: Once | INTRAVENOUS | Status: AC
  Administered 2022-10-30: 1000 mL via INTRAVENOUS

## 2022-10-30 MED ORDER — PANTOPRAZOLE SODIUM 40 MG IV SOLR
40.0000 mg | Freq: Two times a day (BID) | INTRAVENOUS | Status: DC
  Administered 2022-10-30 – 2022-11-08 (×18): 40 mg via INTRAVENOUS
  Filled 2022-10-30 (×18): qty 10
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
## 2022-10-30 NOTE — ED Notes (Signed)
Pt given saltine crackers and gingerale for PO challenge, ok per Dr Nehemiah Settle

## 2022-10-30 NOTE — ED Notes (Signed)
Pt has urine results but pt has not provided a urine while in ED. Lab called and edp aware. Lab stated they will call me back.

## 2022-10-30 NOTE — ED Notes (Signed)
Pt walking to bathroom to attempt to give urine sample

## 2022-10-30 NOTE — ED Triage Notes (Signed)
Patient here with abdominal pain with N/V. States this has been a chronic condition over the past year. Diagnosed with Cirrhosis of the liver. Sclera yellow, states this was noted three days by mother.

## 2022-10-30 NOTE — ED Notes (Signed)
Pt tolerating gingerale and crackers
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
## 2022-10-30 NOTE — ED Provider Notes (Signed)
[facility] Provider Note   CSN: [id number] Arrival date & time: 10/30/22  1102     History {Add pertinent medical, surgical, social history, OB history to HPI:1} Chief Complaint  Patient presents with   Abdominal Pain   HPI Mallory Mendoza is a 41 y.o. female with alcohol abuse disorder and cirrhosis of the liver presenting for abdominal pain.  Started about 8 days ago with severe vomiting.  Vomiting approximately 20 times a day and states she "cannot keep anything down".  Also states she has not had a BM in the last 8 days.  Denies fever.  Abdominal pain is located in the upper abdomen at times radiates to the back.  Patient also mention that her eyes appear to be more yellow.  States that she was diagnosed with liver cirrhosis a year ago.  Denies bloody stools, hematemesis, and hematuria.  Denies any changes with urination.  Also mentioned that her abdomen has been noticeably more distended in the last month. States that at this time she is no longer drinking alcohol.    Abdominal Pain      Home Medications Prior to Admission medications   Medication Sig Start Date End Date Taking? Authorizing Provider  folic acid (FOLVITE) 1 MG tablet Take 1 tablet (1 mg total) by mouth daily. 11/16/21   Roxan Hockey, MD  Multiple Vitamin (MULTIVITAMIN WITH MINERALS) TABS tablet Take 1 tablet by mouth daily. 11/16/21   Roxan Hockey, MD  nicotine (NICODERM CQ - DOSED IN MG/24 HOURS) 21 mg/24hr patch Place 1 patch (21 mg total) onto the skin daily. 11/16/21   Roxan Hockey, MD  ondansetron (ZOFRAN) 4 MG tablet Take 1 tablet (4 mg total) by mouth every 6 (six) hours as needed for nausea. 11/15/21   Roxan Hockey, MD  oxyCODONE (ROXICODONE) 5 MG immediate release tablet Take 1 tablet (5 mg total) by mouth every 6 (six) hours as needed for up to 5 doses for severe pain. 07/17/22   Wyvonnia Dusky, MD  pantoprazole (PROTONIX) 40 MG tablet Take 1 tablet (40 mg total) by mouth  daily. 11/15/21 11/15/22  Roxan Hockey, MD  polyethylene glycol (MIRALAX / GLYCOLAX) 17 g packet Take 17 g by mouth daily as needed for mild constipation. 11/15/21   Roxan Hockey, MD  potassium chloride SA (KLOR-CON M) 20 MEQ tablet Take one tablet bid x 3 days, then take one tablet once a day 11/15/21   Roxan Hockey, MD  thiamine 100 MG tablet Take 1 tablet (100 mg total) by mouth daily. 11/16/21   Roxan Hockey, MD      Allergies    Patient has no known allergies.    Review of Systems   Review of Systems  Gastrointestinal:  Positive for abdominal pain.    Physical Exam Updated Vital Signs BP (!) 112/59   Pulse (!) 102   Temp 97.8 F (36.6 C) (Oral)   Resp 18   Ht '5\' 4"'$  (1.626 m)   Wt 56.7 kg   SpO2 100%   BMI 21.46 kg/m  Physical Exam Vitals and nursing note reviewed.  HENT:     Head: Normocephalic and atraumatic.     Mouth/Throat:     Mouth: Mucous membranes are moist.  Eyes:     General: Scleral icterus present.        Right eye: No discharge.        Left eye: No discharge.     Conjunctiva/sclera: Conjunctivae normal.  Cardiovascular:  Rate and Rhythm: Regular rhythm. Tachycardia present.     Pulses: Normal pulses.          Radial pulses are 2+ on the right side and 2+ on the left side.     Heart sounds: Normal heart sounds.  Pulmonary:     Effort: Pulmonary effort is normal.     Breath sounds: Normal breath sounds.  Abdominal:     General: Abdomen is flat.     Palpations: Abdomen is soft.     Tenderness: There is abdominal tenderness in the right upper quadrant, epigastric area and left upper quadrant. There is no right CVA tenderness or left CVA tenderness.  Skin:    General: Skin is warm and dry.  Neurological:     General: No focal deficit present.  Psychiatric:        Mood and Affect: Mood normal.     ED Results / Procedures / Treatments   Labs (all labs ordered are listed, but only abnormal results are displayed) Labs Reviewed   CBC WITH DIFFERENTIAL/PLATELET  COMPREHENSIVE METABOLIC PANEL  LIPASE, BLOOD  URINALYSIS, ROUTINE W REFLEX MICROSCOPIC  PREGNANCY, URINE    EKG None  Radiology No results found.  Procedures .Critical Care  Performed by: Harriet Pho, PA-C Authorized by: Harriet Pho, PA-C   Critical care provider statement:    Critical care time (minutes):  30   Critical care was necessary to treat or prevent imminent or life-threatening deterioration of the following conditions:  Renal failure   Critical care was time spent personally by me on the following activities:  Development of treatment plan with patient or surrogate, discussions with consultants, evaluation of patient's response to treatment, examination of patient, ordering and review of laboratory studies, ordering and review of radiographic studies, ordering and performing treatments and interventions, pulse oximetry, re-evaluation of patient's condition, review of old charts and blood draw for specimens   {Document cardiac monitor, telemetry assessment procedure when appropriate:1}  Medications Ordered in ED Medications  HYDROmorphone (DILAUDID) injection 1 mg (has no administration in time range)  sodium chloride 0.9 % bolus 1,000 mL (has no administration in time range)  ondansetron (ZOFRAN) injection 4 mg (has no administration in time range)    ED Course/ Medical Decision Making/ A&P                           Medical Decision Making Amount and/or Complexity of Data Reviewed Labs: ordered. Radiology: ordered.  Risk Prescription drug management.   Initial Impression and Ddx *** Patient PMH that increases complexity of ED encounter:  ***  Interpretation of Diagnostics I independent reviewed and interpreted the labs as followed: ***  - I independently visualized the following imaging with scope of interpretation limited to determining acute life threatening conditions related to emergency care: ***, which  revealed ***  Patient Reassessment and Ultimate Disposition/Management ***  Patient management required discussion with the following services or consulting groups:  {BEROCONSULT:26841}  Complexity of Problems Addressed {BEROCOPA:26833}  Additional Data Reviewed and Analyzed Further history obtained from: {BERODATA:26834}  Patient Encounter Risk Assessment {BERORISK:26838}   {Document critical care time when appropriate:1} {Document review of labs and clinical decision tools ie heart score, Chads2Vasc2 etc:1}  {Document your independent review of radiology images, and any outside records:1} {Document your discussion with family members, caretakers, and with consultants:1} {Document social determinants of health affecting pt's care:1} {Document your decision making why or why not admission, treatments were  needed:1} Final Clinical Impression(s) / ED Diagnoses Final diagnoses:  None    Rx / DC Orders ED Discharge Orders     None

## 2022-10-30 NOTE — ED Notes (Signed)
Pt taken to ct 

## 2022-10-30 NOTE — ED Notes (Signed)
Date and time results received: 10/30/22 1310 (use smartphrase ".now" to insert current time)  Test: hgb Critical Value: 6.9  Name of Provider Notified: dr zammit  Orders Received? Or Actions Taken?: Orders Received - See Orders for details

## 2022-10-31 ENCOUNTER — Inpatient Hospital Stay (HOSPITAL_COMMUNITY): Payer: Medicaid Other

## 2022-10-31 DIAGNOSIS — R6510 Systemic inflammatory response syndrome (SIRS) of non-infectious origin without acute organ dysfunction: Secondary | ICD-10-CM

## 2022-10-31 DIAGNOSIS — K7011 Alcoholic hepatitis with ascites: Secondary | ICD-10-CM | POA: Diagnosis not present

## 2022-10-31 DIAGNOSIS — D649 Anemia, unspecified: Secondary | ICD-10-CM

## 2022-10-31 DIAGNOSIS — R188 Other ascites: Secondary | ICD-10-CM

## 2022-10-31 DIAGNOSIS — K7031 Alcoholic cirrhosis of liver with ascites: Secondary | ICD-10-CM | POA: Diagnosis not present

## 2022-10-31 DIAGNOSIS — K7290 Hepatic failure, unspecified without coma: Secondary | ICD-10-CM | POA: Insufficient documentation

## 2022-10-31 DIAGNOSIS — E871 Hypo-osmolality and hyponatremia: Secondary | ICD-10-CM

## 2022-10-31 DIAGNOSIS — N179 Acute kidney failure, unspecified: Principal | ICD-10-CM

## 2022-10-31 DIAGNOSIS — K7460 Unspecified cirrhosis of liver: Secondary | ICD-10-CM

## 2022-10-31 LAB — HEPATIC FUNCTION PANEL
ALT: 16 U/L (ref 0–44)
AST: 83 U/L — ABNORMAL HIGH (ref 15–41)
Albumin: 2.7 g/dL — ABNORMAL LOW (ref 3.5–5.0)
Alkaline Phosphatase: 181 U/L — ABNORMAL HIGH (ref 38–126)
Bilirubin, Direct: 8.3 mg/dL — ABNORMAL HIGH (ref 0.0–0.2)
Indirect Bilirubin: 5.5 mg/dL — ABNORMAL HIGH (ref 0.3–0.9)
Total Bilirubin: 13.8 mg/dL — ABNORMAL HIGH (ref 0.3–1.2)
Total Protein: 6.8 g/dL (ref 6.5–8.1)

## 2022-10-31 LAB — BASIC METABOLIC PANEL
Anion gap: 21 — ABNORMAL HIGH (ref 5–15)
BUN: 66 mg/dL — ABNORMAL HIGH (ref 6–20)
CO2: 16 mmol/L — ABNORMAL LOW (ref 22–32)
Calcium: 6.6 mg/dL — ABNORMAL LOW (ref 8.9–10.3)
Chloride: 91 mmol/L — ABNORMAL LOW (ref 98–111)
Creatinine, Ser: 7.57 mg/dL — ABNORMAL HIGH (ref 0.44–1.00)
GFR, Estimated: 6 mL/min — ABNORMAL LOW (ref >60)
Glucose, Bld: 95 mg/dL (ref 70–99)
Potassium: 3.3 mmol/L — ABNORMAL LOW (ref 3.5–5.1)
Sodium: 128 mmol/L — ABNORMAL LOW (ref 135–145)

## 2022-10-31 LAB — AFP TUMOR MARKER: AFP, Serum, Tumor Marker: 4.7 ng/mL (ref 0.0–6.4)

## 2022-10-31 LAB — PROTIME-INR
INR: 1.4 — ABNORMAL HIGH (ref 0.8–1.2)
Prothrombin Time: 17.1 seconds — ABNORMAL HIGH (ref 11.4–15.2)

## 2022-10-31 LAB — PROCALCITONIN: Procalcitonin: 15.33 ng/mL

## 2022-10-31 MED ORDER — ONDANSETRON HCL 4 MG/2ML IJ SOLN
4.0000 mg | Freq: Four times a day (QID) | INTRAMUSCULAR | Status: DC | PRN
  Administered 2022-10-31 – 2022-11-07 (×6): 4 mg via INTRAVENOUS
  Filled 2022-10-31 (×7): qty 2

## 2022-10-31 MED ORDER — SODIUM CHLORIDE 0.9 % IV SOLN: INTRAVENOUS | Status: AC

## 2022-10-31 MED ORDER — ALBUMIN HUMAN 25 % IV SOLN
50.0000 g | Freq: Three times a day (TID) | INTRAVENOUS | Status: AC
  Administered 2022-10-31: 50 g via INTRAVENOUS
  Filled 2022-10-31: qty 200

## 2022-10-31 MED ORDER — NICOTINE 14 MG/24HR TD PT24
14.0000 mg | MEDICATED_PATCH | Freq: Every day | TRANSDERMAL | Status: DC
  Administered 2022-11-01 – 2022-11-08 (×8): 14 mg via TRANSDERMAL
  Filled 2022-10-31 (×8): qty 1

## 2022-10-31 MED ORDER — LACTULOSE 10 GM/15ML PO SOLN
20.0000 g | Freq: Three times a day (TID) | ORAL | Status: DC
  Administered 2022-10-31 – 2022-11-01 (×4): 20 g via ORAL
  Filled 2022-10-31 (×4): qty 30

## 2022-10-31 MED ORDER — SODIUM CHLORIDE 0.9 % IV BOLUS
1000.0000 mL | Freq: Once | INTRAVENOUS | Status: AC
  Administered 2022-10-31: 1000 mL via INTRAVENOUS

## 2022-10-31 NOTE — Progress Notes (Addendum)
Patient admitted this shift after one unit or blood given in ED. Patient finish receiving her last two of Four bottles of Albumin this morning at 0430 they where pulled by nurse in ED. Two new IV obtained via ultrasound. Second unit of blood started, currently no complaints from infusion. Continuing to monitor patient.

## 2022-10-31 NOTE — Progress Notes (Addendum)
[facility] PROGRESS NOTE    Progress Note  Mallory Mendoza  WUJ:[id number] DOB: 07-29-1981 DOA: 10/30/2022 PCP: Carrolyn Meiers, MD     Brief Narrative:   Mallory Mendoza is an 41 y.o. female past medical history significant for alcohol abuse, alcoholic cirrhosis with ongoing alcohol use she drinks about a pint of liquor every day comes in for nausea vomiting abdominal pain that started 2 days prior to admission, CT scan of the abdomen and pelvis showed hypodense nondilated gallbladder, general surgery was consulted and relates she does not have acute cholecystitis  Assessment/Plan:   SIRS: Check a procalcitonin. Started empirically on IV Rocephin, urine cultures and blood cultures have been sent. Recheck a CBC with differential in the morning. Also there is a probability about spontaneous bacterial peritonitis, she is currently on Protonix  Unlikely a urinary tract infection, she denies any dysuria.  Or urinary symptoms. Abdominal ultrasound showed trace ascites.  Acute renal failure (ARF) (HCC) Likely prerenal azotemia due to poor oral intake, was given a liter of normal saline continue IV fluids. It is stenosis she was transfused 1 unit of packed red blood cells which helps with volume expansion. Basic metabolic panel this morning is pending.  Hypovolemic hyponatremia: Likely due to decreased oral intake. She was started on aggressive fluid resuscitation basic metabolic panels pending this morning.  Acute normocytic anemia: Of unclear etiology she denies any overt bleeding. She is currently getting 2 units of packed red blood cells. Check a CBC posttransfusion. Start empirically on Protonix and octreotide. GI was consulted recommended Protonix and IV octreotide.  Decompensated alcoholic cirrhosis: With a MELD score 3.0 of 37. She has new leukocytosis which could be reactive. Urinalysis and chest x-ray were checked and she was started empirically on IV  Rocephin. GI would like her infectious workup to be negative before starting her on steroids. Alpha-fetoprotein has been ordered, she was started empirically on lactulose. Check a procalcitonin.  Hypoalbuminemia: GI recommended IV albumin  Alcohol abuse Last drink was 5 days prior to admission, continue thiamine and folate continue monitor with CIWA protocol.   DVT prophylaxis: scd Family Communication:none Status is: Inpatient Remains inpatient appropriate because: abd pain, Nausea and vomiting    Code Status:  Code Status History     Date Active Date Inactive Code Status Order ID Comments User Context   11/13/2021 1717 11/15/2021 2238 Full Code [id number]  Bethena Roys, MD Inpatient   06/11/2014 1444 06/12/2014 1851 Full Code [id number]  Lisette Abu, PA-C Inpatient         IV Access:   Peripheral IV   Procedures and diagnostic studies:   DG CHEST PORT 1 VIEW  Result Date: 10/30/2022 CLINICAL DATA:  Shortness of breath, abdominal pain, nausea, vomiting, history cirrhosis EXAM: PORTABLE CHEST 1 VIEW COMPARISON:  Portable exam 1605 hours compared to 11/13/2021 FINDINGS: Normal heart size, mediastinal contours, and pulmonary vascularity. Minimal RIGHT basilar atelectasis. Lungs otherwise clear. No pulmonary infiltrate, pleural effusion, or pneumothorax. Osseous structures unremarkable. IMPRESSION: Minimal RIGHT basilar atelectasis. Electronically Signed   By: Lavonia Dana M.D.   On: 10/30/2022 16:21   US Abdomen Limited RUQ (LIVER/GB)  Result Date: 10/30/2022 CLINICAL DATA:  RIGHT upper quadrant abdominal pain, history cirrhosis EXAM: ULTRASOUND ABDOMEN LIMITED RIGHT UPPER QUADRANT COMPARISON:  CT abdomen and pelvis 10/30/2022 FINDINGS: Gallbladder: Contracted, with thickened wall, nonspecific finding in the setting of ascites. No shadowing calculi or sonographic Murphy sign. Common bile duct: Diameter: 3 mm, normal Liver: Heterogeneous increased  echogenicity of  liver consistent with fatty infiltration seen on prior CT. No hepatic mass or nodularity. No intrahepatic biliary dilatation. Portal vein is patent on color Doppler imaging with normal direction of blood flow towards the liver. Other: Scattered ascites. IMPRESSION: Ascites. Thickened gallbladder wall, a nonspecific finding in the setting of ascites. Fatty infiltration of liver. Electronically Signed   By: Lavonia Dana M.D.   On: 10/30/2022 16:03   CT ABDOMEN PELVIS WO CONTRAST  Result Date: 10/30/2022 CLINICAL DATA:  Chronic abdominal pain with nausea and vomiting. History of cirrhosis. EXAM: CT ABDOMEN AND PELVIS WITHOUT CONTRAST TECHNIQUE: Multidetector CT imaging of the abdomen and pelvis was performed following the standard protocol without IV contrast. RADIATION DOSE REDUCTION: This exam was performed according to the departmental dose-optimization program which includes automated exposure control, adjustment of the mA and/or kV according to patient size and/or use of iterative reconstruction technique. COMPARISON:  CT November 13, 2021. FINDINGS: Lower chest: Motion degraded examination of the lungs reveals no acute abnormality. Hepatobiliary: Heterogeneous appearance of the hepatic parenchyma with interposed geographic areas of hepatic parenchymal hypodensity and hyperdensity. Hyperdense material in a nondilated gallbladder likely reflects sludge and stones. Gallbladder wall thickening with trace pericholecystic fluid is favored reactive. No biliary ductal dilation. Pancreas: No pancreatic ductal dilation or evidence of acute inflammation. Spleen: No splenomegaly. Adrenals/Urinary Tract: Bilateral adrenal glands are within normal limits. No hydronephrosis. No renal, ureteral or bladder calculi. Stomach/Bowel: Stomach is unremarkable for degree of distension. No pathologic dilation of small or large bowel. Wall thickening of the ascending colon through the hepatic flexure favored portal colopathy.  Vascular/Lymphatic: Aortic atherosclerosis. Smooth IVC contours. No pathologically enlarged abdominal or pelvic lymph nodes. Reproductive: Uterus and bilateral adnexa are unremarkable. Other: Small volume of abdominopelvic ascites. Diffuse body wall edema. Musculoskeletal: No acute osseous abnormality. IMPRESSION: 1. Heterogeneous appearance of the hepatic parenchyma with interposed geographic areas of hepatic parenchymal hypodensity and hyperdensity. Findings are favored to reflect hepatic steatosis with areas of focal fatty sparing. However, consider further evaluation with nonemergent hepatic protocol MRI with and without contrast for more definitive characterization. 2. Hyperdense material in a nondilated gallbladder likely reflects sludge and stones. Gallbladder wall thickening with trace pericholecystic fluid is favored reactive. Consider further evaluation with right upper quadrant ultrasound. 3. Wall thickening of the ascending colon through the hepatic flexure favored to reflect portal colopathy. 4. Small volume of abdominopelvic ascites compatible with portal hypertension. 5.  Aortic Atherosclerosis (ICD10-I70.0). Electronically Signed   By: Dahlia Bailiff M.D.   On: 10/30/2022 14:52     Medical Consultants:   None.   Subjective:    Mallory Mendoza relates she feels better this morning able to drink water was to try clear liquid diet.  Objective:    Vitals:   10/30/22 2332 10/31/22 0015 10/31/22 0510 10/31/22 0540  BP: 119/76 121/81 104/65 119/71  Pulse: 94 89 96 96  Resp: '18 14 17 17  '$ Temp:  97.9 F (36.6 C) 99.2 F (37.3 C) 98.5 F (36.9 C)  TempSrc:  Oral Oral Oral  SpO2: 97% 100% 100% 100%  Weight:      Height:       SpO2: 100 %   Intake/Output Summary (Last 24 hours) at 10/31/2022 0722 Last data filed at 10/31/2022 0512 Gross per 24 hour  Intake 1477.74 ml  Output --  Net 1477.74 ml   Filed Weights   10/30/22 1107  Weight: 56.7 kg    Exam: General exam: In  no acute  distress. Respiratory system: Good air movement and clear to auscultation. Cardiovascular system: S1 & S2 heard, RRR. No JVD. Gastrointestinal system: Positive bowel sounds soft nontender nondistended Extremities: No pedal edema. Skin: No rashes, lesions or ulcers Psychiatry: Judgement and insight appear normal. Mood & affect appropriate.    Data Reviewed:    Labs: Basic Metabolic Panel: Recent Labs  Lab 10/30/22 1200 10/30/22 1344  NA 128*  --   K 4.6  --   CL 85*  --   CO2 21*  --   GLUCOSE 103*  --   BUN 63*  --   CREATININE 8.87*  --   CALCIUM 7.1*  --   MG  --  2.4   GFR Estimated Creatinine Clearance: 7.2 mL/min (A) (by C-G formula based on SCr of 8.87 mg/dL (H)). Liver Function Tests: Recent Labs  Lab 10/30/22 1200  AST 117*  ALT 22  ALKPHOS 199*  BILITOT 11.9*  PROT 6.3*  ALBUMIN 1.8*   Recent Labs  Lab 10/30/22 1200  LIPASE 35   Recent Labs  Lab 10/30/22 2030  AMMONIA 78*   Coagulation profile Recent Labs  Lab 10/30/22 1344  INR 1.6*   COVID-19 Labs  No results for input(s): "DDIMER", "FERRITIN", "LDH", "CRP" in the last 72 hours.  Lab Results  Component Value Date   SARSCOV2NAA NEGATIVE 11/13/2021   [place] NEGATIVE 03/18/2021    CBC: Recent Labs  Lab 10/30/22 1200  WBC 16.6*  NEUTROABS 12.6*  HGB 6.9*  HCT 20.7*  MCV 104.0*  PLT 210   Cardiac Enzymes: No results for input(s): "CKTOTAL", "CKMB", "CKMBINDEX", "TROPONINI" in the last 168 hours. BNP (last 3 results) No results for input(s): "PROBNP" in the last 8760 hours. CBG: No results for input(s): "GLUCAP" in the last 168 hours. D-Dimer: No results for input(s): "DDIMER" in the last 72 hours. Hgb A1c: No results for input(s): "HGBA1C" in the last 72 hours. Lipid Profile: No results for input(s): "CHOL", "HDL", "LDLCALC", "TRIG", "CHOLHDL", "LDLDIRECT" in the last 72 hours. Thyroid function studies: No results for input(s): "TSH", "T4TOTAL", "T3FREE",  "THYROIDAB" in the last 72 hours.  Invalid input(s): "FREET3" Anemia work up: No results for input(s): "VITAMINB12", "FOLATE", "FERRITIN", "TIBC", "IRON", "RETICCTPCT" in the last 72 hours. Sepsis Labs: Recent Labs  Lab 10/30/22 1200  WBC 16.6*   Microbiology No results found for this or any previous visit (from the past 240 hour(s)).   Medications:    lactulose  20 g Oral BID   nicotine  14 mg Transdermal Once   pantoprazole (PROTONIX) IV  40 mg Intravenous Q12H   ursodiol  300 mg Oral TID   Continuous Infusions:  albumin human 50 g (10/31/22 0050)   cefTRIAXone (ROCEPHIN)  IV     octreotide (SANDOSTATIN) 500 mcg in sodium chloride 0.9 % 250 mL (2 mcg/mL) infusion 50 mcg/hr (10/30/22 2105)      LOS: 1 day   Charlynne Cousins  [facility]  10/31/2022, 7:22 AM

## 2022-10-31 NOTE — TOC Progression Note (Signed)
  Transition of Care [facility]) Screening Note   Patient Details  Name: Mallory Mendoza Date of Birth: 1981/10/21   Transition of Care [facility]) CM/SW Contact:    Boneta Lucks, RN Phone Number: 10/31/2022, 12:01 PM    Transition of Care Department [facility]) has reviewed patient and no TOC needs have been identified at this time. We will continue to monitor patient advancement through interdisciplinary progression rounds. If new patient transition needs arise, please place a TOC consult.    Expected Discharge Plan: Home/Self Care Barriers to Discharge: Continued Medical Work up  Expected Discharge Plan and Services Expected Discharge Plan: Home/Self Care

## 2022-10-31 NOTE — Progress Notes (Signed)
Mallory Mendoza, M.D. Gastroenterology & Hepatology   Interval History:  No acute events overnight. Patient feels better compared to yesterday as she does not have too much abdominal pain and her nausea is better, has tolerated clear liquid diet without vomiting episodes or nausea. No fever or chills.   Labs today showed low sodium 11/23/2026, potassium 3.3, creatinine still elevated 7.57, BUN 66, AST 83, ALT 16, albumin 2.7, alkaline phosphatase 181, total bilirubin 13.8, CBC with WBC 16.6, hemoglobin 10.9 (responded to blood transfusion), platelets 126. INR 1.4.  Inpatient Medications:  Current Facility-Administered Medications:    0.9 %  sodium chloride infusion, , Intravenous, Continuous, Charlynne Cousins, MD, Last Rate: 75 mL/hr at 10/31/22 0832, New Bag at 10/31/22 0832   albumin human 25 % solution 50 g, 50 g, Intravenous, Q8H, Charlynne Cousins, MD   cefTRIAXone (ROCEPHIN) 1 g in sodium chloride 0.9 % 100 mL IVPB, 1 g, Intravenous, Q24H, Stinson, Jacob J, DO, Last Rate: 200 mL/hr at 10/31/22 0919, 1 g at 10/31/22 0919   HYDROmorphone (DILAUDID) injection 0.5 mg, 0.5 mg, Intravenous, Q3H PRN, Truett Mainland, DO, 0.5 mg at 10/31/22 2130   lactulose (CHRONULAC) 10 GM/15ML solution 20 g, 20 g, Oral, TID, Charlynne Cousins, MD, 20 g at 10/31/22 0916   nicotine (NICODERM CQ - dosed in mg/24 hours) patch 14 mg, 14 mg, Transdermal, Once, Truett Mainland, DO, 14 mg at 10/30/22 2350   [COMPLETED] octreotide (SANDOSTATIN) 2 mcg/mL load via infusion 50 mcg, 50 mcg, Intravenous, Once, 50 mcg at 10/30/22 1702 **AND** octreotide (SANDOSTATIN) 500 mcg in sodium chloride 0.9 % 250 mL (2 mcg/mL) infusion, 50 mcg/hr, Intravenous, Continuous, Mahon, Courtney L, NP, Last Rate: 25 mL/hr at 10/31/22 1046, 50 mcg/hr at 10/31/22 1046   pantoprazole (PROTONIX) injection 40 mg, 40 mg, Intravenous, Q12H, Stinson, Jacob J, DO, 40 mg at 10/31/22 0916   ursodiol (ACTIGALL) capsule 300 mg, 300 mg, Oral,  TID, Montez Morita, [place] Retana, MD, 300 mg at 10/31/22 1046   I/O    Intake/Output Summary (Last 24 hours) at 10/31/2022 1217 Last data filed at 10/31/2022 0824 Gross per 24 hour  Intake 1861.74 ml  Output --  Net 1861.74 ml     Physical Exam: Temp:  [97.6 F (36.4 C)-99.2 F (37.3 C)] 97.6 F (36.4 C) (12/16 0830) Pulse Rate:  [89-101] 94 (12/16 0830) Resp:  [12-20] 18 (12/16 0830) BP: (100-123)/(58-81) 109/65 (12/16 0830) SpO2:  [96 %-100 %] 99 % (12/16 0830)  Temp (24hrs), Avg:98.1 F (36.7 C), Min:97.6 F (36.4 C), Max:99.2 F (37.3 C) GENERAL: The patient is AO x3, in no acute distress. HEENT: Head is normocephalic and atraumatic. EOMI are intact. Mouth is well hydrated and without lesions. Has scleral icterus. NECK: Supple. No masses LUNGS: Clear to auscultation. No presence of rhonchi/wheezing/rales. Adequate chest expansion HEART: RRR, normal s1 and s2. ABDOMEN: Soft, nontender, no guarding, no peritoneal signs. Has mild distention in abdomen. BS +. No masses. EXTREMITIES: Without any cyanosis, clubbing, rash, lesions.  Trace edema in both extremities. NEUROLOGIC: AOx3, no focal motor deficit.  No asterixis. SKIN: no jaundice, no rashes  Laboratory Data: CBC:     Component Value Date/Time   WBC 16.6 (H) 10/31/2022 1004   RBC 3.29 (L) 10/31/2022 1004   HGB 10.9 (L) 10/31/2022 1004   HCT 31.4 (L) 10/31/2022 1004   PLT 126 (L) 10/31/2022 1004   MCV 95.4 10/31/2022 1004   MCH 33.1 10/31/2022 1004   MCHC 34.7 10/31/2022 1004  RDW 21.7 (H) 10/31/2022 1004   LYMPHSABS 1.4 10/31/2022 1004   MONOABS 1.0 10/31/2022 1004   EOSABS 0.2 10/31/2022 1004   BASOSABS 0.1 10/31/2022 1004   COAG:  Lab Results  Component Value Date   INR 1.6 (H) 10/30/2022   INR 0.89 06/11/2014    BMP:     Latest Ref Rng & Units 10/31/2022   10:04 AM 10/30/2022   12:00 PM 11/15/2021    5:19 AM  BMP  Glucose 70 - 99 mg/dL 95  103  84   BUN 6 - 20 mg/dL 66  63  <5   Creatinine  0.44 - 1.00 mg/dL 7.57  8.87  0.30   Sodium 135 - 145 mmol/L 128  128  137   Potassium 3.5 - 5.1 mmol/L 3.3  4.6  3.0   Chloride 98 - 111 mmol/L 91  85  103   CO2 22 - 32 mmol/L _0 Calcium 8.9 - 10.3 mg/dL 6.6  7.1  8.4     HEPATIC:     Latest Ref Rng & Units 10/30/2022   12:00 PM 11/15/2021    5:19 AM 11/14/2021    5:42 AM  Hepatic Function  Total Protein 6.5 - 8.1 g/dL 6.3   5.7   Albumin 3.5 - 5.0 g/dL 1.8  3.1  3.3   AST 15 - 41 U/L 117   92   ALT 0 - 44 U/L 22   21   Alk Phosphatase 38 - 126 U/L 199   60   Total Bilirubin 0.3 - 1.2 mg/dL 11.9   1.5     CARDIAC: No results found for: "CKTOTAL", "CKMB", "CKMBINDEX", "TROPONINI"    Imaging: I personally reviewed and interpreted the available labs, imaging and endoscopic files.   Assessment/Plan: Mallory Mendoza is a 41 y.o. year old female with history of anxiety, depression, and cirrhosis secondary to chronic alcohol  and possible PBC, who came to the hospital with worsening abdominal pain, jaundice, nausea and vomiting.  The patient had major derangements in her liver function test in the setting of chronic alcohol abuse.  Her presentation likely corresponds to acute alcoholic hepatitis in the setting of newly diagnosed cirrhosis.  Etiology of cirrhosis seems to be a combination of alcohol abuse but also possible PBC.  She had presence of mild ascites but no presence of significant third spacing.  No presence of encephalopathy.  MDF elevated 32.7 but unfortunately she is not a candidate for steroids for now until infection has been ruled out.  Will need to follow urine culture and blood culture results, especially as her procalcitonin was markedly elevated.  Will need to check daily MELD labs.  Given her new onset AKI, she will benefit from albumin 50 g 3 times daily for further repletion of the intravascular component.  Unclear if this is related to hepatorenal syndrome.  Will appreciate nephrology input.  Regarding her  constipation, even though she is not presenting encephalopathy, will benefit from continuing lactulose for now.  Will continue URSO 300 mg 3 times daily for PBC.  Finally, the patient had some coffee-ground emesis with severe anemia.    Will consider performing an inpatient EGD once she is more stable.  For now she will continue PPI twice daily IV.  MELD 3.0 - 36  - Daily MELD labs - Ursodiol 300 mg TID - Pantoprazole 40 mg BID - GI soft diet - Follow Ucx and Bcx - Nephrology consult -  albumin 50 g TID IV - Lactulose 20 g TID - Alcohol cessation  Mallory Peppers, MD Gastroenterology and [facility]

## 2022-11-01 ENCOUNTER — Encounter (HOSPITAL_COMMUNITY): Payer: Self-pay | Admitting: Family Medicine

## 2022-11-01 DIAGNOSIS — K7290 Hepatic failure, unspecified without coma: Secondary | ICD-10-CM | POA: Diagnosis not present

## 2022-11-01 DIAGNOSIS — K7031 Alcoholic cirrhosis of liver with ascites: Secondary | ICD-10-CM | POA: Diagnosis not present

## 2022-11-01 DIAGNOSIS — K7011 Alcoholic hepatitis with ascites: Secondary | ICD-10-CM

## 2022-11-01 DIAGNOSIS — D649 Anemia, unspecified: Secondary | ICD-10-CM | POA: Diagnosis not present

## 2022-11-01 LAB — CBC WITH DIFFERENTIAL/PLATELET
Abs Immature Granulocytes: 0.65 10*3/uL — ABNORMAL HIGH (ref 0.00–0.07)
Basophils Absolute: 0.1 10*3/uL (ref 0.0–0.1)
Basophils Relative: 1 %
Eosinophils Absolute: 0.1 10*3/uL (ref 0.0–0.5)
Eosinophils Relative: 1 %
HCT: 28.3 % — ABNORMAL LOW (ref 36.0–46.0)
Hemoglobin: 9.8 g/dL — ABNORMAL LOW (ref 12.0–15.0)
Immature Granulocytes: 4 %
Lymphocytes Relative: 9 %
Lymphs Abs: 1.4 10*3/uL (ref 0.7–4.0)
MCH: 32.3 pg (ref 26.0–34.0)
MCHC: 34.6 g/dL (ref 30.0–36.0)
MCV: 93.4 fL (ref 80.0–100.0)
Monocytes Absolute: 0.7 10*3/uL (ref 0.1–1.0)
Monocytes Relative: 4 %
Neutro Abs: 12.7 10*3/uL — ABNORMAL HIGH (ref 1.7–7.7)
Neutrophils Relative %: 81 %
Platelets: 134 10*3/uL — ABNORMAL LOW (ref 150–400)
RBC: 3.03 MIL/uL — ABNORMAL LOW (ref 3.87–5.11)
RDW: 22.7 % — ABNORMAL HIGH (ref 11.5–15.5)
WBC: 15 10*3/uL — ABNORMAL HIGH (ref 4.0–10.5)
nRBC: 0 % (ref 0.0–0.2)

## 2022-11-01 LAB — COMPREHENSIVE METABOLIC PANEL
ALT: 15 U/L (ref 0–44)
AST: 74 U/L — ABNORMAL HIGH (ref 15–41)
Albumin: 2.7 g/dL — ABNORMAL LOW (ref 3.5–5.0)
Alkaline Phosphatase: 171 U/L — ABNORMAL HIGH (ref 38–126)
Anion gap: 16 — ABNORMAL HIGH (ref 5–15)
BUN: 65 mg/dL — ABNORMAL HIGH (ref 6–20)
CO2: 19 mmol/L — ABNORMAL LOW (ref 22–32)
Calcium: 6.5 mg/dL — ABNORMAL LOW (ref 8.9–10.3)
Chloride: 98 mmol/L (ref 98–111)
Creatinine, Ser: 5.84 mg/dL — ABNORMAL HIGH (ref 0.44–1.00)
GFR, Estimated: 9 mL/min — ABNORMAL LOW (ref >60)
Glucose, Bld: 96 mg/dL (ref 70–99)
Potassium: 2.9 mmol/L — ABNORMAL LOW (ref 3.5–5.1)
Sodium: 133 mmol/L — ABNORMAL LOW (ref 135–145)
Total Bilirubin: 13.3 mg/dL — ABNORMAL HIGH (ref 0.3–1.2)
Total Protein: 6.5 g/dL (ref 6.5–8.1)

## 2022-11-01 LAB — URINE CULTURE: Culture: NO GROWTH

## 2022-11-01 LAB — TYPE AND SCREEN
ABO/RH(D): AB POS
Antibody Screen: NEGATIVE
Unit division: 0
Unit division: 0
Unit division: 0

## 2022-11-01 LAB — SODIUM, URINE, RANDOM: Sodium, Ur: 13 mmol/L

## 2022-11-01 LAB — PROTIME-INR
INR: 1.6 — ABNORMAL HIGH (ref 0.8–1.2)
Prothrombin Time: 18.7 seconds — ABNORMAL HIGH (ref 11.4–15.2)

## 2022-11-01 LAB — CREATININE, URINE, RANDOM: Creatinine, Urine: 161.01 mg/dL

## 2022-11-01 LAB — BPAM RBC
Blood Product Expiration Date: 202312292359
Blood Product Expiration Date: 202401062359
Blood Product Expiration Date: 202401062359
ISSUE DATE / TIME: 202312151500
ISSUE DATE / TIME: 202312152054
ISSUE DATE / TIME: 202312160504
Unit Type and Rh: 600
Unit Type and Rh: 6200
Unit Type and Rh: 6200

## 2022-11-01 LAB — AMMONIA: Ammonia: 100 umol/L — ABNORMAL HIGH (ref 9–35)

## 2022-11-01 MED ORDER — POTASSIUM CHLORIDE CRYS ER 20 MEQ PO TBCR
20.0000 meq | EXTENDED_RELEASE_TABLET | Freq: Two times a day (BID) | ORAL | Status: AC
  Administered 2022-11-01 (×2): 20 meq via ORAL
  Filled 2022-11-01 (×2): qty 1

## 2022-11-01 MED ORDER — SODIUM CHLORIDE 0.9 % IV SOLN: INTRAVENOUS | Status: DC

## 2022-11-01 MED ORDER — ALBUMIN HUMAN 25 % IV SOLN
25.0000 g | Freq: Four times a day (QID) | INTRAVENOUS | Status: DC
  Administered 2022-11-01 – 2022-11-02 (×3): 25 g via INTRAVENOUS
  Filled 2022-11-01 (×3): qty 100

## 2022-11-01 MED ORDER — LACTULOSE 10 GM/15ML PO SOLN
30.0000 g | Freq: Three times a day (TID) | ORAL | Status: DC
  Administered 2022-11-01 (×2): 30 g via ORAL
  Filled 2022-11-01 (×3): qty 60

## 2022-11-01 MED ORDER — SODIUM CHLORIDE 0.9 % IV BOLUS: 2000.0000 mL | Freq: Once | INTRAVENOUS | Status: DC

## 2022-11-01 NOTE — Progress Notes (Signed)
Per mother patient has been vomiting all day long, this nurse asked to see it but have not, mother states every time she drinks the Lactulose she vomits.

## 2022-11-01 NOTE — Progress Notes (Signed)
[facility] PROGRESS NOTE    Progress Note  Mallory Mendoza  WJX:[id number] DOB: 1981/08/12 DOA: 10/30/2022 PCP: Carrolyn Meiers, MD     Brief Narrative:   Mallory Mendoza is an 41 y.o. female past medical history significant for alcohol abuse, alcoholic cirrhosis with ongoing alcohol use she drinks about a pint of liquor every day comes in for nausea vomiting abdominal pain that started 2 days prior to admission, CT scan of the abdomen and pelvis showed hypodense nondilated gallbladder, general surgery was consulted and relates she does not have acute cholecystitis  Assessment/Plan:   SIRS: Pro-Cal significantly elevated. Continue IV Rocephin, urine cultures and blood cultures have been negative till date. Leukocytosis is slowly improving. Also there is a probability about spontaneous bacterial peritonitis, she is currently on Protonix  Unlikely a urinary tract infection. Abdominal ultrasound showed trace ascites.  Acute renal failure (ARF) (HCC) Likely prerenal azotemia due to poor oral intake, was given a liter of normal saline continue IV fluids. Continue aggressive IV fluid hydration creatinine is slowly improving monitor strict I's and O's and daily weights. She has had good urine output 2.2 L in the last 24 hours.  Decompensated alcoholic cirrhosis/alcoholic hepatitis: With a MELD score 3.0 of 37. GI has been consulted, they relate she is not a candidate for steroids at this point in time.  Until infection has been ruled out. UA does not show signs of infection, blood cultures remain negative to date. GI would like her infectious workup to be negative before starting her on steroids. Alpha-fetoprotein unremarkable. Procalcitonin is 15. Check an ammonia level she is on lactulose.  She has no asterixis  Hypovolemic hyponatremia: Likely due to decreased oral intake.  This morning she is tolerating her diet. Improving with fluid resuscitation.  Acute  normocytic anemia: Of unclear etiology she denies any overt bleeding. She is currently getting 2 units of packed red blood cells. Hemoglobin posttransfusion was 9.8. Continue Protonix and octreotide.  Hypoalbuminemia: GI recommended IV albumin  Alcohol abuse Last drink was 5 days prior to admission, continue thiamine and folate continue monitor with CIWA protocol.   DVT prophylaxis: scd Family Communication:none Status is: Inpatient Remains inpatient appropriate because: abd pain, Nausea and vomiting    Code Status:  Code Status History     Date Active Date Inactive Code Status Order ID Comments User Context   11/13/2021 1717 11/15/2021 2238 Full Code [id number]  Bethena Roys, MD Inpatient   06/11/2014 1444 06/12/2014 1851 Full Code [id number]  Lisette Abu, PA-C Inpatient         IV Access:   Peripheral IV   Procedures and diagnostic studies:   DG CHEST PORT 1 VIEW  Result Date: 10/30/2022 CLINICAL DATA:  Shortness of breath, abdominal pain, nausea, vomiting, history cirrhosis EXAM: PORTABLE CHEST 1 VIEW COMPARISON:  Portable exam 1605 hours compared to 11/13/2021 FINDINGS: Normal heart size, mediastinal contours, and pulmonary vascularity. Minimal RIGHT basilar atelectasis. Lungs otherwise clear. No pulmonary infiltrate, pleural effusion, or pneumothorax. Osseous structures unremarkable. IMPRESSION: Minimal RIGHT basilar atelectasis. Electronically Signed   By: Lavonia Dana M.D.   On: 10/30/2022 16:21   US Abdomen Limited RUQ (LIVER/GB)  Result Date: 10/30/2022 CLINICAL DATA:  RIGHT upper quadrant abdominal pain, history cirrhosis EXAM: ULTRASOUND ABDOMEN LIMITED RIGHT UPPER QUADRANT COMPARISON:  CT abdomen and pelvis 10/30/2022 FINDINGS: Gallbladder: Contracted, with thickened wall, nonspecific finding in the setting of ascites. No shadowing calculi or sonographic Murphy sign. Common bile duct: Diameter: 3 mm,  normal Liver: Heterogeneous increased  echogenicity of liver consistent with fatty infiltration seen on prior CT. No hepatic mass or nodularity. No intrahepatic biliary dilatation. Portal vein is patent on color Doppler imaging with normal direction of blood flow towards the liver. Other: Scattered ascites. IMPRESSION: Ascites. Thickened gallbladder wall, a nonspecific finding in the setting of ascites. Fatty infiltration of liver. Electronically Signed   By: Lavonia Dana M.D.   On: 10/30/2022 16:03   CT ABDOMEN PELVIS WO CONTRAST  Result Date: 10/30/2022 CLINICAL DATA:  Chronic abdominal pain with nausea and vomiting. History of cirrhosis. EXAM: CT ABDOMEN AND PELVIS WITHOUT CONTRAST TECHNIQUE: Multidetector CT imaging of the abdomen and pelvis was performed following the standard protocol without IV contrast. RADIATION DOSE REDUCTION: This exam was performed according to the departmental dose-optimization program which includes automated exposure control, adjustment of the mA and/or kV according to patient size and/or use of iterative reconstruction technique. COMPARISON:  CT November 13, 2021. FINDINGS: Lower chest: Motion degraded examination of the lungs reveals no acute abnormality. Hepatobiliary: Heterogeneous appearance of the hepatic parenchyma with interposed geographic areas of hepatic parenchymal hypodensity and hyperdensity. Hyperdense material in a nondilated gallbladder likely reflects sludge and stones. Gallbladder wall thickening with trace pericholecystic fluid is favored reactive. No biliary ductal dilation. Pancreas: No pancreatic ductal dilation or evidence of acute inflammation. Spleen: No splenomegaly. Adrenals/Urinary Tract: Bilateral adrenal glands are within normal limits. No hydronephrosis. No renal, ureteral or bladder calculi. Stomach/Bowel: Stomach is unremarkable for degree of distension. No pathologic dilation of small or large bowel. Wall thickening of the ascending colon through the hepatic flexure favored portal  colopathy. Vascular/Lymphatic: Aortic atherosclerosis. Smooth IVC contours. No pathologically enlarged abdominal or pelvic lymph nodes. Reproductive: Uterus and bilateral adnexa are unremarkable. Other: Small volume of abdominopelvic ascites. Diffuse body wall edema. Musculoskeletal: No acute osseous abnormality. IMPRESSION: 1. Heterogeneous appearance of the hepatic parenchyma with interposed geographic areas of hepatic parenchymal hypodensity and hyperdensity. Findings are favored to reflect hepatic steatosis with areas of focal fatty sparing. However, consider further evaluation with nonemergent hepatic protocol MRI with and without contrast for more definitive characterization. 2. Hyperdense material in a nondilated gallbladder likely reflects sludge and stones. Gallbladder wall thickening with trace pericholecystic fluid is favored reactive. Consider further evaluation with right upper quadrant ultrasound. 3. Wall thickening of the ascending colon through the hepatic flexure favored to reflect portal colopathy. 4. Small volume of abdominopelvic ascites compatible with portal hypertension. 5.  Aortic Atherosclerosis (ICD10-I70.0). Electronically Signed   By: Dahlia Bailiff M.D.   On: 10/30/2022 14:52     Medical Consultants:   None.   Subjective:    [name] patient feels little bit better this morning able to eat and drink, her speech is kind of slow as well as her responses  Objective:    Vitals:   10/31/22 0830 10/31/22 1440 10/31/22 2125 11/01/22 0422  BP: 109/65 138/89 (!) 143/84 (!) 145/92  Pulse: 94 92 95 95  Resp: '18 17 16 17  '$ Temp: 97.6 F (36.4 C) 98.1 F (36.7 C) 98 F (36.7 C) 97.8 F (36.6 C)  TempSrc: Oral Oral    SpO2: 99% 100% 98% 100%  Weight:      Height:       SpO2: 100 %   Intake/Output Summary (Last 24 hours) at 11/01/2022 0904 Last data filed at 11/01/2022 0816 Gross per 24 hour  Intake 2325.08 ml  Output --  Net 2325.08 ml    [employer]  10/30/22 1107  Weight: 56.7 kg    Exam: General exam: In no acute distress. Respiratory system: Good air movement and clear to auscultation. Cardiovascular system: S1 & S2 heard, RRR. No JVD. Gastrointestinal system: Abdomen is nondistended, soft and nontender.  Extremities: No pedal edema. Skin: No rashes, lesions or ulcers Psychiatry: Judgement and insight appear normal. Mood & affect appropriate.   Data Reviewed:    Labs: Basic Metabolic Panel: Recent Labs  Lab 10/30/22 1200 10/30/22 1344 10/31/22 1004 11/01/22 0654  NA 128*  --  128* 133*  K 4.6  --  3.3* 2.9*  CL 85*  --  91* 98  CO2 21*  --  16* 19*  GLUCOSE 103*  --  95 96  BUN 63*  --  66* 65*  CREATININE 8.87*  --  7.57* 5.84*  CALCIUM 7.1*  --  6.6* 6.5*  MG  --  2.4  --   --     GFR Estimated Creatinine Clearance: 10.9 mL/min (A) (by C-G formula based on SCr of 5.84 mg/dL (H)). Liver Function Tests: Recent Labs  Lab 10/30/22 1200 10/31/22 1346 11/01/22 0654  AST 117* 83* 74*  ALT '22 16 15  '$ ALKPHOS 199* 181* 171*  BILITOT 11.9* 13.8* 13.3*  PROT 6.3* 6.8 6.5  ALBUMIN 1.8* 2.7* 2.7*    Recent Labs  Lab 10/30/22 1200  LIPASE 35    Recent Labs  Lab 10/30/22 2030  AMMONIA 78*    Coagulation profile Recent Labs  Lab 10/30/22 1344 10/31/22 1346 11/01/22 0654  INR 1.6* 1.4* 1.6*    COVID-19 Labs  No results for input(s): "DDIMER", "FERRITIN", "LDH", "CRP" in the last 72 hours.  Lab Results  Component Value Date   SARSCOV2NAA NEGATIVE 11/13/2021   [place] NEGATIVE 03/18/2021    CBC: Recent Labs  Lab 10/30/22 1200 10/31/22 1004 11/01/22 0654  WBC 16.6* 16.6* 15.0*  NEUTROABS 12.6* 13.4* 12.7*  HGB 6.9* 10.9* 9.8*  HCT 20.7* 31.4* 28.3*  MCV 104.0* 95.4 93.4  PLT 210 126* 134*    Cardiac Enzymes: No results for input(s): "CKTOTAL", "CKMB", "CKMBINDEX", "TROPONINI" in the last 168 hours. BNP (last 3 results) No results for input(s): "PROBNP" in the last 8760  hours. CBG: No results for input(s): "GLUCAP" in the last 168 hours. D-Dimer: No results for input(s): "DDIMER" in the last 72 hours. Hgb A1c: No results for input(s): "HGBA1C" in the last 72 hours. Lipid Profile: No results for input(s): "CHOL", "HDL", "LDLCALC", "TRIG", "CHOLHDL", "LDLDIRECT" in the last 72 hours. Thyroid function studies: No results for input(s): "TSH", "T4TOTAL", "T3FREE", "THYROIDAB" in the last 72 hours.  Invalid input(s): "FREET3" Anemia work up: No results for input(s): "VITAMINB12", "FOLATE", "FERRITIN", "TIBC", "IRON", "RETICCTPCT" in the last 72 hours. Sepsis Labs: Recent Labs  Lab 10/30/22 1200 10/31/22 1004 11/01/22 0654  PROCALCITON  --  15.33  --   WBC 16.6* 16.6* 15.0*    Microbiology Recent Results (from the past 240 hour(s))  Culture, blood (Routine X 2) w Reflex to ID Panel     Status: None (Preliminary result)   Collection Time: 10/31/22 10:04 AM   Specimen: BLOOD RIGHT ARM  Result Value Ref Range Status   Specimen Description   Final    BLOOD RIGHT ARM BOTTLES DRAWN AEROBIC AND ANAEROBIC   Special Requests   Final    Blood Culture adequate volume Performed at [facility], [address], [place], [place] [address]    Culture PENDING  Incomplete   Report Status  PENDING  Incomplete  Culture, blood (Routine X 2) w Reflex to ID Panel     Status: None (Preliminary result)   Collection Time: 10/31/22 10:04 AM   Specimen: BLOOD RIGHT HAND  Result Value Ref Range Status   Specimen Description   Final    BLOOD RIGHT HAND BOTTLES DRAWN AEROBIC AND ANAEROBIC   Special Requests   Final    Blood Culture adequate volume Performed at [facility], [address] [place], [place] [address]    Culture PENDING  Incomplete   Report Status PENDING  Incomplete     Medications:    lactulose  20 g Oral TID   nicotine  14 mg Transdermal Daily   pantoprazole (PROTONIX) IV  40 mg Intravenous Q12H   ursodiol  300 mg Oral TID   Continuous  Infusions:  sodium chloride     cefTRIAXone (ROCEPHIN)  IV 200 mL/hr at 11/01/22 0816   octreotide (SANDOSTATIN) 500 mcg in sodium chloride 0.9 % 250 mL (2 mcg/mL) infusion 50 mcg/hr (11/01/22 0816)      LOS: 2 days   Charlynne Cousins  [facility]  11/01/2022, 9:04 AM

## 2022-11-01 NOTE — Progress Notes (Signed)
Mallory Mendoza, M.D. Gastroenterology & Hepatology   Interval History:  No acute events overnight. Family (sister) is at the bedside who requested updates regarding her sister's medical status. Patient reports feeling slightly better although she did vomit once yesterday.  Denies any abdominal pain her abdominal is slightly more distended today.  No bowel movement so far. Labs today showed potassium 2.9, sodium 133, creatinine 5.84, calcium 6.5, BUN 65, AST 74, ALT 15, alkaline phosphatase 171, total bili 13.3, WBC 15,000, hemoglobin 9.8, platelets 134, INR 1.6. Notably, urine culture was negative and blood cultures have been negative for the last 24 hours.  Inpatient Medications:  Current Facility-Administered Medications:    0.9 %  sodium chloride infusion, , Intravenous, Continuous, Charlynne Cousins, MD   cefTRIAXone (ROCEPHIN) 1 g in sodium chloride 0.9 % 100 mL IVPB, 1 g, Intravenous, Q24H, Truett Mainland, DO, Last Rate: 200 mL/hr at 11/01/22 0816, Infusion Verify at 11/01/22 0816   HYDROmorphone (DILAUDID) injection 0.5 mg, 0.5 mg, Intravenous, Q3H PRN, Truett Mainland, DO, 0.5 mg at 11/01/22 0434   lactulose (CHRONULAC) 10 GM/15ML solution 20 g, 20 g, Oral, TID, Charlynne Cousins, MD, 20 g at 10/31/22 2139   nicotine (NICODERM CQ - dosed in mg/24 hours) patch 14 mg, 14 mg, Transdermal, Daily, Stinson, Jacob J, DO   [COMPLETED] octreotide (SANDOSTATIN) 2 mcg/mL load via infusion 50 mcg, 50 mcg, Intravenous, Once, 50 mcg at 10/30/22 1702 **AND** octreotide (SANDOSTATIN) 500 mcg in sodium chloride 0.9 % 250 mL (2 mcg/mL) infusion, 50 mcg/hr, Intravenous, Continuous, Mahon, Courtney L, NP, Last Rate: 25 mL/hr at 11/01/22 0816, 50 mcg/hr at 11/01/22 0816   ondansetron (ZOFRAN) injection 4 mg, 4 mg, Intravenous, Q6H PRN, Charlynne Cousins, MD, 4 mg at 10/31/22 1612   pantoprazole (PROTONIX) injection 40 mg, 40 mg, Intravenous, Q12H, Stinson, Jacob J, DO, 40 mg at 10/31/22 2138    sodium chloride 0.9 % bolus 2,000 mL, 2,000 mL, Intravenous, Once, Charlynne Cousins, MD   ursodiol (ACTIGALL) capsule 300 mg, 300 mg, Oral, TID, Montez Morita, Burnetta Kohls, MD, 300 mg at 10/31/22 2139   I/O    Intake/Output Summary (Last 24 hours) at 11/01/2022 0912 Last data filed at 11/01/2022 0816 Gross per 24 hour  Intake 2325.08 ml  Output --  Net 2325.08 ml     Physical Exam: Temp:  [97.8 F (36.6 C)-98.1 F (36.7 C)] 97.8 F (36.6 C) (12/17 0422) Pulse Rate:  [92-95] 95 (12/17 0422) Resp:  [16-17] 17 (12/17 0422) BP: (138-145)/(84-92) 145/92 (12/17 0422) SpO2:  [98 %-100 %] 100 % (12/17 0422)  Temp (24hrs), Avg:98 F (36.7 C), Min:97.8 F (36.6 C), Max:98.1 F (36.7 C) GENERAL: The patient is AO x3, in no acute distress. HEENT: Head is normocephalic and atraumatic. EOMI are intact. Mouth is well hydrated and without lesions. Has scleral icterus. NECK: Supple. No masses LUNGS: Clear to auscultation. No presence of rhonchi/wheezing/rales. Adequate chest expansion HEART: RRR, normal s1 and s2. ABDOMEN: Soft, nontender, no guarding, no peritoneal signs. Has moderate distention in abdomen but not tense. BS +. No masses. EXTREMITIES: Without any cyanosis, clubbing, rash, lesions.  Trace edema in both extremities. NEUROLOGIC: AOx3, no focal motor deficit.  No asterixis. SKIN: no jaundice, no rashes  Laboratory Data: CBC:     Component Value Date/Time   WBC 15.0 (H) 11/01/2022 0654   RBC 3.03 (L) 11/01/2022 0654   HGB 9.8 (L) 11/01/2022 0654   HCT 28.3 (L) 11/01/2022 0654   PLT 134 (L)  11/01/2022 0654   MCV 93.4 11/01/2022 0654   MCH 32.3 11/01/2022 0654   MCHC 34.6 11/01/2022 0654   RDW 22.7 (H) 11/01/2022 0654   LYMPHSABS 1.4 11/01/2022 0654   MONOABS 0.7 11/01/2022 0654   EOSABS 0.1 11/01/2022 0654   BASOSABS 0.1 11/01/2022 0654   COAG:  Lab Results  Component Value Date   INR 1.6 (H) 11/01/2022   INR 1.4 (H) 10/31/2022   INR 1.6 (H) 10/30/2022     BMP:     Latest Ref Rng & Units 11/01/2022    6:54 AM 10/31/2022   10:04 AM 10/30/2022   12:00 PM  BMP  Glucose 70 - 99 mg/dL 96  95  103   BUN 6 - 20 mg/dL 65  66  63   Creatinine 0.44 - 1.00 mg/dL 5.84  7.57  8.87   Sodium 135 - 145 mmol/L 133  128  128   Potassium 3.5 - 5.1 mmol/L 2.9  3.3  4.6   Chloride 98 - 111 mmol/L 98  91  85   CO2 22 - 32 mmol/L _0 Calcium 8.9 - 10.3 mg/dL 6.5  6.6  7.1     HEPATIC:     Latest Ref Rng & Units 11/01/2022    6:54 AM 10/31/2022    1:46 PM 10/30/2022   12:00 PM  Hepatic Function  Total Protein 6.5 - 8.1 g/dL 6.5  6.8  6.3   Albumin 3.5 - 5.0 g/dL 2.7  2.7  1.8   AST 15 - 41 U/L 74  83  117   ALT 0 - 44 U/L _1 Alk Phosphatase 38 - 126 U/L 171  181  199   Total Bilirubin 0.3 - 1.2 mg/dL 13.3  13.8  11.9   Bilirubin, Direct 0.0 - 0.2 mg/dL  8.3      CARDIAC: No results found for: "CKTOTAL", "CKMB", "CKMBINDEX", "TROPONINI"    Imaging: I personally reviewed and interpreted the available labs, imaging and endoscopic files.   Assessment/Plan: Mallory Mendoza is a 41 y.o. year old female with history of anxiety, depression, and cirrhosis secondary to chronic alcohol  and possible PBC, who came to the hospital with worsening abdominal pain, jaundice, nausea and vomiting.  The patient had major derangements in her liver function test in the setting of chronic alcohol abuse.  Her presentation likely corresponds to acute alcoholic hepatitis in the setting of newly diagnosed cirrhosis.  Etiology of cirrhosis seems to be a combination of alcohol abuse but also possible PBC.  She had presence of mild ascites but no presence of significant third spacing amenable for paracentesis at the moment -however, if she were to have worsening abdominal distention, will repeat ultrasound to determine if she requires a therapeutic/diagnostic paracentesis.  No presence of encephalopathy.  MDF elevated 39.5 but unfortunately she is not a candidate  for steroids for now until infection has been ruled out (procalcitonin was markedly elevated).  Urine culture was negative and blood culture has been negative for the last 24 hours.  Will need to check daily MELD labs.   Given her new onset AKI, she will benefit from continuing albumin 50 g 3 times daily for further repletion of the intravascular component.  Unclear if this is related to hepatorenal syndrome. Per hospitalist, nephrology considered this was related to prerenal azotemia and will continue same plan (no official note is in the medical chart).   Regarding  her constipation, even though she is not presenting encephalopathy, will benefit from increasing lactulose for now.   Will continue URSO 300 mg 3 times daily for PBC.   Finally, the patient had some coffee-ground emesis with severe anemia.  Will consider performing an inpatient EGD once she is more stable.  For now she will continue PPI twice daily IV. Currently also on octreotide drip.   MELD 3.0 - 36   - Daily MELD labs - Ursodiol 300 mg TID - Pantoprazole 40 mg BID - Octreotide drip - GI soft diet - Follow Bcx result - albumin 50 g TID IV - Lactulose 30 g TID - Alcohol cessation  Mallory Peppers, MD Gastroenterology and [facility]

## 2022-11-02 ENCOUNTER — Inpatient Hospital Stay (HOSPITAL_COMMUNITY): Payer: Medicaid Other

## 2022-11-02 DIAGNOSIS — K7460 Unspecified cirrhosis of liver: Secondary | ICD-10-CM

## 2022-11-02 DIAGNOSIS — F1010 Alcohol abuse, uncomplicated: Secondary | ICD-10-CM | POA: Diagnosis not present

## 2022-11-02 DIAGNOSIS — K7031 Alcoholic cirrhosis of liver with ascites: Secondary | ICD-10-CM | POA: Diagnosis not present

## 2022-11-02 DIAGNOSIS — K7290 Hepatic failure, unspecified without coma: Secondary | ICD-10-CM

## 2022-11-02 DIAGNOSIS — K7011 Alcoholic hepatitis with ascites: Secondary | ICD-10-CM | POA: Diagnosis not present

## 2022-11-02 DIAGNOSIS — D649 Anemia, unspecified: Secondary | ICD-10-CM | POA: Diagnosis not present

## 2022-11-02 LAB — COMPREHENSIVE METABOLIC PANEL
ALT: 17 U/L (ref 0–44)
AST: 76 U/L — ABNORMAL HIGH (ref 15–41)
Albumin: 3.6 g/dL (ref 3.5–5.0)
Alkaline Phosphatase: 155 U/L — ABNORMAL HIGH (ref 38–126)
Anion gap: 16 — ABNORMAL HIGH (ref 5–15)
BUN: 59 mg/dL — ABNORMAL HIGH (ref 6–20)
CO2: 18 mmol/L — ABNORMAL LOW (ref 22–32)
Calcium: 7.5 mg/dL — ABNORMAL LOW (ref 8.9–10.3)
Chloride: 104 mmol/L (ref 98–111)
Creatinine, Ser: 3.78 mg/dL — ABNORMAL HIGH (ref 0.44–1.00)
GFR, Estimated: 15 mL/min — ABNORMAL LOW (ref >60)
Glucose, Bld: 107 mg/dL — ABNORMAL HIGH (ref 70–99)
Potassium: 2.5 mmol/L — CL (ref 3.5–5.1)
Sodium: 138 mmol/L (ref 135–145)
Total Bilirubin: 14.7 mg/dL — ABNORMAL HIGH (ref 0.3–1.2)
Total Protein: 7.2 g/dL (ref 6.5–8.1)

## 2022-11-02 LAB — PROTIME-INR
INR: 1.7 — ABNORMAL HIGH (ref 0.8–1.2)
Prothrombin Time: 20 seconds — ABNORMAL HIGH (ref 11.4–15.2)

## 2022-11-02 LAB — AMMONIA: Ammonia: 134 umol/L — ABNORMAL HIGH (ref 9–35)

## 2022-11-02 LAB — MAGNESIUM: Magnesium: 1.8 mg/dL (ref 1.7–2.4)

## 2022-11-02 MED ORDER — LACTULOSE 10 GM/15ML PO SOLN: 10.0000 g | Freq: Every day | ORAL | Status: DC

## 2022-11-02 MED ORDER — ONDANSETRON HCL 4 MG/2ML IJ SOLN
4.0000 mg | Freq: Four times a day (QID) | INTRAMUSCULAR | Status: DC
  Administered 2022-11-02 – 2022-11-08 (×19): 4 mg via INTRAVENOUS
  Filled 2022-11-02 (×21): qty 2

## 2022-11-02 MED ORDER — POTASSIUM CHLORIDE IN NACL 40-0.9 MEQ/L-% IV SOLN: INTRAVENOUS | Status: AC

## 2022-11-02 NOTE — Progress Notes (Signed)
[facility] PROGRESS NOTE    Progress Note  Mallory Mendoza  WCB:[id number] DOB: [birth year] DOA: 10/30/2022 PCP: Carrolyn Meiers, MD     Brief Narrative:   Mallory Mendoza is an 41 y.o. female past medical history significant for alcohol abuse, alcoholic cirrhosis with ongoing alcohol use she drinks about a pint of liquor every day comes in for nausea vomiting abdominal pain that started 2 days prior to admission, CT scan of the abdomen and pelvis showed hypodense nondilated gallbladder, general surgery was consulted and relates she does not have acute cholecystitis  Assessment/Plan:   SIRS: Continue IV Rocephin, urine cultures and blood cultures have been negative till date. Leukocytosis is slowly improving. Likely due to alcoholic hepatitis. Abdominal ultrasound showed trace ascites.  Acute renal failure (ARF) (HCC) Likely prerenal azotemia due to poor oral intake. Creatinine is significant improved continue IV fluids for an additional 24 hours. She continue to have good urine output. Strict I's and O's.  Decompensated alcoholic cirrhosis/alcoholic hepatitis: With a MELD score 3.0 of 37. GI has been consulted, infection has been ruled out.  Workup for infectious etiology has been negative. UA does not show signs of infection, blood cultures remain negative to date. Elevation in procalcitonin to be probably due to SIRS reaction but I cannot find a source of infection Her ammonia level is trending up continue lactulose.  Hypovolemic hyponatremia: Resolved with fluid resuscitation.  Acute normocytic anemia: Of unclear etiology she denies any overt bleeding. She is currently getting 2 units of packed red blood cells. Hemoglobin posttransfusion was 9.8. Continue Protonix and octreotide.  Hypokalemia: 2.5 this morning, change her IV fluids with supplementation.  Hypoalbuminemia: GI recommended IV albumin  Alcohol abuse Last drink was 5 days prior to  admission, continue thiamine and folate continue monitor with CIWA protocol.   DVT prophylaxis: scd Family Communication:none Status is: Inpatient Remains inpatient appropriate because: abd pain, Nausea and vomiting    Code Status:  Code Status History     Date Active Date Inactive Code Status Order ID Comments User Context   11/13/2021 1717 11/15/2021 2238 Full Code [id number]  Bethena Roys, MD Inpatient   06/11/2014 1444 06/12/2014 1851 Full Code [id number]  Lisette Abu, PA-C Inpatient         IV Access:   Peripheral IV   Procedures and diagnostic studies:   No results found.   Medical Consultants:   None.   Subjective:    Mallory Mendoza  feels about the same as yesterday fever to eat and drink.  Objective:    Vitals:   11/01/22 0422 11/01/22 1414 11/01/22 2034 11/02/22 0413  BP: (!) 145/92 108/73 126/75 133/83  Pulse: 95 96 98 91  Resp: '17 20 16 18  '$ Temp: 97.8 F (36.6 C) 98.1 F (36.7 C) 98.7 F (37.1 C) 98.4 F (36.9 C)  TempSrc:  Oral    SpO2: 100% 100% 100% 100%  Weight:      Height:       SpO2: 100 %   Intake/Output Summary (Last 24 hours) at 11/02/2022 0829 Last data filed at 11/02/2022 0500 Gross per 24 hour  Intake 2598.54 ml  Output --  Net 2598.54 ml    Filed Weights   10/30/22 1107  Weight: 56.7 kg    Exam: General exam: In no acute distress. Respiratory system: Good air movement and clear to auscultation. Cardiovascular system: S1 & S2 heard, RRR. No JVD. Gastrointestinal system: Abdomen is nondistended, soft and nontender.  Extremities: No pedal edema. Skin: No rashes, lesions or ulcers Psychiatry: Judgement and insight appear normal. Mood & affect appropriate.   Data Reviewed:    Labs: Basic Metabolic Panel: Recent Labs  Lab 10/30/22 1200 10/30/22 1344 10/31/22 1004 11/01/22 0654 11/02/22 0604  NA 128*  --  128* 133* 138  K 4.6  --  3.3* 2.9* 2.5*  CL 85*  --  91* 98 104  CO2 21*  --   16* 19* 18*  GLUCOSE 103*  --  95 96 107*  BUN 63*  --  66* 65* 59*  CREATININE 8.87*  --  7.57* 5.84* 3.78*  CALCIUM 7.1*  --  6.6* 6.5* 7.5*  MG  --  2.4  --   --   --     GFR Estimated Creatinine Clearance: 16.9 mL/min (A) (by C-G formula based on SCr of 3.78 mg/dL (H)). Liver Function Tests: Recent Labs  Lab 10/30/22 1200 10/31/22 1346 11/01/22 0654 11/02/22 0604  AST 117* 83* 74* 76*  ALT '22 16 15 17  '$ ALKPHOS 199* 181* 171* 155*  BILITOT 11.9* 13.8* 13.3* 14.7*  PROT 6.3* 6.8 6.5 7.2  ALBUMIN 1.8* 2.7* 2.7* 3.6    Recent Labs  Lab 10/30/22 1200  LIPASE 35    Recent Labs  Lab 10/30/22 2030 11/01/22 0924  AMMONIA 78* 100*    Coagulation profile Recent Labs  Lab 10/30/22 1344 10/31/22 1346 11/01/22 0654 11/02/22 0604  INR 1.6* 1.4* 1.6* 1.7*    COVID-19 Labs  No results for input(s): "DDIMER", "FERRITIN", "LDH", "CRP" in the last 72 hours.  Lab Results  Component Value Date   SARSCOV2NAA NEGATIVE 11/13/2021   [place] NEGATIVE 03/18/2021    CBC: Recent Labs  Lab 10/30/22 1200 10/31/22 1004 11/01/22 0654  WBC 16.6* 16.6* 15.0*  NEUTROABS 12.6* 13.4* 12.7*  HGB 6.9* 10.9* 9.8*  HCT 20.7* 31.4* 28.3*  MCV 104.0* 95.4 93.4  PLT 210 126* 134*    Cardiac Enzymes: No results for input(s): "CKTOTAL", "CKMB", "CKMBINDEX", "TROPONINI" in the last 168 hours. BNP (last 3 results) No results for input(s): "PROBNP" in the last 8760 hours. CBG: No results for input(s): "GLUCAP" in the last 168 hours. D-Dimer: No results for input(s): "DDIMER" in the last 72 hours. Hgb A1c: No results for input(s): "HGBA1C" in the last 72 hours. Lipid Profile: No results for input(s): "CHOL", "HDL", "LDLCALC", "TRIG", "CHOLHDL", "LDLDIRECT" in the last 72 hours. Thyroid function studies: No results for input(s): "TSH", "T4TOTAL", "T3FREE", "THYROIDAB" in the last 72 hours.  Invalid input(s): "FREET3" Anemia work up: No results for input(s): "VITAMINB12",  "FOLATE", "FERRITIN", "TIBC", "IRON", "RETICCTPCT" in the last 72 hours. Sepsis Labs: Recent Labs  Lab 10/30/22 1200 10/31/22 1004 11/01/22 0654  PROCALCITON  --  15.33  --   WBC 16.6* 16.6* 15.0*    Microbiology Recent Results (from the past 240 hour(s))  Urine Culture     Status: None   Collection Time: 10/30/22  7:38 PM   Specimen: Urine, Clean Catch  Result Value Ref Range Status   Specimen Description   Final    URINE, CLEAN CATCH Performed at [facility], [address], [place], [place] [address]    Special Requests   Final    NONE Performed at [facility], [address], [place], [place] [address]    Culture   Final    NO GROWTH Performed at [facility], [place] [address] [place], [place] [address]    Report Status 11/01/2022  FINAL  Final  Culture, blood (Routine X 2) w Reflex to ID Panel     Status: None (Preliminary result)   Collection Time: 10/31/22 10:04 AM   Specimen: BLOOD RIGHT ARM  Result Value Ref Range Status   Specimen Description   Final    BLOOD RIGHT ARM BOTTLES DRAWN AEROBIC AND ANAEROBIC   Special Requests Blood Culture adequate volume  Final   Culture   Final    NO GROWTH 2 DAYS Performed at [facility], [address] [place], [place] [address]    Report Status PENDING  Incomplete  Culture, blood (Routine X 2) w Reflex to ID Panel     Status: None (Preliminary result)   Collection Time: 10/31/22 10:04 AM   Specimen: BLOOD RIGHT HAND  Result Value Ref Range Status   Specimen Description   Final    BLOOD RIGHT HAND BOTTLES DRAWN AEROBIC AND ANAEROBIC   Special Requests Blood Culture adequate volume  Final   Culture   Final    NO GROWTH 2 DAYS Performed at [facility], [address] [place], [place] [address]    Report Status PENDING  Incomplete     Medications:    lactulose  30 g Oral TID   nicotine  14 mg Transdermal Daily   pantoprazole (PROTONIX) IV  40 mg Intravenous Q12H   ursodiol  300 mg Oral TID   Continuous  Infusions:  0.9 % NaCl with KCl 40 mEq / L     albumin human 25 g (11/02/22 0404)   cefTRIAXone (ROCEPHIN)  IV Stopped (11/01/22 0842)   octreotide (SANDOSTATIN) 500 mcg in sodium chloride 0.9 % 250 mL (2 mcg/mL) infusion 50 mcg/hr (11/01/22 1803)      LOS: 3 days   Charlynne Cousins  [facility]  11/02/2022, 8:29 AM

## 2022-11-02 NOTE — Progress Notes (Signed)
Pt has had two large BM's saturating the bed, pt, and the entire floor this shift. Family present in the room, states that pt will not voice pain and needs something for her pain and nausea. Pain medication and nausea medication administered. Pt refused oral lactulose this shift and has been unable to keep anything down PO. GI informed.

## 2022-11-02 NOTE — Progress Notes (Signed)
Lab called with critical K+ of 2.5, MD notified.

## 2022-11-02 NOTE — Progress Notes (Signed)
Patient is still vomiting periodically and is unable to keep down meds. especially Lactulose . She was able to rest after IV dilaudid. Receiving albumin and PO potassium.

## 2022-11-02 NOTE — Progress Notes (Signed)
Pt had 22 G IV in R AC that is red and arm is edamatous. This RN removed IV and placed a 22 in Left AC. New IV flushes well and provided good blood return. Pt is a difficult IV stick. Pt remains confused and is A&O to self and place. She is very slow to respond  to questions. Bryson Corona Edd Fabian

## 2022-11-02 NOTE — Progress Notes (Addendum)
Gastroenterology Progress Note    Primary Care Physician:  Carrolyn Meiers, MD Primary Gastroenterologist:  Dr. Abbey Chatters   Patient ID: Mallory Mendoza; [id number]; November 24, 1980    Subjective   Alert. Unable to assess orientation as does not respond to my questions regarding this. She states she is in no pain. States she is "ok". Limited answers. Sitting up in bed and starting into space but makes eye contact when requested.    Objective   Vital signs in last 24 hours Temp:  [98.1 F (36.7 C)-98.7 F (37.1 C)] 98.4 F (36.9 C) (12/18 0413) Pulse Rate:  [91-98] 91 (12/18 0413) Resp:  [16-20] 18 (12/18 0413) BP: (108-133)/(73-83) 133/83 (12/18 0413) SpO2:  [100 %] 100 % (12/18 0413) Last BM Date : 11/02/22  Physical Exam General:   Alert and oriented, flat affect. jaundiced Head:  Normocephalic and atraumatic. Abdomen:  Bowel sounds present, distended, tense, no rebound or guarding  Msk:  Symmetrical without gross deformities. Normal posture. Extremities:  Without edema. Neurologic:  Alert and  oriented to person only. Unable to assess other orientation as patient does not respond. Mild asterixis   Intake/Output from previous day: 12/17 0701 - 12/18 0700 In: 3180.5 [P.O.:240; I.V.:2699.3; IV Piggyback:241.2] Out: -  Intake/Output this shift: No intake/output data recorded.  Lab Results  Recent Labs    10/31/22 1004 11/01/22 0654  WBC 16.6* 15.0*  HGB 10.9* 9.8*  HCT 31.4* 28.3*  PLT 126* 134*   BMET Recent Labs    10/31/22 1004 11/01/22 0654 11/02/22 0604  NA 128* 133* 138  K 3.3* 2.9* 2.5*  CL 91* 98 104  CO2 16* 19* 18*  GLUCOSE 95 96 107*  BUN 66* 65* 59*  CREATININE 7.57* 5.84* 3.78*  CALCIUM 6.6* 6.5* 7.5*   LFT Recent Labs    10/31/22 1346 11/01/22 0654 11/02/22 0604  PROT 6.8 6.5 7.2  ALBUMIN 2.7* 2.7* 3.6  AST 83* 74* 76*  ALT '16 15 17  '$ ALKPHOS 181* 171* 155*  BILITOT 13.8* 13.3* 14.7*  BILIDIR 8.3*  --   --   IBILI 5.5*  --    --    PT/INR Recent Labs    11/01/22 0654 11/02/22 0604  LABPROT 18.7* 20.0*  INR 1.6* 1.7*    Studies/Results DG CHEST PORT 1 VIEW  Result Date: 10/30/2022 CLINICAL DATA:  Shortness of breath, abdominal pain, nausea, vomiting, history cirrhosis EXAM: PORTABLE CHEST 1 VIEW COMPARISON:  Portable exam 1605 hours compared to 11/13/2021 FINDINGS: Normal heart size, mediastinal contours, and pulmonary vascularity. Minimal RIGHT basilar atelectasis. Lungs otherwise clear. No pulmonary infiltrate, pleural effusion, or pneumothorax. Osseous structures unremarkable. IMPRESSION: Minimal RIGHT basilar atelectasis. Electronically Signed   By: Lavonia Dana M.D.   On: 10/30/2022 16:21   US Abdomen Limited RUQ (LIVER/GB)  Result Date: 10/30/2022 CLINICAL DATA:  RIGHT upper quadrant abdominal pain, history cirrhosis EXAM: ULTRASOUND ABDOMEN LIMITED RIGHT UPPER QUADRANT COMPARISON:  CT abdomen and pelvis 10/30/2022 FINDINGS: Gallbladder: Contracted, with thickened wall, nonspecific finding in the setting of ascites. No shadowing calculi or sonographic Murphy sign. Common bile duct: Diameter: 3 mm, normal Liver: Heterogeneous increased echogenicity of liver consistent with fatty infiltration seen on prior CT. No hepatic mass or nodularity. No intrahepatic biliary dilatation. Portal vein is patent on color Doppler imaging with normal direction of blood flow towards the liver. Other: Scattered ascites. IMPRESSION: Ascites. Thickened gallbladder wall, a nonspecific finding in the setting of ascites. Fatty infiltration of liver. Electronically Signed   By:  Lavonia Dana M.D.   On: 10/30/2022 16:03   CT ABDOMEN PELVIS WO CONTRAST  Result Date: 10/30/2022 CLINICAL DATA:  Chronic abdominal pain with nausea and vomiting. History of cirrhosis. EXAM: CT ABDOMEN AND PELVIS WITHOUT CONTRAST TECHNIQUE: Multidetector CT imaging of the abdomen and pelvis was performed following the standard protocol without IV contrast.  RADIATION DOSE REDUCTION: This exam was performed according to the departmental dose-optimization program which includes automated exposure control, adjustment of the mA and/or kV according to patient size and/or use of iterative reconstruction technique. COMPARISON:  CT November 13, 2021. FINDINGS: Lower chest: Motion degraded examination of the lungs reveals no acute abnormality. Hepatobiliary: Heterogeneous appearance of the hepatic parenchyma with interposed geographic areas of hepatic parenchymal hypodensity and hyperdensity. Hyperdense material in a nondilated gallbladder likely reflects sludge and stones. Gallbladder wall thickening with trace pericholecystic fluid is favored reactive. No biliary ductal dilation. Pancreas: No pancreatic ductal dilation or evidence of acute inflammation. Spleen: No splenomegaly. Adrenals/Urinary Tract: Bilateral adrenal glands are within normal limits. No hydronephrosis. No renal, ureteral or bladder calculi. Stomach/Bowel: Stomach is unremarkable for degree of distension. No pathologic dilation of small or large bowel. Wall thickening of the ascending colon through the hepatic flexure favored portal colopathy. Vascular/Lymphatic: Aortic atherosclerosis. Smooth IVC contours. No pathologically enlarged abdominal or pelvic lymph nodes. Reproductive: Uterus and bilateral adnexa are unremarkable. Other: Small volume of abdominopelvic ascites. Diffuse body wall edema. Musculoskeletal: No acute osseous abnormality. IMPRESSION: 1. Heterogeneous appearance of the hepatic parenchyma with interposed geographic areas of hepatic parenchymal hypodensity and hyperdensity. Findings are favored to reflect hepatic steatosis with areas of focal fatty sparing. However, consider further evaluation with nonemergent hepatic protocol MRI with and without contrast for more definitive characterization. 2. Hyperdense material in a nondilated gallbladder likely reflects sludge and stones. Gallbladder  wall thickening with trace pericholecystic fluid is favored reactive. Consider further evaluation with right upper quadrant ultrasound. 3. Wall thickening of the ascending colon through the hepatic flexure favored to reflect portal colopathy. 4. Small volume of abdominopelvic ascites compatible with portal hypertension. 5.  Aortic Atherosclerosis (ICD10-I70.0). Electronically Signed   By: Dahlia Bailiff M.D.   On: 10/30/2022 14:52    Assessment  41 y.o. female with a history of anxiety, depression, and cirrhosis secondary to chronic alcohol and possible PBC, presenting to the hospital with abdominal pain, jaundice, N/V, and admitted with SIRS, acute renal failure, alcoholic hepatitis, and acute anemia with Hgb 6.9.   SIRS: On empiric antibiotics. Blood cultures no growth in 48 hours, urine culture no growth, CXR with minimal right atelectasis, CT with with gallbladder wall thickening likely reactive, wall thickening of ascending colon through hepatic flexure most likely related to portal colopathy (no diarrhea). Small volume ascites. [place] limited with ascites.   Concern for cirrhosis: MELD 3.0 is 37. [place] with fatty infiltration but no nodularity.Marland Kitchen Spleen normal on CT. Mild thrombocytopenia. Known hepatic steatosis. Concern for PBC and started on Urso. Hep B surface antigen and Hep C antibody negative in 2021 (scanned labs). AMA markedly positive in the past. Previously recommend liver biopsy, but patient lost to follow-up. Tense abdomen today. Will arrange [place] para with fluid analysis. May simply be anasarca but needs further evaluation.  Further evaluation of liver disease as outpatient.   Alcoholic hepatitis: DF 40.9 with PT control of 13. Will await para to make sure not dealing with SBP, then decide on prednisolone.  Acute anemia: Hgb 6.9 on admission, receiving 3 units PRBCs. Hgb 9.8 yesterday. Check CBC  daily. Will need EGD prior to discharge. Empirically on octreotide drip. Concern for coffee-ground  emesis prior to admission. Currently no overt GI bleeding.   Mental status changes: patient is unable to tell me the year or city. Blankly staring but does converse. Checking ammonia level. Refused lactulose this morning. May need to increase. Need to document stool output clearly with goal of 2-3 per day.  ARF: creatinine 8.87 on admission, now 3.78. Per notes in epic, discussions have been had with Nephrology but no formal consultation.      Plan / Recommendations  Check ammonia [place] para with fluid analysis if sufficient. Albumin IV at time of para in light of acute renal failure DIscussing with nursing staff # of BMs: unclear per I/Os; may need to titrate lactulose Await para fluid analysis if sufficient fluid. May need to start prednisolone once ruling out any active infection Follow H/H EGD in near future prior to discharge. Continue octreotide empirically PPI BID Continue Urso 300 mg TID     LOS: 3 days    11/02/2022, 1:20 PM  Annitta Needs, PhD, [facility] Gastroenterology

## 2022-11-02 NOTE — Evaluation (Signed)
Physical Therapy Evaluation Patient Details Name: Mallory Mendoza MRN: [id number] DOB: 08/17/1981 Today's Date: 11/02/2022  History of Present Illness  Mallory Mendoza is a 41 y.o. female with medical history significant of COPD, hypertension, GERD, chronic pain.  She presents with shortness of breath over the past 2 days that have been worsening.  Shortness of breath worse with activity improved with rest.  Her boyfriend has been sick, but not as bad as she has been.  Nonproductive cough.  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  Denies fevers, chills.  She was initially hypoxic on arrival. She received steroids, antibiotics, nebs with improvement.  Clinical Impression  Pt stares blankly ahead and will not make eye contact with pt.  Pt inconsistent with one step command.  PT appears that she will be able to ambulate, however, pt appears trance like and will not walk with therapist at this time.   PT will need continued PT to ensure that she will be safe going home      Recommendations for follow up therapy are one component of a multi-disciplinary discharge planning process, led by the attending physician.  Recommendations may be updated based on patient status, additional functional criteria and insurance authorization.  Follow Up Recommendations Home health PT      Assistance Recommended at Discharge Frequent or constant Supervision/Assistance  Patient can return home with the following  A lot of help with walking and/or transfers;A lot of help with bathing/dressing/bathroom;Assistance with cooking/housework;Direct supervision/assist for medications management    Equipment Recommendations None recommended by PT  Recommendations for Other Services       Functional Status Assessment Patient has had a recent decline in their functional status and demonstrates the ability to make significant improvements in function in a reasonable and predictable amount of time.     Precautions /  Restrictions Precautions Precautions: Fall Restrictions Weight Bearing Restrictions: No      Mobility  Bed Mobility Overal bed mobility: Independent                  Transfers                   General transfer comment: therapist got pt to come sit to stand.  Pt does this I but will not walk pt just stands when therapist attempts to get pt to walk    Ambulation/Gait                  Stairs            Wheelchair Mobility    Modified Rankin (Stroke Patients Only)       Balance                                             Pertinent Vitals/Pain Pain Assessment Pain Assessment: No/denies pain    Home Living Family/patient expects to be discharged to:: Private residence Living Arrangements: Other relatives Available Help at Discharge: Family Type of Home: House Home Access: Stairs to enter   Technical brewer of Steps: 3   Home Layout: One level Home Equipment: None      Prior Function Prior Level of Function : Independent/Modified Independent                     Hand Dominance  Extremity/Trunk Assessment        Lower Extremity Assessment Lower Extremity Assessment: Difficult to assess due to impaired cognition       Communication   Communication:  (Pt will not respond to therapist except for ,"yes")  Cognition Arousal/Alertness: Lethargic   Overall Cognitive Status: Impaired/Different from baseline Area of Impairment: Attention, Following commands                 Orientation Level: Place, Time Current Attention Level: Divided           General Comments: can not state the month, year of president               Assessment/Plan    PT Assessment Patient needs continued PT services  PT Problem List Decreased activity tolerance       PT Treatment Interventions Gait training    PT Goals (Current goals can be found in the Care Plan section)       Frequency  Min 2X/week        AM-PAC PT "6 Clicks" Mobility  Outcome Measure Help needed turning from your back to your side while in a flat bed without using bedrails?: None Help needed moving from lying on your back to sitting on the side of a flat bed without using bedrails?: None Help needed moving to and from a bed to a chair (including a wheelchair)?: A Little Help needed standing up from a chair using your arms (e.g., wheelchair or bedside chair)?: None   Help needed climbing 3-5 steps with a railing? : A Little 6 Click Score: 18    End of Session Equipment Utilized During Treatment: Gait belt Activity Tolerance: Patient limited by lethargy Patient left: in bed Nurse Communication: Mobility status PT Visit Diagnosis: Unsteadiness on feet (R26.81)    Time: 1140-1200 PT Time Calculation (min) (ACUTE ONLY): 20 min   Charges:   PT Evaluation $PT Eval Low Complexity: 1 Low         Rayetta Humphrey, PT CLT [phone]   11/02/2022, 12:43 PM

## 2022-11-02 NOTE — Progress Notes (Signed)
Trace ascites on ultrasound. Insufficient for para.   Per nursing staff, patient vomits with oral intake. 2 large stools today thus far. Will plan on EGD on 12/19 with Dr. Jenetta Downer. I am ordering around the clock Zofran for patient.

## 2022-11-03 ENCOUNTER — Encounter (HOSPITAL_COMMUNITY)
Admission: EM | Disposition: A | Discharge status: Home Health | Payer: Self-pay | Source: Home / Self Care | Attending: Internal Medicine

## 2022-11-03 ENCOUNTER — Other Ambulatory Visit (HOSPITAL_COMMUNITY): Payer: Self-pay

## 2022-11-03 ENCOUNTER — Telehealth (HOSPITAL_COMMUNITY): Payer: Self-pay | Admitting: Pharmacy Technician

## 2022-11-03 ENCOUNTER — Encounter (HOSPITAL_COMMUNITY): Payer: Self-pay | Admitting: Family Medicine

## 2022-11-03 DIAGNOSIS — K920 Hematemesis: Secondary | ICD-10-CM | POA: Diagnosis not present

## 2022-11-03 DIAGNOSIS — K7200 Acute and subacute hepatic failure without coma: Secondary | ICD-10-CM

## 2022-11-03 DIAGNOSIS — K7011 Alcoholic hepatitis with ascites: Secondary | ICD-10-CM | POA: Diagnosis not present

## 2022-11-03 DIAGNOSIS — K704 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: Secondary | ICD-10-CM

## 2022-11-03 DIAGNOSIS — K7031 Alcoholic cirrhosis of liver with ascites: Secondary | ICD-10-CM | POA: Diagnosis not present

## 2022-11-03 LAB — COMPREHENSIVE METABOLIC PANEL
ALT: 17 U/L (ref 0–44)
AST: 65 U/L — ABNORMAL HIGH (ref 15–41)
Albumin: 3 g/dL — ABNORMAL LOW (ref 3.5–5.0)
Alkaline Phosphatase: 132 U/L — ABNORMAL HIGH (ref 38–126)
Anion gap: 13 (ref 5–15)
BUN: 49 mg/dL — ABNORMAL HIGH (ref 6–20)
CO2: 18 mmol/L — ABNORMAL LOW (ref 22–32)
Calcium: 9 mg/dL (ref 8.9–10.3)
Chloride: 110 mmol/L (ref 98–111)
Creatinine, Ser: 2.2 mg/dL — ABNORMAL HIGH (ref 0.44–1.00)
GFR, Estimated: 28 mL/min — ABNORMAL LOW (ref >60)
Glucose, Bld: 103 mg/dL — ABNORMAL HIGH (ref 70–99)
Potassium: 2.8 mmol/L — ABNORMAL LOW (ref 3.5–5.1)
Sodium: 141 mmol/L (ref 135–145)
Total Bilirubin: 14.9 mg/dL — ABNORMAL HIGH (ref 0.3–1.2)
Total Protein: 6.9 g/dL (ref 6.5–8.1)

## 2022-11-03 LAB — CBC WITH DIFFERENTIAL/PLATELET
Abs Immature Granulocytes: 0.8 10*3/uL — ABNORMAL HIGH (ref 0.00–0.07)
Basophils Absolute: 0 10*3/uL (ref 0.0–0.1)
Basophils Relative: 0 %
Eosinophils Absolute: 0.2 10*3/uL (ref 0.0–0.5)
Eosinophils Relative: 1 %
HCT: 29.9 % — ABNORMAL LOW (ref 36.0–46.0)
Hemoglobin: 10.1 g/dL — ABNORMAL LOW (ref 12.0–15.0)
Immature Granulocytes: 5 %
Lymphocytes Relative: 10 %
Lymphs Abs: 1.6 10*3/uL (ref 0.7–4.0)
MCH: 32.8 pg (ref 26.0–34.0)
MCHC: 33.8 g/dL (ref 30.0–36.0)
MCV: 97.1 fL (ref 80.0–100.0)
Monocytes Absolute: 0.9 10*3/uL (ref 0.1–1.0)
Monocytes Relative: 6 %
Neutro Abs: 12.3 10*3/uL — ABNORMAL HIGH (ref 1.7–7.7)
Neutrophils Relative %: 78 %
Platelets: 153 10*3/uL (ref 150–400)
RBC: 3.08 MIL/uL — ABNORMAL LOW (ref 3.87–5.11)
RDW: 22.5 % — ABNORMAL HIGH (ref 11.5–15.5)
WBC: 15.7 10*3/uL — ABNORMAL HIGH (ref 4.0–10.5)
nRBC: 0 % (ref 0.0–0.2)

## 2022-11-03 LAB — PROTIME-INR
INR: 1.7 — ABNORMAL HIGH (ref 0.8–1.2)
Prothrombin Time: 19.3 seconds — ABNORMAL HIGH (ref 11.4–15.2)

## 2022-11-03 LAB — AMMONIA: Ammonia: 135 umol/L — ABNORMAL HIGH (ref 9–35)

## 2022-11-03 SURGERY — ESOPHAGOGASTRODUODENOSCOPY (EGD) WITH PROPOFOL
Anesthesia: Monitor Anesthesia Care

## 2022-11-03 MED ORDER — POTASSIUM CHLORIDE 20 MEQ PO PACK
40.0000 meq | PACK | Freq: Two times a day (BID) | ORAL | Status: AC
  Administered 2022-11-03 (×2): 40 meq via ORAL
  Filled 2022-11-03 (×2): qty 2

## 2022-11-03 MED ORDER — RIFAXIMIN 200 MG PO TABS
200.0000 mg | ORAL_TABLET | Freq: Two times a day (BID) | ORAL | Status: DC
  Administered 2022-11-03: 200 mg via ORAL
  Filled 2022-11-03 (×3): qty 1

## 2022-11-03 MED ORDER — LACTULOSE 10 GM/15ML PO SOLN
10.0000 g | Freq: Two times a day (BID) | ORAL | Status: DC
  Administered 2022-11-03: 10 g via ORAL
  Filled 2022-11-03: qty 30

## 2022-11-03 MED ORDER — RIFAXIMIN 550 MG PO TABS
550.0000 mg | ORAL_TABLET | Freq: Two times a day (BID) | ORAL | Status: DC
  Administered 2022-11-03 – 2022-11-08 (×10): 550 mg via ORAL
  Filled 2022-11-03 (×11): qty 1

## 2022-11-03 MED ORDER — METOPROLOL TARTRATE 25 MG PO TABS
25.0000 mg | ORAL_TABLET | Freq: Two times a day (BID) | ORAL | Status: DC
  Administered 2022-11-03 – 2022-11-07 (×9): 25 mg via ORAL
  Filled 2022-11-03 (×10): qty 1

## 2022-11-03 MED ORDER — PREDNISOLONE 5 MG PO TABS
40.0000 mg | ORAL_TABLET | Freq: Every day | ORAL | Status: DC
  Administered 2022-11-04 – 2022-11-08 (×5): 40 mg via ORAL
  Filled 2022-11-03 (×7): qty 8

## 2022-11-03 MED ORDER — POTASSIUM CHLORIDE CRYS ER 20 MEQ PO TBCR
40.0000 meq | EXTENDED_RELEASE_TABLET | Freq: Two times a day (BID) | ORAL | Status: DC
  Administered 2022-11-03: 40 meq via ORAL
  Filled 2022-11-03: qty 2

## 2022-11-03 MED ORDER — LACTULOSE 10 GM/15ML PO SOLN
20.0000 g | ORAL | Status: AC
  Administered 2022-11-03 (×4): 20 g via ORAL
  Filled 2022-11-03 (×3): qty 30

## 2022-11-03 NOTE — Telephone Encounter (Signed)
Patient Advocate Encounter  Prior Authorization for Xifaxan '550MG'$  tablets has been approved.    PA# [id number] Key: [id number] Effective dates: 11/03/2022 through 11/03/2023  Patients co-pay is $4.00.     Lyndel Safe, [place] Patient Advocate Specialist [place] Patient Advocate Team Direct Number: [phone]  Fax: [phone]

## 2022-11-03 NOTE — Progress Notes (Signed)
Gastroenterology Progress Note   Referring Provider: No ref. provider found Primary Care Physician:  Carrolyn Meiers, MD Primary Gastroenterologist:  Elon Alas. Abbey Chatters, DO  Patient ID: Mallory Mendoza; [id number]; 05/31/1981   Subjective:    Son at bedside. Patient alert. Oriented to person, place, month. She thinks it is 12. Does not answer other questions asked. Last stool recorded last shift.   Objective:   Vital signs in last 24 hours: Temp:  [97.7 F (36.5 C)-98 F (36.7 C)] 97.8 F (36.6 C) (12/19 0535) Pulse Rate:  [87-94] 90 (12/19 0535) Resp:  [16-17] 16 (12/19 0535) BP: (145-160)/(86-101) 160/95 (12/19 0535) SpO2:  [99 %-100 %] 100 % (12/19 0535) Last BM Date : 11/02/22 General:   Alert,  answers minimally. Confused. NAD.  Head:  Normocephalic and atraumatic. Eyes:  Sclera clear, + icterus.  Abdomen:  Soft, nontender and slight distention.  Normal bowel sounds, without guarding, and without rebound.   Extremities:  Without clubbing, deformity or edema. Neurologic:  Alert and  oriented x 2;  grossly normal neurologically. Skin:  Intact without significant lesions or rashes. Psych:  could not evaluate  Intake/Output from previous day: 12/18 0701 - 12/19 0700 In: 868.2 [I.V.:768.2; IV Piggyback:100] Out: -  Intake/Output this shift: No intake/output data recorded.  Lab Results: CBC Recent Labs    10/31/22 1004 11/01/22 0654 11/03/22 0526  WBC 16.6* 15.0* 15.7*  HGB 10.9* 9.8* 10.1*  HCT 31.4* 28.3* 29.9*  MCV 95.4 93.4 97.1  PLT 126* 134* 153   BMET Recent Labs    11/01/22 0654 11/02/22 0604 11/03/22 0528  NA 133* 138 141  K 2.9* 2.5* 2.8*  CL 98 104 110  CO2 19* 18* 18*  GLUCOSE 96 107* 103*  BUN 65* 59* 49*  CREATININE 5.84* 3.78* 2.20*  CALCIUM 6.5* 7.5* 9.0   LFTs Recent Labs    10/31/22 1346 11/01/22 0654 11/02/22 0604 11/03/22 0528  BILITOT 13.8* 13.3* 14.7* 14.9*  BILIDIR 8.3*  --   --   --   IBILI 5.5*  --   --    --   ALKPHOS 181* 171* 155* 132*  AST 83* 74* 76* 65*  ALT '16 15 17 17  '$ PROT 6.8 6.5 7.2 6.9  ALBUMIN 2.7* 2.7* 3.6 3.0*   No results for input(s): "LIPASE" in the last 72 hours. PT/INR Recent Labs    11/01/22 0654 11/02/22 0604 11/03/22 0528  LABPROT 18.7* 20.0* 19.3*  INR 1.6* 1.7* 1.7*         Imaging Studies: [place] ASCITES (ABDOMEN LIMITED)  Result Date: 11/02/2022 CLINICAL DATA:  Ascites EXAM: LIMITED ABDOMEN ULTRASOUND FOR ASCITES TECHNIQUE: Limited ultrasound survey for ascites was performed in all four abdominal quadrants. COMPARISON:  CT abdomen and pelvis as well as ultrasound abdomen exam 10/30/2022 FINDINGS: Trace ascites present. Volume of ascites is insufficient for paracentesis. IMPRESSION: Trace ascites, insufficient for paracentesis. Electronically Signed   By: Lavonia Dana M.D.   On: 11/02/2022 13:53   DG CHEST PORT 1 VIEW  Result Date: 10/30/2022 CLINICAL DATA:  Shortness of breath, abdominal pain, nausea, vomiting, history cirrhosis EXAM: PORTABLE CHEST 1 VIEW COMPARISON:  Portable exam 1605 hours compared to 11/13/2021 FINDINGS: Normal heart size, mediastinal contours, and pulmonary vascularity. Minimal RIGHT basilar atelectasis. Lungs otherwise clear. No pulmonary infiltrate, pleural effusion, or pneumothorax. Osseous structures unremarkable. IMPRESSION: Minimal RIGHT basilar atelectasis. Electronically Signed   By: Lavonia Dana M.D.   On: 10/30/2022 16:21   US Abdomen Limited RUQ (  LIVER/GB)  Result Date: 10/30/2022 CLINICAL DATA:  RIGHT upper quadrant abdominal pain, history cirrhosis EXAM: ULTRASOUND ABDOMEN LIMITED RIGHT UPPER QUADRANT COMPARISON:  CT abdomen and pelvis 10/30/2022 FINDINGS: Gallbladder: Contracted, with thickened wall, nonspecific finding in the setting of ascites. No shadowing calculi or sonographic Murphy sign. Common bile duct: Diameter: 3 mm, normal Liver: Heterogeneous increased echogenicity of liver consistent with fatty infiltration seen  on prior CT. No hepatic mass or nodularity. No intrahepatic biliary dilatation. Portal vein is patent on color Doppler imaging with normal direction of blood flow towards the liver. Other: Scattered ascites. IMPRESSION: Ascites. Thickened gallbladder wall, a nonspecific finding in the setting of ascites. Fatty infiltration of liver. Electronically Signed   By: Lavonia Dana M.D.   On: 10/30/2022 16:03   CT ABDOMEN PELVIS WO CONTRAST  Result Date: 10/30/2022 CLINICAL DATA:  Chronic abdominal pain with nausea and vomiting. History of cirrhosis. EXAM: CT ABDOMEN AND PELVIS WITHOUT CONTRAST TECHNIQUE: Multidetector CT imaging of the abdomen and pelvis was performed following the standard protocol without IV contrast. RADIATION DOSE REDUCTION: This exam was performed according to the departmental dose-optimization program which includes automated exposure control, adjustment of the mA and/or kV according to patient size and/or use of iterative reconstruction technique. COMPARISON:  CT November 13, 2021. FINDINGS: Lower chest: Motion degraded examination of the lungs reveals no acute abnormality. Hepatobiliary: Heterogeneous appearance of the hepatic parenchyma with interposed geographic areas of hepatic parenchymal hypodensity and hyperdensity. Hyperdense material in a nondilated gallbladder likely reflects sludge and stones. Gallbladder wall thickening with trace pericholecystic fluid is favored reactive. No biliary ductal dilation. Pancreas: No pancreatic ductal dilation or evidence of acute inflammation. Spleen: No splenomegaly. Adrenals/Urinary Tract: Bilateral adrenal glands are within normal limits. No hydronephrosis. No renal, ureteral or bladder calculi. Stomach/Bowel: Stomach is unremarkable for degree of distension. No pathologic dilation of small or large bowel. Wall thickening of the ascending colon through the hepatic flexure favored portal colopathy. Vascular/Lymphatic: Aortic atherosclerosis. Smooth IVC  contours. No pathologically enlarged abdominal or pelvic lymph nodes. Reproductive: Uterus and bilateral adnexa are unremarkable. Other: Small volume of abdominopelvic ascites. Diffuse body wall edema. Musculoskeletal: No acute osseous abnormality. IMPRESSION: 1. Heterogeneous appearance of the hepatic parenchyma with interposed geographic areas of hepatic parenchymal hypodensity and hyperdensity. Findings are favored to reflect hepatic steatosis with areas of focal fatty sparing. However, consider further evaluation with nonemergent hepatic protocol MRI with and without contrast for more definitive characterization. 2. Hyperdense material in a nondilated gallbladder likely reflects sludge and stones. Gallbladder wall thickening with trace pericholecystic fluid is favored reactive. Consider further evaluation with right upper quadrant ultrasound. 3. Wall thickening of the ascending colon through the hepatic flexure favored to reflect portal colopathy. 4. Small volume of abdominopelvic ascites compatible with portal hypertension. 5.  Aortic Atherosclerosis (ICD10-I70.0). Electronically Signed   By: Dahlia Bailiff M.D.   On: 10/30/2022 14:52  [2 weeks]  Assessment:   41 y.o. female with a history of anxiety, depression, and suspected cirrhosis secondary to chronic alcohol and possible PBC, presenting to the hospital with abdominal pain, jaundice, N/V, and admitted with SIRS, acute renal failure, alcoholic hepatitis, and acute anemia with Hgb 6.9.    SIRS: Urine culture negative.  Chest x-ray with minimal right atelectasis.  Blood cultures remain negative at 48 hours.  CT with gallbladder wall thickening likely reactive, wall thickening of the ascending colon through the hepatic flexure most likely related to portal colopathy (no diarrhea).  Limited ascites on ultrasound.  Concern for  cirrhosis: Ultrasound/CT with fatty liver, no nodularity, normal spleen.  Positive AMA, ursodiol initiated previously.   Hepatitis B surface antigen and hepatitis C antibody negative in 2021.  Previously recommended to have a liver biopsy but patient was lost to follow-up. MELD 30 today.   Alcoholic hepatitis: DF 78.2 today using PT control of 13.  Plans to initiate prednisolone if blood cultures still remain negative at 72 hours.  Expect report later today.  Acute anemia: Hemoglobin 6.9 on admission, receiving 3 units of packed red blood cells.  Hemoglobin 10.1 today.  No overt GI bleeding while inpatient.  Concern for coffee-ground emesis prior to admission.  Empirically on octreotide drip.  Will need EGD prior to discharge.  Canceled today due to worsening mental status changes.  Mental status changes: Declined over the past 48 hours.  Ammonia level elevated.  She has been refusing lactulose over the past 48 hours.  She received 20 g in the morning of 1217.  Refused yesterday's dose.  Nursing was able to get a dose in this morning.  Rifaximin 550 mg twice daily started today however have not received in the pharmacy yet.   Acute renal failure: Creatinine 8.87 on admission, down to 2.2 today.  Plan:   Discussed with patient's nurse and son at bedside the importance of taking lactulose to help get her ammonia level down/improve mental status.  Goal of 3 BMs per 24 hours.  Increase lactulose to 88m every 2 hours for 4 doses. Reassess for improvement in mental status. Once mental status is improved, can drop to TID dosing with goal of 3 BMs daily. Titrate as needed to meet goal.  Postpone EGD given declining mental status, hypokalemia, stable hemoglobin with no overt GI bleeding.  Reassess in the morning. Full liquid diet allowed when alert.  NPO. after midnight. Start prednisolone 40 mg daily once blood cultures negative at 72 hours. Check CBC AM. PT/INR, CMET daily. Continue PPI twice daily. Continue Urso 300 mg 3 times daily.    LOS: 4 days   LLaureen Ochs LBernarda CaffeyRNorthside Hospital [facility]ettGastroenterology  [facility]ates [phone]/19/20239:23 AM

## 2022-11-03 NOTE — TOC Benefit Eligibility Note (Signed)
Patient Teacher, English as a foreign language completed.    The patient is currently admitted and upon discharge could be taking Xifaxan 550 mg tablets.  Prior Authorization Required  The patient is insured through [place], [place] Patient Advocate Specialist [place] Patient Advocate Team Direct Number: [phone]  Fax: [phone]

## 2022-11-03 NOTE — Progress Notes (Addendum)
[facility] PROGRESS NOTE    Progress Note  Mallory Mendoza  ZOX:[id number] DOB: 27-Sep-1981 DOA: 10/30/2022 PCP: Carrolyn Meiers, MD     Brief Narrative:   Mallory Mendoza is an 41 y.o. female past medical history significant for alcohol abuse, alcoholic cirrhosis with ongoing alcohol use she drinks about a pint of liquor every day comes in for nausea vomiting abdominal pain that started 2 days prior to admission, CT scan of the abdomen and pelvis showed hypodense nondilated gallbladder, general surgery was consulted and relates she does not have acute cholecystitis  Assessment/Plan:   SIRS: Discontinue IV Rocephin. Leukocytosis is slowly improving. Likely due to alcoholic hepatitis. Abdominal ultrasound showed trace ascites.  Acute renal failure (ARF) (HCC) Likely prerenal azotemia due to poor oral intake. Creatinine continues to improve with IV fluid resuscitation. She has had good urine output. Continue strict I's and O's and daily weights.  New intractable nausea and vomiting: GI recommended n.p.o. for possible endoscopy on 10/24/2022.  Decompensated alcoholic cirrhosis/alcoholic hepatitis: With a MELD score 3.0 of 37. GI has been consulted, infection has been ruled out.  Workup for infectious etiology has been negative. UA does not show signs of infection, blood cultures remain negative to date. Discontinue IV Rocephin. Elevation in procalcitonin to be probably due to SIRS reaction but I cannot find a source of infection Her ammonia level is trending up continue lactulose twice a day. GI recommended to start prednisolone orally.  Acute hepatic encephalopathy: She is a little bit slower today her encephalopathy seems to be worsening, she also has mild asterixis on physical exam, continue oral lactulose at a higher dose and  Hypovolemic hyponatremia: Resolved with fluid resuscitation.  Acute normocytic anemia: Of unclear etiology she denies any overt  bleeding. She is currently getting 2 units of packed red blood cells. Hemoglobin posttransfusion was 9.8. Continue Protonix and octreotide.  Hypokalemia: Improved today with IV fluids, will add on oral hydration. She is having multiple bowel movements which is probably not helping with potassium absorption.  Hypoalbuminemia: GI recommended IV albumin  Alcohol abuse Last drink was 5 days prior to admission, continue thiamine and folate continue monitor with CIWA protocol.   DVT prophylaxis: scd Family Communication:none Status is: Inpatient Remains inpatient appropriate because: abd pain, Nausea and vomiting    Code Status:  Code Status History     Date Active Date Inactive Code Status Order ID Comments User Context   11/13/2021 1717 11/15/2021 2238 Full Code [id number]  Bethena Roys, MD Inpatient   06/11/2014 1444 06/12/2014 1851 Full Code [id number]  Lisette Abu, PA-C Inpatient         IV Access:   Peripheral IV   Procedures and diagnostic studies:   [place] ASCITES (ABDOMEN LIMITED)  Result Date: 11/02/2022 CLINICAL DATA:  Ascites EXAM: LIMITED ABDOMEN ULTRASOUND FOR ASCITES TECHNIQUE: Limited ultrasound survey for ascites was performed in all four abdominal quadrants. COMPARISON:  CT abdomen and pelvis as well as ultrasound abdomen exam 10/30/2022 FINDINGS: Trace ascites present. Volume of ascites is insufficient for paracentesis. IMPRESSION: Trace ascites, insufficient for paracentesis. Electronically Signed   By: Lavonia Dana M.D.   On: 11/02/2022 13:53     Medical Consultants:   None.   Subjective:    Mallory Mendoza vomited twice yesterday, she is sleepy.  Objective:    Vitals:   11/02/22 0413 11/02/22 1700 11/02/22 2053 11/03/22 0535  BP: 133/83 (!) 145/86 (!) 158/101 (!) 160/95  Pulse: 91 87 94 90  Resp: '18 16 17 16  '$ Temp: 98.4 F (36.9 C) 97.7 F (36.5 C) 98 F (36.7 C) 97.8 F (36.6 C)  TempSrc:  Axillary Oral Oral  SpO2: 100%  99% 100% 100%  Weight:      Height:       SpO2: 100 %   Intake/Output Summary (Last 24 hours) at 11/03/2022 0755 Last data filed at 11/03/2022 0533 Gross per 24 hour  Intake 868.21 ml  Output --  Net 868.21 ml    Filed Weights   10/30/22 1107  Weight: 56.7 kg    Exam: General exam: In no acute distress. Respiratory system: Good air movement and clear to auscultation. Cardiovascular system: S1 & S2 heard, RRR. No JVD. Gastrointestinal system: Abdomen is nondistended, soft and nontender.  Central nervous system: Alert and oriented x 1, positive for asterixis Extremities: No pedal edema. Skin: No rashes, lesions or ulcers Psychiatry: Mood and affect are slow this morning.   Data Reviewed:    Labs: Basic Metabolic Panel: Recent Labs  Lab 10/30/22 1200 10/30/22 1344 10/31/22 1004 11/01/22 0654 11/02/22 0604 11/02/22 0631 11/03/22 0528  NA 128*  --  128* 133* 138  --  141  K 4.6  --  3.3* 2.9* 2.5*  --  2.8*  CL 85*  --  91* 98 104  --  110  CO2 21*  --  16* 19* 18*  --  18*  GLUCOSE 103*  --  95 96 107*  --  103*  BUN 63*  --  66* 65* 59*  --  49*  CREATININE 8.87*  --  7.57* 5.84* 3.78*  --  2.20*  CALCIUM 7.1*  --  6.6* 6.5* 7.5*  --  9.0  MG  --  2.4  --   --   --  1.8  --     GFR Estimated Creatinine Clearance: 29.1 mL/min (A) (by C-G formula based on SCr of 2.2 mg/dL (H)). Liver Function Tests: Recent Labs  Lab 10/30/22 1200 10/31/22 1346 11/01/22 0654 11/02/22 0604 11/03/22 0528  AST 117* 83* 74* 76* 65*  ALT '22 16 15 17 17  '$ ALKPHOS 199* 181* 171* 155* 132*  BILITOT 11.9* 13.8* 13.3* 14.7* 14.9*  PROT 6.3* 6.8 6.5 7.2 6.9  ALBUMIN 1.8* 2.7* 2.7* 3.6 3.0*    Recent Labs  Lab 10/30/22 1200  LIPASE 35    Recent Labs  Lab 10/30/22 2030 11/01/22 0924 11/02/22 1350 11/03/22 0528  AMMONIA 78* 100* 134* 135*    Coagulation profile Recent Labs  Lab 10/30/22 1344 10/31/22 1346 11/01/22 0654 11/02/22 0604 11/03/22 0528  INR 1.6*  1.4* 1.6* 1.7* 1.7*    COVID-19 Labs  No results for input(s): "DDIMER", "FERRITIN", "LDH", "CRP" in the last 72 hours.  Lab Results  Component Value Date   SARSCOV2NAA NEGATIVE 11/13/2021   [place] NEGATIVE 03/18/2021    CBC: Recent Labs  Lab 10/30/22 1200 10/31/22 1004 11/01/22 0654  WBC 16.6* 16.6* 15.0*  NEUTROABS 12.6* 13.4* 12.7*  HGB 6.9* 10.9* 9.8*  HCT 20.7* 31.4* 28.3*  MCV 104.0* 95.4 93.4  PLT 210 126* 134*    Cardiac Enzymes: No results for input(s): "CKTOTAL", "CKMB", "CKMBINDEX", "TROPONINI" in the last 168 hours. BNP (last 3 results) No results for input(s): "PROBNP" in the last 8760 hours. CBG: No results for input(s): "GLUCAP" in the last 168 hours. D-Dimer: No results for input(s): "DDIMER" in the last 72 hours. Hgb A1c: No results for input(s): "HGBA1C" in the last 72 hours.  Lipid Profile: No results for input(s): "CHOL", "HDL", "LDLCALC", "TRIG", "CHOLHDL", "LDLDIRECT" in the last 72 hours. Thyroid function studies: No results for input(s): "TSH", "T4TOTAL", "T3FREE", "THYROIDAB" in the last 72 hours.  Invalid input(s): "FREET3" Anemia work up: No results for input(s): "VITAMINB12", "FOLATE", "FERRITIN", "TIBC", "IRON", "RETICCTPCT" in the last 72 hours. Sepsis Labs: Recent Labs  Lab 10/30/22 1200 10/31/22 1004 11/01/22 0654  PROCALCITON  --  15.33  --   WBC 16.6* 16.6* 15.0*    Microbiology Recent Results (from the past 240 hour(s))  Urine Culture     Status: None   Collection Time: 10/30/22  7:38 PM   Specimen: Urine, Clean Catch  Result Value Ref Range Status   Specimen Description   Final    URINE, CLEAN CATCH Performed at [facility], [address] [place], [place] [address]    Special Requests   Final    NONE Performed at [facility], [address] [place], [place] [address]    Culture   Final    NO GROWTH Performed at [facility], [place] [address] [place], [place] [address]    Report Status 11/01/2022  FINAL  Final  Culture, blood (Routine X 2) w Reflex to ID Panel     Status: None (Preliminary result)   Collection Time: 10/31/22 10:04 AM   Specimen: BLOOD RIGHT ARM  Result Value Ref Range Status   Specimen Description   Final    BLOOD RIGHT ARM BOTTLES DRAWN AEROBIC AND ANAEROBIC   Special Requests Blood Culture adequate volume  Final   Culture   Final    NO GROWTH 2 DAYS Performed at [facility], [address] [place], [place] [address]    Report Status PENDING  Incomplete  Culture, blood (Routine X 2) w Reflex to ID Panel     Status: None (Preliminary result)   Collection Time: 10/31/22 10:04 AM   Specimen: BLOOD RIGHT HAND  Result Value Ref Range Status   Specimen Description   Final    BLOOD RIGHT HAND BOTTLES DRAWN AEROBIC AND ANAEROBIC   Special Requests Blood Culture adequate volume  Final   Culture   Final    NO GROWTH 2 DAYS Performed at [facility], [address] [place],  [address]    Report Status PENDING  Incomplete     Medications:    lactulose  10 g Oral Daily   nicotine  14 mg Transdermal Daily   ondansetron (ZOFRAN) IV  4 mg Intravenous Q6H   pantoprazole (PROTONIX) IV  40 mg Intravenous Q12H   ursodiol  300 mg Oral TID   Continuous Infusions:  0.9 % NaCl with KCl 40 mEq / L 75 mL/hr at 11/03/22 0533   cefTRIAXone (ROCEPHIN)  IV 1 g (11/02/22 0850)   octreotide (SANDOSTATIN) 500 mcg in sodium chloride 0.9 % 250 mL (2 mcg/mL) infusion 50 mcg/hr (11/03/22 0330)      LOS: 4 days   Charlynne Cousins  [facility]  11/03/2022, 7:55 AM

## 2022-11-03 NOTE — Progress Notes (Signed)
Pt had a restless night. She ambulated to restroom a couple times and ambulated from her RM ([id number]) to the nurses station x 2 with walker. She remains confused and only oriented to self and once stated  to this RN that she was at [facility]. She is disoriented to time and situation.  Bed alarm utilized due to multiple attempts to climbing out of bed. Son stayed overnight with patient and was very supportive. Upon this RN's initial shift assessment, her R AC IV site appeared red and arm edematous (+1). IV was removed and another placed (22g Left AC). Arm was elevated and ice applied. Lab also had difficult time obtaining labs this AM. Pt is a very difficult stick and may benefit from a PICC line. She did c/o left sided abdominal pain several times and PRN dilaudid was administered. Octreotide was resumed. At this time 22g Left AC IV remains patent. No episodes of vomiting. Bryson Corona Edd Fabian

## 2022-11-04 DIAGNOSIS — N179 Acute kidney failure, unspecified: Secondary | ICD-10-CM | POA: Diagnosis not present

## 2022-11-04 DIAGNOSIS — Z7189 Other specified counseling: Secondary | ICD-10-CM

## 2022-11-04 DIAGNOSIS — K704 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: Principal | ICD-10-CM

## 2022-11-04 DIAGNOSIS — K7031 Alcoholic cirrhosis of liver with ascites: Secondary | ICD-10-CM | POA: Diagnosis not present

## 2022-11-04 DIAGNOSIS — D649 Anemia, unspecified: Secondary | ICD-10-CM | POA: Diagnosis not present

## 2022-11-04 DIAGNOSIS — Z515 Encounter for palliative care: Secondary | ICD-10-CM | POA: Diagnosis not present

## 2022-11-04 DIAGNOSIS — E7220 Disorder of urea cycle metabolism, unspecified: Secondary | ICD-10-CM

## 2022-11-04 DIAGNOSIS — K7200 Acute and subacute hepatic failure without coma: Secondary | ICD-10-CM | POA: Diagnosis not present

## 2022-11-04 DIAGNOSIS — F1010 Alcohol abuse, uncomplicated: Secondary | ICD-10-CM

## 2022-11-04 LAB — CBC WITH DIFFERENTIAL/PLATELET
Abs Immature Granulocytes: 0.6 10*3/uL — ABNORMAL HIGH (ref 0.00–0.07)
Abs Immature Granulocytes: 0.81 10*3/uL — ABNORMAL HIGH (ref 0.00–0.07)
Basophils Absolute: 0.1 10*3/uL (ref 0.0–0.1)
Basophils Absolute: 0.1 10*3/uL (ref 0.0–0.1)
Basophils Relative: 1 %
Basophils Relative: 1 %
Eosinophils Absolute: 0.2 10*3/uL (ref 0.0–0.5)
Eosinophils Absolute: 0.3 10*3/uL (ref 0.0–0.5)
Eosinophils Relative: 1 %
Eosinophils Relative: 1 %
HCT: 31.4 % — ABNORMAL LOW (ref 36.0–46.0)
HCT: 28.3 % — ABNORMAL LOW (ref 36.0–46.0)
Hemoglobin: 10.9 g/dL — ABNORMAL LOW (ref 12.0–15.0)
Hemoglobin: 9.6 g/dL — ABNORMAL LOW (ref 12.0–15.0)
Immature Granulocytes: 4 %
Immature Granulocytes: 5 %
Lymphocytes Relative: 8 %
Lymphocytes Relative: 10 %
Lymphs Abs: 1.4 10*3/uL (ref 0.7–4.0)
Lymphs Abs: 1.9 10*3/uL (ref 0.7–4.0)
MCH: 33.1 pg (ref 26.0–34.0)
MCH: 32.8 pg (ref 26.0–34.0)
MCHC: 34.7 g/dL (ref 30.0–36.0)
MCHC: 33.9 g/dL (ref 30.0–36.0)
MCV: 95.4 fL (ref 80.0–100.0)
MCV: 96.6 fL (ref 80.0–100.0)
Monocytes Absolute: 1 10*3/uL (ref 0.1–1.0)
Monocytes Absolute: 1.1 10*3/uL — ABNORMAL HIGH (ref 0.1–1.0)
Monocytes Relative: 6 %
Monocytes Relative: 6 %
Neutro Abs: 13.4 10*3/uL — ABNORMAL HIGH (ref 1.7–7.7)
Neutro Abs: 13.9 10*3/uL — ABNORMAL HIGH (ref 1.7–7.7)
Neutrophils Relative %: 80 %
Neutrophils Relative %: 77 %
Platelets: 126 10*3/uL — ABNORMAL LOW (ref 150–400)
Platelets: 152 10*3/uL (ref 150–400)
RBC: 3.29 MIL/uL — ABNORMAL LOW (ref 3.87–5.11)
RBC: 2.93 MIL/uL — ABNORMAL LOW (ref 3.87–5.11)
RDW: 21.7 % — ABNORMAL HIGH (ref 11.5–15.5)
RDW: 22 % — ABNORMAL HIGH (ref 11.5–15.5)
WBC: 16.6 10*3/uL — ABNORMAL HIGH (ref 4.0–10.5)
WBC: 18 10*3/uL — ABNORMAL HIGH (ref 4.0–10.5)
nRBC: 0.1 % (ref 0.0–0.2)
nRBC: 0 % (ref 0.0–0.2)

## 2022-11-04 LAB — COMPREHENSIVE METABOLIC PANEL
ALT: 20 U/L (ref 0–44)
AST: 70 U/L — ABNORMAL HIGH (ref 15–41)
Albumin: 2.7 g/dL — ABNORMAL LOW (ref 3.5–5.0)
Alkaline Phosphatase: 118 U/L (ref 38–126)
Anion gap: 10 (ref 5–15)
BUN: 34 mg/dL — ABNORMAL HIGH (ref 6–20)
CO2: 18 mmol/L — ABNORMAL LOW (ref 22–32)
Calcium: 9.7 mg/dL (ref 8.9–10.3)
Chloride: 113 mmol/L — ABNORMAL HIGH (ref 98–111)
Creatinine, Ser: 1.22 mg/dL — ABNORMAL HIGH (ref 0.44–1.00)
GFR, Estimated: 57 mL/min — ABNORMAL LOW (ref >60)
Glucose, Bld: 114 mg/dL — ABNORMAL HIGH (ref 70–99)
Potassium: 3.1 mmol/L — ABNORMAL LOW (ref 3.5–5.1)
Sodium: 141 mmol/L (ref 135–145)
Total Bilirubin: 14.5 mg/dL — ABNORMAL HIGH (ref 0.3–1.2)
Total Protein: 6.5 g/dL (ref 6.5–8.1)

## 2022-11-04 LAB — PROTIME-INR
INR: 1.9 — ABNORMAL HIGH (ref 0.8–1.2)
Prothrombin Time: 21.4 seconds — ABNORMAL HIGH (ref 11.4–15.2)

## 2022-11-04 MED ORDER — HYDRALAZINE HCL 20 MG/ML IJ SOLN
5.0000 mg | INTRAMUSCULAR | Status: DC | PRN
  Administered 2022-11-04: 5 mg via INTRAVENOUS
  Filled 2022-11-04: qty 1

## 2022-11-04 MED ORDER — LABETALOL HCL 5 MG/ML IV SOLN: 10.0000 mg | INTRAVENOUS | Status: DC | PRN

## 2022-11-04 MED ORDER — LACTULOSE 10 GM/15ML PO SOLN
20.0000 g | ORAL | Status: AC
  Administered 2022-11-04 (×4): 20 g via ORAL
  Filled 2022-11-04 (×4): qty 30

## 2022-11-04 MED ORDER — HYDRALAZINE HCL 20 MG/ML IJ SOLN: 10.0000 mg | INTRAMUSCULAR | Status: DC | PRN

## 2022-11-04 NOTE — Consult Note (Signed)
Palliative Care Consult Note                                  Date: 11/04/2022   Patient Name: Mallory Mendoza  DOB: 1981-06-28  MRN: [id number]  Age / Sex: 41 y.o., female  PCP: Carrolyn Meiers, MD Referring Physician: Dwyane Dee, MD  Reason for Consultation: Establishing goals of care  HPI/Patient Profile: 41 y.o. female  with past medical history of alcohol use disorder with dependence, depression/anxiety, alcoholic cirrhosis.  She presented with progressively worsening abdominal pain. Workup was notable for severe alcoholic hepatitis and decompensated liver failure.  PMT was consulted for [place] conversations.  Past Medical History:  Diagnosis Date   Abscess    Anxiety    Biliary cirrhosis ([place])    Depression     Subjective:   This NP Walden Field reviewed medical records, received report from team, assessed the patient and then meet at the patient's bedside to discuss diagnosis, prognosis, GOC, EOL wishes disposition and options.  I met with the patient at the bedside who is somnolent and not interactive. Multiple family members were at the bedside.   Concept of Palliative Care was introduced as specialized medical care for people and their families living with serious illness.  If focuses on providing relief from the symptoms and stress of a serious illness.  The goal is to improve quality of life for both the patient and the family. Values and goals of care important to patient and family were attempted to be elicited.  Created space and opportunity for patient  and family to explore thoughts and feelings regarding current medical situation   Natural trajectory and current clinical status were discussed. Questions and concerns addressed. Patient  encouraged to call with questions or concerns.    Patient/Family Understanding of Illness: They understand she has cirrhosis and is having problems with her kidney and liver  function.  The plan has been explained to them as "wait-and-see".  They are satisfied with the explanation of her current clinical situation.  They understand that she is at high risk for decompensation.  We had further discussions about her acute and chronic clinical issues.  Life Review: The patient is not married, has 4 children aged 26, 39, and 27 (there is a set of twins).  She has worked in Restaurant manager, fast food, Therapist, art, warehouse, [employer], and others.  Things that bring her happiness and joy including music, cooking, watching cooking shows, Benji her bulldog.  She is known for cooking cabbage.  Patient Values: Family  Goals: To try to get better and be able to leave the hospital.  Today's Discussion: In addition to discussions above we had extensive discussion on various topics.  I answered multiple questions about the pathophysiology of alcoholic cirrhosis, alcoholic hepatitis, the use and function of various medications including Xifaxan, lactulose, steroids and acute alcoholic hepatitis.  They understand she is a high risk for decompensation.  We discussed her MELD score that while it has improved slightly and her kidney function has improved, she is still very confused and this is concerning.  They understand overall she has a poor prognosis.  We discussed CODE STATUS.  They would like her to remain a full code as she vocalizes in the emergency room that she would like "everything done because I still have stuff to do."  We discussed our overall plan.  They would like  to continue full scope of care including all medications and medical treatment to try to improve her current situation.  They understand that she is at high risk for decompensation and death.  At this point they have requested to take it a day at a time.  They understand on day 7 there will be a calculation done (score) to determine if steroids are working.  I provided emotional and general support  through therapeutic listening, empathy, sharing of stories, and other techniques. I answered all questions and addressed all concerns to the best of my ability.  Review of Systems  Unable to perform ROS: Mental status change    Objective:   Primary Diagnoses: Present on Admission:  Alcohol abuse   Physical Exam Vitals and nursing note reviewed.  Constitutional:      General: She is not in acute distress.    Appearance: She is ill-appearing.     Comments: Somnolent, not interactive  HENT:     Head: Normocephalic and atraumatic.  Pulmonary:     Effort: Pulmonary effort is normal. No respiratory distress.  Abdominal:     General: Abdomen is flat. There is no distension.     Palpations: Abdomen is soft.  Skin:    General: Skin is warm and dry.  Neurological:     Mental Status: She is lethargic.     Comments: Non-interactive     Vital Signs:  BP (!) 151/113 (BP Location: Right Wrist)   Pulse 86   Temp 98 F (36.7 C)   Resp 17   Ht _0  (1.626 m)   Wt 56.7 kg   SpO2 98%   BMI 21.46 kg/m   Palliative Assessment/Data: 40%    Advanced Care Planning:   Existing Vynca/ACP Documentation: None  Primary Decision Maker: NEXT OF KIN  Code Status/Advance Care Planning: Full code  A discussion was had today regarding advanced directives. Concepts specific to code status, artifical feeding and hydration, continued IV antibiotics and rehospitalization was had.  The difference between a aggressive medical intervention path and a palliative comfort care path for this patient at this time was had.   Decisions/Changes to ACP: None today  Assessment & Plan:   Impression: 41 year old female with cirrhosis, alcohol use disorder, acute alcoholic hepatitis.  She remains on steroids at this time.  Some mild improvement in meld-NA score (36 on admission, 30 today) although her discriminant function yesterday was 43.9 and today it is 53.1.  Her INR is also worsened from 1.4-1  0.7-1.9.  Also concerning is that her mental status is not clarified much despite use of lactulose and Xifaxan.  She is very high risk for decompensation, high risk mortality.  Overall long-term prognosis is poor.  She is not a transplant candidate due to ongoing alcohol use.  SUMMARY OF RECOMMENDATIONS   Remain full code per family wishes Full scope of treatment Time for outcomes Continue steroids and calculation of los: Day 7 Continue lactulose and Xifaxan for attempts at improving mental status PMT will continue to follow  Symptom Management:  Per primary team PMT is available to assist as needed  Prognosis:  Unable to determine  Discharge Planning:  To Be Determined   Discussed with: Patient's family, medical team, nursing team    Thank you for allowing [place] to participate in the care of ZHARA GIESKE PMT will continue to support holistically.  Time Total: 90 min  Greater than 50%  of this time was spent counseling and coordinating care related  to the above assessment and plan.  Signed by: Walden Field, NP Palliative Medicine Team  Team Phone # [phone] (Nights/Weekends)  11/04/2022, 5:33 PM

## 2022-11-04 NOTE — Progress Notes (Signed)
Subjective: Family at bedside, patient remains very somnolent. Occasionally opens her eyes spontaneously but does not respond to any questions during my assessment. Family states she has not been very communicable this morning at all, saying a few words here and there. Patient is sitting up in bed with her head hung down, drooling.    Objective: Vital signs in last 24 hours: Temp:  [98 F (36.7 C)-98.4 F (36.9 C)] 98 F (36.7 C) (12/20 0634) Pulse Rate:  [88-103] 88 (12/20 0832) Resp:  [16-19] 19 (12/20 0634) BP: (146-184)/(93-114) 161/114 (12/20 0837) SpO2:  [100 %] 100 % (12/20 0634) Last BM Date : 11/02/22 General: somnolent, not responsive to verbal or tactile stimulation. Drooling  Head:  Normocephalic and atraumatic. Eyes:  No icterus, sclera clear. Conjuctiva pink.  Mouth:  Without lesions, mucosa pink and moist.  Heart:  S1, S2 present, no murmurs noted.  Lungs: Clear to auscultation bilaterally, without wheezing, rales, or rhonchi.  Abdomen:  Bowel sounds present, soft, non-tender, non-distended. No HSM or hernias noted. No rebound or guarding. No masses appreciated  Msk:  Symmetrical without gross deformities. Normal posture. Pulses:  Normal pulses noted. Extremities:  Without clubbing or edema. Neurologic:  not alert, somnolent, unable to answer questions  Skin:  Warm and dry, intact without significant lesions.  Psych:  somnolent.   Intake/Output from previous day: 12/19 0701 - 12/20 0700 In: 302.1 [I.V.:302.1] Out: -  Intake/Output this shift: No intake/output data recorded.  Lab Results: Recent Labs    11/03/22 0526 11/04/22 0432  WBC 15.7* 18.0*  HGB 10.1* 9.6*  HCT 29.9* 28.3*  PLT 153 152   BMET Recent Labs    11/02/22 0604 11/03/22 0528 11/04/22 0432  NA 138 141 141  K 2.5* 2.8* 3.1*  CL 104 110 113*  CO2 18* 18* 18*  GLUCOSE 107* 103* 114*  BUN 59* 49* 34*  CREATININE 3.78* 2.20* 1.22*  CALCIUM 7.5* 9.0 9.7   LFT Recent Labs     11/02/22 0604 11/03/22 0528 11/04/22 0432  PROT 7.2 6.9 6.5  ALBUMIN 3.6 3.0* 2.7*  AST 76* 65* 70*  ALT '17 17 20  '$ ALKPHOS 155* 132* 118  BILITOT 14.7* 14.9* 14.5*   PT/INR Recent Labs    11/03/22 0528 11/04/22 0432  LABPROT 19.3* 21.4*  INR 1.7* 1.9*   Studies/Results: [place] ASCITES (ABDOMEN LIMITED)  Result Date: 11/02/2022 CLINICAL DATA:  Ascites EXAM: LIMITED ABDOMEN ULTRASOUND FOR ASCITES TECHNIQUE: Limited ultrasound survey for ascites was performed in all four abdominal quadrants. COMPARISON:  CT abdomen and pelvis as well as ultrasound abdomen exam 10/30/2022 FINDINGS: Trace ascites present. Volume of ascites is insufficient for paracentesis. IMPRESSION: Trace ascites, insufficient for paracentesis. Electronically Signed   By: Lavonia Dana M.D.   On: 11/02/2022 13:53    Assessment: 41 y.o. female with a history of anxiety, depression, and suspected cirrhosis secondary to chronic alcohol and possible PBC, presenting to the hospital with abdominal pain, jaundice, N/V, and admitted with SIRS, acute renal failure, alcoholic hepatitis, and acute anemia with Hgb 6.9.   SIRS: urine culture negative, cxray with minimal atelectasis. Blood cultures negative. CT with gallbladder wall thickening likely reactive, wall thickening of the ascending colon through the hepatic flexure most likely related to portal colopathy (no diarrhea). Limited ascites on ultrasound.   Alcoholic hepatitis with acute liver failure: DF 43.9 yesterday, prednisolone initiated yesterday. DF today is 53.1. patient is not a liver transplant candidate due to continued recent ETOH use. Palliative care involvement was  recommended yesterday due to poor prognosis.   Concern for cirrhosis/PBC: US/CT with fatty liver, no nodularity, normal spleen.  Positive AMA, ursodiol initiated previously.  Hepatitis B surface antigen and hepatitis C antibody negative in 2021. Previously recommended to have a liver biopsy but patient was  lost to follow-up. MELD 30 today. Not a liver transplant candidate, as above.   Acute anemia: hgb 6.9 on admission, s/p 3 units PRBCs. Hgb 9.6 today. No overt GI bleeding while inpatient. Concern for coffee ground emesis prior to admission. Empirically on octreotide drip since 12/15, can likely stop this today as she has remained on this x5 days. Will need EGD for further evaluation at some point, though cancelled previously this admission due to worsening mental status.   AMS: likely secondary to acute liver failure. declined over the past few days. Ammonia 135. Pt previously refusing lactulose intermittently. Dosing of 65m q2h x4 yesterday, will reorder this dose today, can decrease to TID dosing once MS starts to improve.  Xifaxan '550mg'$  BID started yesterday. She remains very somnolent. Opens her eyes spontaneously on occasion but does not actively respond to verbal or tactile stimulation during my visit.   AKI: improving. creatnine 8.87 on admission, 1.22 today   Plan: -Will restart lactulose 321mevery 2 hours x 4 doses. Reassess for improvement in mental status. Once mental status is improved, can drop to TID dosing with goal of 3 BMs daily. Titrate as needed to meet goal  -Continue xifaxan '550mg'$  BID -EGD once mental status improves -Prednisolone '40mg'$  daily -Calculate DF daily, [place] score 7 days after steroid initiation -MELD score daily -PPI BID -Continue Urso '300mg'$  TID -CMP/PT/INR daily -agree with palliative care consult  -clear liquids if tolerating PO meds/lactulose -consider stopping octreotide drip today    LOS: 5 days    11/04/2022, 8:59 AM   Mark Benecke L. CaAlver SorrowMSN, APRN, AGNP-C Adult-Gerontology Nurse Practitioner [facility]

## 2022-11-04 NOTE — Progress Notes (Signed)
Progress Note    Mallory Mendoza   MHD:[id number]  DOB: November 03, 1981  DOA: 10/30/2022     5 PCP: Carrolyn Meiers, MD  Initial CC: abdominal pain  Hospital Course: Mallory Mendoza is a 41 yo female with PMH alcohol use disorder with dependence, depression/anxiety, alcoholic cirrhosis.  She presented with progressively worsening abdominal pain. Workup was notable for severe alcoholic hepatitis and decompensated liver failure.  GI was consulted on admission.  Infectious workup was commenced and after ruled out, she was started on steroids.  Interval History:  No events overnight.  Her daughter is present bedside along with other family members this morning.  Reviewed hospitalization thus far with daughter and ongoing plan of care.  Patient remains significantly encephalopathic and unable to partake in exam and remains nonverbal.  It appears that she has been able to swallow lactulose and some other food family has been feeding her. When seen this morning, she was essentially just sitting up in bed with her head facing down and unable to spontaneously move any of her extremities nor able to fully lift her head up.   Assessment and Plan:  Decompensated acute liver failure - MELD-Na 36 on admission  - after underlying infection ruled out, she was started on prednisolone on 11/04/2022; please inform GI or primary service if patient refuses tablet or unable to take in order to change to IV route -Not a transplant candidate due to active etoh abuse on admission  - despite improvement in MELD-Na since admission, prognosis remains poor and she remains encephalopathic; has been taking lacutlose and was started on xifaximin as well - trend CMP, NH3, and PT/INR daily - palliative care consulted for assistance with [place] discussions; we have communicated to daughter and family that her prognosis is poor  Alcoholic hepatitis - elevated DF on admission -Prednisolone started per GI; Patietn took first  dose on 11/04/2022  Acute hepatic encephalopathy - Patient became progressively lethargic and obtunded - NH3 was 78 on admission and uptrended to 135 so far; lactulose has been initiated; if unable to tolerated PO would pursue enema - BUN also noted 63 on admission and down to 34 now, but no improvement in mentation, thus suspect this is moreso due to liver failure; infection has also been ruled out  AKI - baseline creatinine ~ 1 - patient presents with increase in creat >0.3 mg/dL above baseline, creat increase >1.5x baseline presumed to have occurred within past 7 days PTA - creat 8.87 on admission - differential includes prerenal vs HRS vs combo - appears to have responded to IVF - continue trending renal function  -Mentation has not improved despite improvement in uremia as well  SIRS -Leukocytosis noted on admission.  UA not considered consistent with UTI nor any growth on urine culture.  Blood cultures also negative since admission - Rocephin had initially been started and was discontinued - Etiology considered due to underlying alcoholic hepatitis -Only trace ascites noted on abdominal ultrasound insufficient for paracentesis  Nausea/vomiting - probably from component of EtOH withdrawal - GI planning on possible eventual EGD if patient stabilizes/improves   Hypovolemic hyponatremia Resolved with fluid resuscitation.   Acute normocytic anemia -Has been on Protonix and octreotide - Also noted to have prior macrocytosis likely in setting of alcohol use - Suspect there is a component of bone marrow suppression (esp with thrombocytopenia noted) from her chronic alcohol use   Hypokalemia -Replete as needed   Hypoalbuminemia: -Treated briefly with IV albumin   Alcohol abuse  Last drink was 5 days prior to admission, continue thiamine and folate   Old records reviewed in assessment of this patient  Antimicrobials: Rocephin 10/31/2022 >> 11/02/2022  DVT prophylaxis:   Place and maintain sequential compression device Start: 10/31/22 0723   Code Status:   Code Status: Full Code  Mobility Assessment (last 72 hours)     Mobility Assessment     Row Name 11/04/22 0900 11/02/22 1241 11/02/22 0814 11/01/22 2216 11/01/22 1617   Does patient have an order for bedrest or is patient medically unstable No - Continue assessment -- No - Continue assessment No - Continue assessment No - Continue assessment   What is the highest level of mobility based on the progressive mobility assessment? Level 3 (Stands with assist) - Balance while standing  and cannot march in place Level 3 (Stands with assist) - Balance while standing  and cannot march in place Level 5 (Walks with assist in room/hall) - Balance while stepping forward/back and can walk in room with assist - Complete Level 5 (Walks with assist in room/hall) - Balance while stepping forward/back and can walk in room with assist - Complete Level 5 (Walks with assist in room/hall) - Balance while stepping forward/back and can walk in room with assist - Complete            Barriers to discharge: Pending clinical improvement Disposition Plan: To be determined Status is: Inpatient  Objective: Blood pressure (!) 151/113, pulse 86, temperature 98 F (36.7 C), resp. rate 17, height '5\' 4"'$  (1.626 m), weight 56.7 kg, SpO2 98 %.  Examination:  Physical Exam Constitutional:      Comments: Chronically ill-appearing young adult woman laying in bed in no distress but unable to interact overall; nonverbal, not following commands  HENT:     Head: Normocephalic and atraumatic.     Mouth/Throat:     Mouth: Mucous membranes are dry.  Eyes:     Comments: Spontaneous movement of eyes but patient unable to track; no nystagmus appreciated  Cardiovascular:     Rate and Rhythm: Normal rate and regular rhythm.  Pulmonary:     Effort: Pulmonary effort is normal.     Breath sounds: Normal breath sounds.  Abdominal:     General:  Bowel sounds are normal. There is distension.     Palpations: Abdomen is soft.     Tenderness: There is no abdominal tenderness.  Musculoskeletal:        General: Swelling present. Normal range of motion.     Cervical back: Normal range of motion and neck supple.  Skin:    General: Skin is warm and dry.  Neurological:     Comments: Does not verbalize.  No spontaneous movements.  Does not follow commands.      Consultants:  GI  Procedures:    Data Reviewed: Results for orders placed or performed during the hospital encounter of 10/30/22 (from the past 24 hour(s))  Protime-INR     Status: Abnormal   Collection Time: 11/04/22  4:32 AM  Result Value Ref Range   Prothrombin Time 21.4 (H) 11.4 - 15.2 seconds   INR 1.9 (H) 0.8 - 1.2  Comprehensive metabolic panel     Status: Abnormal   Collection Time: 11/04/22  4:32 AM  Result Value Ref Range   Sodium 141 135 - 145 mmol/L   Potassium 3.1 (L) 3.5 - 5.1 mmol/L   Chloride 113 (H) 98 - 111 mmol/L   CO2 18 (L) 22 - 32  mmol/L   Glucose, Bld 114 (H) 70 - 99 mg/dL   BUN 34 (H) 6 - 20 mg/dL   Creatinine, Ser 1.22 (H) 0.44 - 1.00 mg/dL   Calcium 9.7 8.9 - 10.3 mg/dL   Total Protein 6.5 6.5 - 8.1 g/dL   Albumin 2.7 (L) 3.5 - 5.0 g/dL   AST 70 (H) 15 - 41 U/L   ALT 20 0 - 44 U/L   Alkaline Phosphatase 118 38 - 126 U/L   Total Bilirubin 14.5 (H) 0.3 - 1.2 mg/dL   GFR, Estimated 57 (L) >60 mL/min   Anion gap 10 5 - 15  CBC with Differential/Platelet     Status: Abnormal   Collection Time: 11/04/22  4:32 AM  Result Value Ref Range   WBC 18.0 (H) 4.0 - 10.5 K/uL   RBC 2.93 (L) 3.87 - 5.11 MIL/uL   Hemoglobin 9.6 (L) 12.0 - 15.0 g/dL   HCT 28.3 (L) 36.0 - 46.0 %   MCV 96.6 80.0 - 100.0 fL   MCH 32.8 26.0 - 34.0 pg   MCHC 33.9 30.0 - 36.0 g/dL   RDW 22.0 (H) 11.5 - 15.5 %   Platelets 152 150 - 400 K/uL   nRBC 0.0 0.0 - 0.2 %   Neutrophils Relative % 77 %   Neutro Abs 13.9 (H) 1.7 - 7.7 K/uL   Lymphocytes Relative 10 %   Lymphs  Abs 1.9 0.7 - 4.0 K/uL   Monocytes Relative 6 %   Monocytes Absolute 1.1 (H) 0.1 - 1.0 K/uL   Eosinophils Relative 1 %   Eosinophils Absolute 0.3 0.0 - 0.5 K/uL   Basophils Relative 1 %   Basophils Absolute 0.1 0.0 - 0.1 K/uL   Immature Granulocytes 5 %   Abs Immature Granulocytes 0.81 (H) 0.00 - 0.07 K/uL    I have Reviewed nursing notes, Vitals, and Lab results since pt's last encounter. Pertinent lab results : see above I have ordered labwork to follow up on.  I have reviewed the last note from staff over past 24 hours I have discussed pt's care plan and test results with nursing staff, CM/SW, and other staff as appropriate  Time spent: Greater than 50% of the 55 minute visit was spent in counseling/coordination of care for the patient as laid out in the A&P.   LOS: 5 days   Dwyane Dee, MD [facility] 11/04/2022, 2:53 PM

## 2022-11-04 NOTE — Progress Notes (Signed)
Patient with 3 family members at bedside currently resting quietly with eyes closed, did not take PO meds due to immediate lethargy from pain medication administration, will attempt again when patient is more awake.

## 2022-11-04 NOTE — Hospital Course (Signed)
Ms. Kozlov is a 41 yo female with PMH alcohol use disorder with dependence, depression/anxiety, alcoholic cirrhosis.  She presented with progressively worsening abdominal pain. Workup was notable for severe alcoholic hepatitis and decompensated liver failure.  GI was consulted on admission.  Infectious workup was commenced and after ruled out, she was started on steroids.

## 2022-11-05 DIAGNOSIS — K7290 Hepatic failure, unspecified without coma: Secondary | ICD-10-CM | POA: Diagnosis not present

## 2022-11-05 DIAGNOSIS — Z7189 Other specified counseling: Secondary | ICD-10-CM | POA: Diagnosis not present

## 2022-11-05 DIAGNOSIS — N179 Acute kidney failure, unspecified: Secondary | ICD-10-CM | POA: Diagnosis not present

## 2022-11-05 DIAGNOSIS — K7200 Acute and subacute hepatic failure without coma: Secondary | ICD-10-CM | POA: Diagnosis not present

## 2022-11-05 DIAGNOSIS — K7031 Alcoholic cirrhosis of liver with ascites: Secondary | ICD-10-CM | POA: Diagnosis not present

## 2022-11-05 DIAGNOSIS — Z515 Encounter for palliative care: Secondary | ICD-10-CM | POA: Diagnosis not present

## 2022-11-05 LAB — CBC WITH DIFFERENTIAL/PLATELET
Abs Immature Granulocytes: 0.72 10*3/uL — ABNORMAL HIGH (ref 0.00–0.07)
Basophils Absolute: 0.1 10*3/uL (ref 0.0–0.1)
Basophils Relative: 0 %
Eosinophils Absolute: 0 10*3/uL (ref 0.0–0.5)
Eosinophils Relative: 0 %
HCT: 28.3 % — ABNORMAL LOW (ref 36.0–46.0)
Hemoglobin: 9.4 g/dL — ABNORMAL LOW (ref 12.0–15.0)
Immature Granulocytes: 4 %
Lymphocytes Relative: 11 %
Lymphs Abs: 2.1 10*3/uL (ref 0.7–4.0)
MCH: 32.2 pg (ref 26.0–34.0)
MCHC: 33.2 g/dL (ref 30.0–36.0)
MCV: 96.9 fL (ref 80.0–100.0)
Monocytes Absolute: 1 10*3/uL (ref 0.1–1.0)
Monocytes Relative: 5 %
Neutro Abs: 16.5 10*3/uL — ABNORMAL HIGH (ref 1.7–7.7)
Neutrophils Relative %: 80 %
Platelets: 157 10*3/uL (ref 150–400)
RBC: 2.92 MIL/uL — ABNORMAL LOW (ref 3.87–5.11)
RDW: 22 % — ABNORMAL HIGH (ref 11.5–15.5)
WBC: 20.4 10*3/uL — ABNORMAL HIGH (ref 4.0–10.5)
nRBC: 0 % (ref 0.0–0.2)

## 2022-11-05 LAB — COMPREHENSIVE METABOLIC PANEL
ALT: 21 U/L (ref 0–44)
AST: 77 U/L — ABNORMAL HIGH (ref 15–41)
Albumin: 2.6 g/dL — ABNORMAL LOW (ref 3.5–5.0)
Alkaline Phosphatase: 113 U/L (ref 38–126)
Anion gap: 11 (ref 5–15)
BUN: 24 mg/dL — ABNORMAL HIGH (ref 6–20)
CO2: 19 mmol/L — ABNORMAL LOW (ref 22–32)
Calcium: 9.9 mg/dL (ref 8.9–10.3)
Chloride: 111 mmol/L (ref 98–111)
Creatinine, Ser: 0.82 mg/dL (ref 0.44–1.00)
GFR, Estimated: >60 mL/min (ref >60)
Glucose, Bld: 157 mg/dL — ABNORMAL HIGH (ref 70–99)
Potassium: 2.9 mmol/L — ABNORMAL LOW (ref 3.5–5.1)
Sodium: 141 mmol/L (ref 135–145)
Total Bilirubin: 13.5 mg/dL — ABNORMAL HIGH (ref 0.3–1.2)
Total Protein: 6.6 g/dL (ref 6.5–8.1)

## 2022-11-05 LAB — CULTURE, BLOOD (ROUTINE X 2)
Culture: NO GROWTH
Culture: NO GROWTH
Special Requests: ADEQUATE
Special Requests: ADEQUATE

## 2022-11-05 LAB — PROTIME-INR
INR: 1.8 — ABNORMAL HIGH (ref 0.8–1.2)
Prothrombin Time: 20.5 seconds — ABNORMAL HIGH (ref 11.4–15.2)

## 2022-11-05 LAB — AMMONIA: Ammonia: 103 umol/L — ABNORMAL HIGH (ref 9–35)

## 2022-11-05 LAB — MAGNESIUM: Magnesium: 1.1 mg/dL — ABNORMAL LOW (ref 1.7–2.4)

## 2022-11-05 MED ORDER — SALINE SPRAY 0.65 % NA SOLN
1.0000 | NASAL | Status: DC | PRN
  Administered 2022-11-05: 1 via NASAL
  Filled 2022-11-05: qty 44

## 2022-11-05 MED ORDER — POTASSIUM CHLORIDE 10 MEQ/100ML IV SOLN
10.0000 meq | INTRAVENOUS | Status: AC
  Administered 2022-11-05 (×6): 10 meq via INTRAVENOUS
  Filled 2022-11-05 (×4): qty 100

## 2022-11-05 MED ORDER — LACTULOSE 10 GM/15ML PO SOLN
20.0000 g | Freq: Three times a day (TID) | ORAL | Status: DC
  Administered 2022-11-05 – 2022-11-08 (×9): 20 g via ORAL
  Filled 2022-11-05 (×9): qty 30

## 2022-11-05 MED ORDER — FLUTICASONE PROPIONATE 50 MCG/ACT NA SUSP
1.0000 | Freq: Every day | NASAL | Status: DC
  Administered 2022-11-05 – 2022-11-08 (×4): 1 via NASAL
  Filled 2022-11-05: qty 16

## 2022-11-05 MED ORDER — OXYCODONE HCL 5 MG PO TABS
5.0000 mg | ORAL_TABLET | ORAL | Status: DC | PRN
  Administered 2022-11-05 – 2022-11-06 (×5): 5 mg via ORAL
  Filled 2022-11-05 (×6): qty 1

## 2022-11-05 MED ORDER — MAGNESIUM SULFATE 4 GM/100ML IV SOLN
4.0000 g | Freq: Once | INTRAVENOUS | Status: AC
  Administered 2022-11-05: 4 g via INTRAVENOUS
  Filled 2022-11-05: qty 100
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
## 2022-11-05 NOTE — Progress Notes (Signed)
Progress Note    Mallory Mendoza   RUE:[id number]  DOB: Jan 20, 1981  DOA: 10/30/2022     6 PCP: Mallory Meiers, MD  Initial CC: abdominal pain  Hospital Course: [name] is a 40 yo female with PMH alcohol use disorder with dependence, depression/anxiety, alcoholic cirrhosis.  She presented with progressively worsening abdominal pain. Workup was notable for severe alcoholic hepatitis and decompensated liver failure.  GI was consulted on admission.  Infectious workup was commenced and after ruled out, she was started on steroids.  Interval History:  No events overnight.  Daughter and family present this morning.  She is still able to swallow lactulose and taking some other medications crushed with applesauce.  Continues to have bowel movements with lactulose as well. She is moving around some more in bed today but still not talking any at least to me.  She has said a few short words to family bedside at times. Still could not follow any of my commands today.  Assessment and Plan:  Decompensated acute liver failure - MELD-Na 36 on admission  - after underlying infection ruled out, she was started on prednisolone on 11/04/2022; please inform GI or primary service if patient refuses tablet or unable to take in order to change to IV route -Not a transplant candidate due to active etoh abuse on admission  - despite improvement in MELD-Na since admission, prognosis remains poor and she remains encephalopathic; has been taking lacutlose and was started on xifaximin as well - trend CMP, NH3, and PT/INR daily - palliative care consulted for assistance with Mallory Mendoza discussions; we have communicated to daughter and family that her prognosis is poor or guarded at best unless mentation improves more too  Alcoholic hepatitis - elevated DF on admission -Prednisolone started per GI; Patietn took first dose on 11/04/2022  Acute hepatic encephalopathy - Patient became progressively lethargic  and obtunded - NH3 was 78 on admission and uptrended to 135 (peak) and now seeing some downtrend in NH3 as of 12/21; lactulose has been initiated; if unable to tolerated PO would pursue enema - BUN also noted 63 on admission and down to 24 now, but no improvement in mentation, thus suspect this is moreso due to liver failure (mentation may be lagging the improvement in ammonia but it is still rather elevated); infection has also been ruled out as well -Discontinue Dilaudid in case contributing to lethargy/mentation.  Trial of oxy in its place  AKI - baseline creatinine ~ 1 - patient presents with increase in creat >0.3 mg/dL above baseline, creat increase >1.5x baseline presumed to have occurred within past 7 days PTA - creat 8.87 on admission - differential includes prerenal vs HRS vs combo - appears to have responded to IVF, and now normalized  - continue trending renal function  -Mentation has not improved despite improvement in uremia as well  SIRS -Leukocytosis noted on admission.  UA not considered consistent with UTI nor any growth on urine culture.  Blood cultures also negative since admission - Rocephin had initially been started and was discontinued - Etiology considered due to underlying alcoholic hepatitis -Only trace ascites noted on abdominal ultrasound insufficient for paracentesis  Nausea/vomiting - probably from component of EtOH withdrawal - GI planning on possible eventual EGD if patient stabilizes/improves   Hypovolemic hyponatremia Resolved with fluid resuscitation.   Acute normocytic anemia -Has been on Protonix and octreotide (latter now d/c'd) - Also noted to have prior macrocytosis likely in setting of alcohol use - Suspect there  is a component of bone marrow suppression (esp with thrombocytopenia noted) from her chronic alcohol use   Hypokalemia -Replete as needed   Hypoalbuminemia: -Treated briefly with IV albumin   Alcohol abuse Last drink was 5 days  prior to admission, continue thiamine and folate   Old records reviewed in assessment of this patient  Antimicrobials: Rocephin 10/31/2022 >> 11/02/2022  DVT prophylaxis:  Place and maintain sequential compression device Start: 10/31/22 0723   Code Status:   Code Status: Full Code  Mobility Assessment (last 72 hours)     Mobility Assessment     Row Name 11/05/22 0743 11/04/22 0900         Does patient have an order for bedrest or is patient medically unstable No - Continue assessment No - Continue assessment      What is the highest level of mobility based on the progressive mobility assessment? Level 3 (Stands with assist) - Balance while standing  and cannot march in place Level 3 (Stands with assist) - Balance while standing  and cannot march in place               Barriers to discharge: Pending clinical improvement Disposition Plan: To be determined Status is: Inpatient  Objective: Blood pressure (!) 145/92, pulse 83, temperature 98 F (36.7 C), temperature source Oral, resp. rate 20, height '5\' 4"'$  (1.626 m), weight 56.7 kg, SpO2 100 %.  Examination:  Physical Exam Constitutional:      Comments: Chronically ill-appearing young adult woman laying in bed in no distress but unable to interact overall; nonverbal, not following commands  HENT:     Head: Normocephalic and atraumatic.     Mouth/Throat:     Mouth: Mucous membranes are dry.  Eyes:     Comments: Spontaneous movement of eyes but patient unable to track; no nystagmus appreciated  Cardiovascular:     Rate and Rhythm: Normal rate and regular rhythm.  Pulmonary:     Effort: Pulmonary effort is normal.     Breath sounds: Normal breath sounds.  Abdominal:     General: Bowel sounds are normal. There is distension.     Palpations: Abdomen is soft.     Tenderness: There is no abdominal tenderness.  Musculoskeletal:        General: Swelling present. Normal range of motion.     Cervical back: Normal range of  motion and neck supple.  Skin:    General: Skin is warm and dry.  Neurological:     Comments: Does not verbalize.  No spontaneous movements.  Does not follow commands.      Consultants:  GI  Procedures:    Data Reviewed: Results for orders placed or performed during the hospital encounter of 10/30/22 (from the past 24 hour(s))  Protime-INR     Status: Abnormal   Collection Time: 11/05/22  4:05 AM  Result Value Ref Range   Prothrombin Time 20.5 (H) 11.4 - 15.2 seconds   INR 1.8 (H) 0.8 - 1.2  Comprehensive metabolic panel     Status: Abnormal   Collection Time: 11/05/22  4:05 AM  Result Value Ref Range   Sodium 141 135 - 145 mmol/L   Potassium 2.9 (L) 3.5 - 5.1 mmol/L   Chloride 111 98 - 111 mmol/L   CO2 19 (L) 22 - 32 mmol/L   Glucose, Bld 157 (H) 70 - 99 mg/dL   BUN 24 (H) 6 - 20 mg/dL   Creatinine, Ser 0.82 0.44 - 1.00 mg/dL  Calcium 9.9 8.9 - 10.3 mg/dL   Total Protein 6.6 6.5 - 8.1 g/dL   Albumin 2.6 (L) 3.5 - 5.0 g/dL   AST 77 (H) 15 - 41 U/L   ALT 21 0 - 44 U/L   Alkaline Phosphatase 113 38 - 126 U/L   Total Bilirubin 13.5 (H) 0.3 - 1.2 mg/dL   GFR, Estimated >60 >60 mL/min   Anion gap 11 5 - 15  CBC with Differential/Platelet     Status: Abnormal   Collection Time: 11/05/22  4:05 AM  Result Value Ref Range   WBC 20.4 (H) 4.0 - 10.5 K/uL   RBC 2.92 (L) 3.87 - 5.11 MIL/uL   Hemoglobin 9.4 (L) 12.0 - 15.0 g/dL   HCT 28.3 (L) 36.0 - 46.0 %   MCV 96.9 80.0 - 100.0 fL   MCH 32.2 26.0 - 34.0 pg   MCHC 33.2 30.0 - 36.0 g/dL   RDW 22.0 (H) 11.5 - 15.5 %   Platelets 157 150 - 400 K/uL   nRBC 0.0 0.0 - 0.2 %   Neutrophils Relative % 80 %   Neutro Abs 16.5 (H) 1.7 - 7.7 K/uL   Lymphocytes Relative 11 %   Lymphs Abs 2.1 0.7 - 4.0 K/uL   Monocytes Relative 5 %   Monocytes Absolute 1.0 0.1 - 1.0 K/uL   Eosinophils Relative 0 %   Eosinophils Absolute 0.0 0.0 - 0.5 K/uL   Basophils Relative 0 %   Basophils Absolute 0.1 0.0 - 0.1 K/uL   Immature Granulocytes 4 %    Abs Immature Granulocytes 0.72 (H) 0.00 - 0.07 K/uL  Magnesium     Status: Abnormal   Collection Time: 11/05/22  4:05 AM  Result Value Ref Range   Magnesium 1.1 (L) 1.7 - 2.4 mg/dL  Ammonia     Status: Abnormal   Collection Time: 11/05/22  8:26 AM  Result Value Ref Range   Ammonia 103 (H) 9 - 35 umol/L    I have Reviewed nursing notes, Vitals, and Lab results since pt's last encounter. Pertinent lab results : see above I have ordered labwork to follow up on.  I have reviewed the last note from staff over past 24 hours I have discussed pt's care plan and test results with nursing staff, CM/SW, and other staff as appropriate  Time spent: Greater than 50% of the 55 minute visit was spent in counseling/coordination of care for the patient as laid out in the A&P.   LOS: 6 days   Dwyane Dee, MD [facility] 11/05/2022, 4:24 PM

## 2022-11-05 NOTE — Progress Notes (Signed)
Gastroenterology Progress Note   Referring Provider: No ref. provider found Primary Care Physician:  Carrolyn Meiers, MD Primary Gastroenterologist:  Elon Alas. Abbey Chatters, DO Patient ID: Mallory Mendoza; [id number]; [birth year]   Subjective:    Two sisters, mother at bedside. Patient resting. Mother states she has some broth today and got up to the bedside commode. Nursing/family report three [place] 6 stools since 3am. Patient arouses, sits up in bed but does not speak.   Objective:   Vital signs in last 24 hours: Temp:  [97.1 F (36.2 C)-97.4 F (36.3 C)] 97.4 F (36.3 C) (12/21 0310) Pulse Rate:  [84-91] 91 (12/21 1012) Resp:  [17-20] 20 (12/21 0310) BP: (151-163)/(96-113) 151/101 (12/21 1012) SpO2:  [98 %-100 %] 98 % (12/21 0310) Last BM Date : 11/05/22 General:   Lethargic in NAD. Arouses to verbal stimuli. Sits up in bed. Per nursing, she does take medication orally, but very slow. Does not verbally communicate.  Head:  Normocephalic and atraumatic. Eyes:  Sclera clear, + icterus.  Chest: CTA bilaterally without rales, rhonchi, crackles.    Heart:  Regular rate and rhythm; no murmurs, clicks, rubs,  or gallops. Abdomen:  Soft, nontender and nondistended. No masses, hepatosplenomegaly or hernias noted. Normal bowel sounds, without guarding, and without rebound.   Extremities:  Without clubbing, deformity or edema. Neurologic:  Alert, could not assess, grossly no deficits.  Skin:  Intact without significant lesions or rashes. Psych:  could not evaluate  Intake/Output from previous day: 12/20 0701 - 12/21 0700 In: 360 [P.O.:360] Out: -  Intake/Output this shift: Total I/O In: 240 [P.O.:240] Out: -   Lab Results: CBC Recent Labs    11/03/22 0526 11/04/22 0432 11/05/22 0405  WBC 15.7* 18.0* 20.4*  HGB 10.1* 9.6* 9.4*  HCT 29.9* 28.3* 28.3*  MCV 97.1 96.6 96.9  PLT 153 152 157   BMET Recent Labs    11/03/22 0528 11/04/22 0432 11/05/22 0405  NA 141  141 141  K 2.8* 3.1* 2.9*  CL 110 113* 111  CO2 18* 18* 19*  GLUCOSE 103* 114* 157*  BUN 49* 34* 24*  CREATININE 2.20* 1.22* 0.82  CALCIUM 9.0 9.7 9.9   LFTs Recent Labs    11/03/22 0528 11/04/22 0432 11/05/22 0405  BILITOT 14.9* 14.5* 13.5*  ALKPHOS 132* 118 113  AST 65* 70* 77*  ALT '17 20 21  '$ PROT 6.9 6.5 6.6  ALBUMIN 3.0* 2.7* 2.6*   No results for input(s): "LIPASE" in the last 72 hours. PT/INR Recent Labs    11/03/22 0528 11/04/22 0432 11/05/22 0405  LABPROT 19.3* 21.4* 20.5*  INR 1.7* 1.9* 1.8*         Imaging Studies: [place] ASCITES (ABDOMEN LIMITED)  Result Date: 11/02/2022 CLINICAL DATA:  Ascites EXAM: LIMITED ABDOMEN ULTRASOUND FOR ASCITES TECHNIQUE: Limited ultrasound survey for ascites was performed in all four abdominal quadrants. COMPARISON:  CT abdomen and pelvis as well as ultrasound abdomen exam 10/30/2022 FINDINGS: Trace ascites present. Volume of ascites is insufficient for paracentesis. IMPRESSION: Trace ascites, insufficient for paracentesis. Electronically Signed   By: Lavonia Dana M.D.   On: 11/02/2022 13:53   DG CHEST PORT 1 VIEW  Result Date: 10/30/2022 CLINICAL DATA:  Shortness of breath, abdominal pain, nausea, vomiting, history cirrhosis EXAM: PORTABLE CHEST 1 VIEW COMPARISON:  Portable exam 1605 hours compared to 11/13/2021 FINDINGS: Normal heart size, mediastinal contours, and pulmonary vascularity. Minimal RIGHT basilar atelectasis. Lungs otherwise clear. No pulmonary infiltrate, pleural effusion, or pneumothorax. Osseous structures  unremarkable. IMPRESSION: Minimal RIGHT basilar atelectasis. Electronically Signed   By: Lavonia Dana M.D.   On: 10/30/2022 16:21   US Abdomen Limited RUQ (LIVER/GB)  Result Date: 10/30/2022 CLINICAL DATA:  RIGHT upper quadrant abdominal pain, history cirrhosis EXAM: ULTRASOUND ABDOMEN LIMITED RIGHT UPPER QUADRANT COMPARISON:  CT abdomen and pelvis 10/30/2022 FINDINGS: Gallbladder: Contracted, with thickened wall,  nonspecific finding in the setting of ascites. No shadowing calculi or sonographic Murphy sign. Common bile duct: Diameter: 3 mm, normal Liver: Heterogeneous increased echogenicity of liver consistent with fatty infiltration seen on prior CT. No hepatic mass or nodularity. No intrahepatic biliary dilatation. Portal vein is patent on color Doppler imaging with normal direction of blood flow towards the liver. Other: Scattered ascites. IMPRESSION: Ascites. Thickened gallbladder wall, a nonspecific finding in the setting of ascites. Fatty infiltration of liver. Electronically Signed   By: Lavonia Dana M.D.   On: 10/30/2022 16:03   CT ABDOMEN PELVIS WO CONTRAST  Result Date: 10/30/2022 CLINICAL DATA:  Chronic abdominal pain with nausea and vomiting. History of cirrhosis. EXAM: CT ABDOMEN AND PELVIS WITHOUT CONTRAST TECHNIQUE: Multidetector CT imaging of the abdomen and pelvis was performed following the standard protocol without IV contrast. RADIATION DOSE REDUCTION: This exam was performed according to the departmental dose-optimization program which includes automated exposure control, adjustment of the mA and/or kV according to patient size and/or use of iterative reconstruction technique. COMPARISON:  CT November 13, 2021. FINDINGS: Lower chest: Motion degraded examination of the lungs reveals no acute abnormality. Hepatobiliary: Heterogeneous appearance of the hepatic parenchyma with interposed geographic areas of hepatic parenchymal hypodensity and hyperdensity. Hyperdense material in a nondilated gallbladder likely reflects sludge and stones. Gallbladder wall thickening with trace pericholecystic fluid is favored reactive. No biliary ductal dilation. Pancreas: No pancreatic ductal dilation or evidence of acute inflammation. Spleen: No splenomegaly. Adrenals/Urinary Tract: Bilateral adrenal glands are within normal limits. No hydronephrosis. No renal, ureteral or bladder calculi. Stomach/Bowel: Stomach is  unremarkable for degree of distension. No pathologic dilation of small or large bowel. Wall thickening of the ascending colon through the hepatic flexure favored portal colopathy. Vascular/Lymphatic: Aortic atherosclerosis. Smooth IVC contours. No pathologically enlarged abdominal or pelvic lymph nodes. Reproductive: Uterus and bilateral adnexa are unremarkable. Other: Small volume of abdominopelvic ascites. Diffuse body wall edema. Musculoskeletal: No acute osseous abnormality. IMPRESSION: 1. Heterogeneous appearance of the hepatic parenchyma with interposed geographic areas of hepatic parenchymal hypodensity and hyperdensity. Findings are favored to reflect hepatic steatosis with areas of focal fatty sparing. However, consider further evaluation with nonemergent hepatic protocol MRI with and without contrast for more definitive characterization. 2. Hyperdense material in a nondilated gallbladder likely reflects sludge and stones. Gallbladder wall thickening with trace pericholecystic fluid is favored reactive. Consider further evaluation with right upper quadrant ultrasound. 3. Wall thickening of the ascending colon through the hepatic flexure favored to reflect portal colopathy. 4. Small volume of abdominopelvic ascites compatible with portal hypertension. 5.  Aortic Atherosclerosis (ICD10-I70.0). Electronically Signed   By: Dahlia Bailiff M.D.   On: 10/30/2022 14:52  [2 weeks]  Assessment:   41 y.o. female with a history of anxiety, depression, and suspected cirrhosis secondary to chronic alcohol and possible PBC, presenting to the hospital with abdominal pain, jaundice, N/V, and admitted with SIRS, acute renal failure, alcoholic hepatitis, and acute anemia with Hgb 6.9.    SIRS: Urine culture negative.  Chest x-ray with minimal right atelectasis.  Blood cultures remain negative at 4 days.  CT with gallbladder wall thickening likely reactive,  wall thickening of the ascending colon through the hepatic  flexure most likely related to portal colopathy (no diarrhea).  Limited ascites on ultrasound.   Concern for cirrhosis: Ultrasound/CT with fatty liver, no nodularity, normal spleen. Positive AMA, ursodiol initiated previously.  Hepatitis B surface antigen and hepatitis C antibody negative in 2021.  Previously recommended to have a liver biopsy but patient was lost to follow-up. MELD 23 today.    Alcoholic hepatitis: DF 37.3 (down from 53.1) today using PT control of 13.  On day 2 of prednisolone (patient refused first dose on 11/03/22).    Acute anemia: Hemoglobin 6.9 on admission, receiving 3 units of packed red blood cells.  Hemoglobin 9.4 today.  No overt GI bleeding while inpatient.  Concern for coffee-ground emesis prior to admission.  Octreotide drip discontinued. Plan for EGD at some point based on clinical course.    Mental status changes: due to acute liver failure. Received lactulose (increased frequency) over the last 48 hours, six stools yesterday. Xifaxin '550mg'$  bid started 11/03/22. Still does not verbally communicate, confused, lethargic. Ammonia level down some. ?multifactorial    Acute renal failure: Creatinine 8.87 on admission, down to 0.82 today.      Plan:   Lactulose 30cc TID. Continue xifaxan '550mg'$  BID.  Continue prednisolone. Monitor response.  MELD daily. PPI BID.  Continue urso '300mg'$  tid. Full liquids. Consider EGD once mental status improves.   LOS: 6 days   Laureen Ochs. Bernarda Caffey [facility] [phone] 12/21/202312:48 PM

## 2022-11-06 ENCOUNTER — Encounter (HOSPITAL_COMMUNITY): Payer: Self-pay | Admitting: Family Medicine

## 2022-11-06 DIAGNOSIS — K7200 Acute and subacute hepatic failure without coma: Secondary | ICD-10-CM | POA: Diagnosis not present

## 2022-11-06 DIAGNOSIS — F1010 Alcohol abuse, uncomplicated: Secondary | ICD-10-CM | POA: Diagnosis not present

## 2022-11-06 DIAGNOSIS — N179 Acute kidney failure, unspecified: Secondary | ICD-10-CM | POA: Diagnosis not present

## 2022-11-06 LAB — CBC WITH DIFFERENTIAL/PLATELET
Abs Immature Granulocytes: 0.71 10*3/uL — ABNORMAL HIGH (ref 0.00–0.07)
Basophils Absolute: 0.1 10*3/uL (ref 0.0–0.1)
Basophils Relative: 0 %
Eosinophils Absolute: 0.3 10*3/uL (ref 0.0–0.5)
Eosinophils Relative: 1 %
HCT: 27.9 % — ABNORMAL LOW (ref 36.0–46.0)
Hemoglobin: 9.5 g/dL — ABNORMAL LOW (ref 12.0–15.0)
Immature Granulocytes: 3 %
Lymphocytes Relative: 10 %
Lymphs Abs: 2.6 10*3/uL (ref 0.7–4.0)
MCH: 32.6 pg (ref 26.0–34.0)
MCHC: 34.1 g/dL (ref 30.0–36.0)
MCV: 95.9 fL (ref 80.0–100.0)
Monocytes Absolute: 1.5 10*3/uL — ABNORMAL HIGH (ref 0.1–1.0)
Monocytes Relative: 5 %
Neutro Abs: 22.3 10*3/uL — ABNORMAL HIGH (ref 1.7–7.7)
Neutrophils Relative %: 81 %
Platelets: 157 10*3/uL (ref 150–400)
RBC: 2.91 MIL/uL — ABNORMAL LOW (ref 3.87–5.11)
RDW: 21.9 % — ABNORMAL HIGH (ref 11.5–15.5)
WBC: 27.5 10*3/uL — ABNORMAL HIGH (ref 4.0–10.5)
nRBC: 0.1 % (ref 0.0–0.2)

## 2022-11-06 LAB — MAGNESIUM: Magnesium: 1.4 mg/dL — ABNORMAL LOW (ref 1.7–2.4)

## 2022-11-06 LAB — COMPREHENSIVE METABOLIC PANEL
ALT: 24 U/L (ref 0–44)
AST: 81 U/L — ABNORMAL HIGH (ref 15–41)
Albumin: 2.6 g/dL — ABNORMAL LOW (ref 3.5–5.0)
Alkaline Phosphatase: 109 U/L (ref 38–126)
Anion gap: 7 (ref 5–15)
BUN: 17 mg/dL (ref 6–20)
CO2: 20 mmol/L — ABNORMAL LOW (ref 22–32)
Calcium: 9.2 mg/dL (ref 8.9–10.3)
Chloride: 109 mmol/L (ref 98–111)
Creatinine, Ser: 0.66 mg/dL (ref 0.44–1.00)
GFR, Estimated: >60 mL/min (ref >60)
Glucose, Bld: 122 mg/dL — ABNORMAL HIGH (ref 70–99)
Potassium: 3.3 mmol/L — ABNORMAL LOW (ref 3.5–5.1)
Sodium: 136 mmol/L (ref 135–145)
Total Bilirubin: 12.3 mg/dL — ABNORMAL HIGH (ref 0.3–1.2)
Total Protein: 6.6 g/dL (ref 6.5–8.1)

## 2022-11-06 LAB — AMMONIA: Ammonia: 98 umol/L — ABNORMAL HIGH (ref 9–35)

## 2022-11-06 LAB — PROTIME-INR
INR: 1.6 — ABNORMAL HIGH (ref 0.8–1.2)
Prothrombin Time: 19.1 seconds — ABNORMAL HIGH (ref 11.4–15.2)

## 2022-11-06 MED ORDER — MAGNESIUM SULFATE 4 GM/100ML IV SOLN
4.0000 g | Freq: Once | INTRAVENOUS | Status: AC
  Administered 2022-11-06: 4 g via INTRAVENOUS
  Filled 2022-11-06: qty 100

## 2022-11-06 MED ORDER — POTASSIUM CHLORIDE 10 MEQ/100ML IV SOLN
10.0000 meq | INTRAVENOUS | Status: AC
  Administered 2022-11-06 (×4): 10 meq via INTRAVENOUS
  Filled 2022-11-06 (×4): qty 100

## 2022-11-06 MED ORDER — SODIUM CHLORIDE 0.9 % IV SOLN
12.5000 mg | Freq: Four times a day (QID) | INTRAVENOUS | Status: DC | PRN
  Administered 2022-11-07: 12.5 mg via INTRAVENOUS
  Filled 2022-11-06: qty 0.5

## 2022-11-06 NOTE — Progress Notes (Signed)
     Referral previously received for Mallory Mendoza for goals of care discussion. Noted most recent palliative in-person assessment dated 11/05/22 at which time it was recommended to follow from a distance/chart check.   Chart reviewed for Recent provider notes, nurse notes, TOC notes, vitals, labs, and imaging and updates received from RN. No recent imaging noted.  At this time patient appears stable/improved. More awake, labs improved (except WBC likely due to steroids). No plan for in person follow-up at this time. Plan follow-up day 7 of steroids after Lille Score for more discussions. Chart check, time for outcomes until then.   Please contact the palliative medicine provider on service for any new/urgent needs that require our assistance with this patient.  Thank you for your referral and allowing PMT to assist in Mallory Mendoza care.   Mallory Field, NP Palliative Medicine Team Phone: [phone]  NO CHARGE

## 2022-11-06 NOTE — Progress Notes (Signed)
Gastroenterology Progress Note   Referring Provider: No ref. provider found Primary Care Physician:  Carrolyn Meiers, MD Primary Gastroenterologist:  Elon Alas. Abbey Chatters, DO  Patient ID: Mallory Mendoza; [id number]; 1981-09-26    Subjective   More alert today.  Denies any abdominal pain.  Having some occasional nausea and vomiting.  Family present with her in the room.  Having multiple loose bowel movements per day.  Able to have some broth without any difficulty.  Tolerating ice chips in between.   Objective   Vital signs in last 24 hours Temp:  [96.6 F (35.9 C)-98 F (36.7 C)] 96.6 F (35.9 C) (12/21 2012) Pulse Rate:  [78-91] 78 (12/21 2354) Resp:  [16] 16 (12/21 2012) BP: (145-162)/(92-101) 158/97 (12/21 2354) SpO2:  [100 %] 100 % (12/21 2012) Last BM Date : 11/05/22  Physical Exam General:   Alert. Pleasant. NAD.  Jaundice.  Sitting up in bed Head:  Normocephalic and atraumatic. Eyes: Scleral icterus. Mouth:  Without lesions, mucosa pink and moist.  Heart:  S1, S2 present, no murmurs noted.  Lungs: Clear to auscultation bilaterally, without wheezing, rales, or rhonchi.  Abdomen:  Bowel sounds present, soft, mild distention.  Mild diffuse tenderness.  No HSM or hernias noted. No rebound or guarding. No masses appreciated  Extremities:  Without clubbing or edema. Neurologic:  Alert.  Oriented x 3, disoriented to time.  Grossly normal neurologically.  Mild asterixis Skin:  Warm and dry, intact without significant lesions.  Psych:  Alert and cooperative.  Intake/Output from previous day: 12/21 0701 - 12/22 0700 In: 1160.1 [P.O.:720; IV Piggyback:440.1] Out: -  Intake/Output this shift: No intake/output data recorded.  Lab Results  Recent Labs    11/04/22 0432 11/05/22 0405 11/06/22 0642  WBC 18.0* 20.4* 27.5*  HGB 9.6* 9.4* 9.5*  HCT 28.3* 28.3* 27.9*  PLT 152 157 157   BMET Recent Labs    11/04/22 0432 11/05/22 0405 11/06/22 0642  NA 141 141  136  K 3.1* 2.9* 3.3*  CL 113* 111 109  CO2 18* 19* 20*  GLUCOSE 114* 157* 122*  BUN 34* 24* 17  CREATININE 1.22* 0.82 0.66  CALCIUM 9.7 9.9 9.2   LFT Recent Labs    11/04/22 0432 11/05/22 0405 11/06/22 0642  PROT 6.5 6.6 6.6  ALBUMIN 2.7* 2.6* 2.6*  AST 70* 77* 81*  ALT '20 21 24  '$ ALKPHOS 118 113 109  BILITOT 14.5* 13.5* 12.3*   PT/INR Recent Labs    11/05/22 0405 11/06/22 0642  LABPROT 20.5* 19.1*  INR 1.8* 1.6*   Hepatitis Panel No results for input(s): "HEPBSAG", "HCVAB", "HEPAIGM", "HEPBIGM" in the last 72 hours.   Studies/Results [place] ASCITES (ABDOMEN LIMITED)  Result Date: 11/02/2022 CLINICAL DATA:  Ascites EXAM: LIMITED ABDOMEN ULTRASOUND FOR ASCITES TECHNIQUE: Limited ultrasound survey for ascites was performed in all four abdominal quadrants. COMPARISON:  CT abdomen and pelvis as well as ultrasound abdomen exam 10/30/2022 FINDINGS: Trace ascites present. Volume of ascites is insufficient for paracentesis. IMPRESSION: Trace ascites, insufficient for paracentesis. Electronically Signed   By: Lavonia Dana M.D.   On: 11/02/2022 13:53   DG CHEST PORT 1 VIEW  Result Date: 10/30/2022 CLINICAL DATA:  Shortness of breath, abdominal pain, nausea, vomiting, history cirrhosis EXAM: PORTABLE CHEST 1 VIEW COMPARISON:  Portable exam 1605 hours compared to 11/13/2021 FINDINGS: Normal heart size, mediastinal contours, and pulmonary vascularity. Minimal RIGHT basilar atelectasis. Lungs otherwise clear. No pulmonary infiltrate, pleural effusion, or pneumothorax. Osseous structures unremarkable. IMPRESSION: Minimal  RIGHT basilar atelectasis. Electronically Signed   By: Lavonia Dana M.D.   On: 10/30/2022 16:21   US Abdomen Limited RUQ (LIVER/GB)  Result Date: 10/30/2022 CLINICAL DATA:  RIGHT upper quadrant abdominal pain, history cirrhosis EXAM: ULTRASOUND ABDOMEN LIMITED RIGHT UPPER QUADRANT COMPARISON:  CT abdomen and pelvis 10/30/2022 FINDINGS: Gallbladder: Contracted, with  thickened wall, nonspecific finding in the setting of ascites. No shadowing calculi or sonographic Murphy sign. Common bile duct: Diameter: 3 mm, normal Liver: Heterogeneous increased echogenicity of liver consistent with fatty infiltration seen on prior CT. No hepatic mass or nodularity. No intrahepatic biliary dilatation. Portal vein is patent on color Doppler imaging with normal direction of blood flow towards the liver. Other: Scattered ascites. IMPRESSION: Ascites. Thickened gallbladder wall, a nonspecific finding in the setting of ascites. Fatty infiltration of liver. Electronically Signed   By: Lavonia Dana M.D.   On: 10/30/2022 16:03   CT ABDOMEN PELVIS WO CONTRAST  Result Date: 10/30/2022 CLINICAL DATA:  Chronic abdominal pain with nausea and vomiting. History of cirrhosis. EXAM: CT ABDOMEN AND PELVIS WITHOUT CONTRAST TECHNIQUE: Multidetector CT imaging of the abdomen and pelvis was performed following the standard protocol without IV contrast. RADIATION DOSE REDUCTION: This exam was performed according to the departmental dose-optimization program which includes automated exposure control, adjustment of the mA and/or kV according to patient size and/or use of iterative reconstruction technique. COMPARISON:  CT November 13, 2021. FINDINGS: Lower chest: Motion degraded examination of the lungs reveals no acute abnormality. Hepatobiliary: Heterogeneous appearance of the hepatic parenchyma with interposed geographic areas of hepatic parenchymal hypodensity and hyperdensity. Hyperdense material in a nondilated gallbladder likely reflects sludge and stones. Gallbladder wall thickening with trace pericholecystic fluid is favored reactive. No biliary ductal dilation. Pancreas: No pancreatic ductal dilation or evidence of acute inflammation. Spleen: No splenomegaly. Adrenals/Urinary Tract: Bilateral adrenal glands are within normal limits. No hydronephrosis. No renal, ureteral or bladder calculi. Stomach/Bowel:  Stomach is unremarkable for degree of distension. No pathologic dilation of small or large bowel. Wall thickening of the ascending colon through the hepatic flexure favored portal colopathy. Vascular/Lymphatic: Aortic atherosclerosis. Smooth IVC contours. No pathologically enlarged abdominal or pelvic lymph nodes. Reproductive: Uterus and bilateral adnexa are unremarkable. Other: Small volume of abdominopelvic ascites. Diffuse body wall edema. Musculoskeletal: No acute osseous abnormality. IMPRESSION: 1. Heterogeneous appearance of the hepatic parenchyma with interposed geographic areas of hepatic parenchymal hypodensity and hyperdensity. Findings are favored to reflect hepatic steatosis with areas of focal fatty sparing. However, consider further evaluation with nonemergent hepatic protocol MRI with and without contrast for more definitive characterization. 2. Hyperdense material in a nondilated gallbladder likely reflects sludge and stones. Gallbladder wall thickening with trace pericholecystic fluid is favored reactive. Consider further evaluation with right upper quadrant ultrasound. 3. Wall thickening of the ascending colon through the hepatic flexure favored to reflect portal colopathy. 4. Small volume of abdominopelvic ascites compatible with portal hypertension. 5.  Aortic Atherosclerosis (ICD10-I70.0). Electronically Signed   By: Dahlia Bailiff M.D.   On: 10/30/2022 14:52    Assessment  40 y.o. female with a history of anxiety, depression, suspected cirrhosis secondary to alcohol and possible PBC presented to the hospital with abdominal pain, jaundice, N/V, and hematemesis that was admitted for sepsis, acute renal failure, alcoholic hepatitis, and acute anemia.  GI consulted for further evaluation and management of alcoholic hepatitis.  SIRS: Urine culture negative.  CXR with minimal right atelectasis.  Blood cultures negative x 5 days.  CT with gallbladder wall thickening, likely reactive,  wall  thickening of ascending colon and hepatic flexure most likely related to portal colopathy.  No diarrhea.  Limited ascites on ultrasound.  Concern for cirrhosis: Ultrasound/CT with fatty liver, no nodularity.  Normal spleen.  Previous workup with positive AMA and previously started on ursodiol 300 mg 3 times daily.  Negative for hepatitis B and hepatitis C in 2021.  Previous recommendation to have liver biopsy but was lost to follow-up.  MELD 3.0: 25, MELD Na: 22.   Alcoholic hepatitis: Currently on day 3 of prednisolone. DF 40.4 (Improved from 53.1, 48) with PT control 13.  Acute anemia: Hemoglobin 6.9 on admission, received 3 units PRBCs.  Hemoglobin stable at 9.5 today.  Reports of coffee-ground emesis prior to admission was initially on octreotide drip.  This was discontinued and she has not had any overt GI bleeding while inpatient.  Plan will be for EGD when she has remained stable from mental status standpoint.   Altered mental status/hepatic encephalopathy: Likely secondary to acute liver failure.  Has been receiving lactulose 3 times daily.  Having 4-6 bowel movements daily.  Xifaxan 550 mg started 11/03/2022.  Previously noted to be nonverbal, confused, lethargic the last 48 hours however has made significant improvement today and is able to have conversation and answer questions adequately.  Ammonia level down to 98 today.  Acute renal failure: Creatinine 8.87 on admission.  Normalized to 0.66 today.    Plan / Recommendations  Daily MELD labs PPI twice daily Ursodiol 300 mg 3 times daily Lactulose 30 cc 3 times daily Continue Xifaxan 550 mg twice daily Continue prednisolone 40 mg daily, [place] score due 12/27 Continue full liquids If any further change in mental status and unable to tolerate diet, would consider tube feeds for nutrition. EGD once stable for mental status standpoint.    LOS: 7 days    11/06/2022, 12:28 PM   Venetia Night, MSN, FNP-BC, AGACNP-BC [facility]

## 2022-11-06 NOTE — Progress Notes (Signed)
Patient vital signs stable, family at bedside assisting with ADLs. Patient more alert today per report. Took medications well with encouragement after admin of zofran IV.

## 2022-11-06 NOTE — Progress Notes (Signed)
Patients family did not want vitals or 0400 labs to be drawn. Will try and come back and get labs at 0700.

## 2022-11-06 NOTE — Plan of Care (Signed)
  Problem: Acute Rehab PT Goals(only PT should resolve) Goal: Pt will Roll Supine to Side Outcome: Progressing Flowsheets (Taken 11/06/2022 1448) Pt will Roll Supine to Side:  with modified independence  with supervision Goal: Patient Will Transfer Sit To/From Stand Outcome: Progressing [place] (Taken 11/06/2022 1448) Patient will transfer sit to/from stand: with supervision Goal: Pt Will Transfer Bed To Chair/Chair To Bed Outcome: Progressing Flowsheets (Taken 11/06/2022 1448) Pt will Transfer Bed to Chair/Chair to Bed:  with supervision  min guard assist Goal: Pt Will Ambulate Outcome: Progressing Flowsheets (Taken 11/06/2022 1448) Pt will Ambulate:  50 feet  with min guard assist  with least restrictive assistive device   2:49 PM, 11/06/22 Lonell Grandchild, MPT Physical Therapist with [facility] [phone] office [phone] mobile phone

## 2022-11-06 NOTE — Progress Notes (Signed)
Progress Note    Mallory Mendoza   KNL:[id number]  DOB: [birth year]  DOA: 10/30/2022     7 PCP: Carrolyn Meiers, MD  Initial CC: abdominal pain  Hospital Course: Mallory Mendoza is a 41 yo female with PMH alcohol use disorder with dependence, depression/anxiety, alcoholic cirrhosis.  She presented with progressively worsening abdominal pain. Workup was notable for severe alcoholic hepatitis and decompensated liver failure.  GI was consulted on admission.  Infectious workup was commenced and after ruled out, she was started on steroids.  Interval History:  Pt is more alert and interactive with family today.  Ammonia trending down.   Assessment and Plan:  Decompensated acute liver failure - MELD-Na 36 on admission  - after underlying infection ruled out, she was started on prednisolone on 11/04/2022; please inform GI or primary service if patient refuses tablet or unable to take in order to change to IV route -Not a transplant candidate due to active etoh abuse on admission  - despite improvement in MELD-Na since admission, prognosis remains poor and she remains encephalopathic; has been taking lacutlose and was started on xifaximin as well - trend CMP, NH3, and PT/INR daily - palliative care consulted for assistance with [place] discussions; we have communicated to daughter and family that her prognosis is poor or guarded at best unless mentation improves more too - palliative team to reassess after 7 days of prednisolone therapy  Alcoholic hepatitis - elevated DF on admission -Prednisolone started per GI; Pt took first dose on 11/04/2022  Acute hepatic encephalopathy - Patient became progressively lethargic and obtunded - Ammonia was 78 on admission and uptrended to 135 (peak) and now seeing some downtrend; lactulose has been initiated; if unable to tolerated PO would pursue enema or NG administration - BUN also noted 63 on admission and down to 24 now, but no improvement in  mentation, thus suspect this is moreso due to liver failure (mentation may be lagging the improvement in ammonia but it is still rather elevated); infection has also been ruled out as well -Discontinued dilaudid in case contributing to lethargy/mentation.  Trial of oxy in its place  AKI - treated and resolved  - baseline creatinine ~ 1 - patient presents with increase in creat >0.3 mg/dL above baseline, creat increase >1.5x baseline presumed to have occurred within past 7 days PTA - creat 8.87 on admission - differential includes prerenal vs HRS vs combo - appears to have responded to IVF, and now normalized  - continue trending renal function  -Mentation slowly improved  SIRS -Leukocytosis noted on admission.  UA not considered consistent with UTI nor any growth on urine culture.  Blood cultures also negative since admission - Rocephin had initially been started and was discontinued - Etiology considered due to underlying alcoholic hepatitis -Only trace ascites noted on abdominal ultrasound insufficient for paracentesis  Nausea/vomiting - probably from component of EtOH withdrawal - GI planning on possible eventual EGD when patient stabilizes/improves   Hypovolemic hyponatremia Resolved with fluid resuscitation.   Acute normocytic anemia -Has been on Protonix and octreotide (latter now d/c'd) - Also noted to have prior macrocytosis likely in setting of alcohol use - Suspect there is a component of bone marrow suppression (esp with thrombocytopenia noted) from her chronic alcohol use   Hypokalemia -Replete as needed   Hypoalbuminemia: -Treated briefly with IV albumin   Alcohol abuse Last drink was 5 days prior to admission, continue thiamine and folate Monitor for acute alcohol withdrawal symptoms  Old records  reviewed in assessment of this patient  Antimicrobials: Rocephin 10/31/2022 >> 11/02/2022  DVT prophylaxis:  Place and maintain sequential compression device  Start: 10/31/22 0723   Code Status:   Code Status: Full Code  Mobility Assessment (last 72 hours)     Mobility Assessment     Row Name 11/06/22 0900 11/05/22 2209 11/05/22 0743 11/04/22 0900     Does patient have an order for bedrest or is patient medically unstable No - Continue assessment No - Continue assessment No - Continue assessment No - Continue assessment    What is the highest level of mobility based on the progressive mobility assessment? Level 3 (Stands with assist) - Balance while standing  and cannot march in place Level 3 (Stands with assist) - Balance while standing  and cannot march in place Level 3 (Stands with assist) - Balance while standing  and cannot march in place Level 3 (Stands with assist) - Balance while standing  and cannot march in place    Is the above level different from baseline mobility prior to current illness? Yes - Recommend PT order Yes - Recommend PT order -- --             Barriers to discharge: Pending clinical improvement Disposition Plan: To be determined Status is: Inpatient  Objective: Blood pressure (!) 143/94, pulse 81, temperature 98.7 F (37.1 C), temperature source Oral, resp. rate 15, height '5\' 4"'$  (1.626 m), weight 56.7 kg, SpO2 100 %.  Examination:  Physical Exam Constitutional:      Appearance: She is toxic-appearing.     Comments: Chronically ill-appearing young adult woman laying in bed in no distress but unable to interact overall; nonverbal, not following commands  HENT:     Head: Normocephalic and atraumatic.     Mouth/Throat:     Mouth: Mucous membranes are dry.  Eyes:     Comments: Spontaneous movement of eyes but patient unable to track; no nystagmus appreciated  Cardiovascular:     Rate and Rhythm: Normal rate and regular rhythm.  Pulmonary:     Effort: Pulmonary effort is normal.     Breath sounds: Normal breath sounds.  Abdominal:     General: Bowel sounds are normal. There is distension.     Palpations:  Abdomen is soft.     Tenderness: There is no abdominal tenderness.  Musculoskeletal:        General: Swelling present. Normal range of motion.     Cervical back: Normal range of motion and neck supple.  Skin:    General: Skin is warm and dry.  Neurological:     Mental Status: She is disoriented.     Comments: Does not verbalize.  No spontaneous movements.  Does not follow commands.      Consultants:  GI  Procedures:    Data Reviewed: Results for orders placed or performed during the hospital encounter of 10/30/22 (from the past 24 hour(s))  CBC with Differential/Platelet     Status: Abnormal   Collection Time: 11/06/22  6:42 AM  Result Value Ref Range   WBC 27.5 (H) 4.0 - 10.5 K/uL   RBC 2.91 (L) 3.87 - 5.11 MIL/uL   Hemoglobin 9.5 (L) 12.0 - 15.0 g/dL   HCT 27.9 (L) 36.0 - 46.0 %   MCV 95.9 80.0 - 100.0 fL   MCH 32.6 26.0 - 34.0 pg   MCHC 34.1 30.0 - 36.0 g/dL   RDW 21.9 (H) 11.5 - 15.5 %   Platelets 157 150 -  400 K/uL   nRBC 0.1 0.0 - 0.2 %   Neutrophils Relative % 81 %   Neutro Abs 22.3 (H) 1.7 - 7.7 K/uL   Lymphocytes Relative 10 %   Lymphs Abs 2.6 0.7 - 4.0 K/uL   Monocytes Relative 5 %   Monocytes Absolute 1.5 (H) 0.1 - 1.0 K/uL   Eosinophils Relative 1 %   Eosinophils Absolute 0.3 0.0 - 0.5 K/uL   Basophils Relative 0 %   Basophils Absolute 0.1 0.0 - 0.1 K/uL   Immature Granulocytes 3 %   Abs Immature Granulocytes 0.71 (H) 0.00 - 0.07 K/uL  Magnesium     Status: Abnormal   Collection Time: 11/06/22  6:42 AM  Result Value Ref Range   Magnesium 1.4 (L) 1.7 - 2.4 mg/dL  Comprehensive metabolic panel     Status: Abnormal   Collection Time: 11/06/22  6:42 AM  Result Value Ref Range   Sodium 136 135 - 145 mmol/L   Potassium 3.3 (L) 3.5 - 5.1 mmol/L   Chloride 109 98 - 111 mmol/L   CO2 20 (L) 22 - 32 mmol/L   Glucose, Bld 122 (H) 70 - 99 mg/dL   BUN 17 6 - 20 mg/dL   Creatinine, Ser 0.66 0.44 - 1.00 mg/dL   Calcium 9.2 8.9 - 10.3 mg/dL   Total Protein 6.6  6.5 - 8.1 g/dL   Albumin 2.6 (L) 3.5 - 5.0 g/dL   AST 81 (H) 15 - 41 U/L   ALT 24 0 - 44 U/L   Alkaline Phosphatase 109 38 - 126 U/L   Total Bilirubin 12.3 (H) 0.3 - 1.2 mg/dL   GFR, Estimated >60 >60 mL/min   Anion gap 7 5 - 15  Protime-INR     Status: Abnormal   Collection Time: 11/06/22  6:42 AM  Result Value Ref Range   Prothrombin Time 19.1 (H) 11.4 - 15.2 seconds   INR 1.6 (H) 0.8 - 1.2  Ammonia     Status: Abnormal   Collection Time: 11/06/22  6:42 AM  Result Value Ref Range   Ammonia 98 (H) 9 - 35 umol/L    I have Reviewed nursing notes, Vitals, and Lab results since pt's last encounter. Pertinent lab results : see above I have ordered labwork to follow up on.  I have reviewed the last note from staff over past 24 hours I have discussed pt's care plan and test results with nursing staff, CM/SW, and other staff as appropriate  Time spent: Greater than 50% of the 55 minute visit was spent in counseling/coordination of care for the patient as laid out in the A&P.   LOS: 7 days   Irwin Brakeman, MD [facility] 11/06/2022, 5:23 PM

## 2022-11-06 NOTE — Progress Notes (Signed)
PT Cancellation Note  Patient Details Name: Mallory Mendoza MRN: [id number] DOB: 1980-12-31   Cancelled Treatment:    Reason Eval/Treat Not Completed: Fatigue/lethargy limiting ability to participate.  Patient presents lethargic after taking pain medication and declined to participate after 2 attempts in AM and PM.  Patient's family present in room and states they have been walking patient in room and feel comfortable taking her home when ready to discharge.   2:47 PM, 11/06/22 Lonell Grandchild, MPT Physical Therapist with [facility] [phone] office [phone] mobile phone

## 2022-11-07 DIAGNOSIS — K7011 Alcoholic hepatitis with ascites: Secondary | ICD-10-CM | POA: Diagnosis not present

## 2022-11-07 DIAGNOSIS — K7031 Alcoholic cirrhosis of liver with ascites: Secondary | ICD-10-CM | POA: Diagnosis not present

## 2022-11-07 DIAGNOSIS — K7200 Acute and subacute hepatic failure without coma: Secondary | ICD-10-CM | POA: Diagnosis not present

## 2022-11-07 DIAGNOSIS — D649 Anemia, unspecified: Secondary | ICD-10-CM | POA: Diagnosis not present

## 2022-11-07 LAB — COMPREHENSIVE METABOLIC PANEL
ALT: 24 U/L (ref 0–44)
AST: 86 U/L — ABNORMAL HIGH (ref 15–41)
Albumin: 2.3 g/dL — ABNORMAL LOW (ref 3.5–5.0)
Alkaline Phosphatase: 96 U/L (ref 38–126)
Anion gap: 7 (ref 5–15)
BUN: 13 mg/dL (ref 6–20)
CO2: 21 mmol/L — ABNORMAL LOW (ref 22–32)
Calcium: 9 mg/dL (ref 8.9–10.3)
Chloride: 109 mmol/L (ref 98–111)
Creatinine, Ser: 0.44 mg/dL (ref 0.44–1.00)
GFR, Estimated: >60 mL/min (ref >60)
Glucose, Bld: 118 mg/dL — ABNORMAL HIGH (ref 70–99)
Potassium: 3.1 mmol/L — ABNORMAL LOW (ref 3.5–5.1)
Sodium: 137 mmol/L (ref 135–145)
Total Bilirubin: 12.1 mg/dL — ABNORMAL HIGH (ref 0.3–1.2)
Total Protein: 5.9 g/dL — ABNORMAL LOW (ref 6.5–8.1)

## 2022-11-07 LAB — MAGNESIUM: Magnesium: 1.4 mg/dL — ABNORMAL LOW (ref 1.7–2.4)

## 2022-11-07 LAB — PROTIME-INR
INR: 1.6 — ABNORMAL HIGH (ref 0.8–1.2)
Prothrombin Time: 19 seconds — ABNORMAL HIGH (ref 11.4–15.2)

## 2022-11-07 LAB — HEMOGLOBIN AND HEMATOCRIT, BLOOD
HCT: 25.5 % — ABNORMAL LOW (ref 36.0–46.0)
Hemoglobin: 8.7 g/dL — ABNORMAL LOW (ref 12.0–15.0)

## 2022-11-07 MED ORDER — PROMETHAZINE HCL 25 MG/ML IJ SOLN
INTRAMUSCULAR | Status: AC
  Filled 2022-11-07: qty 1

## 2022-11-07 MED ORDER — OXYCODONE HCL 5 MG PO TABS
5.0000 mg | ORAL_TABLET | Freq: Four times a day (QID) | ORAL | Status: DC | PRN
  Administered 2022-11-07 – 2022-11-08 (×5): 5 mg via ORAL
  Filled 2022-11-07 (×5): qty 1

## 2022-11-07 MED ORDER — SODIUM CHLORIDE 0.9 % IV SOLN
6.2500 mg | Freq: Four times a day (QID) | INTRAVENOUS | Status: DC | PRN
  Administered 2022-11-07: 6.25 mg via INTRAVENOUS
  Filled 2022-11-07: qty 0.25

## 2022-11-07 MED ORDER — FENTANYL CITRATE PF 50 MCG/ML IJ SOSY: 12.5000 ug | PREFILLED_SYRINGE | INTRAMUSCULAR | Status: DC | PRN

## 2022-11-07 MED ORDER — POTASSIUM CHLORIDE 10 MEQ/100ML IV SOLN
10.0000 meq | INTRAVENOUS | Status: AC
  Administered 2022-11-07 (×5): 10 meq via INTRAVENOUS
  Filled 2022-11-07 (×5): qty 100

## 2022-11-07 MED ORDER — ENSURE ENLIVE PO LIQD
237.0000 mL | Freq: Two times a day (BID) | ORAL | Status: DC
  Administered 2022-11-07 – 2022-11-08 (×3): 237 mL via ORAL

## 2022-11-07 MED ORDER — MAGNESIUM SULFATE 4 GM/100ML IV SOLN
4.0000 g | Freq: Once | INTRAVENOUS | Status: AC
  Administered 2022-11-07: 4 g via INTRAVENOUS
  Filled 2022-11-07: qty 100

## 2022-11-07 NOTE — Progress Notes (Signed)
Progress Note    Mallory Mendoza   ZDG:[id number]  DOB: October 24, 1981  DOA: 10/30/2022     8 PCP: Carrolyn Meiers, MD  Initial CC: abdominal pain  Hospital Course: Mallory Mendoza is a 41 yo female with PMH alcohol use disorder with dependence, depression/anxiety, alcoholic cirrhosis.  She presented with progressively worsening abdominal pain. Workup was notable for severe alcoholic hepatitis and decompensated liver failure.  GI was consulted on admission.  Infectious workup was commenced and after ruled out, she was started on steroids.  Interval History:  Pt is more alert and interactive with family today.  Ammonia trending down.   Assessment and Plan:  Decompensated acute liver failure - MELD-Na 36 on admission  - after underlying infection ruled out, she was started on prednisolone on 11/04/2022; please inform GI or primary service if patient refuses tablet or unable to take in order to change to IV route -Not a transplant candidate due to active etoh abuse on admission  - despite improvement in MELD-Na since admission, prognosis remains poor and she remains encephalopathic; has been taking lacutlose and was started on xifaximin as well - trend CMP, NH3, and PT/INR daily - palliative care consulted for assistance with [place] discussions; we have communicated to daughter and family that her prognosis is poor or guarded at best unless mentation improves more too - palliative team to reassess after 7 days of prednisolone therapy  Alcoholic hepatitis - elevated DF on admission -Prednisolone started per GI; Pt took first dose on 11/04/2022  Acute hepatic encephalopathy - Patient became progressively lethargic and obtunded - Ammonia was 78 on admission and uptrended to 135 (peak) and now seeing some downtrend; lactulose has been initiated; if unable to tolerated PO would pursue enema or NG administration - BUN also noted 63 on admission and down to 24 now, but no improvement in  mentation, thus suspect this is moreso due to liver failure (mentation may be lagging the improvement in ammonia but it is still rather elevated); infection has also been ruled out as well -Discontinued dilaudid in case contributing to lethargy/mentation. -pt already asking for pain medication, caution with pain medications advised    AKI - treated and resolved  - baseline creatinine ~ 1 - patient presents with increase in creat >0.3 mg/dL above baseline, creat increase >1.5x baseline presumed to have occurred within past 7 days PTA - creat 8.87 on admission - differential includes prerenal vs HRS vs combo - appears to have responded to IVF, and now normalized  - continue trending renal function  -Mentation slowly improving  SIRS -Leukocytosis noted on admission.  UA not considered consistent with UTI nor any growth on urine culture.  Blood cultures also negative since admission - Rocephin had initially been started and was discontinued - Etiology considered due to underlying alcoholic hepatitis -Only trace ascites noted on abdominal ultrasound insufficient for paracentesis  Nausea/vomiting - probably from component of EtOH withdrawal - GI planning on possible eventual EGD when patient stabilizes/improves   Hypovolemic hyponatremia Resolved with fluid resuscitation.   Acute normocytic anemia -Has been on Protonix and octreotide (latter now d/c'd) - Also noted to have prior macrocytosis likely in setting of alcohol use - Suspect there is a component of bone marrow suppression (esp with thrombocytopenia noted) from her chronic alcohol use   Hypokalemia -additional K ordered and additional Mg given   Hypomagnesemia - IV magnesium given    Hypoalbuminemia: -Treated briefly with IV albumin   Alcohol abuse Last drink was  5 days prior to admission, continue thiamine and folate Monitor for acute alcohol withdrawal symptoms  Old records reviewed in assessment of this  patient  Antimicrobials: Rocephin 10/31/2022 >> 11/02/2022  DVT prophylaxis:  Place and maintain sequential compression device Start: 10/31/22 0723   Code Status:   Code Status: Full Code  Mobility Assessment (last 72 hours)     Mobility Assessment     Row Name 11/06/22 2300 11/06/22 0900 11/05/22 2209 11/05/22 0743     Does patient have an order for bedrest or is patient medically unstable No - Continue assessment No - Continue assessment No - Continue assessment No - Continue assessment    What is the highest level of mobility based on the progressive mobility assessment? Level 3 (Stands with assist) - Balance while standing  and cannot march in place Level 3 (Stands with assist) - Balance while standing  and cannot march in place Level 3 (Stands with assist) - Balance while standing  and cannot march in place Level 3 (Stands with assist) - Balance while standing  and cannot march in place    Is the above level different from baseline mobility prior to current illness? Yes - Recommend PT order Yes - Recommend PT order Yes - Recommend PT order --            Barriers to discharge: Pending clinical improvement Disposition Plan: To be determined Status is: Inpatient  Objective: Blood pressure 122/84, pulse 100, temperature 98.2 F (36.8 C), temperature source Axillary, resp. rate 20, height '5\' 4"'$  (1.626 m), weight 56.7 kg, SpO2 100 %.  Examination:  Physical Exam Constitutional:      Appearance: She is toxic-appearing.     Comments: Chronically ill-appearing young adult woman laying in bed in no distress but unable to interact overall; nonverbal, not following commands  HENT:     Head: Normocephalic and atraumatic.     Mouth/Throat:     Mouth: Mucous membranes are dry.  Eyes:     Comments: Spontaneous movement of eyes but patient unable to track; no nystagmus appreciated  Cardiovascular:     Rate and Rhythm: Normal rate and regular rhythm.  Pulmonary:     Effort:  Pulmonary effort is normal.     Breath sounds: Normal breath sounds.  Abdominal:     General: Bowel sounds are normal. There is distension.     Palpations: Abdomen is soft.     Tenderness: There is generalized abdominal tenderness and tenderness in the right lower quadrant.  Musculoskeletal:        General: Swelling present. Normal range of motion.     Cervical back: Normal range of motion and neck supple.  Skin:    General: Skin is warm and dry.  Neurological:     Mental Status: She is disoriented.     Comments: Does not verbalize.  No spontaneous movements.  Does not follow commands.    Consultants:  GI  Procedures:    Data Reviewed: Results for orders placed or performed during the hospital encounter of 10/30/22 (from the past 24 hour(s))  Magnesium     Status: Abnormal   Collection Time: 11/07/22  5:09 AM  Result Value Ref Range   Magnesium 1.4 (L) 1.7 - 2.4 mg/dL  Comprehensive metabolic panel     Status: Abnormal   Collection Time: 11/07/22  5:09 AM  Result Value Ref Range   Sodium 137 135 - 145 mmol/L   Potassium 3.1 (L) 3.5 - 5.1 mmol/L  Chloride 109 98 - 111 mmol/L   CO2 21 (L) 22 - 32 mmol/L   Glucose, Bld 118 (H) 70 - 99 mg/dL   BUN 13 6 - 20 mg/dL   Creatinine, Ser 0.44 0.44 - 1.00 mg/dL   Calcium 9.0 8.9 - 10.3 mg/dL   Total Protein 5.9 (L) 6.5 - 8.1 g/dL   Albumin 2.3 (L) 3.5 - 5.0 g/dL   AST 86 (H) 15 - 41 U/L   ALT 24 0 - 44 U/L   Alkaline Phosphatase 96 38 - 126 U/L   Total Bilirubin 12.1 (H) 0.3 - 1.2 mg/dL   GFR, Estimated >60 >60 mL/min   Anion gap 7 5 - 15  Protime-INR     Status: Abnormal   Collection Time: 11/07/22  5:09 AM  Result Value Ref Range   Prothrombin Time 19.0 (H) 11.4 - 15.2 seconds   INR 1.6 (H) 0.8 - 1.2  Hemoglobin and hematocrit, blood     Status: Abnormal   Collection Time: 11/07/22  5:09 AM  Result Value Ref Range   Hemoglobin 8.7 (L) 12.0 - 15.0 g/dL   HCT 25.5 (L) 36.0 - 46.0 %    I have Reviewed nursing notes,  Vitals, and Lab results since pt's last encounter. Pertinent lab results : see above I have ordered labwork to follow up on.  I have reviewed the last note from staff over past 24 hours I have discussed pt's care plan and test results with nursing staff, CM/SW, and other staff as appropriate  Time spent: Greater than 50% of the 55 minute visit was spent in counseling/coordination of care for the patient as laid out in the A&P.   LOS: 8 days   Irwin Brakeman, MD [facility] 11/07/2022, 10:57 AM

## 2022-11-07 NOTE — Progress Notes (Signed)
Subjective: Patient much more alert today.  Conversational.  Notes mild left upper quadrant abdominal pain.  Eating some.  Wishes to advance diet.  Objective: Vital signs in last 24 hours: Temp:  [97 F (36.1 C)-98.7 F (37.1 C)] 98.2 F (36.8 C) (12/23 0606) Pulse Rate:  [81-100] 100 (12/23 0606) Resp:  [12-20] 20 (12/23 0606) BP: (122-143)/(84-94) 122/84 (12/23 0606) SpO2:  [100 %] 100 % (12/23 0606) Last BM Date : 11/07/22 General:   Alert and oriented, pleasant Head:  Normocephalic and atraumatic. Eyes:  No icterus, sclera clear. Conjuctiva pink.  Abdomen:  Bowel sounds present, soft, non-tender, non-distended. No HSM or hernias noted. No rebound or guarding. No masses appreciated  Msk:  Symmetrical without gross deformities. Normal posture. Extremities:  Without clubbing or edema. Neurologic:  Alert and  oriented x4;  grossly normal neurologically. Skin:  Warm and dry, intact without significant lesions.  Cervical Nodes:  No significant cervical adenopathy. Psych:  Alert and cooperative. Normal mood and affect.  Intake/Output from previous day: 12/22 0701 - 12/23 0700 In: 290 [P.O.:240; IV Piggyback:50] Out: -  Intake/Output this shift: No intake/output data recorded.  Lab Results: Recent Labs    11/05/22 0405 11/06/22 0642 11/07/22 0509  WBC 20.4* 27.5*  --   HGB 9.4* 9.5* 8.7*  HCT 28.3* 27.9* 25.5*  PLT 157 157  --    BMET Recent Labs    11/05/22 0405 11/06/22 0642 11/07/22 0509  NA 141 136 137  K 2.9* 3.3* 3.1*  CL 111 109 109  CO2 19* 20* 21*  GLUCOSE 157* 122* 118*  BUN 24* 17 13  CREATININE 0.82 0.66 0.44  CALCIUM 9.9 9.2 9.0   LFT Recent Labs    11/05/22 0405 11/06/22 0642 11/07/22 0509  PROT 6.6 6.6 5.9*  ALBUMIN 2.6* 2.6* 2.3*  AST 77* 81* 86*  ALT '21 24 24  '$ ALKPHOS 113 109 96  BILITOT 13.5* 12.3* 12.1*   PT/INR Recent Labs    11/06/22 0642 11/07/22 0509  LABPROT 19.1* 19.0*  INR 1.6* 1.6*   Hepatitis Panel No results for  input(s): "HEPBSAG", "HCVAB", "HEPAIGM", "HEPBIGM" in the last 72 hours.   Studies/Results: No results found.  Assessment: *Alcoholic hepatitis *Decompensated cirrhosis-alcohol, ?PBC *Hepatic encephalopathy-improved  Plan: Patient appears to be improving.  Continue on lactulose/Xifaxan.  Continue prednisolone 40 mg daily. [place] score due 12/27.  This can be arranged outpatient if patient is discharged prior.  Advance diet to soft today.  Close follow-up in the outpatient setting with GI.  EGD outpatient.  Absolute alcohol cessation.  Daily CMP and coags.  Elon Alas. Abbey Chatters, D.O. Gastroenterology and Hepatology [facility]   LOS: 8 days    11/07/2022, 1:45 PM

## 2022-11-07 NOTE — Progress Notes (Signed)
Pt slept through the night. Pt has reported feeling nauseous. She vomited twice last night, immediately after receiving scheduled medication. Her family reported this has happened the last few times she has taken oral medication. MD notified, IV Phenergan ordered and given. Pt A&O X 3. Sclera yellow in L and R eye. Family at bedside through the night.

## 2022-11-08 ENCOUNTER — Other Ambulatory Visit: Payer: Self-pay | Admitting: Internal Medicine

## 2022-11-08 ENCOUNTER — Telehealth: Payer: Self-pay | Admitting: Internal Medicine

## 2022-11-08 DIAGNOSIS — K7011 Alcoholic hepatitis with ascites: Secondary | ICD-10-CM | POA: Diagnosis not present

## 2022-11-08 DIAGNOSIS — K7031 Alcoholic cirrhosis of liver with ascites: Secondary | ICD-10-CM | POA: Diagnosis not present

## 2022-11-08 DIAGNOSIS — N179 Acute kidney failure, unspecified: Secondary | ICD-10-CM | POA: Diagnosis not present

## 2022-11-08 DIAGNOSIS — K7290 Hepatic failure, unspecified without coma: Secondary | ICD-10-CM | POA: Diagnosis not present

## 2022-11-08 DIAGNOSIS — F1010 Alcohol abuse, uncomplicated: Secondary | ICD-10-CM | POA: Diagnosis not present

## 2022-11-08 DIAGNOSIS — K7200 Acute and subacute hepatic failure without coma: Secondary | ICD-10-CM | POA: Diagnosis not present

## 2022-11-08 LAB — COMPREHENSIVE METABOLIC PANEL
ALT: 31 U/L (ref 0–44)
AST: 102 U/L — ABNORMAL HIGH (ref 15–41)
Albumin: 2.8 g/dL — ABNORMAL LOW (ref 3.5–5.0)
Alkaline Phosphatase: 117 U/L (ref 38–126)
Anion gap: 10 (ref 5–15)
BUN: 12 mg/dL (ref 6–20)
CO2: 20 mmol/L — ABNORMAL LOW (ref 22–32)
Calcium: 9.4 mg/dL (ref 8.9–10.3)
Chloride: 104 mmol/L (ref 98–111)
Creatinine, Ser: 0.55 mg/dL (ref 0.44–1.00)
GFR, Estimated: >60 mL/min (ref >60)
Glucose, Bld: 128 mg/dL — ABNORMAL HIGH (ref 70–99)
Potassium: 3.9 mmol/L (ref 3.5–5.1)
Sodium: 134 mmol/L — ABNORMAL LOW (ref 135–145)
Total Bilirubin: 13 mg/dL — ABNORMAL HIGH (ref 0.3–1.2)
Total Protein: 7.3 g/dL (ref 6.5–8.1)

## 2022-11-08 LAB — MAGNESIUM: Magnesium: 1.7 mg/dL (ref 1.7–2.4)

## 2022-11-08 LAB — PROTIME-INR
INR: 1.6 — ABNORMAL HIGH (ref 0.8–1.2)
Prothrombin Time: 18.6 seconds — ABNORMAL HIGH (ref 11.4–15.2)

## 2022-11-08 LAB — CBC
HCT: 27.5 % — ABNORMAL LOW (ref 36.0–46.0)
Hemoglobin: 9 g/dL — ABNORMAL LOW (ref 12.0–15.0)
MCH: 31.9 pg (ref 26.0–34.0)
MCHC: 32.7 g/dL (ref 30.0–36.0)
MCV: 97.5 fL (ref 80.0–100.0)
Platelets: 133 10*3/uL — ABNORMAL LOW (ref 150–400)
RBC: 2.82 MIL/uL — ABNORMAL LOW (ref 3.87–5.11)
RDW: 21.6 % — ABNORMAL HIGH (ref 11.5–15.5)
WBC: 22.1 10*3/uL — ABNORMAL HIGH (ref 4.0–10.5)
nRBC: 0.1 % (ref 0.0–0.2)

## 2022-11-08 MED ORDER — ADULT MULTIVITAMIN W/MINERALS CH
1.0000 | ORAL_TABLET | Freq: Every day | ORAL | Status: DC
  Administered 2022-11-08: 1 via ORAL
  Filled 2022-11-08: qty 1

## 2022-11-08 MED ORDER — FOLIC ACID 1 MG PO TABS: 1.0000 mg | ORAL_TABLET | Freq: Every day | ORAL | 1 refills | Status: DC

## 2022-11-08 MED ORDER — NICOTINE 14 MG/24HR TD PT24: 14.0000 mg | MEDICATED_PATCH | Freq: Every day | TRANSDERMAL | 0 refills | Status: DC

## 2022-11-08 MED ORDER — ADULT MULTIVITAMIN W/MINERALS CH: 1.0000 | ORAL_TABLET | Freq: Every day | ORAL | 1 refills | Status: DC

## 2022-11-08 MED ORDER — ONDANSETRON HCL 4 MG PO TABS: 4.0000 mg | ORAL_TABLET | Freq: Three times a day (TID) | ORAL | 0 refills | Status: DC | PRN

## 2022-11-08 MED ORDER — PROSOURCE PLUS PO LIQD
30.0000 mL | Freq: Two times a day (BID) | ORAL | Status: DC
  Administered 2022-11-08: 30 mL via ORAL
  Filled 2022-11-08: qty 30

## 2022-11-08 MED ORDER — PREDNISOLONE 5 MG PO TABS: 40.0000 mg | ORAL_TABLET | Freq: Every day | ORAL | 0 refills | Status: DC

## 2022-11-08 MED ORDER — THIAMINE HCL 100 MG PO TABS: 100.0000 mg | ORAL_TABLET | Freq: Every day | ORAL | 1 refills | Status: DC

## 2022-11-08 MED ORDER — RIFAXIMIN 550 MG PO TABS: 550.0000 mg | ORAL_TABLET | Freq: Two times a day (BID) | ORAL | 1 refills | Status: DC

## 2022-11-08 MED ORDER — ENSURE ENLIVE PO LIQD: 237.0000 mL | Freq: Three times a day (TID) | ORAL | 12 refills | Status: DC

## 2022-11-08 MED ORDER — OXYCODONE HCL 5 MG PO TABS: 5.0000 mg | ORAL_TABLET | Freq: Four times a day (QID) | ORAL | 0 refills | Status: DC | PRN

## 2022-11-08 MED ORDER — ENSURE ENLIVE PO LIQD
237.0000 mL | Freq: Three times a day (TID) | ORAL | Status: DC
  Administered 2022-11-08: 237 mL via ORAL

## 2022-11-08 MED ORDER — LACTULOSE 10 GM/15ML PO SOLN: 20.0000 g | Freq: Three times a day (TID) | ORAL | 2 refills | Status: DC

## 2022-11-08 MED ORDER — PANTOPRAZOLE SODIUM 40 MG PO TBEC: 40.0000 mg | DELAYED_RELEASE_TABLET | Freq: Two times a day (BID) | ORAL | 1 refills | Status: DC

## 2022-11-08 MED ORDER — METOPROLOL TARTRATE 25 MG PO TABS: 12.5000 mg | ORAL_TABLET | Freq: Two times a day (BID) | ORAL | 1 refills | Status: DC

## 2022-11-08 NOTE — Progress Notes (Signed)
Initial Nutrition Assessment RD working remotely.   DOCUMENTATION CODES:   Not applicable  INTERVENTION:  - increase Ensure Plus High Protein from BID to TID, each supplement provides 350 kcal and 20 grams of protein.  - ordered 30 ml Prosource Plus BID, each supplement provides 100 kcal and 15 grams protein.   - ordered 1 tablet multivitamin with minerals/day.  - weigh today.  - complete NFPE when feasible.   NUTRITION DIAGNOSIS:   Increased nutrient needs related to acute illness as evidenced by estimated needs.  GOAL:   Patient will meet greater than or equal to 90% of their needs  MONITOR:   PO intake, Supplement acceptance, Labs, Weight trends  REASON FOR ASSESSMENT:   Consult Assessment of nutrition requirement/status (family request for oral supplements)  ASSESSMENT:   41 yo female with medical history of alcohol use disorder with dependence, depression, anxiety, and alcoholic cirrhosis.  She presented with progressively worsening abdominal pain. Workup was notable for severe alcoholic hepatitis and decompensated liver failure. GI was consulted on admission.  Diet a combination of NPO, clear liquids, and full liquids since admission on 12/15 until advancement to Soft on 12/23 at 1346. Intakes have mainly been 0%, including on 12/23. Ensure was ordered BID on 12/23 and patient has accepted all bottles offered since that time.   Weight on 12/15 was 125 lb and weight stable x1 year. She has not been weighed since admission on 12/15. Non-pitting edema to BLE documented in the flow sheet.  She has not been assessed by a Coralie Keens RD at any time in the past.  Per notes: - decompensated acute liver failure--not a transplant candidate due to active alcohol abuse (last drink: 5 days PTA) - alcoholic hepatitis - acute hepatic encephalopathy--resolving - AKI--resolved - SIRS - EGD likely--will be outpatient per GI note 12/23  Labs reviewed; Na: 134  mmol/l. Medications reviewed; 20 g lactulose TID, 40 mg IV protonix BID, 10 mEq IV KCl x5 runs 12/23, 40 mg prednisolone/day.     NUTRITION - FOCUSED PHYSICAL EXAM:  RD working remotely.  Diet Order:   Diet Order             DIET SOFT Room service appropriate? Yes; Fluid consistency: Thin  Diet effective now                   EDUCATION NEEDS:   No education needs have been identified at this time  Skin:  Skin Assessment: Reviewed RN Assessment  Last BM:  12/23 (type 6 x1, medium amount)  Height:   Ht Readings from Last 1 Encounters:  10/30/22 '5\' 4"'$  (1.626 m)    Weight:   Wt Readings from Last 1 Encounters:  10/30/22 56.7 kg     BMI:  Body mass index is 21.46 kg/m.  Estimated Nutritional Needs:  Kcal:  1800-2000 kcal Protein:  90-105 grams Fluid:  >/= 1.5 L/day     Jarome Matin, MS, RD, LDN, CNSC Clinical Dietitian PRN/Relief staff On-call/weekend pager # available in [facility]
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
## 2022-11-08 NOTE — Telephone Encounter (Signed)
Patient needs hospital follow-up visit with an app or myself in 3 to 4 weeks.  Thank you

## 2022-11-08 NOTE — Discharge Summary (Signed)
Physician Discharge Summary  Mallory Mendoza CBJ:[id number] DOB: 1981/07/12 DOA: 10/30/2022  PCP: Carrolyn Meiers, MD GI: [facility]   Admit date: 10/30/2022 Discharge date: 11/08/2022  Admitted From:  Home  Disposition:  Home with Home health  Recommendations for Outpatient Follow-up:  Follow up with [facility] in 1-2 weeks Go to [facility] on 11/11/22 to get labs ordered by Dr. Abbey Chatters Outpatient PT   Home Health:  outpatient PT / [facility]   Discharge Condition: Stable but Guarded   CODE STATUS: Full DIET: soft foods recommended    Brief Hospitalization Summary: Please see all hospital notes, images, labs for full details of the hospitalization. ADMISSION HPI:  41 yo female with PMH alcohol use disorder with dependence, depression/anxiety, alcoholic cirrhosis.  She presented with progressively worsening abdominal pain.  Workup was notable for severe alcoholic hepatitis and decompensated liver failure.  GI was consulted on admission.  Infectious workup was commenced and after ruled out, she was started on steroids.   Hospital Course by Problem   Decompensated acute liver failure - MELD-Na 36 on admission  - after underlying infection ruled out, she was started on prednisolone on 11/04/2022; please inform GI or primary service if patient refuses tablet or unable to take in order to change to IV route -Not a transplant candidate due to active etoh abuse on admission  - despite improvement in MELD-Na since admission, prognosis remains poor and she remains encephalopathic; has been taking lacutlose and was started on xifaximin as well - trend CMP, NH3, and PT/INR daily - palliative care consulted for assistance with [place] discussions; we have communicated to daughter and family that her prognosis is poor or guarded at best unless mentation improves more too - palliative team to reassess after 7 days of prednisolone therapy - 11/08/22: pt has improved with her mentation  over last 2 days;  Dr Abbey Chatters said she could discharge home today.  He made arrangements for her to get labs done on 12/27 to follow up and has made GI follow up arrangements.  She feels well to go home.  Dr. Abbey Chatters said to continue prednisolone 40 mg daily and continue xifaxan/lactulose.  She will follow up with [facility] for further management.     Alcoholic hepatitis - elevated DF on admission - Prednisolone started per GI; Pt took first dose on 11/04/2022; she is tolerating well.  DC home on prednisolone 40 mg daily.    Acute hepatic encephalopathy - Patient became progressively lethargic and obtunded - Ammonia was 78 on admission and uptrended to 135 (peak) and now seeing some downtrend; lactulose has been initiated; if unable to tolerated PO would pursue enema or NG administration - BUN also noted 63 on admission and down to 24 now, but no improvement in mentation, thus suspect this is moreso due to liver failure (mentation may be lagging the improvement in ammonia but it is still rather elevated); infection has also been ruled out as well -Discontinued dilaudid in case contributing to lethargy/mentation. -pt's mentation has returned to normal; DC home today; close outpatient follow up.  Continue lactulose TID.      AKI - treated and resolved  - baseline creatinine ~ 1 - patient presents with increase in creat >0.3 mg/dL above baseline, creat increase >1.5x baseline presumed to have occurred within past 7 days PTA - creat 8.87 on admission - differential includes prerenal vs HRS vs combo - appears to have responded to IVF, and now normalized  - continue  trending renal function  -Mentation improved to baseline.    SIRS - resolved -Leukocytosis noted on admission.  UA not considered consistent with UTI nor any growth on urine culture.  Blood cultures also negative since admission - Rocephin had initially been started and was discontinued - Etiology considered due to  underlying alcoholic hepatitis -Only trace ascites noted on abdominal ultrasound insufficient for paracentesis   Nausea/vomiting - probably from component of EtOH withdrawal - GI planning on possible eventual EGD when patient stabilizes/improves on outpatient basis    Hypovolemic hyponatremia Resolved with fluid resuscitation.   Acute normocytic anemia -Has been on Protonix and octreotide (latter now d/c'd) - Also noted to have prior macrocytosis likely in setting of alcohol use - Suspect there is a component of bone marrow suppression (esp with thrombocytopenia noted) from her chronic alcohol use   Hypokalemia -this has been repleted     Hypomagnesemia - IV magnesium given and repleted    Hypoalbuminemia: -Treated briefly with IV albumin   Alcohol abuse Last drink was 5 days prior to admission, continue thiamine and folate Monitor for acute alcohol withdrawal symptoms   Old records reviewed in assessment of this patient   Antimicrobials: Rocephin 10/31/2022 >> 11/02/2022    Discharge Diagnoses:  Principal Problem:   Acute liver failure without hepatic coma Active Problems:   Alcohol abuse   Alcoholic cirrhosis of liver with ascites (HCC)   Anemia   Hyponatremia   SIRS (systemic inflammatory response syndrome) (HCC)   AKI (acute kidney injury) ([place])   Decompensation of cirrhosis of liver (HCC)   Alcoholic hepatitis with ascites   Hyperbilirubinemia   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   folic acid 1 MG tablet Commonly known as: FOLVITE Take 1 tablet (1 mg total) by mouth daily.   lactulose 10 GM/15ML solution Commonly known as: CHRONULAC Take 30 mLs (20 g total) by mouth 3 (three) times daily.   metoprolol tartrate 25 MG tablet Commonly known as: LOPRESSOR Take 0.5 tablets (12.5 mg total) by mouth 2 (two) times daily.   multivitamin with minerals Tabs tablet Take 1 tablet by mouth daily.   nicotine 14 mg/24hr patch Commonly known as: NICODERM CQ - dosed in mg/24 hours Place 1 patch (14 mg total) onto the skin daily. Start taking on: November 09, 2022   ondansetron 4 MG tablet Commonly known as: Zofran Take 1 tablet (4 mg total) by mouth every 8 (eight) hours as needed for nausea or vomiting.   oxyCODONE 5 MG immediate release tablet Commonly known as: Roxicodone Take 1 tablet (5 mg total) by mouth every 6 (six) hours as needed for up to 3 days for breakthrough pain. What changed: reasons to take this   pantoprazole 40 MG tablet Commonly known as: Protonix Take 1 tablet (40 mg total) by mouth 2 (two) times daily.   prednisoLONE 5 MG Tabs tablet Take 8  tablets (40 mg total) by mouth daily. Start taking on: November 09, 2022   rifaximin 550 MG Tabs tablet Commonly known as: XIFAXAN Take 1 tablet (550 mg total) by mouth 2 (two) times daily.   thiamine 100 MG tablet Commonly known as: VITAMIN B1 Take 1 tablet (100 mg total) by mouth daily.               Durable Medical Equipment  (From admission, onward)           Start     Ordered   11/08/22 1302  For home use only DME Walker rolling  Once       Question Answer Comment  Walker: With 5 Inch Wheels   Patient needs a walker to treat with the following condition Gait instability   Patient needs a walker to treat with the following condition At risk for falling      11/08/22 1301            Follow-up Information     [facility]. Schedule an appointment as soon as possible for a visit in 1 week(s).   Why: Hospital Follow Up Contact information: [address] [place] [phone]        Carrolyn Meiers, MD. Schedule an appointment as soon as possible for a visit.   Specialty: Internal Medicine Why: Hospital Follow Up Contact information: [place] [address] [phone]                No Known Allergies Allergies as of 11/08/2022   No Known Allergies      Medication List     TAKE these medications    feeding supplement Liqd Take 237 mLs by mouth 3 (three) times daily between meals.   folic acid 1 MG tablet Commonly known as: FOLVITE Take 1 tablet (1 mg total) by mouth daily.   lactulose 10 GM/15ML solution Commonly known as: CHRONULAC Take 30 mLs (20 g total) by mouth 3 (three) times daily.   metoprolol tartrate 25 MG tablet Commonly known as: LOPRESSOR Take 0.5 tablets (12.5 mg total) by mouth 2 (two) times daily.   multivitamin with minerals Tabs tablet Take 1 tablet by mouth daily.   nicotine 14 mg/24hr patch Commonly known as: NICODERM CQ - dosed in mg/24 hours Place 1 patch (14 mg total) onto the skin daily. Start taking on: November 09, 2022   ondansetron 4 MG tablet Commonly known as: Zofran Take 1 tablet (4 mg total) by mouth every 8 (eight) hours as needed for nausea or vomiting.   oxyCODONE 5 MG immediate release tablet Commonly known as: Roxicodone Take 1 tablet (5 mg total) by mouth every 6 (six) hours as needed for up to 3 days for breakthrough pain. What changed: reasons to take this   pantoprazole 40 MG tablet Commonly known as: Protonix Take 1 tablet (40 mg total) by mouth 2 (two) times daily.   prednisoLONE 5 MG Tabs tablet Take 8 tablets (40 mg total) by mouth daily. Start taking on: November 09, 2022   rifaximin 550 MG Tabs tablet Commonly known as: XIFAXAN Take 1 tablet  (550 mg total) by mouth 2 (two) times daily.   thiamine 100 MG tablet Commonly known as: VITAMIN B1 Take 1 tablet (100 mg total) by mouth daily.               Durable Medical Equipment  (From admission, onward)  Start     Ordered   11/08/22 1302  For home use only DME Walker rolling  Once       Question Answer Comment  Walker: With 5 Inch Wheels   Patient needs a walker to treat with the following condition Gait instability   Patient needs a walker to treat with the following condition At risk for falling      11/08/22 1301            Procedures/Studies: [place] ASCITES (ABDOMEN LIMITED)  Result Date: 11/02/2022 CLINICAL DATA:  Ascites EXAM: LIMITED ABDOMEN ULTRASOUND FOR ASCITES TECHNIQUE: Limited ultrasound survey for ascites was performed in all four abdominal quadrants. COMPARISON:  CT abdomen and pelvis as well as ultrasound abdomen exam 10/30/2022 FINDINGS: Trace ascites present. Volume of ascites is insufficient for paracentesis. IMPRESSION: Trace ascites, insufficient for paracentesis. Electronically Signed   By: Lavonia Dana M.D.   On: 11/02/2022 13:53   DG CHEST PORT 1 VIEW  Result Date: 10/30/2022 CLINICAL DATA:  Shortness of breath, abdominal pain, nausea, vomiting, history cirrhosis EXAM: PORTABLE CHEST 1 VIEW COMPARISON:  Portable exam 1605 hours compared to 11/13/2021 FINDINGS: Normal heart size, mediastinal contours, and pulmonary vascularity. Minimal RIGHT basilar atelectasis. Lungs otherwise clear. No pulmonary infiltrate, pleural effusion, or pneumothorax. Osseous structures unremarkable. IMPRESSION: Minimal RIGHT basilar atelectasis. Electronically Signed   By: Lavonia Dana M.D.   On: 10/30/2022 16:21   US Abdomen Limited RUQ (LIVER/GB)  Result Date: 10/30/2022 CLINICAL DATA:  RIGHT upper quadrant abdominal pain, history cirrhosis EXAM: ULTRASOUND ABDOMEN LIMITED RIGHT UPPER QUADRANT COMPARISON:  CT abdomen and pelvis 10/30/2022 FINDINGS:  Gallbladder: Contracted, with thickened wall, nonspecific finding in the setting of ascites. No shadowing calculi or sonographic Murphy sign. Common bile duct: Diameter: 3 mm, normal Liver: Heterogeneous increased echogenicity of liver consistent with fatty infiltration seen on prior CT. No hepatic mass or nodularity. No intrahepatic biliary dilatation. Portal vein is patent on color Doppler imaging with normal direction of blood flow towards the liver. Other: Scattered ascites. IMPRESSION: Ascites. Thickened gallbladder wall, a nonspecific finding in the setting of ascites. Fatty infiltration of liver. Electronically Signed   By: Lavonia Dana M.D.   On: 10/30/2022 16:03   CT ABDOMEN PELVIS WO CONTRAST  Result Date: 10/30/2022 CLINICAL DATA:  Chronic abdominal pain with nausea and vomiting. History of cirrhosis. EXAM: CT ABDOMEN AND PELVIS WITHOUT CONTRAST TECHNIQUE: Multidetector CT imaging of the abdomen and pelvis was performed following the standard protocol without IV contrast. RADIATION DOSE REDUCTION: This exam was performed according to the departmental dose-optimization program which includes automated exposure control, adjustment of the mA and/or kV according to patient size and/or use of iterative reconstruction technique. COMPARISON:  CT November 13, 2021. FINDINGS: Lower chest: Motion degraded examination of the lungs reveals no acute abnormality. Hepatobiliary: Heterogeneous appearance of the hepatic parenchyma with interposed geographic areas of hepatic parenchymal hypodensity and hyperdensity. Hyperdense material in a nondilated gallbladder likely reflects sludge and stones. Gallbladder wall thickening with trace pericholecystic fluid is favored reactive. No biliary ductal dilation. Pancreas: No pancreatic ductal dilation or evidence of acute inflammation. Spleen: No splenomegaly. Adrenals/Urinary Tract: Bilateral adrenal glands are within normal limits. No hydronephrosis. No renal, ureteral or  bladder calculi. Stomach/Bowel: Stomach is unremarkable for degree of distension. No pathologic dilation of small or large bowel. Wall thickening of the ascending colon through the hepatic flexure favored portal colopathy. Vascular/Lymphatic: Aortic atherosclerosis. Smooth IVC contours. No pathologically enlarged abdominal or pelvic lymph nodes. Reproductive: Uterus and bilateral  adnexa are unremarkable. Other: Small volume of abdominopelvic ascites. Diffuse body wall edema. Musculoskeletal: No acute osseous abnormality. IMPRESSION: 1. Heterogeneous appearance of the hepatic parenchyma with interposed geographic areas of hepatic parenchymal hypodensity and hyperdensity. Findings are favored to reflect hepatic steatosis with areas of focal fatty sparing. However, consider further evaluation with nonemergent hepatic protocol MRI with and without contrast for more definitive characterization. 2. Hyperdense material in a nondilated gallbladder likely reflects sludge and stones. Gallbladder wall thickening with trace pericholecystic fluid is favored reactive. Consider further evaluation with right upper quadrant ultrasound. 3. Wall thickening of the ascending colon through the hepatic flexure favored to reflect portal colopathy. 4. Small volume of abdominopelvic ascites compatible with portal hypertension. 5.  Aortic Atherosclerosis (ICD10-I70.0). Electronically Signed   By: Dahlia Bailiff M.D.   On: 10/30/2022 14:52     Subjective: Pt is awake, alert, eating and drinking and ambulating in room with assistance;   Discharge Exam: Vitals:   11/08/22 0911 11/08/22 1109  BP: (!) 107/94 122/82  Pulse: 95 92  Resp:  20  Temp:  98.7 F (37.1 C)  SpO2:  100%   Vitals:   11/07/22 2223 11/08/22 0911 11/08/22 1109 11/08/22 1250  BP: 126/82 (!) 107/94 122/82   Pulse: 92 95 92   Resp: 20  20   Temp: 98.5 F (36.9 C)  98.7 F (37.1 C)   TempSrc: Oral  Oral   SpO2: 99%  100%   Weight:    65.7 kg  Height:        General: Pt is alert, awake, not in acute distress, appears puffy, scleral icterus bilateral, jaundiced diffusely.  Cardiovascular: RRR, S1/S2 +, no rubs, no gallops Respiratory: CTA bilaterally, no wheezing, no rhonchi Abdominal: Soft, NT, ND, bowel sounds + Extremities: 1+ edema, no cyanosis   The results of significant diagnostics from this hospitalization (including imaging, microbiology, ancillary and laboratory) are listed below for reference.     Microbiology: Recent Results (from the past 240 hour(s))  Urine Culture     Status: None   Collection Time: 10/30/22  7:38 PM   Specimen: Urine, Clean Catch  Result Value Ref Range Status   Specimen Description   Final    URINE, CLEAN CATCH Performed at [facility], [address] [place], [place] [address]    Special Requests   Final    NONE Performed at [facility], [address] [place], [place] [address]    Culture   Final    NO GROWTH Performed at [facility], [place] [address], [place], [place] [address]    Report Status 11/01/2022 FINAL  Final  Culture, blood (Routine X 2) w Reflex to ID Panel     Status: None   Collection Time: 10/31/22 10:04 AM   Specimen: BLOOD RIGHT ARM  Result Value Ref Range Status   Specimen Description   Final    BLOOD RIGHT ARM BOTTLES DRAWN AEROBIC AND ANAEROBIC   Special Requests Blood Culture adequate volume  Final   Culture   Final    NO GROWTH 5 DAYS Performed at [facility], [address], [place], [place] [address]    Report Status 11/05/2022 FINAL  Final  Culture, blood (Routine X 2) w Reflex to ID Panel     Status: None   Collection Time: 10/31/22 10:04 AM   Specimen: BLOOD RIGHT HAND  Result Value Ref Range Status   Specimen Description   Final    BLOOD RIGHT HAND BOTTLES  DRAWN AEROBIC AND ANAEROBIC   Special Requests Blood Culture adequate volume  Final   Culture   Final    NO GROWTH 5 DAYS Performed at [facility], [address] [place], [place] [address]     Report Status 11/05/2022 FINAL  Final    Labs: BNP (last 3 results) No results for input(s): "BNP" in the last 8760 hours. Basic Metabolic Panel: Recent Labs  Lab 11/02/22 0631 11/03/22 0528 11/04/22 0432 11/05/22 0405 11/06/22 0642 11/07/22 0509 11/08/22 0454  NA  --    < > 141 141 136 137 134*  K  --    < > 3.1* 2.9* 3.3* 3.1* 3.9  CL  --    < > 113* 111 109 109 104  CO2  --    < > 18* 19* 20* 21* 20*  GLUCOSE  --    < > 114* 157* 122* 118* 128*  BUN  --    < > 34* 24* '17 13 12  '$ CREATININE  --    < > 1.22* 0.82 0.66 0.44 0.55  CALCIUM  --    < > 9.7 9.9 9.2 9.0 9.4  MG 1.8  --   --  1.1* 1.4* 1.4* 1.7   < > = values in this interval not displayed.   Liver Function Tests: Recent Labs  Lab 11/04/22 0432 11/05/22 0405 11/06/22 0642 11/07/22 0509 11/08/22 0454  AST 70* 77* 81* 86* 102*  ALT '20 21 24 24 31  '$ ALKPHOS 118 113 109 96 117  BILITOT 14.5* 13.5* 12.3* 12.1* 13.0*  PROT 6.5 6.6 6.6 5.9* 7.3  ALBUMIN 2.7* 2.6* 2.6* 2.3* 2.8*   No results for input(s): "LIPASE", "AMYLASE" in the last 168 hours. Recent Labs  Lab 11/02/22 1350 11/03/22 0528 11/05/22 0826 11/06/22 0642  AMMONIA 134* 135* 103* 98*   CBC: Recent Labs  Lab 11/03/22 0526 11/04/22 0432 11/05/22 0405 11/06/22 0642 11/07/22 0509 11/08/22 0454  WBC 15.7* 18.0* 20.4* 27.5*  --  22.1*  NEUTROABS 12.3* 13.9* 16.5* 22.3*  --   --   HGB 10.1* 9.6* 9.4* 9.5* 8.7* 9.0*  HCT 29.9* 28.3* 28.3* 27.9* 25.5* 27.5*  MCV 97.1 96.6 96.9 95.9  --  97.5  PLT 153 152 157 157  --  133*   Cardiac Enzymes: No results for input(s): "CKTOTAL", "CKMB", "CKMBINDEX", "TROPONINI" in the last 168 hours. BNP: Invalid input(s): "POCBNP" CBG: No results for input(s): "GLUCAP" in the last 168 hours. D-Dimer No results for input(s): "DDIMER" in the last 72 hours. Hgb A1c No results for input(s): "HGBA1C" in the last 72 hours. Lipid Profile No results for input(s): "CHOL", "HDL", "LDLCALC", "TRIG", "CHOLHDL",  "LDLDIRECT" in the last 72 hours. Thyroid function studies No results for input(s): "TSH", "T4TOTAL", "T3FREE", "THYROIDAB" in the last 72 hours.  Invalid input(s): "FREET3" Anemia work up No results for input(s): "VITAMINB12", "FOLATE", "FERRITIN", "TIBC", "IRON", "RETICCTPCT" in the last 72 hours. Urinalysis    Component Value Date/Time   COLORURINE AMBER (A) 10/30/2022 1953   APPEARANCEUR CLOUDY (A) 10/30/2022 1953   LABSPEC 1.002 (L) 10/30/2022 1953   PHURINE 5.0 10/30/2022 1953   GLUCOSEU 50 (A) 10/30/2022 1953   HGBUR LARGE (A) 10/30/2022 1953   BILIRUBINUR NEGATIVE 10/30/2022 1953   KETONESUR NEGATIVE 10/30/2022 1953   PROTEINUR 30 (A) 10/30/2022 1953   UROBILINOGEN 1.0 06/11/2014 0935   NITRITE NEGATIVE 10/30/2022 1953   LEUKOCYTESUR TRACE (A) 10/30/2022 1953   Sepsis Labs Recent Labs  Lab 11/04/22 0432 11/05/22  0405 11/06/22 0642 11/08/22 0454  WBC 18.0* 20.4* 27.5* 22.1*   Microbiology Recent Results (from the past 240 hour(s))  Urine Culture     Status: None   Collection Time: 10/30/22  7:38 PM   Specimen: Urine, Clean Catch  Result Value Ref Range Status   Specimen Description   Final    URINE, CLEAN CATCH Performed at [facility] [address], [place], [place] [address]    Special Requests   Final    NONE Performed at [facility], [address], [place], [place] [address]    Culture   Final    NO GROWTH Performed at [facility], [place] [address] [place], [place] [address]    Report Status 11/01/2022 FINAL  Final  Culture, blood (Routine X 2) w Reflex to ID Panel     Status: None   Collection Time: 10/31/22 10:04 AM   Specimen: BLOOD RIGHT ARM  Result Value Ref Range Status   Specimen Description   Final    BLOOD RIGHT ARM BOTTLES DRAWN AEROBIC AND ANAEROBIC   Special Requests Blood Culture adequate volume  Final   Culture   Final    NO GROWTH 5 DAYS Performed at [facility], [address], [place], [place] [address]    Report  Status 11/05/2022 FINAL  Final  Culture, blood (Routine X 2) w Reflex to ID Panel     Status: None   Collection Time: 10/31/22 10:04 AM   Specimen: BLOOD RIGHT HAND  Result Value Ref Range Status   Specimen Description   Final    BLOOD RIGHT HAND BOTTLES DRAWN AEROBIC AND ANAEROBIC   Special Requests Blood Culture adequate volume  Final   Culture   Final    NO GROWTH 5 DAYS Performed at [facility], [address] [place], [place] [address]    Report Status 11/05/2022 FINAL  Final   Time coordinating discharge: 55 mins  SIGNED:  Irwin Brakeman, MD  [facility] 11/08/2022, 1:12 PM How to contact the [facility] Attending or Consulting provider [place] or covering provider during after hours [place], for this patient?  Check the care team in [facility] and look for a) attending/consulting [facility] provider listed and b) the [facility] team listed Log into [email or web address] and use [place]'s universal password to access. If you do not have the password, please contact the hospital operator. Locate the [facility] provider you are looking for under [facility] and page to a number that you can be directly reached. If you still have difficulty reaching the provider, please page the [facility] (Director on Call) for the Hospitalists listed on amion for assistance.

## 2022-11-08 NOTE — Discharge Instructions (Addendum)
PLEASE HAVE LABS CHECKED ON 12/27  PLEASE FOLLOW UP WITH GI CLINIC AS SCHEDULED PLEASE AVOID ALL ALCOHOL CONSUMPTION CONTINUE SOFT FOODS DIET    IMPORTANT INFORMATION: PAY CLOSE ATTENTION   PHYSICIAN DISCHARGE INSTRUCTIONS  Follow with Primary care provider  Carrolyn Meiers, MD  and other consultants as instructed by your Hospitalist Physician  [place] IF SYMPTOMS COME BACK, WORSEN OR NEW PROBLEM DEVELOPS   Please note: You were cared for by a hospitalist during your hospital stay. Every effort will be made to forward records to your primary care provider.  You can request that your primary care provider send for your hospital records if they have not received them.  Once you are discharged, your primary care physician will handle any further medical issues. Please note that NO REFILLS for any discharge medications will be authorized once you are discharged, as it is imperative that you return to your primary care physician (or establish a relationship with a primary care physician if you do not have one) for your post hospital discharge needs so that they can reassess your need for medications and monitor your lab values.  Please get a complete blood count and chemistry panel checked by your Primary MD at your next visit, and again as instructed by your Primary MD.  Get Medicines reviewed and adjusted: Please take all your medications with you for your next visit with your Primary MD  Laboratory/radiological data: Please request your Primary MD to go over all hospital tests and procedure/radiological results at the follow up, please ask your primary care provider to get all Hospital records sent to his/her office.  In some cases, they will be blood work, cultures and biopsy results pending at the time of your discharge. Please request that your primary care provider follow up on these results.  If you are diabetic, please bring your blood  sugar readings with you to your follow up appointment with primary care.    Please call and make your follow up appointments as soon as possible.    Also Note the following: If you experience worsening of your admission symptoms, develop shortness of breath, life threatening emergency, suicidal or homicidal thoughts you must seek medical attention immediately by calling 911 or calling your MD immediately  if symptoms less severe.  You must read complete instructions/literature along with all the possible adverse reactions/side effects for all the Medicines you take and that have been prescribed to you. Take any new Medicines after you have completely understood and accpet all the possible adverse reactions/side effects.   Do not drive when taking Pain medications or sleeping medications (Benzodiazepines)  Do not take more than prescribed Pain, Sleep and Anxiety Medications. It is not advisable to combine anxiety,sleep and pain medications without talking with your primary care practitioner  Special Instructions: If you have smoked or chewed Tobacco  in the last 2 yrs please stop smoking, stop any regular Alcohol  and or any Recreational drug use.  Wear Seat belts while driving.  Do not drive if taking any narcotic, mind altering or controlled substances or recreational drugs or alcohol.

## 2022-11-10 ENCOUNTER — Other Ambulatory Visit: Payer: Self-pay | Admitting: Family Medicine

## 2022-11-10 MED ORDER — PREDNISOLONE 5 MG PO TABS: 40.0000 mg | ORAL_TABLET | Freq: Every day | ORAL | 0 refills | Status: DC

## 2022-11-10 MED ORDER — PREDNISOLONE SODIUM PHOSPHATE 15 MG/5ML PO SOLN: 40.0000 mg | Freq: Every day | ORAL | 0 refills | Status: DC

## 2022-11-10 MED ORDER — OXYCODONE HCL 5 MG PO TABS: 5.0000 mg | ORAL_TABLET | Freq: Four times a day (QID) | ORAL | 0 refills | Status: AC | PRN

## 2022-11-10 NOTE — Progress Notes (Signed)
Patient called back to the hospital and reported that her initial pharmacy [facility] on [address] did not have supply of oxycodone or prednisolone in stock and she requested prescriptions be sent to [facility].  I have electronically sent prescriptions to [facility] on this date at this time.  *UPDATE: [facility] does not carry prednisolone.  RX called in to [facility] on [address] for liquid prednisolone the only supply we could find in the area.    Sheliah Plane How to contact the [facility] Attending or Consulting provider [place] or covering provider during after hours [place], for this patient?  Check the care team in [facility] and look for a) attending/consulting [facility] provider listed and b) the [facility] team listed Log into [email or web address] and use [place]'s universal password to access. If you do not have the password, please contact the hospital operator. Locate the [facility] provider you are looking for under [facility] and page to a number that you can be directly reached. If you still have difficulty reaching the provider, please page the [facility] (Director on Call) for the Hospitalists listed on amion for assistance.

## 2022-11-25 DIAGNOSIS — R600 Localized edema: Secondary | ICD-10-CM | POA: Diagnosis not present

## 2022-11-25 DIAGNOSIS — R2681 Unsteadiness on feet: Secondary | ICD-10-CM | POA: Diagnosis not present

## 2022-11-25 DIAGNOSIS — F411 Generalized anxiety disorder: Secondary | ICD-10-CM | POA: Diagnosis not present

## 2022-11-26 DIAGNOSIS — G8190 Hemiplegia, unspecified affecting unspecified side: Secondary | ICD-10-CM | POA: Diagnosis not present
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
## 2022-12-03 ENCOUNTER — Ambulatory Visit (HOSPITAL_COMMUNITY): Payer: Medicaid Other | Attending: Family Medicine

## 2022-12-03 ENCOUNTER — Other Ambulatory Visit: Payer: Self-pay

## 2022-12-03 DIAGNOSIS — R262 Difficulty in walking, not elsewhere classified: Secondary | ICD-10-CM | POA: Insufficient documentation

## 2022-12-03 DIAGNOSIS — M6281 Muscle weakness (generalized): Secondary | ICD-10-CM | POA: Insufficient documentation

## 2022-12-03 DIAGNOSIS — N179 Acute kidney failure, unspecified: Secondary | ICD-10-CM | POA: Insufficient documentation

## 2022-12-03 DIAGNOSIS — R29898 Other symptoms and signs involving the musculoskeletal system: Secondary | ICD-10-CM | POA: Diagnosis not present

## 2022-12-04 ENCOUNTER — Encounter (HOSPITAL_COMMUNITY): Payer: Self-pay

## 2022-12-04 ENCOUNTER — Inpatient Hospital Stay (HOSPITAL_COMMUNITY)
Admission: EM | Admit: 2022-12-04 | Discharge: 2022-12-07 | DRG: 432 | Disposition: A | Discharge status: Home / Self Care | Payer: Medicaid Other | Attending: Internal Medicine | Admitting: Internal Medicine

## 2022-12-04 ENCOUNTER — Other Ambulatory Visit: Payer: Self-pay

## 2022-12-04 DIAGNOSIS — M7989 Other specified soft tissue disorders: Secondary | ICD-10-CM | POA: Diagnosis present

## 2022-12-04 DIAGNOSIS — E8809 Other disorders of plasma-protein metabolism, not elsewhere classified: Secondary | ICD-10-CM | POA: Diagnosis present

## 2022-12-04 DIAGNOSIS — F17210 Nicotine dependence, cigarettes, uncomplicated: Secondary | ICD-10-CM | POA: Diagnosis present

## 2022-12-04 DIAGNOSIS — F32A Depression, unspecified: Secondary | ICD-10-CM | POA: Diagnosis present

## 2022-12-04 DIAGNOSIS — J189 Pneumonia, unspecified organism: Secondary | ICD-10-CM | POA: Diagnosis present

## 2022-12-04 DIAGNOSIS — Z91148 Patient's other noncompliance with medication regimen for other reason: Secondary | ICD-10-CM

## 2022-12-04 DIAGNOSIS — Z8249 Family history of ischemic heart disease and other diseases of the circulatory system: Secondary | ICD-10-CM

## 2022-12-04 DIAGNOSIS — E876 Hypokalemia: Secondary | ICD-10-CM | POA: Diagnosis present

## 2022-12-04 DIAGNOSIS — Z79899 Other long term (current) drug therapy: Secondary | ICD-10-CM

## 2022-12-04 DIAGNOSIS — R601 Generalized edema: Principal | ICD-10-CM

## 2022-12-04 DIAGNOSIS — R262 Difficulty in walking, not elsewhere classified: Secondary | ICD-10-CM | POA: Diagnosis present

## 2022-12-04 DIAGNOSIS — K219 Gastro-esophageal reflux disease without esophagitis: Secondary | ICD-10-CM | POA: Insufficient documentation

## 2022-12-04 DIAGNOSIS — F419 Anxiety disorder, unspecified: Secondary | ICD-10-CM | POA: Diagnosis present

## 2022-12-04 DIAGNOSIS — K7030 Alcoholic cirrhosis of liver without ascites: Secondary | ICD-10-CM | POA: Diagnosis present

## 2022-12-04 DIAGNOSIS — F1010 Alcohol abuse, uncomplicated: Secondary | ICD-10-CM | POA: Diagnosis present

## 2022-12-04 DIAGNOSIS — K7031 Alcoholic cirrhosis of liver with ascites: Secondary | ICD-10-CM | POA: Diagnosis present

## 2022-12-04 DIAGNOSIS — D72829 Elevated white blood cell count, unspecified: Secondary | ICD-10-CM | POA: Diagnosis present

## 2022-12-04 DIAGNOSIS — R079 Chest pain, unspecified: Secondary | ICD-10-CM | POA: Insufficient documentation

## 2022-12-04 DIAGNOSIS — K704 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: Secondary | ICD-10-CM | POA: Diagnosis present

## 2022-12-04 DIAGNOSIS — K7011 Alcoholic hepatitis with ascites: Secondary | ICD-10-CM | POA: Diagnosis present

## 2022-12-04 DIAGNOSIS — F17200 Nicotine dependence, unspecified, uncomplicated: Secondary | ICD-10-CM | POA: Diagnosis present

## 2022-12-04 DIAGNOSIS — I10 Essential (primary) hypertension: Secondary | ICD-10-CM | POA: Diagnosis not present

## 2022-12-04 LAB — COMPREHENSIVE METABOLIC PANEL
ALT: 37 U/L (ref 0–44)
AST: 80 U/L — ABNORMAL HIGH (ref 15–41)
Albumin: 2.5 g/dL — ABNORMAL LOW (ref 3.5–5.0)
Alkaline Phosphatase: 146 U/L — ABNORMAL HIGH (ref 38–126)
Anion gap: 12 (ref 5–15)
BUN: 8 mg/dL (ref 6–20)
CO2: 22 mmol/L (ref 22–32)
Calcium: 8.4 mg/dL — ABNORMAL LOW (ref 8.9–10.3)
Chloride: 101 mmol/L (ref 98–111)
Creatinine, Ser: 0.6 mg/dL (ref 0.44–1.00)
GFR, Estimated: >60 mL/min (ref >60)
Glucose, Bld: 171 mg/dL — ABNORMAL HIGH (ref 70–99)
Potassium: 2.6 mmol/L — CL (ref 3.5–5.1)
Sodium: 135 mmol/L (ref 135–145)
Total Bilirubin: 3.5 mg/dL — ABNORMAL HIGH (ref 0.3–1.2)
Total Protein: 6.8 g/dL (ref 6.5–8.1)

## 2022-12-04 LAB — CBC WITH DIFFERENTIAL/PLATELET
Abs Immature Granulocytes: 0.18 10*3/uL — ABNORMAL HIGH (ref 0.00–0.07)
Basophils Absolute: 0 10*3/uL (ref 0.0–0.1)
Basophils Relative: 0 %
Eosinophils Absolute: 0 10*3/uL (ref 0.0–0.5)
Eosinophils Relative: 0 %
HCT: 28.6 % — ABNORMAL LOW (ref 36.0–46.0)
Hemoglobin: 9.1 g/dL — ABNORMAL LOW (ref 12.0–15.0)
Immature Granulocytes: 1 %
Lymphocytes Relative: 6 %
Lymphs Abs: 1.1 10*3/uL (ref 0.7–4.0)
MCH: 32.6 pg (ref 26.0–34.0)
MCHC: 31.8 g/dL (ref 30.0–36.0)
MCV: 102.5 fL — ABNORMAL HIGH (ref 80.0–100.0)
Monocytes Absolute: 0.5 10*3/uL (ref 0.1–1.0)
Monocytes Relative: 3 %
Neutro Abs: 17.4 10*3/uL — ABNORMAL HIGH (ref 1.7–7.7)
Neutrophils Relative %: 90 %
Platelets: 256 10*3/uL (ref 150–400)
RBC: 2.79 MIL/uL — ABNORMAL LOW (ref 3.87–5.11)
RDW: 22.9 % — ABNORMAL HIGH (ref 11.5–15.5)
WBC: 19.2 10*3/uL — ABNORMAL HIGH (ref 4.0–10.5)
nRBC: 0 % (ref 0.0–0.2)

## 2022-12-04 LAB — MAGNESIUM: Magnesium: 1.7 mg/dL (ref 1.7–2.4)

## 2022-12-04 LAB — PROTIME-INR
INR: 1.5 — ABNORMAL HIGH (ref 0.8–1.2)
Prothrombin Time: 17.9 seconds — ABNORMAL HIGH (ref 11.4–15.2)

## 2022-12-04 LAB — AMMONIA: Ammonia: 22 umol/L (ref 9–35)

## 2022-12-04 MED ORDER — FUROSEMIDE 10 MG/ML IJ SOLN
60.0000 mg | Freq: Once | INTRAMUSCULAR | Status: AC
  Administered 2022-12-04: 60 mg via INTRAVENOUS
  Filled 2022-12-04: qty 6

## 2022-12-04 MED ORDER — POTASSIUM CHLORIDE CRYS ER 20 MEQ PO TBCR
40.0000 meq | EXTENDED_RELEASE_TABLET | Freq: Once | ORAL | Status: AC
  Administered 2022-12-04: 40 meq via ORAL
  Filled 2022-12-04: qty 2

## 2022-12-04 MED ORDER — POTASSIUM CHLORIDE 10 MEQ/100ML IV SOLN
10.0000 meq | INTRAVENOUS | Status: AC
  Administered 2022-12-04 – 2022-12-05 (×3): 10 meq via INTRAVENOUS
  Filled 2022-12-04 (×3): qty 100

## 2022-12-04 MED ORDER — ALBUMIN HUMAN 25 % IV SOLN
12.5000 g | Freq: Once | INTRAVENOUS | Status: AC
  Administered 2022-12-04: 12.5 g via INTRAVENOUS
  Filled 2022-12-04 (×2): qty 50

## 2022-12-04 NOTE — ED Triage Notes (Signed)
Pt reports she was discharged on christmas eve after admission for alcohol induced cirrhosis.  Pt  reports the swelling in her legs has never improved since coming home, has seen her PCP and was given fluid pills but it is not working.  Has appointment with GI on 2/15.

## 2022-12-04 NOTE — ED Notes (Signed)
Receiving RN Sharyn Lull has agreed to accept [facility] once pt has arrived to inpatient unit, all questions and concerns address.
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
## 2022-12-05 ENCOUNTER — Observation Stay (HOSPITAL_COMMUNITY): Payer: Medicaid Other

## 2022-12-05 DIAGNOSIS — E8809 Other disorders of plasma-protein metabolism, not elsewhere classified: Secondary | ICD-10-CM | POA: Diagnosis present

## 2022-12-05 DIAGNOSIS — R079 Chest pain, unspecified: Secondary | ICD-10-CM | POA: Diagnosis not present

## 2022-12-05 DIAGNOSIS — K219 Gastro-esophageal reflux disease without esophagitis: Secondary | ICD-10-CM | POA: Diagnosis not present

## 2022-12-05 DIAGNOSIS — K704 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: Secondary | ICD-10-CM | POA: Diagnosis present

## 2022-12-05 DIAGNOSIS — Z79899 Other long term (current) drug therapy: Secondary | ICD-10-CM | POA: Diagnosis not present

## 2022-12-05 DIAGNOSIS — R262 Difficulty in walking, not elsewhere classified: Secondary | ICD-10-CM | POA: Diagnosis present

## 2022-12-05 DIAGNOSIS — I10 Essential (primary) hypertension: Secondary | ICD-10-CM | POA: Diagnosis not present

## 2022-12-05 DIAGNOSIS — R601 Generalized edema: Secondary | ICD-10-CM | POA: Diagnosis not present

## 2022-12-05 DIAGNOSIS — F419 Anxiety disorder, unspecified: Secondary | ICD-10-CM | POA: Diagnosis present

## 2022-12-05 DIAGNOSIS — F32A Depression, unspecified: Secondary | ICD-10-CM | POA: Diagnosis present

## 2022-12-05 DIAGNOSIS — J189 Pneumonia, unspecified organism: Secondary | ICD-10-CM | POA: Diagnosis not present

## 2022-12-05 DIAGNOSIS — M7989 Other specified soft tissue disorders: Secondary | ICD-10-CM | POA: Diagnosis present

## 2022-12-05 DIAGNOSIS — K7031 Alcoholic cirrhosis of liver with ascites: Secondary | ICD-10-CM

## 2022-12-05 DIAGNOSIS — Z91148 Patient's other noncompliance with medication regimen for other reason: Secondary | ICD-10-CM | POA: Diagnosis not present

## 2022-12-05 DIAGNOSIS — E876 Hypokalemia: Secondary | ICD-10-CM

## 2022-12-05 DIAGNOSIS — J90 Pleural effusion, not elsewhere classified: Secondary | ICD-10-CM | POA: Diagnosis not present

## 2022-12-05 DIAGNOSIS — D72829 Elevated white blood cell count, unspecified: Secondary | ICD-10-CM | POA: Diagnosis present

## 2022-12-05 DIAGNOSIS — F17200 Nicotine dependence, unspecified, uncomplicated: Secondary | ICD-10-CM | POA: Diagnosis not present

## 2022-12-05 DIAGNOSIS — F1010 Alcohol abuse, uncomplicated: Secondary | ICD-10-CM | POA: Diagnosis present

## 2022-12-05 DIAGNOSIS — K7011 Alcoholic hepatitis with ascites: Secondary | ICD-10-CM | POA: Diagnosis present

## 2022-12-05 DIAGNOSIS — Z8249 Family history of ischemic heart disease and other diseases of the circulatory system: Secondary | ICD-10-CM | POA: Diagnosis not present

## 2022-12-05 DIAGNOSIS — F17210 Nicotine dependence, cigarettes, uncomplicated: Secondary | ICD-10-CM | POA: Diagnosis present

## 2022-12-05 LAB — COMPREHENSIVE METABOLIC PANEL
ALT: 31 U/L (ref 0–44)
AST: 52 U/L — ABNORMAL HIGH (ref 15–41)
Albumin: 2.4 g/dL — ABNORMAL LOW (ref 3.5–5.0)
Alkaline Phosphatase: 134 U/L — ABNORMAL HIGH (ref 38–126)
Anion gap: 10 (ref 5–15)
BUN: 8 mg/dL (ref 6–20)
CO2: 23 mmol/L (ref 22–32)
Calcium: 8.3 mg/dL — ABNORMAL LOW (ref 8.9–10.3)
Chloride: 103 mmol/L (ref 98–111)
Creatinine, Ser: 0.54 mg/dL (ref 0.44–1.00)
GFR, Estimated: >60 mL/min (ref >60)
Glucose, Bld: 91 mg/dL (ref 70–99)
Potassium: 3 mmol/L — ABNORMAL LOW (ref 3.5–5.1)
Sodium: 136 mmol/L (ref 135–145)
Total Bilirubin: 3 mg/dL — ABNORMAL HIGH (ref 0.3–1.2)
Total Protein: 6.2 g/dL — ABNORMAL LOW (ref 6.5–8.1)

## 2022-12-05 LAB — HIV ANTIBODY (ROUTINE TESTING W REFLEX): HIV Screen 4th Generation wRfx: NONREACTIVE

## 2022-12-05 LAB — CBC WITH DIFFERENTIAL/PLATELET
Abs Immature Granulocytes: 0.15 10*3/uL — ABNORMAL HIGH (ref 0.00–0.07)
Basophils Absolute: 0 10*3/uL (ref 0.0–0.1)
Basophils Relative: 0 %
Eosinophils Absolute: 0.1 10*3/uL (ref 0.0–0.5)
Eosinophils Relative: 0 %
HCT: 24.7 % — ABNORMAL LOW (ref 36.0–46.0)
Hemoglobin: 7.9 g/dL — ABNORMAL LOW (ref 12.0–15.0)
Immature Granulocytes: 1 %
Lymphocytes Relative: 9 %
Lymphs Abs: 1.7 10*3/uL (ref 0.7–4.0)
MCH: 32.5 pg (ref 26.0–34.0)
MCHC: 32 g/dL (ref 30.0–36.0)
MCV: 101.6 fL — ABNORMAL HIGH (ref 80.0–100.0)
Monocytes Absolute: 0.8 10*3/uL (ref 0.1–1.0)
Monocytes Relative: 4 %
Neutro Abs: 16.2 10*3/uL — ABNORMAL HIGH (ref 1.7–7.7)
Neutrophils Relative %: 86 %
Platelets: 243 10*3/uL (ref 150–400)
RBC: 2.43 MIL/uL — ABNORMAL LOW (ref 3.87–5.11)
RDW: 22.8 % — ABNORMAL HIGH (ref 11.5–15.5)
WBC: 19 10*3/uL — ABNORMAL HIGH (ref 4.0–10.5)
nRBC: 0 % (ref 0.0–0.2)

## 2022-12-05 LAB — TROPONIN I (HIGH SENSITIVITY)
Troponin I (High Sensitivity): 5 ng/L (ref <18)
Troponin I (High Sensitivity): 6 ng/L (ref <18)

## 2022-12-05 LAB — MAGNESIUM: Magnesium: 1.6 mg/dL — ABNORMAL LOW (ref 1.7–2.4)

## 2022-12-05 LAB — PROTIME-INR
INR: 1.4 — ABNORMAL HIGH (ref 0.8–1.2)
Prothrombin Time: 17 seconds — ABNORMAL HIGH (ref 11.4–15.2)

## 2022-12-05 MED ORDER — ONDANSETRON HCL 4 MG/2ML IJ SOLN: 4.0000 mg | Freq: Four times a day (QID) | INTRAMUSCULAR | Status: DC | PRN

## 2022-12-05 MED ORDER — ENSURE ENLIVE PO LIQD
237.0000 mL | Freq: Three times a day (TID) | ORAL | Status: DC
  Administered 2022-12-05 – 2022-12-07 (×6): 237 mL via ORAL

## 2022-12-05 MED ORDER — ALBUMIN HUMAN 25 % IV SOLN
50.0000 g | Freq: Three times a day (TID) | INTRAVENOUS | Status: AC
  Administered 2022-12-05 (×3): 50 g via INTRAVENOUS
  Filled 2022-12-05 (×3): qty 200

## 2022-12-05 MED ORDER — THIAMINE MONONITRATE 100 MG PO TABS
100.0000 mg | ORAL_TABLET | Freq: Every day | ORAL | Status: DC
  Administered 2022-12-05 – 2022-12-07 (×3): 100 mg via ORAL
  Filled 2022-12-05 (×8): qty 1

## 2022-12-05 MED ORDER — POTASSIUM CHLORIDE CRYS ER 20 MEQ PO TBCR
40.0000 meq | EXTENDED_RELEASE_TABLET | Freq: Two times a day (BID) | ORAL | Status: AC
  Administered 2022-12-05 – 2022-12-06 (×4): 40 meq via ORAL
  Filled 2022-12-05 (×4): qty 2

## 2022-12-05 MED ORDER — RIFAXIMIN 550 MG PO TABS
550.0000 mg | ORAL_TABLET | Freq: Two times a day (BID) | ORAL | Status: DC
  Administered 2022-12-05 – 2022-12-07 (×6): 550 mg via ORAL
  Filled 2022-12-05 (×6): qty 1

## 2022-12-05 MED ORDER — FOLIC ACID 1 MG PO TABS
1.0000 mg | ORAL_TABLET | Freq: Every day | ORAL | Status: DC
  Administered 2022-12-05 – 2022-12-07 (×3): 1 mg via ORAL
  Filled 2022-12-05 (×3): qty 1

## 2022-12-05 MED ORDER — ACETAMINOPHEN 650 MG RE SUPP: 650.0000 mg | Freq: Four times a day (QID) | RECTAL | Status: DC | PRN

## 2022-12-05 MED ORDER — METOPROLOL TARTRATE 5 MG/5ML IV SOLN
5.0000 mg | Freq: Once | INTRAVENOUS | Status: AC
  Administered 2022-12-05: 5 mg via INTRAVENOUS
  Filled 2022-12-05: qty 5

## 2022-12-05 MED ORDER — METOPROLOL TARTRATE 25 MG PO TABS
12.5000 mg | ORAL_TABLET | Freq: Two times a day (BID) | ORAL | Status: DC
  Administered 2022-12-05 – 2022-12-07 (×6): 12.5 mg via ORAL
  Filled 2022-12-05 (×6): qty 1

## 2022-12-05 MED ORDER — OXYCODONE HCL 5 MG PO TABS
5.0000 mg | ORAL_TABLET | ORAL | Status: DC | PRN
  Administered 2022-12-05 – 2022-12-07 (×8): 5 mg via ORAL
  Filled 2022-12-05 (×8): qty 1

## 2022-12-05 MED ORDER — SPIRONOLACTONE 25 MG PO TABS
25.0000 mg | ORAL_TABLET | Freq: Every day | ORAL | Status: DC
  Administered 2022-12-05 – 2022-12-07 (×3): 25 mg via ORAL
  Filled 2022-12-05 (×3): qty 1

## 2022-12-05 MED ORDER — NICOTINE 14 MG/24HR TD PT24
14.0000 mg | MEDICATED_PATCH | Freq: Every day | TRANSDERMAL | Status: DC
  Administered 2022-12-05 – 2022-12-07 (×3): 14 mg via TRANSDERMAL
  Filled 2022-12-05 (×3): qty 1

## 2022-12-05 MED ORDER — GUAIFENESIN-DM 100-10 MG/5ML PO SYRP
5.0000 mL | ORAL_SOLUTION | ORAL | Status: DC | PRN
  Administered 2022-12-05 – 2022-12-06 (×3): 5 mL via ORAL
  Filled 2022-12-05 (×3): qty 5

## 2022-12-05 MED ORDER — MAGNESIUM SULFATE 2 GM/50ML IV SOLN
2.0000 g | Freq: Once | INTRAVENOUS | Status: AC
  Administered 2022-12-05: 2 g via INTRAVENOUS
  Filled 2022-12-05: qty 50

## 2022-12-05 MED ORDER — PANTOPRAZOLE SODIUM 40 MG PO TBEC
40.0000 mg | DELAYED_RELEASE_TABLET | Freq: Two times a day (BID) | ORAL | Status: DC
  Administered 2022-12-05 – 2022-12-07 (×6): 40 mg via ORAL
  Filled 2022-12-05 (×6): qty 1

## 2022-12-05 MED ORDER — ADULT MULTIVITAMIN W/MINERALS CH
1.0000 | ORAL_TABLET | Freq: Every day | ORAL | Status: DC
  Administered 2022-12-05 – 2022-12-07 (×3): 1 via ORAL
  Filled 2022-12-05 (×3): qty 1

## 2022-12-05 MED ORDER — HEPARIN SODIUM (PORCINE) 5000 UNIT/ML IJ SOLN
5000.0000 [IU] | Freq: Three times a day (TID) | INTRAMUSCULAR | Status: DC
  Administered 2022-12-05 – 2022-12-07 (×8): 5000 [IU] via SUBCUTANEOUS
  Filled 2022-12-05 (×8): qty 1

## 2022-12-05 MED ORDER — ONDANSETRON HCL 4 MG PO TABS: 4.0000 mg | ORAL_TABLET | Freq: Four times a day (QID) | ORAL | Status: DC | PRN

## 2022-12-05 MED ORDER — ACETAMINOPHEN 325 MG PO TABS
650.0000 mg | ORAL_TABLET | Freq: Four times a day (QID) | ORAL | Status: DC | PRN
  Administered 2022-12-05: 650 mg via ORAL
  Filled 2022-12-05: qty 2

## 2022-12-05 MED ORDER — LACTULOSE 10 GM/15ML PO SOLN
20.0000 g | Freq: Three times a day (TID) | ORAL | Status: DC
  Administered 2022-12-05 – 2022-12-07 (×6): 20 g via ORAL
  Filled 2022-12-05 (×6): qty 30

## 2022-12-05 MED ORDER — FUROSEMIDE 10 MG/ML IJ SOLN
40.0000 mg | Freq: Two times a day (BID) | INTRAMUSCULAR | Status: DC
  Administered 2022-12-05 – 2022-12-07 (×5): 40 mg via INTRAVENOUS
  Filled 2022-12-05 (×5): qty 4

## 2022-12-05 NOTE — TOC Progression Note (Signed)
  Transition of Care [facility]) Screening Note   Patient Details  Name: Mallory Mendoza Date of Birth: 12-31-1980   Transition of Care [facility]) CM/SW Contact:    Boneta Lucks, RN Phone Number: 12/05/2022, 1:44 PM    Transition of Care Department [facility]) has reviewed patient and no TOC needs have been identified at this time. We will continue to monitor patient advancement through interdisciplinary progression rounds. If new patient transition needs arise, please place a TOC consult.       Barriers to Discharge: Continued Medical Work up  Expected Discharge Plan and Services                                               Social Determinants of Health (SDOH) Interventions SDOH Screenings   Food Insecurity: No Food Insecurity (12/05/2022)  Housing: Low Risk  (12/05/2022)  Recent Concern: Housing - Medium Risk (10/31/2022)  Transportation Needs: No Transportation Needs (12/05/2022)  Recent Concern: Transportation Needs - Unmet Transportation Needs (10/31/2022)  Utilities: Not At Risk (12/05/2022)  Alcohol Screen: Medium Risk (03/11/2021)  Depression (PHQ2-9): Medium Risk (03/11/2021)  Financial Resource Strain: Medium Risk (03/11/2021)  Physical Activity: Insufficiently Active (03/11/2021)  Social Connections: Moderately Integrated (03/11/2021)  Stress: Stress Concern Present (03/11/2021)  Tobacco Use: High Risk (12/04/2022)
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
## 2022-12-05 NOTE — Assessment & Plan Note (Addendum)
-  Most likely driven by underlying gastroesophageal reflux and community-acquired pneumonia. -No abnormalities appreciated on EKG or telemetry -Negative troponin. -Continue treatment with PPI. -Patient will also complete 5 more days of oral antibiotics to complete treatment of left upper lobe CAP. -Repeat chest x-ray in 6-8 weeks to assure resolution of infiltrates.

## 2022-12-05 NOTE — Progress Notes (Signed)
Patient seen and examined; admitted after midnight secondary to anasarca and hypokalemia; patient with underlying history of alcoholic cirrhosis with recent hospitalization secondary to decompensated condition.  Apparently patient has not been compliant with medication and expresses significant difficulty with ambulation and deconditioning.  Vital signs overall stable and no suggesting the presence of acute infection.  Good saturation on room air.  Please refer to H&P written by Dr. Clearence Ped on 12/05/22 for further info/details on admission.  Plan: -Continue aggressive treatment with albumin and IV Lasix -Continue the use of a spironolactone -Low-sodium diet, daily weights and strict intake and output -Replete electrolytes and follow trend -GI service will be curbside for further recommendations and if needed medication adjustment into her condition. -MELD score 18 currently -Continue the use of lactulose and rifaximin.  Barton Dubois MD [phone]

## 2022-12-05 NOTE — Assessment & Plan Note (Addendum)
-  With peripheral edema, mild ascites and anasarca. - Patient had received albumin infusion, IV diuresis and adjusted dose of spironolactone. -At discharge will continue the use of oral Lasix (40 mg daily), spironolactone (50 mg), rifaximin, lactulose and instructions to follow low-sodium diet. -Patient is no longer drinking alcohol.  (Complete abstinence has been encouraged). -Advised to maintain adequate hydration and to follow-up with gastroenterology service as an outpatient. -MELD score 18.

## 2022-12-05 NOTE — Assessment & Plan Note (Addendum)
-  Continue PPI. °-Lifestyle changes discussed with patient. °

## 2022-12-05 NOTE — Assessment & Plan Note (Addendum)
-  Potassium was 2.6 at time of admission -Repleted and currently stable and within normal limits -Repeat blood work at follow-up visit to assess electrolytes trend/stability.

## 2022-12-05 NOTE — Progress Notes (Signed)
Patient arrived to room 323 around 0038. She is oriented to room. Up to [facility] with 1 assist. Purewick in place d/t frequent urination. 3+ pitting edema noted to bilateral lower extremities.3 runs of IV potassium completed. Patient is resting well at this time.

## 2022-12-06 DIAGNOSIS — R601 Generalized edema: Secondary | ICD-10-CM | POA: Diagnosis not present

## 2022-12-06 DIAGNOSIS — J189 Pneumonia, unspecified organism: Secondary | ICD-10-CM

## 2022-12-06 DIAGNOSIS — K219 Gastro-esophageal reflux disease without esophagitis: Secondary | ICD-10-CM | POA: Diagnosis not present

## 2022-12-06 DIAGNOSIS — E876 Hypokalemia: Secondary | ICD-10-CM | POA: Diagnosis not present

## 2022-12-06 DIAGNOSIS — K7031 Alcoholic cirrhosis of liver with ascites: Secondary | ICD-10-CM | POA: Diagnosis not present

## 2022-12-06 LAB — COMPREHENSIVE METABOLIC PANEL
ALT: 29 U/L (ref 0–44)
AST: 89 U/L — ABNORMAL HIGH (ref 15–41)
Albumin: 4.4 g/dL (ref 3.5–5.0)
Alkaline Phosphatase: 108 U/L (ref 38–126)
Anion gap: 11 (ref 5–15)
BUN: 9 mg/dL (ref 6–20)
CO2: 29 mmol/L (ref 22–32)
Calcium: 9.6 mg/dL (ref 8.9–10.3)
Chloride: 95 mmol/L — ABNORMAL LOW (ref 98–111)
Creatinine, Ser: 0.56 mg/dL (ref 0.44–1.00)
GFR, Estimated: >60 mL/min (ref >60)
Glucose, Bld: 92 mg/dL (ref 70–99)
Potassium: 3.6 mmol/L (ref 3.5–5.1)
Sodium: 135 mmol/L (ref 135–145)
Total Bilirubin: 4.3 mg/dL — ABNORMAL HIGH (ref 0.3–1.2)
Total Protein: 7.5 g/dL (ref 6.5–8.1)

## 2022-12-06 MED ORDER — CEFDINIR 300 MG PO CAPS
300.0000 mg | ORAL_CAPSULE | Freq: Two times a day (BID) | ORAL | Status: DC
  Administered 2022-12-06 – 2022-12-07 (×3): 300 mg via ORAL
  Filled 2022-12-06 (×3): qty 1

## 2022-12-06 NOTE — Progress Notes (Addendum)
Progress Note   Patient: Mallory Mendoza YIF:[id number] DOB: [birth year] DOA: 12/04/2022     1 DOS: the patient was seen and examined on 12/06/2022   Brief hospital course: As per H&P written by Dr.Zierle-Ghosh on 12/05/22 MONCIA ANNAS is a 42 y.o. female with medical history significant of alcoholic cirrhosis, tobacco use disorder, GERD, history of alcohol abuse, and more presents the ED with a chief complaint of edema.  Patient reports that she was admitted around Christmas, which time she was diagnosed with cirrhosis.  Since that discharge she has had progressively worsening edema.  She reports that it is to the point now where she cannot ambulate at home.  She was ambulating with a walker, but with the edema this bad, she has to have constant assistance from her son as well as the walker.  Patient reports she has been taking Lasix.  She has had normal urine output.  She has had a normal appetite, and reports that she is eating all the time.  She tries to keep her legs up.  She has compression socks but cannot get them on.  It is just become too much to work with at home.  She does have physical therapy at home, but reports that she could not ambulate with her edema. Patient reports that she quit drinking.  Her last drink was before Thanksgiving. On review of systems she does admit to shortness of breath with exertion and orthopnea.  She has had no change in her smoker's cough.  She does report that she is out of breath if she lays on her right side. Patient complains of chest pain during my interview.  It substernal/epigastric.  It is different than her normal epigastric burning pain that she associates with GERD.  This pain is waxing and waning.  I think it is likely due to anxiety. Patient currently smokes half pack per day.  Her last drink was before Thanksgiving.  She does not use illicit drugs.  Patient is full code.  Assessment and Plan: * Hypokalemia - Potassium 2.6 at time of  admission -Repleted and currently stable and within normal limit -Continue to follow electrolytes trend with ongoing diuresis.  Alcoholic cirrhosis of liver with ascites ([place]) - With peripheral edema, mild ascites and anasarca. - Patient had received albumin infusion and will continue treatment with IV Lasix and the spironolactone. -Continue rifaximin, lactulose and encourage low-sodium diet.   -Patient is no longer drinking alcohol -MELD score 18.  -Continue to monitor  CAP (community acquired pneumonia) -Not requiring oxygen supplementation -Will treat with cefdinir twice a day -Provide supportive care. -Left upper lobe infiltrate appreciated.  GERD (gastroesophageal reflux disease) - Continue PPI  Chest pain - Most likely driven by underlying gastroesophageal reflux. -No abnormalities appreciated on EKG or telemetry -Negative troponin. -Continue treatment with PPI.  Tobacco use disorder -Cessation counseling provided -Continue the use of nicotine patch while inpatient.   Subjective:  Oriented x 3; no chest pain, no nausea, no vomiting.  Still demonstrating signs of anasarca on examination.  Physical Exam: Vitals:   12/05/22 2354 12/06/22 0147 12/06/22 0549 12/06/22 1329  BP: 104/65 116/73 114/68 129/65  Pulse: (!) 116 (!) 117 (!) 116 (!) 114  Resp: '20 20 20 18  '$ Temp: 99.7 F (37.6 C) 98.9 F (37.2 C) 98.9 F (37.2 C) 99.5 F (37.5 C)  TempSrc: Oral Oral Oral Oral  SpO2: 100% 100% 100% 100%  Weight:   66.6 kg   Height:  General exam: Alert, awake, oriented x 3; reporting feeling better today; no nausea, no vomiting, no chest pain, no palpitations or abdominal pain.  Still noticing increased swelling in her lower extremities and around her torso. Respiratory system: Clear to auscultation. Respiratory effort normal.  Good saturation on room air; no using accessory muscles. Cardiovascular system: Mild sinus tachycardia appreciated; no rubs, no gallops, no  JVD. Gastrointestinal system: Abdomen is slightly distended; mild ascites appreciated on exam.  Overall denying guarding and tenderness on palpation.  Positive bowel sounds appreciated. Central nervous system: Alert and oriented. No focal neurological deficits.  Following commands appropriately. Extremities: No cyanosis or clubbing; 2-3+ edema appreciated bilaterally. Skin: No petechiae. Psychiatry: Judgement and insight appear normal. Mood & affect appropriate.   Data Reviewed: Comprehensive metabolic panel: Sodium 497, potassium 3.6, chloride 95, bicarb 29, glucose 92, BUN 9, creatinine 0.56, AST 89, ALT 29 and alkaline phosphatase 108; GFR more than 60.  Family Communication: No family at bedside.  Patient expressed that she will update family members on her own.  Disposition: Status is: Inpatient Remains inpatient appropriate because: Continue diuresis and electrolyte repletion; patient is still demonstrating anasarca.  Planned Discharge Destination: Home   Time spent: 40 minutes  Author: Barton Dubois, MD 12/06/2022 2:18 PM  For on call review [email or web address].

## 2022-12-06 NOTE — Progress Notes (Signed)
Patient rested well. Up to [facility] x 1. Albumin administered as ordered to s/sx of adverse reactions.

## 2022-12-06 NOTE — Progress Notes (Signed)
Patient is resting in bed. VS obtained. Yellow mews triggered. Yellow mews protocol initiated. No s/sx of distress. Patient denies chest pain, palpitations, or dizziness. Notified CN Fredrich Birks, RN and Dr. Clearence Ped. See new orders. EKG completed. Medications administered as ordered.      12/05/22 2000  Assess: MEWS Score  Temp 100.1 F (37.8 C)  BP (!) 142/75  Pulse Rate (!) 133 (told rn pulse up)  Resp 20  SpO2 100 %  O2 Device Room Air  Assess: MEWS Score  MEWS Temp 0  MEWS Systolic 0  MEWS Pulse 3  MEWS RR 0  MEWS LOC 0  MEWS Score 3  MEWS Score Color Yellow  Assess: if the MEWS score is Yellow or Red  Were vital signs taken at a resting state? Yes  Focused Assessment No change from prior assessment  Does the patient meet 2 or more of the SIRS criteria? No  MEWS guidelines implemented *See Row Information* Yes  Treat  MEWS Interventions Administered scheduled meds/treatments  Take Vital Signs  Increase Vital Sign Frequency  Yellow: Q 2hr X 2 then Q 4hr X 2, if remains yellow, continue Q 4hrs  Escalate  MEWS: Escalate Yellow: discuss with charge nurse/RN and consider discussing with provider and RRT  Notify: Charge Nurse/RN  Name of Charge Nurse/RN Notified Daneil Dolin, RN  Date Charge Nurse/RN Notified 12/06/22  Time Charge Nurse/RN Notified 2000  Provider Notification  Provider Name/Title Zierle-Ghosh  Date Provider Notified 12/06/22  Time Provider Notified 2000  Method of Notification Page  Notification Reason Other (Comment) (yellow mews)  Provider response See new orders  Date of Provider Response 12/06/22  Time of Provider Response 2011  Document  Patient Outcome Other (Comment) (remains on unit)  Progress note created (see row info) Yes  Assess: SIRS CRITERIA  SIRS Temperature  0  SIRS Pulse 1  SIRS Respirations  0  SIRS WBC 0  SIRS Score Sum  1

## 2022-12-06 NOTE — Assessment & Plan Note (Addendum)
-  Cessation counseling provided -Merrilee Seashore patch prescription provided at time of discharge.

## 2022-12-06 NOTE — Assessment & Plan Note (Addendum)
-  Not requiring oxygen supplementation -Will treat with cefdinir twice a day -Continue as needed mucolytic's/antitussive medication -Patient afebrile and hemodynamically stable for discharge. -Repeat chest x-ray in 6-8 weeks to assure resolution of infiltrates.

## 2022-12-07 ENCOUNTER — Ambulatory Visit (HOSPITAL_COMMUNITY): Payer: Medicaid Other | Admitting: Physical Therapy

## 2022-12-07 DIAGNOSIS — R601 Generalized edema: Secondary | ICD-10-CM | POA: Diagnosis not present

## 2022-12-07 DIAGNOSIS — R079 Chest pain, unspecified: Secondary | ICD-10-CM | POA: Diagnosis not present

## 2022-12-07 DIAGNOSIS — E876 Hypokalemia: Secondary | ICD-10-CM | POA: Diagnosis not present

## 2022-12-07 DIAGNOSIS — F17200 Nicotine dependence, unspecified, uncomplicated: Secondary | ICD-10-CM | POA: Diagnosis not present

## 2022-12-07 MED ORDER — NICOTINE 14 MG/24HR TD PT24: 14.0000 mg | MEDICATED_PATCH | Freq: Every day | TRANSDERMAL | 1 refills | Status: DC

## 2022-12-07 MED ORDER — CEFDINIR 300 MG PO CAPS: 300.0000 mg | ORAL_CAPSULE | Freq: Two times a day (BID) | ORAL | 0 refills | Status: AC

## 2022-12-07 MED ORDER — FUROSEMIDE 40 MG PO TABS: 40.0000 mg | ORAL_TABLET | Freq: Every morning | ORAL | 2 refills | Status: DC

## 2022-12-07 MED ORDER — GUAIFENESIN-DM 100-10 MG/5ML PO SYRP: 5.0000 mL | ORAL_SOLUTION | Freq: Four times a day (QID) | ORAL | 0 refills | Status: DC | PRN

## 2022-12-07 MED ORDER — METOPROLOL TARTRATE 25 MG PO TABS: 25.0000 mg | ORAL_TABLET | Freq: Two times a day (BID) | ORAL | 1 refills | Status: DC

## 2022-12-07 MED ORDER — ENSURE ENLIVE PO LIQD: 237.0000 mL | Freq: Two times a day (BID) | ORAL | Status: DC

## 2022-12-07 NOTE — Progress Notes (Signed)
Earlier bp was 97/64 and 102/66 pulse 117.  Dr. Dyann Kief made aware and messaged ok to give lasix and bp meds.  Edema to lower extremities +4 but patient states they are improved.  Ted hose placed

## 2022-12-07 NOTE — Discharge Summary (Signed)
Physician Discharge Summary   Patient: Mallory Mendoza MRN: [id number] DOB: May 28, 1981  Admit date:     12/04/2022  Discharge date: 12/07/22  Discharge Physician: Barton Dubois   PCP: Carrolyn Meiers, MD   Recommendations at discharge:  Repeat complete metabolic panel to follow electrolytes, renal function and LFTs -Repeat CBC to follow hemoglobin trend and stability Reassess blood pressure and further adjust diuretic regimen. Continue assisting patient with tobacco cessation. Repeat chest x-ray in 6-8 weeks to assure complete resolution of infiltrates.  Discharge Diagnoses: Principal Problem:   Hypokalemia Active Problems:   Alcoholic cirrhosis of liver with ascites (HCC)   Tobacco use disorder   Chest pain   GERD (gastroesophageal reflux disease)   CAP (community acquired pneumonia)   Anasarca  Hospital Course: As per H&P written by Dr.Zierle-Ghosh on 12/05/22 Mallory Mendoza is a 42 y.o. female with medical history significant of alcoholic cirrhosis, tobacco use disorder, GERD, history of alcohol abuse, and more presents the ED with a chief complaint of edema.  Patient reports that she was admitted around Christmas, which time she was diagnosed with cirrhosis.  Since that discharge she has had progressively worsening edema.  She reports that it is to the point now where she cannot ambulate at home.  She was ambulating with a walker, but with the edema this bad, she has to have constant assistance from her son as well as the walker.  Patient reports she has been taking Lasix.  She has had normal urine output.  She has had a normal appetite, and reports that she is eating all the time.  She tries to keep her legs up.  She has compression socks but cannot get them on.  It is just become too much to work with at home.  She does have physical therapy at home, but reports that she could not ambulate with her edema. Patient reports that she quit drinking.  Her last drink was before  Thanksgiving. On review of systems she does admit to shortness of breath with exertion and orthopnea.  She has had no change in her smoker's cough.  She does report that she is out of breath if she lays on her right side. Patient complains of chest pain during my interview.  It substernal/epigastric.  It is different than her normal epigastric burning pain that she associates with GERD.  This pain is waxing and waning.  I think it is likely due to anxiety. Patient currently smokes half pack per day.  Her last drink was before Thanksgiving.  She does not use illicit drugs.  Patient is full code.  Assessment and Plan: * Hypokalemia - Potassium was 2.6 at time of admission -Repleted and currently stable and within normal limits -Repeat blood work at follow-up visit to assess electrolytes trend/stability.  Alcoholic cirrhosis of liver with ascites ([place]) - With peripheral edema, mild ascites and anasarca. - Patient had received albumin infusion, IV diuresis and adjusted dose of spironolactone. -At discharge will continue the use of oral Lasix (40 mg daily), spironolactone (50 mg), rifaximin, lactulose and instructions to follow low-sodium diet. -Patient is no longer drinking alcohol.  (Complete abstinence has been encouraged). -Advised to maintain adequate hydration and to follow-up with gastroenterology service as an outpatient. -MELD score 18.    Anasarca -Associated with hypoalbuminemia and underlying cirrhosis -Patient instructed to follow low-sodium diet while maintaining adequate hydration -Continue treatment with Lasix and spironolactone. -Outpatient follow-up with GI service.  CAP (community acquired pneumonia) -Not requiring oxygen  supplementation -Will treat with cefdinir twice a day -Continue as needed mucolytic's/antitussive medication -Patient afebrile and hemodynamically stable for discharge. -Repeat chest x-ray in 6-8 weeks to assure resolution of infiltrates.  GERD  (gastroesophageal reflux disease) -Continue PPI -Lifestyle changes discussed with patient.  Chest pain - Most likely driven by underlying gastroesophageal reflux and community-acquired pneumonia. -No abnormalities appreciated on EKG or telemetry -Negative troponin. -Continue treatment with PPI. -Patient will also complete 5 more days of oral antibiotics to complete treatment of left upper lobe CAP. -Repeat chest x-ray in 6-8 weeks to assure resolution of infiltrates.  Tobacco use disorder -Cessation counseling provided -Merrilee Seashore patch prescription provided at time of discharge.   Consultants: GI service curbside (Dr. Jenetta Downer). Procedures performed: See below for x-ray reports. Disposition: Home Diet recommendation: Low-sodium diet.  DISCHARGE MEDICATION: Allergies as of 12/07/2022   No Known Allergies      Medication List     STOP taking these medications    prednisoLONE 15 MG/5ML solution Commonly known as: ORAPRED       TAKE these medications    cefdinir 300 MG capsule Commonly known as: OMNICEF Take 1 capsule (300 mg total) by mouth every 12 (twelve) hours for 5 days.   feeding supplement Liqd Take 237 mLs by mouth 2 (two) times daily between meals. What changed: when to take this   folic acid 1 MG tablet Commonly known as: FOLVITE Take 1 tablet (1 mg total) by mouth daily.   furosemide 40 MG tablet Commonly known as: LASIX Take 1 tablet (40 mg total) by mouth every morning. What changed:  medication strength how much to take   gabapentin 100 MG capsule Commonly known as: NEURONTIN Take 100 mg by mouth at bedtime.   guaiFENesin-dextromethorphan 100-10 MG/5ML syrup Commonly known as: ROBITUSSIN DM Take 5 mLs by mouth every 6 (six) hours as needed for cough.   lactulose 10 GM/15ML solution Commonly known as: CHRONULAC Take 30 mLs (20 g total) by mouth 3 (three) times daily.   metoprolol tartrate 25 MG tablet Commonly known as: LOPRESSOR Take 1  tablet (25 mg total) by mouth 2 (two) times daily. What changed: how much to take   multivitamin with minerals Tabs tablet Take 1 tablet by mouth daily.   nicotine 14 mg/24hr patch Commonly known as: NICODERM CQ - dosed in mg/24 hours Place 1 patch (14 mg total) onto the skin daily.   ondansetron 4 MG tablet Commonly known as: Zofran Take 1 tablet (4 mg total) by mouth every 8 (eight) hours as needed for nausea or vomiting.   oxyCODONE 5 MG immediate release tablet Commonly known as: Oxy IR/ROXICODONE Take 5 mg by mouth every 6 (six) hours as needed for moderate pain.   pantoprazole 40 MG tablet Commonly known as: Protonix Take 1 tablet (40 mg total) by mouth 2 (two) times daily.   rifaximin 550 MG Tabs tablet Commonly known as: XIFAXAN Take 1 tablet (550 mg total) by mouth 2 (two) times daily.   spironolactone 25 MG tablet Commonly known as: ALDACTONE Take 25 mg by mouth 2 (two) times daily.   thiamine 100 MG tablet Commonly known as: VITAMIN B1 Take 1 tablet (100 mg total) by mouth daily.        Follow-up Information     Fanta, Normajean Baxter, MD. Schedule an appointment as soon as possible for a visit in 10 day(s).   Specialty: Internal Medicine Contact information: [address] [place] [address] [phone]  Discharge Exam: Filed Weights   12/04/22 1825 12/06/22 0549 12/07/22 0543  Weight: 74.8 kg 66.6 kg 63.7 kg   General exam: Alert, awake, oriented x 3; feeling much better and expressing significant improvement in her fluid status.  Patient is ready to go home. Respiratory system: Clear to auscultation. Respiratory effort normal.  No using accessory muscles and demonstrating good saturation on room air. Cardiovascular system: Rate controlled, no rubs, no gallops, no JVD. Gastrointestinal system: Abdomen is mildly distended due to ascites; no guarding, no tenderness on palpation, positive bowel sounds and no  rebound. Central nervous system: Alert and oriented. No focal neurological deficits. Extremities: No cyanosis or clubbing; 1+ edema bilaterally. Skin: No petechiae. Psychiatry: Judgement and insight appear normal. Mood & affect appropriate.    Condition at discharge: Stable and improved.  The results of significant diagnostics from this hospitalization (including imaging, microbiology, ancillary and laboratory) are listed below for reference.   Imaging Studies: DG CHEST PORT 1 VIEW  Result Date: 12/05/2022 CLINICAL DATA:  Chest pain EXAM: PORTABLE CHEST 1 VIEW COMPARISON:  10/30/2022 FINDINGS: Heart is normal size. Mediastinal contours within normal limits. Consolidation in the left upper lobe compatible with pneumonia. Right lung clear. Small left pleural effusion. No acute bony abnormality. IMPRESSION: Left upper lobe pneumonia with small left effusion. Electronically Signed   By: Rolm Baptise M.D.   On: 12/05/2022 09:26    Microbiology: Results for orders placed or performed during the hospital encounter of 10/30/22  Urine Culture     Status: None   Collection Time: 10/30/22  7:38 PM   Specimen: Urine, Clean Catch  Result Value Ref Range Status   Specimen Description   Final    URINE, CLEAN CATCH Performed at [facility], [address] [place], [place] [address]    Special Requests   Final    NONE Performed at [facility], [address] [place], [place] [address]    Culture   Final    NO GROWTH Performed at [facility], [place] [address], [place], [place] [address]    Report Status 11/01/2022 FINAL  Final  Culture, blood (Routine X 2) w Reflex to ID Panel     Status: None   Collection Time: 10/31/22 10:04 AM   Specimen: BLOOD RIGHT ARM  Result Value Ref Range Status   Specimen Description   Final    BLOOD RIGHT ARM BOTTLES DRAWN AEROBIC AND ANAEROBIC   Special Requests Blood Culture adequate volume  Final   Culture   Final    NO GROWTH 5 DAYS Performed at [facility], [address] [place], [place] [address]    Report Status 11/05/2022 FINAL  Final  Culture, blood (Routine X 2) w Reflex to ID Panel     Status: None   Collection Time: 10/31/22 10:04 AM   Specimen: BLOOD RIGHT HAND  Result Value Ref Range Status   Specimen Description   Final    BLOOD RIGHT HAND BOTTLES DRAWN AEROBIC AND ANAEROBIC   Special Requests Blood Culture adequate volume  Final   Culture   Final    NO GROWTH 5 DAYS Performed at [facility], [address], [place], [place] [address]    Report Status 11/05/2022 FINAL  Final    Labs: CBC: Recent Labs  Lab 12/04/22 2106 12/05/22 0449  WBC 19.2* 19.0*  NEUTROABS 17.4* 16.2*  HGB 9.1* 7.9*  HCT 28.6* 24.7*  MCV 102.5* 101.6*  PLT 256 947   Basic Metabolic  Panel: Recent Labs  Lab 12/04/22 2106 12/05/22 0449 12/06/22 0849  NA 135 136 135  K 2.6* 3.0* 3.6  CL 101 103 95*  CO2 '22 23 29  '$ GLUCOSE 171* 91 92  BUN '8 8 9  '$ CREATININE 0.60 0.54 0.56  CALCIUM 8.4* 8.3* 9.6  MG 1.7 1.6*  --    Liver Function Tests: Recent Labs  Lab 12/04/22 2106 12/05/22 0449 12/06/22 0849  AST 80* 52* 89*  ALT 37 31 29  ALKPHOS 146* 134* 108  BILITOT 3.5* 3.0* 4.3*  PROT 6.8 6.2* 7.5  ALBUMIN 2.5* 2.4* 4.4   CBG: No results for input(s): "GLUCAP" in the last 168 hours.  Discharge time spent: greater than 30 minutes.  Signed: Barton Dubois, MD [facility] 12/07/2022

## 2022-12-07 NOTE — Assessment & Plan Note (Signed)
-  Associated with hypoalbuminemia and underlying cirrhosis -Patient instructed to follow low-sodium diet while maintaining adequate hydration -Continue treatment with Lasix and spironolactone. -Outpatient follow-up with GI service.

## 2022-12-07 NOTE — Progress Notes (Signed)
Assisted to bedside commode and patient bathed self.  IV removed and discharge instructions reviewed and scripts sent to pharmacy.  Transported by WC to entrance and son to drive home

## 2022-12-09 ENCOUNTER — Ambulatory Visit (HOSPITAL_COMMUNITY): Payer: Medicaid Other | Admitting: Physical Therapy

## 2022-12-09 ENCOUNTER — Other Ambulatory Visit: Payer: Self-pay

## 2022-12-09 ENCOUNTER — Telehealth (HOSPITAL_COMMUNITY): Payer: Self-pay | Admitting: Physical Therapy

## 2022-12-09 DIAGNOSIS — R262 Difficulty in walking, not elsewhere classified: Secondary | ICD-10-CM | POA: Diagnosis not present

## 2022-12-09 DIAGNOSIS — M6281 Muscle weakness (generalized): Secondary | ICD-10-CM | POA: Diagnosis not present

## 2022-12-09 DIAGNOSIS — N179 Acute kidney failure, unspecified: Secondary | ICD-10-CM | POA: Diagnosis not present

## 2022-12-09 DIAGNOSIS — R29898 Other symptoms and signs involving the musculoskeletal system: Secondary | ICD-10-CM | POA: Diagnosis not present

## 2022-12-09 NOTE — Patient Outreach (Signed)
12/09/22  Chancy Milroy Tobin Aug 14, 1981 [id number]  Managed Medicaid:  [facility] Liaison remote coverage review for patient admitted to [facility] patient transitioned home   Primary Care Provider:  Carrolyn Meiers, MD  Patient screened for less than 30 days readmission hospitalization with noted high risk score for unplanned readmission risk  and  to assess for potential Rule Management service needs for post hospital transition for care coordination.   Plan:  Call placed to patient at the phone number listed with a generic message and was able to leave a generic HIPAA appropriate voicemail message requesting a return call to this Probation officer.  Of note, [facility] Care Management/Population Health does not replace or interfere with any arrangements made by the Inpatient Transition of Care team.  For questions contact:   Natividad Brood, RN BSN [place]  [phone] business mobile phone Toll free office [phone]  *[place]  [phone] Fax number: [phone] [email or web address]

## 2022-12-09 NOTE — Telephone Encounter (Signed)
Pt did not show for appointment 12/07/22. Pt was admitted into hospital for LE edema and was discharged later in day.   Teena Irani, PTA/CLT [place] Ph: [phone]
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
## 2022-12-17 ENCOUNTER — Ambulatory Visit (HOSPITAL_COMMUNITY): Payer: Medicaid Other | Attending: Family Medicine | Admitting: Physical Therapy

## 2022-12-17 DIAGNOSIS — R29898 Other symptoms and signs involving the musculoskeletal system: Secondary | ICD-10-CM | POA: Diagnosis not present

## 2022-12-17 DIAGNOSIS — M6281 Muscle weakness (generalized): Secondary | ICD-10-CM | POA: Diagnosis not present

## 2022-12-17 DIAGNOSIS — R262 Difficulty in walking, not elsewhere classified: Secondary | ICD-10-CM | POA: Insufficient documentation
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
## 2022-12-20 ENCOUNTER — Encounter (HOSPITAL_COMMUNITY): Payer: Self-pay | Admitting: *Deleted

## 2022-12-20 ENCOUNTER — Emergency Department (HOSPITAL_COMMUNITY): Payer: Medicaid Other

## 2022-12-20 ENCOUNTER — Inpatient Hospital Stay (HOSPITAL_COMMUNITY)
Admission: EM | Admit: 2022-12-20 | Discharge: 2022-12-23 | DRG: 432 | Disposition: A | Discharge status: Home / Self Care | Payer: Medicaid Other | Attending: Internal Medicine | Admitting: Internal Medicine

## 2022-12-20 ENCOUNTER — Other Ambulatory Visit: Payer: Self-pay

## 2022-12-20 DIAGNOSIS — E8770 Fluid overload, unspecified: Secondary | ICD-10-CM | POA: Diagnosis present

## 2022-12-20 DIAGNOSIS — K7031 Alcoholic cirrhosis of liver with ascites: Principal | ICD-10-CM | POA: Diagnosis present

## 2022-12-20 DIAGNOSIS — D638 Anemia in other chronic diseases classified elsewhere: Secondary | ICD-10-CM | POA: Diagnosis present

## 2022-12-20 DIAGNOSIS — F17200 Nicotine dependence, unspecified, uncomplicated: Secondary | ICD-10-CM | POA: Diagnosis not present

## 2022-12-20 DIAGNOSIS — E876 Hypokalemia: Secondary | ICD-10-CM | POA: Diagnosis present

## 2022-12-20 DIAGNOSIS — K828 Other specified diseases of gallbladder: Secondary | ICD-10-CM | POA: Diagnosis not present

## 2022-12-20 DIAGNOSIS — E8809 Other disorders of plasma-protein metabolism, not elsewhere classified: Secondary | ICD-10-CM | POA: Diagnosis present

## 2022-12-20 DIAGNOSIS — N179 Acute kidney failure, unspecified: Secondary | ICD-10-CM | POA: Diagnosis present

## 2022-12-20 DIAGNOSIS — R601 Generalized edema: Secondary | ICD-10-CM | POA: Diagnosis not present

## 2022-12-20 DIAGNOSIS — R600 Localized edema: Secondary | ICD-10-CM | POA: Diagnosis present

## 2022-12-20 DIAGNOSIS — F32A Depression, unspecified: Secondary | ICD-10-CM | POA: Diagnosis present

## 2022-12-20 DIAGNOSIS — R0600 Dyspnea, unspecified: Secondary | ICD-10-CM | POA: Diagnosis not present

## 2022-12-20 DIAGNOSIS — R7989 Other specified abnormal findings of blood chemistry: Secondary | ICD-10-CM | POA: Insufficient documentation

## 2022-12-20 DIAGNOSIS — Z8249 Family history of ischemic heart disease and other diseases of the circulatory system: Secondary | ICD-10-CM

## 2022-12-20 DIAGNOSIS — R197 Diarrhea, unspecified: Secondary | ICD-10-CM | POA: Diagnosis not present

## 2022-12-20 DIAGNOSIS — R000 Tachycardia, unspecified: Secondary | ICD-10-CM | POA: Diagnosis not present

## 2022-12-20 DIAGNOSIS — K745 Biliary cirrhosis, unspecified: Secondary | ICD-10-CM | POA: Diagnosis present

## 2022-12-20 DIAGNOSIS — Z79899 Other long term (current) drug therapy: Secondary | ICD-10-CM | POA: Diagnosis not present

## 2022-12-20 DIAGNOSIS — R1084 Generalized abdominal pain: Secondary | ICD-10-CM

## 2022-12-20 DIAGNOSIS — J984 Other disorders of lung: Secondary | ICD-10-CM | POA: Diagnosis not present

## 2022-12-20 DIAGNOSIS — R200 Anesthesia of skin: Secondary | ICD-10-CM | POA: Diagnosis present

## 2022-12-20 DIAGNOSIS — K219 Gastro-esophageal reflux disease without esophagitis: Secondary | ICD-10-CM | POA: Diagnosis not present

## 2022-12-20 DIAGNOSIS — R202 Paresthesia of skin: Secondary | ICD-10-CM | POA: Diagnosis present

## 2022-12-20 DIAGNOSIS — R140 Abdominal distension (gaseous): Secondary | ICD-10-CM | POA: Diagnosis not present

## 2022-12-20 DIAGNOSIS — F419 Anxiety disorder, unspecified: Secondary | ICD-10-CM | POA: Diagnosis present

## 2022-12-20 DIAGNOSIS — F17210 Nicotine dependence, cigarettes, uncomplicated: Secondary | ICD-10-CM | POA: Diagnosis present

## 2022-12-20 DIAGNOSIS — K760 Fatty (change of) liver, not elsewhere classified: Secondary | ICD-10-CM | POA: Diagnosis present

## 2022-12-20 DIAGNOSIS — R161 Splenomegaly, not elsewhere classified: Secondary | ICD-10-CM | POA: Diagnosis not present

## 2022-12-20 DIAGNOSIS — R0602 Shortness of breath: Secondary | ICD-10-CM | POA: Diagnosis not present

## 2022-12-20 DIAGNOSIS — R17 Unspecified jaundice: Secondary | ICD-10-CM | POA: Diagnosis not present

## 2022-12-20 DIAGNOSIS — J189 Pneumonia, unspecified organism: Secondary | ICD-10-CM | POA: Diagnosis present

## 2022-12-20 DIAGNOSIS — R1900 Intra-abdominal and pelvic swelling, mass and lump, unspecified site: Secondary | ICD-10-CM | POA: Diagnosis present

## 2022-12-20 DIAGNOSIS — J90 Pleural effusion, not elsewhere classified: Secondary | ICD-10-CM | POA: Diagnosis present

## 2022-12-20 DIAGNOSIS — D649 Anemia, unspecified: Secondary | ICD-10-CM | POA: Diagnosis not present

## 2022-12-20 DIAGNOSIS — R918 Other nonspecific abnormal finding of lung field: Secondary | ICD-10-CM | POA: Diagnosis not present

## 2022-12-20 DIAGNOSIS — I700 Atherosclerosis of aorta: Secondary | ICD-10-CM | POA: Diagnosis not present

## 2022-12-20 DIAGNOSIS — R109 Unspecified abdominal pain: Secondary | ICD-10-CM | POA: Insufficient documentation

## 2022-12-20 DIAGNOSIS — I5031 Acute diastolic (congestive) heart failure: Secondary | ICD-10-CM | POA: Diagnosis not present

## 2022-12-20 DIAGNOSIS — K7030 Alcoholic cirrhosis of liver without ascites: Secondary | ICD-10-CM | POA: Diagnosis present

## 2022-12-20 DIAGNOSIS — Z91199 Patient's noncompliance with other medical treatment and regimen due to unspecified reason: Secondary | ICD-10-CM

## 2022-12-20 DIAGNOSIS — R188 Other ascites: Secondary | ICD-10-CM | POA: Diagnosis not present

## 2022-12-20 LAB — CBC WITH DIFFERENTIAL/PLATELET
Abs Immature Granulocytes: 0.04 10*3/uL (ref 0.00–0.07)
Basophils Absolute: 0 10*3/uL (ref 0.0–0.1)
Basophils Relative: 0 %
Eosinophils Absolute: 0.1 10*3/uL (ref 0.0–0.5)
Eosinophils Relative: 1 %
HCT: 26 % — ABNORMAL LOW (ref 36.0–46.0)
Hemoglobin: 8.4 g/dL — ABNORMAL LOW (ref 12.0–15.0)
Immature Granulocytes: 0 %
Lymphocytes Relative: 25 %
Lymphs Abs: 2.6 10*3/uL (ref 0.7–4.0)
MCH: 32.6 pg (ref 26.0–34.0)
MCHC: 32.3 g/dL (ref 30.0–36.0)
MCV: 100.8 fL — ABNORMAL HIGH (ref 80.0–100.0)
Monocytes Absolute: 1.1 10*3/uL — ABNORMAL HIGH (ref 0.1–1.0)
Monocytes Relative: 11 %
Neutro Abs: 6.5 10*3/uL (ref 1.7–7.7)
Neutrophils Relative %: 63 %
Platelets: 277 10*3/uL (ref 150–400)
RBC: 2.58 MIL/uL — ABNORMAL LOW (ref 3.87–5.11)
RDW: 19.1 % — ABNORMAL HIGH (ref 11.5–15.5)
WBC: 10.3 10*3/uL (ref 4.0–10.5)
nRBC: 0 % (ref 0.0–0.2)

## 2022-12-20 LAB — COMPREHENSIVE METABOLIC PANEL
ALT: 16 U/L (ref 0–44)
AST: 37 U/L (ref 15–41)
Albumin: 2.7 g/dL — ABNORMAL LOW (ref 3.5–5.0)
Alkaline Phosphatase: 112 U/L (ref 38–126)
Anion gap: 10 (ref 5–15)
BUN: 13 mg/dL (ref 6–20)
CO2: 21 mmol/L — ABNORMAL LOW (ref 22–32)
Calcium: 8.7 mg/dL — ABNORMAL LOW (ref 8.9–10.3)
Chloride: 103 mmol/L (ref 98–111)
Creatinine, Ser: 1.06 mg/dL — ABNORMAL HIGH (ref 0.44–1.00)
GFR, Estimated: >60 mL/min (ref >60)
Glucose, Bld: 118 mg/dL — ABNORMAL HIGH (ref 70–99)
Potassium: 3 mmol/L — ABNORMAL LOW (ref 3.5–5.1)
Sodium: 134 mmol/L — ABNORMAL LOW (ref 135–145)
Total Bilirubin: 3.4 mg/dL — ABNORMAL HIGH (ref 0.3–1.2)
Total Protein: 7 g/dL (ref 6.5–8.1)

## 2022-12-20 LAB — PROTIME-INR
INR: 1.6 — ABNORMAL HIGH (ref 0.8–1.2)
Prothrombin Time: 19.3 seconds — ABNORMAL HIGH (ref 11.4–15.2)

## 2022-12-20 LAB — AMMONIA: Ammonia: 20 umol/L (ref 9–35)

## 2022-12-20 LAB — BRAIN NATRIURETIC PEPTIDE: B Natriuretic Peptide: 169 pg/mL — ABNORMAL HIGH (ref 0.0–100.0)

## 2022-12-20 MED ORDER — SODIUM CHLORIDE 0.9 % IV SOLN
500.0000 mg | INTRAVENOUS | Status: DC
  Administered 2022-12-20: 500 mg via INTRAVENOUS
  Filled 2022-12-20: qty 5

## 2022-12-20 MED ORDER — ONDANSETRON HCL 4 MG PO TABS: 4.0000 mg | ORAL_TABLET | Freq: Four times a day (QID) | ORAL | Status: DC | PRN

## 2022-12-20 MED ORDER — DM-GUAIFENESIN ER 30-600 MG PO TB12
1.0000 | ORAL_TABLET | Freq: Two times a day (BID) | ORAL | Status: DC
  Administered 2022-12-21 – 2022-12-23 (×6): 1 via ORAL
  Filled 2022-12-20 (×6): qty 1

## 2022-12-20 MED ORDER — MAGNESIUM OXIDE -MG SUPPLEMENT 400 (240 MG) MG PO TABS
800.0000 mg | ORAL_TABLET | Freq: Once | ORAL | Status: AC
  Administered 2022-12-20: 800 mg via ORAL
  Filled 2022-12-20: qty 2

## 2022-12-20 MED ORDER — ONDANSETRON HCL 4 MG/2ML IJ SOLN: 4.0000 mg | Freq: Four times a day (QID) | INTRAMUSCULAR | Status: DC | PRN

## 2022-12-20 MED ORDER — FUROSEMIDE 10 MG/ML IJ SOLN
40.0000 mg | Freq: Once | INTRAMUSCULAR | Status: AC
  Administered 2022-12-20: 40 mg via INTRAVENOUS
  Filled 2022-12-20: qty 4

## 2022-12-20 MED ORDER — FUROSEMIDE 10 MG/ML IJ SOLN
40.0000 mg | Freq: Two times a day (BID) | INTRAMUSCULAR | Status: DC
  Administered 2022-12-21 – 2022-12-22 (×3): 40 mg via INTRAVENOUS
  Filled 2022-12-20 (×3): qty 4

## 2022-12-20 MED ORDER — ALBUMIN HUMAN 25 % IV SOLN
25.0000 g | Freq: Once | INTRAVENOUS | Status: AC
  Administered 2022-12-20: 25 g via INTRAVENOUS
  Filled 2022-12-20 (×2): qty 100

## 2022-12-20 MED ORDER — HYDROCODONE-ACETAMINOPHEN 5-325 MG PO TABS
1.0000 | ORAL_TABLET | Freq: Once | ORAL | Status: AC
  Administered 2022-12-20: 1 via ORAL
  Filled 2022-12-20: qty 1

## 2022-12-20 MED ORDER — IOHEXOL 300 MG/ML  SOLN
100.0000 mL | Freq: Once | INTRAMUSCULAR | Status: AC | PRN
  Administered 2022-12-20: 100 mL via INTRAVENOUS

## 2022-12-20 MED ORDER — SODIUM CHLORIDE 0.9 % IV SOLN
1.0000 g | INTRAVENOUS | Status: DC
  Administered 2022-12-21 – 2022-12-22 (×2): 1 g via INTRAVENOUS
  Filled 2022-12-20 (×3): qty 10

## 2022-12-20 MED ORDER — SODIUM CHLORIDE 0.9 % IV SOLN
2.0000 g | Freq: Once | INTRAVENOUS | Status: AC
  Administered 2022-12-20: 2 g via INTRAVENOUS
  Filled 2022-12-20: qty 20

## 2022-12-20 MED ORDER — POTASSIUM CHLORIDE CRYS ER 20 MEQ PO TBCR
40.0000 meq | EXTENDED_RELEASE_TABLET | Freq: Once | ORAL | Status: AC
  Administered 2022-12-20: 40 meq via ORAL
  Filled 2022-12-20: qty 2

## 2022-12-20 MED ORDER — ENOXAPARIN SODIUM 40 MG/0.4ML IJ SOSY: 40.0000 mg | PREFILLED_SYRINGE | INTRAMUSCULAR | Status: DC
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
## 2022-12-20 NOTE — ED Notes (Signed)
ED Provider at bedside. 
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
## 2022-12-20 NOTE — ED Notes (Addendum)
Patient off floor to x-ray at this time.

## 2022-12-20 NOTE — ED Triage Notes (Signed)
Pt BIB [facility] for c/o SOB since this morning. Pt with ascites, pt seen for same 1/19.

## 2022-12-21 ENCOUNTER — Inpatient Hospital Stay (HOSPITAL_COMMUNITY): Payer: Medicaid Other

## 2022-12-21 ENCOUNTER — Encounter (HOSPITAL_COMMUNITY): Payer: Self-pay | Admitting: Internal Medicine

## 2022-12-21 DIAGNOSIS — I5031 Acute diastolic (congestive) heart failure: Secondary | ICD-10-CM

## 2022-12-21 DIAGNOSIS — R601 Generalized edema: Secondary | ICD-10-CM | POA: Diagnosis not present

## 2022-12-21 DIAGNOSIS — R109 Unspecified abdominal pain: Secondary | ICD-10-CM | POA: Insufficient documentation

## 2022-12-21 DIAGNOSIS — D638 Anemia in other chronic diseases classified elsewhere: Secondary | ICD-10-CM | POA: Diagnosis present

## 2022-12-21 DIAGNOSIS — K7031 Alcoholic cirrhosis of liver with ascites: Secondary | ICD-10-CM | POA: Diagnosis not present

## 2022-12-21 LAB — COMPREHENSIVE METABOLIC PANEL
ALT: 15 U/L (ref 0–44)
AST: 31 U/L (ref 15–41)
Albumin: 2.7 g/dL — ABNORMAL LOW (ref 3.5–5.0)
Alkaline Phosphatase: 86 U/L (ref 38–126)
Anion gap: 10 (ref 5–15)
BUN: 13 mg/dL (ref 6–20)
CO2: 21 mmol/L — ABNORMAL LOW (ref 22–32)
Calcium: 8.7 mg/dL — ABNORMAL LOW (ref 8.9–10.3)
Chloride: 105 mmol/L (ref 98–111)
Creatinine, Ser: 1.05 mg/dL — ABNORMAL HIGH (ref 0.44–1.00)
GFR, Estimated: >60 mL/min (ref >60)
Glucose, Bld: 84 mg/dL (ref 70–99)
Potassium: 2.7 mmol/L — CL (ref 3.5–5.1)
Sodium: 136 mmol/L (ref 135–145)
Total Bilirubin: 2.9 mg/dL — ABNORMAL HIGH (ref 0.3–1.2)
Total Protein: 6.4 g/dL — ABNORMAL LOW (ref 6.5–8.1)

## 2022-12-21 LAB — IRON AND TIBC
Iron: 58 ug/dL (ref 28–170)
Saturation Ratios: 58 % — ABNORMAL HIGH (ref 10.4–31.8)
TIBC: 99 ug/dL — ABNORMAL LOW (ref 250–450)
UIBC: 41 ug/dL

## 2022-12-21 LAB — FERRITIN: Ferritin: 211 ng/mL (ref 11–307)

## 2022-12-21 LAB — MAGNESIUM: Magnesium: 1.8 mg/dL (ref 1.7–2.4)

## 2022-12-21 LAB — CBC
HCT: 23.8 % — ABNORMAL LOW (ref 36.0–46.0)
Hemoglobin: 7.6 g/dL — ABNORMAL LOW (ref 12.0–15.0)
MCH: 32.5 pg (ref 26.0–34.0)
MCHC: 31.9 g/dL (ref 30.0–36.0)
MCV: 101.7 fL — ABNORMAL HIGH (ref 80.0–100.0)
Platelets: 280 10*3/uL (ref 150–400)
RBC: 2.34 MIL/uL — ABNORMAL LOW (ref 3.87–5.11)
RDW: 19.3 % — ABNORMAL HIGH (ref 11.5–15.5)
WBC: 8.3 10*3/uL (ref 4.0–10.5)
nRBC: 0 % (ref 0.0–0.2)

## 2022-12-21 LAB — GRAM STAIN: Gram Stain: NONE SEEN

## 2022-12-21 LAB — ECHOCARDIOGRAM COMPLETE
AR max vel: 1.51 cm2
AV Area VTI: 1.74 cm2
AV Area mean vel: 1.6 cm2
AV Mean grad: 9.6 mmHg
AV Peak grad: 21.6 mmHg
Ao pk vel: 2.32 m/s
Area-P 1/2: 4.86 cm2
Height: 64 in
S' Lateral: 2.5 cm
Weight: 2278.67 oz

## 2022-12-21 LAB — PROTIME-INR
INR: 1.8 — ABNORMAL HIGH (ref 0.8–1.2)
Prothrombin Time: 20.3 seconds — ABNORMAL HIGH (ref 11.4–15.2)

## 2022-12-21 LAB — ALBUMIN, PLEURAL OR PERITONEAL FLUID: Albumin, Fluid: <1.5 g/dL

## 2022-12-21 LAB — BODY FLUID CELL COUNT WITH DIFFERENTIAL
Eos, Fluid: 0 %
Lymphs, Fluid: 52 %
Monocyte-Macrophage-Serous Fluid: 41 % — ABNORMAL LOW (ref 50–90)
Neutrophil Count, Fluid: 7 % (ref 0–25)
Total Nucleated Cell Count, Fluid: 351 cu mm (ref 0–1000)

## 2022-12-21 LAB — BILIRUBIN, DIRECT: Bilirubin, Direct: 1.6 mg/dL — ABNORMAL HIGH (ref 0.0–0.2)

## 2022-12-21 LAB — LACTATE DEHYDROGENASE, PLEURAL OR PERITONEAL FLUID: LD, Fluid: 34 U/L — ABNORMAL HIGH (ref 3–23)

## 2022-12-21 LAB — PHOSPHORUS: Phosphorus: 4.4 mg/dL (ref 2.5–4.6)

## 2022-12-21 LAB — PROCALCITONIN: Procalcitonin: 0.22 ng/mL

## 2022-12-21 MED ORDER — ADULT MULTIVITAMIN W/MINERALS CH
1.0000 | ORAL_TABLET | Freq: Every day | ORAL | Status: DC
  Administered 2022-12-21 – 2022-12-23 (×3): 1 via ORAL
  Filled 2022-12-21 (×3): qty 1

## 2022-12-21 MED ORDER — MORPHINE SULFATE (PF) 2 MG/ML IV SOLN
2.0000 mg | INTRAVENOUS | Status: DC | PRN
  Administered 2022-12-21 – 2022-12-23 (×9): 2 mg via INTRAVENOUS
  Filled 2022-12-21 (×9): qty 1

## 2022-12-21 MED ORDER — FOLIC ACID 1 MG PO TABS
1.0000 mg | ORAL_TABLET | Freq: Every day | ORAL | Status: DC
  Administered 2022-12-21 – 2022-12-23 (×3): 1 mg via ORAL
  Filled 2022-12-21 (×3): qty 1

## 2022-12-21 MED ORDER — SPIRONOLACTONE 25 MG PO TABS
25.0000 mg | ORAL_TABLET | Freq: Two times a day (BID) | ORAL | Status: DC
  Administered 2022-12-21 – 2022-12-22 (×4): 25 mg via ORAL
  Filled 2022-12-21 (×4): qty 1

## 2022-12-21 MED ORDER — MAGNESIUM SULFATE 2 GM/50ML IV SOLN
2.0000 g | Freq: Once | INTRAVENOUS | Status: AC
  Administered 2022-12-21: 2 g via INTRAVENOUS
  Filled 2022-12-21: qty 50

## 2022-12-21 MED ORDER — ENSURE ENLIVE PO LIQD
237.0000 mL | Freq: Two times a day (BID) | ORAL | Status: DC
  Administered 2022-12-21 – 2022-12-23 (×4): 237 mL via ORAL

## 2022-12-21 MED ORDER — ALBUMIN HUMAN 25 % IV SOLN
50.0000 g | Freq: Once | INTRAVENOUS | Status: AC
  Administered 2022-12-21: 50 g via INTRAVENOUS
  Filled 2022-12-21: qty 200

## 2022-12-21 MED ORDER — PANTOPRAZOLE SODIUM 40 MG IV SOLR
40.0000 mg | Freq: Once | INTRAVENOUS | Status: AC
  Administered 2022-12-21: 40 mg via INTRAVENOUS
  Filled 2022-12-21: qty 10

## 2022-12-21 MED ORDER — RIFAXIMIN 550 MG PO TABS
550.0000 mg | ORAL_TABLET | Freq: Two times a day (BID) | ORAL | Status: DC
  Administered 2022-12-21 – 2022-12-23 (×6): 550 mg via ORAL
  Filled 2022-12-21 (×6): qty 1

## 2022-12-21 MED ORDER — PANTOPRAZOLE SODIUM 40 MG PO TBEC
40.0000 mg | DELAYED_RELEASE_TABLET | Freq: Two times a day (BID) | ORAL | Status: DC
  Administered 2022-12-21 – 2022-12-23 (×6): 40 mg via ORAL
  Filled 2022-12-21 (×6): qty 1

## 2022-12-21 MED ORDER — THIAMINE MONONITRATE 100 MG PO TABS
100.0000 mg | ORAL_TABLET | Freq: Every day | ORAL | Status: DC
  Administered 2022-12-21 – 2022-12-23 (×3): 100 mg via ORAL
  Filled 2022-12-21 (×3): qty 1

## 2022-12-21 MED ORDER — URSODIOL 300 MG PO CAPS
300.0000 mg | ORAL_CAPSULE | Freq: Three times a day (TID) | ORAL | Status: DC
  Administered 2022-12-21 – 2022-12-23 (×6): 300 mg via ORAL
  Filled 2022-12-21 (×14): qty 1

## 2022-12-21 MED ORDER — ONDANSETRON HCL 4 MG/2ML IJ SOLN
4.0000 mg | Freq: Four times a day (QID) | INTRAMUSCULAR | Status: DC
  Administered 2022-12-21 – 2022-12-22 (×4): 4 mg via INTRAVENOUS
  Filled 2022-12-21 (×4): qty 2

## 2022-12-21 MED ORDER — AZITHROMYCIN 250 MG PO TABS
500.0000 mg | ORAL_TABLET | Freq: Every day | ORAL | Status: DC
  Administered 2022-12-21 – 2022-12-23 (×3): 500 mg via ORAL
  Filled 2022-12-21 (×3): qty 2

## 2022-12-21 MED ORDER — NICOTINE 14 MG/24HR TD PT24
14.0000 mg | MEDICATED_PATCH | Freq: Every day | TRANSDERMAL | Status: DC
  Administered 2022-12-21 – 2022-12-23 (×4): 14 mg via TRANSDERMAL
  Filled 2022-12-21 (×4): qty 1

## 2022-12-21 MED ORDER — POTASSIUM CHLORIDE CRYS ER 20 MEQ PO TBCR
40.0000 meq | EXTENDED_RELEASE_TABLET | Freq: Once | ORAL | Status: AC
  Administered 2022-12-21: 40 meq via ORAL
  Filled 2022-12-21: qty 2

## 2022-12-21 MED ORDER — LACTULOSE 10 GM/15ML PO SOLN
20.0000 g | Freq: Three times a day (TID) | ORAL | Status: DC
  Administered 2022-12-21 – 2022-12-23 (×7): 20 g via ORAL
  Filled 2022-12-21 (×7): qty 30

## 2022-12-21 MED ORDER — MELATONIN 3 MG PO TABS
6.0000 mg | ORAL_TABLET | Freq: Every day | ORAL | Status: DC
  Administered 2022-12-21 – 2022-12-22 (×2): 6 mg via ORAL
  Filled 2022-12-21 (×2): qty 2

## 2022-12-21 NOTE — Progress Notes (Addendum)
PROGRESS NOTE    Patient: Mallory Mendoza                            PCP: Carrolyn Meiers, MD                    DOB: 01/17/1981            DOA: 12/20/2022 [id number]             DOS: 12/21/2022, 12:06 PM   LOS: 1 day   Date of Service: The patient was seen and examined on 12/21/2022  Subjective:   The patient was seen and examined this morning. Hemodynamically stable. No issues overnight . Status post paracentesis -.  Yielding 2.8 L of bright yellow fluid extraction  Brief Narrative:    Mallory Mendoza is a 42 y.o. female with medical history significant of history of alcohol abuse, chronic cirrhosis, GERD, tobacco abuse who presents to the emergency department due to 1 week onset of increasing leg swelling and abdominal distention, this worsened today with increased shortness of breath than baseline.  She states that she has never had paracentesis and denies fever, chills, chest pain, nausea, vomiting, abdominal pain. Patient was recently admitted from 1/19 to 1/22 due to anasarca, hypokalemia which was treated with IV diuresis and potassium was replenished   ED Course:  RR 22/min, pulse 103 bpm, BP 104/75, O2 sat was 100% on room air.  Workup in the ED showed hemoglobin of 8.4, hematocrit 26.0, WBC 10.88, platelets 277, ammonia 20, BMP sodium 134, potassium 3.0, chloride 103, bicarb 21, blood glucose 118, BUN 13, creatinine 1.06, albumin 2.7, AST 37, ALT 16, ALP 117, total bilirubin 3.4, BNP 169. Chest x-ray - showed possible small left apical pneumothorax and persistent hazy opacity in left upper lobe which could represent an evolving infection. CT angiogram of chest with contrast showed: 1. Consolidation in the left lingula with patchy airspace infiltrates throughout both lungs, likely multifocal pneumonia. Small left pleural effusion. 2. Probable hepatic cirrhosis with improved fatty infiltration of the liver since prior study. Diffuse abdominal and pelvic ascites. Upper  abdominal varices. Ascites has progressed since prior study. 3. Aortic atherosclerosis. IV Lasix 40 mg x 1 was given, Norco was given, potassium was replenished, magnesium oxide 800 mg x 1 and IV albumin 25 g x 1 were given.   Hospitalist was asked to admit patient for further evaluation and management.   Assessment/Plan   Principal Problem:   Alcoholic cirrhosis of liver with ascites (HCC) Active Problems:   Tobacco use disorder   Hyperbilirubinemia   Hypokalemia   GERD (gastroesophageal reflux disease)   CAP (community acquired pneumonia)   Anasarca   Elevated brain natriuretic peptide (BNP) level   Abdominal pain  Alcoholic cirrhosis of liver with ascites / Anasarca/ Hypoalbuminemia - Monitoring - stable  - IV Lasix 40 mg x 1 was given - Continue Lasix and spironolactone - Albumin 2.7; IV albumin 25 g x 1 was given in the ED Continue rifaximin, lactulose, patient has since abstained from alcohol use - Continue folic acid,  thiamine and multivitamin  - Paracentesis was ordered >>> yielding 2.8 L of clear yellow fluid [place]   CAP POA -Improved cough and shortness of breath, currently satting 97% on room air, WBC of 8.3, - CT of the chest was suggestive of multifocal pneumonia - Patient was started on ceftriaxone and azithromycin, will continue  - will  F/up w  Blood culture, sputum culture, urine Legionella, strep pneumo and procalcitonin - Continue Mucinex, incentive spirometry, flutter valve    Elevated BNP rule out acute CHF Patient complained of leg edema, abdominal distention which was possibly due to cirrhosis.   - we will rule out CHF - Continue total input/output, daily weights and fluid restriction - Continue IV Lasix 40 mg twice daily - Continue Cardiac diet             - Echocardiogram >>>    Abdominal pain -Improved post paracentesis Continue IV morphine 2 mg every 4 hours as needed for moderate/severe pain Continue Protonix    Hypokalemia/hypomagnesemia Potassium 3.0 >> 2.7  replenished   GERD Continue Protonix   Tobacco use disorder Patient was counseled on tobacco abuse cessation Continue nicotine patch  Anemia of chronic disease -Likely related to chronic liver cirrhosis -Hemoglobin 8.4, 7.6 today -Obtaining iron studies -Will transfuse if symptomatic, hypotensive, or hemoglobin < 7.0      ------------------------------------------------------------------------------------------------------------------------ Nutritional status:  The patient's BMI is: Body mass index is 24.45 kg/m. I agree with the assessment and plan as outlined ------------------------------------------------------------------------------------------------------------------------- Cultures; Blood Cultures x 2 >> NGT Sputum Culture >> NGT    ------------------------------------------------------------------------------------------------------------------------------------------------  DVT prophylaxis:  SCDs Start: 12/20/22 2347   Code Status:   Code Status: Full Code  Family Communication: No family member present at bedside- attempt will be made to update daily The above findings and plan of care has been discussed with patient (and family)  in detail,  they expressed understanding and agreement of above. -Advance care planning has been discussed.   Admission status:   Status is: Inpatient Remains inpatient appropriate because: Procedures such as paracentesis, diuretics treating underlying liver cirrhosis anasarca   Disposition: From  - home             Planning for discharge in 1-2 days: to home  Procedures:   No admission procedures for hospital encounter.   Antimicrobials:  Anti-infectives (From admission, onward)    Start     Dose/Rate Route Frequency Ordered Stop   12/21/22 2200  cefTRIAXone (ROCEPHIN) 1 g in sodium chloride 0.9 % 100 mL IVPB        1 g 200 mL/hr over 30 Minutes Intravenous Every 24  hours 12/20/22 2340 12/25/22 2159   12/21/22 1300  azithromycin (ZITHROMAX) tablet 500 mg        500 mg Oral Daily 12/21/22 1203     12/21/22 0015  rifaximin (XIFAXAN) tablet 550 mg        550 mg Oral 2 times daily 12/21/22 0006     12/20/22 2115  cefTRIAXone (ROCEPHIN) 2 g in sodium chloride 0.9 % 100 mL IVPB        2 g 200 mL/hr over 30 Minutes Intravenous  Once 12/20/22 2104 12/20/22 2357   12/20/22 2115  azithromycin (ZITHROMAX) 500 mg in sodium chloride 0.9 % 250 mL IVPB  Status:  Discontinued        500 mg 250 mL/hr over 60 Minutes Intravenous Every 24 hours 12/20/22 2104 12/21/22 1203        Medication:   azithromycin  500 mg Oral Daily   dextromethorphan-guaiFENesin  1 tablet Oral BID   feeding supplement  237 mL Oral BID BM   folic acid  1 mg Oral Daily   furosemide  40 mg Intravenous Q12H   lactulose  20 g Oral TID   multivitamin with minerals  1 tablet Oral Daily  nicotine  14 mg Transdermal Daily   pantoprazole  40 mg Oral BID   rifaximin  550 mg Oral BID   spironolactone  25 mg Oral BID   thiamine  100 mg Oral Daily    morphine injection, ondansetron **OR** ondansetron (ZOFRAN) IV   Objective:   Vitals:   12/21/22 0931 12/21/22 0940 12/21/22 0948 12/21/22 1201  BP: 107/77 106/73 103/73 115/64  Pulse: (!) 110 (!) 107 (!) 108 (!) 108  Resp: '18 18 18 18  '$ Temp:    98.2 F (36.8 C)  TempSrc:    Oral  SpO2: 100% 97% 97% 100%  Weight:      Height:        Intake/Output Summary (Last 24 hours) at 12/21/2022 1206 Last data filed at 12/21/2022 0900 Gross per 24 hour  Intake 410 ml  Output --  Net 410 ml   Filed Weights   12/21/22 0356  Weight: 64.6 kg     Physical examination:   Constitution:  Alert, cooperative, no distress,  Appears calm and comfortable  Psychiatric:   Normal and stable mood and affect, cognition intact,   HEENT:        Normocephalic, PERRL, otherwise with in Normal limits  Chest:         Chest symmetric Cardio vascular:  S1/S2,  RRR, No murmure, No Rubs or Gallops  pulmonary: Clear to auscultation bilaterally, respirations unlabored, negative wheezes / crackles Abdomen: Soft, positive for fluid shift, mild distention - bowel sounds,no masses, no organomegaly Muscular skeletal: Limited exam - in bed, able to move all 4 extremities,   Neuro: CNII-XII intact. , normal motor and sensation, reflexes intact  Extremities: No pitting edema lower extremities, +2 pulses  Skin: Dry, warm to touch, negative for any Rashes, No open wounds Wounds: per nursing documentation   ------------------------------------------------------------------------------------------------------------------------------------------    LABs:     Latest Ref Rng & Units 12/21/2022    4:28 AM 12/20/2022    6:01 PM 12/05/2022    4:49 AM  CBC  WBC 4.0 - 10.5 K/uL 8.3  10.3  19.0   Hemoglobin 12.0 - 15.0 g/dL 7.6  8.4  7.9   Hematocrit 36.0 - 46.0 % 23.8  26.0  24.7   Platelets 150 - 400 K/uL 280  277  243       Latest Ref Rng & Units 12/21/2022    4:28 AM 12/20/2022    6:01 PM 12/06/2022    8:49 AM  CMP  Glucose 70 - 99 mg/dL 84  118  92   BUN 6 - 20 mg/dL '13  13  9   '$ Creatinine 0.44 - 1.00 mg/dL 1.05  1.06  0.56   Sodium 135 - 145 mmol/L 136  134  135   Potassium 3.5 - 5.1 mmol/L 2.7  3.0  3.6   Chloride 98 - 111 mmol/L 105  103  95   CO2 22 - 32 mmol/L '21  21  29   '$ Calcium 8.9 - 10.3 mg/dL 8.7  8.7  9.6   Total Protein 6.5 - 8.1 g/dL 6.4  7.0  7.5   Total Bilirubin 0.3 - 1.2 mg/dL 2.9  3.4  4.3   Alkaline Phos 38 - 126 U/L 86  112  108   AST 15 - 41 U/L 31  37  89   ALT 0 - 44 U/L '15  16  29        '$ Micro Results No results found for this or any  previous visit (from the past 240 hour(s)).  Radiology Reports US Paracentesis  Result Date: 12/21/2022 Lavonia Dana, MD     12/21/2022 11:25 AM PreOperative Dx: Biliary cirrhosis, ascites Postoperative Dx: Biliary cirrhosis, ascites Procedure:   US guided paracentesis Radiologist:  Thornton Papas  Anesthesia:  10 ml of1% lidocaine Specimen:  2.8 L of bright yellow ascitic fluid EBL:   < 1 ml Complications:  None   CT CHEST ABDOMEN PELVIS W CONTRAST  Result Date: 12/20/2022 CLINICAL DATA:  Epigastric pain and shortness of breath since this morning. EXAM: CT CHEST, ABDOMEN, AND PELVIS WITH CONTRAST TECHNIQUE: Multidetector CT imaging of the chest, abdomen and pelvis was performed following the standard protocol during bolus administration of intravenous contrast. RADIATION DOSE REDUCTION: This exam was performed according to the departmental dose-optimization program which includes automated exposure control, adjustment of the mA and/or kV according to patient size and/or use of iterative reconstruction technique. CONTRAST:  17m OMNIPAQUE IOHEXOL 300 MG/ML  SOLN COMPARISON:  CT abdomen and pelvis 10/30/2022 FINDINGS: CT CHEST FINDINGS Cardiovascular: Normal heart size. No pericardial effusions. Normal caliber thoracic aorta. No dissection. Calcifications in the origin of the left subclavian artery. Mediastinum/Nodes: Esophagus is decompressed. No significant lymphadenopathy. Thyroid gland is unremarkable. Lungs/Pleura: Dense consolidation in the left lingula with patchy areas of ground-glass airspace disease scattered throughout both lungs. Changes are most likely to represent multifocal pneumonia. Follow-up to resolution is recommended to exclude underlying nodules. Small left pleural effusion with fluid in the left major fissure. Musculoskeletal: No chest wall mass or suspicious bone lesions identified. CT ABDOMEN PELVIS FINDINGS Hepatobiliary: Fatty infiltration in the liver has improved since the prior study. No focal lesions. Gallbladder is decompressed with gallbladder wall thickening, likely related to liver disease. No bile duct dilatation. Portal veins are patent. Pancreas: Unremarkable. No pancreatic ductal dilatation or surrounding inflammatory changes. Spleen: Spleen is enlarged.  Adrenals/Urinary Tract: Adrenal glands are unremarkable. Kidneys are normal, without renal calculi, focal lesion, or hydronephrosis. Bladder is unremarkable. Stomach/Bowel: Stomach, small bowel, and colon are not abnormally distended. No wall thickening or inflammatory changes. Appendix is normal. Vascular/Lymphatic: Calcification of the aorta without aneurysm. No significant lymphadenopathy. Upper abdominal varices. Periumbilical vein varices. Reproductive: Uterus and bilateral adnexa are unremarkable. Other: Moderate free fluid throughout the abdomen and pelvis, likely ascites. No free air. Edema in the subcutaneous fat. Musculoskeletal: No acute or significant osseous findings. IMPRESSION: 1. Consolidation in the left lingula with patchy airspace infiltrates throughout both lungs, likely multifocal pneumonia. Small left pleural effusion. 2. Probable hepatic cirrhosis with improved fatty infiltration of the liver since prior study. Diffuse abdominal and pelvic ascites. Upper abdominal varices. Ascites has progressed since prior study. 3. Aortic atherosclerosis. Electronically Signed   By: WLucienne CapersM.D.   On: 12/20/2022 20:59   DG Chest 2 View  Result Date: 12/20/2022 CLINICAL DATA:  Dyspnea EXAM: CHEST - 2 VIEW COMPARISON:  CXR 12/05/22 FINDINGS: Possible small left apical pneumothorax. No pleural effusion. No pneumothorax. There is a persistent hazy opacity in the left upper lobe. Normal cardiac and mediastinal contours. No radiographically apparent displaced rib fractures. Visualized upper abdomen is unremarkable. Vertebral body heights are maintained. IMPRESSION: 1. Possible small left apical pneumothorax. 2. Persistent hazy opacity in the left upper lobe, which could represent an evolving infection. Electronically Signed   By: HMarin RobertsM.D.   On: 12/20/2022 19:27    SIGNED: SDeatra James MD, FHM. FAAFP. MZacarias Pontes- [facility] Time spent > 35 min.  In seeing, evaluating  and  examining the patient. Reviewing medical records, labs, drawn plan of care. [facility],  Pager (please use [email or web address] to page/ text) Please use Epic Secure Chat for non-urgent communication (7AM-7PM)  If 7PM-7AM, please contact night-coverage [email or web address], 12/21/2022, 12:06 PM

## 2022-12-21 NOTE — Progress Notes (Signed)
Noted am HGB 7.6, was 8.4 on admit.  Reviewed with provider, no new orders at this time.

## 2022-12-21 NOTE — Procedures (Signed)
PreOperative Dx: Biliary cirrhosis, ascites Postoperative Dx: Biliary cirrhosis, ascites Procedure:   US guided paracentesis Radiologist:  Thornton Papas Anesthesia:  10 ml of1% lidocaine Specimen:  2.8 L of bright yellow ascitic fluid EBL:   < 1 ml Complications:  None
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
## 2022-12-21 NOTE — Hospital Course (Addendum)
Mallory Mendoza is a 42 y.o. female with medical history significant of history of alcohol abuse, chronic cirrhosis, GERD, tobacco abuse who presents to the emergency department due to 1 week onset of increasing leg swelling and abdominal distention, this worsened today with increased shortness of breath than baseline.  She states that she has never had paracentesis and denies fever, chills, chest pain, nausea, vomiting, abdominal pain. Patient was recently admitted from 1/19 to 1/22 due to anasarca, hypokalemia which was treated with IV diuresis and potassium was replenished   ED Course:  RR 22/min, pulse 103 bpm, BP 104/75, O2 sat was 100% on room air.  Workup in the ED showed hemoglobin of 8.4, hematocrit 26.0, WBC 10.88, platelets 277, ammonia 20, BMP sodium 134, potassium 3.0, chloride 103, bicarb 21, blood glucose 118, BUN 13, creatinine 1.06, albumin 2.7, AST 37, ALT 16, ALP 117, total bilirubin 3.4, BNP 169. Chest x-ray - showed possible small left apical pneumothorax and persistent hazy opacity in left upper lobe which could represent an evolving infection. CT angiogram of chest with contrast showed: 1. Consolidation in the left lingula with patchy airspace infiltrates throughout both lungs, likely multifocal pneumonia. Small left pleural effusion. 2. Probable hepatic cirrhosis with improved fatty infiltration of the liver since prior study. Diffuse abdominal and pelvic ascites. Upper abdominal varices. Ascites has progressed since prior study. 3. Aortic atherosclerosis. IV Lasix 40 mg x 1 was given, Norco was given, potassium was replenished, magnesium oxide 800 mg x 1 and IV albumin 25 g x 1 were given.   Hospitalist was asked to admit patient for further evaluation and management.   Assessment/Plan   Principal Problem:   Alcoholic cirrhosis of liver with ascites (HCC) Active Problems:   Tobacco use disorder   Hyperbilirubinemia   Hypokalemia   GERD (gastroesophageal reflux  disease)   CAP (community acquired pneumonia)   Anasarca   Elevated brain natriuretic peptide (BNP) level   Abdominal pain  Alcoholic cirrhosis of liver with ascites / Anasarca/ Hypoalbuminemia -Stable, mild tenderness at the site of paracentesis, improved distention -  Continue IV Lasix and spironolactone - Albumin 2.7; IV albumin  Continue rifaximin, lactulose, patient has since abstained from alcohol use - Continue folic acid,  thiamine and multivitamin  - Paracentesis was ordered >>> yielding 2.8 L of clear yellow fluid     CAP POA -Improved shortness of breath, cough - CT of the chest was suggestive of multifocal pneumonia - Patient was started on ceftriaxone and azithromycin, will continue  - will F/up w  Blood culture, sputum culture, urine Legionella, strep pneumo  - Procalcitonin 0.22 - Continue Mucinex, incentive spirometry, flutter valve    Elevated BNP rule out acute CHF -Elevated BNP, edema-has improved likely exacerbation due to cirrhosis -  Ruled out CHF - Continue total input/output, daily weights and fluid restriction - Continue IV Lasix 40 mg twice daily - Continue Cardiac diet             - Echocardiogram >>> FINDINGS   Left Ventricle: Left ventricular ejection fraction, by estimation, is 65  to 70%. The left ventricle has normal function. The left ventricle has no  regional wall motion abnormalities. The left ventricular internal cavity  size was normal in size. There is   no left ventricular hypertroph    Abdominal pain -Improved post paracentesis Continue IV morphine 2 mg every 4 hours as needed for moderate/severe pain Continue Protonix   Hypokalemia/hypomagnesemia Potassium 3.0 >> 2.7  replenished  GERD Continue Protonix   Tobacco use disorder Patient was counseled on tobacco abuse cessation Continue nicotine patch  Anemia of chronic disease -Likely related to chronic liver cirrhosis -Hemoglobin 8.4, 7.6 today    Latest Ref Rng & Units  12/22/2022    8:24 AM 12/22/2022    4:38 AM 12/21/2022    4:28 AM  CBC  WBC 4.0 - 10.5 K/uL  8.1  8.3   Hemoglobin 12.0 - 15.0 g/dL 7.2  7.1  7.6   Hematocrit 36.0 - 46.0 % 22.4  22.3  23.8   Platelets 150 - 400 K/uL  252  280       -Obtaining iron studies -Will transfuse if symptomatic, hypotensive, or hemoglobin < 7.0
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
## 2022-12-21 NOTE — Progress Notes (Signed)
Pt NPO.  ABD distended, taut.  Pt reports passing gas and BM yesterday.  Appetite decreased at home at times due to distention.  Morphine given for comfort.  Resting in room.

## 2022-12-21 NOTE — Progress Notes (Signed)
Patient tolerated right sided paracentesis procedure well today and 2.8 Liters of clear yellow ascites removed with labs collected and sent for processing. PT verbalized understanding of post procedure instructions and patient transported back to inpatient bed assignment at this time via wheelchair with no acute distress noted.

## 2022-12-21 NOTE — Progress Notes (Signed)
  Echocardiogram 2D Echocardiogram has been performed.  Mallory Mendoza 12/21/2022, 11:14 AM

## 2022-12-22 ENCOUNTER — Inpatient Hospital Stay (HOSPITAL_COMMUNITY): Payer: Medicaid Other

## 2022-12-22 DIAGNOSIS — E876 Hypokalemia: Secondary | ICD-10-CM | POA: Diagnosis not present

## 2022-12-22 DIAGNOSIS — D649 Anemia, unspecified: Secondary | ICD-10-CM

## 2022-12-22 DIAGNOSIS — K7031 Alcoholic cirrhosis of liver with ascites: Secondary | ICD-10-CM | POA: Diagnosis not present

## 2022-12-22 LAB — COMPREHENSIVE METABOLIC PANEL
ALT: 15 U/L (ref 0–44)
AST: 31 U/L (ref 15–41)
Albumin: 3.1 g/dL — ABNORMAL LOW (ref 3.5–5.0)
Alkaline Phosphatase: 81 U/L (ref 38–126)
Anion gap: 10 (ref 5–15)
BUN: 9 mg/dL (ref 6–20)
CO2: 23 mmol/L (ref 22–32)
Calcium: 9 mg/dL (ref 8.9–10.3)
Chloride: 104 mmol/L (ref 98–111)
Creatinine, Ser: 0.77 mg/dL (ref 0.44–1.00)
GFR, Estimated: >60 mL/min (ref >60)
Glucose, Bld: 78 mg/dL (ref 70–99)
Potassium: 2.8 mmol/L — ABNORMAL LOW (ref 3.5–5.1)
Sodium: 137 mmol/L (ref 135–145)
Total Bilirubin: 2.8 mg/dL — ABNORMAL HIGH (ref 0.3–1.2)
Total Protein: 6.4 g/dL — ABNORMAL LOW (ref 6.5–8.1)

## 2022-12-22 LAB — HEMOGLOBIN AND HEMATOCRIT, BLOOD
HCT: 22.4 % — ABNORMAL LOW (ref 36.0–46.0)
Hemoglobin: 7.2 g/dL — ABNORMAL LOW (ref 12.0–15.0)

## 2022-12-22 LAB — CBC
HCT: 22.3 % — ABNORMAL LOW (ref 36.0–46.0)
Hemoglobin: 7.1 g/dL — ABNORMAL LOW (ref 12.0–15.0)
MCH: 32.1 pg (ref 26.0–34.0)
MCHC: 31.8 g/dL (ref 30.0–36.0)
MCV: 100.9 fL — ABNORMAL HIGH (ref 80.0–100.0)
Platelets: 252 10*3/uL (ref 150–400)
RBC: 2.21 MIL/uL — ABNORMAL LOW (ref 3.87–5.11)
RDW: 18.8 % — ABNORMAL HIGH (ref 11.5–15.5)
WBC: 8.1 10*3/uL (ref 4.0–10.5)
nRBC: 0 % (ref 0.0–0.2)

## 2022-12-22 LAB — PROTIME-INR
INR: 1.7 — ABNORMAL HIGH (ref 0.8–1.2)
Prothrombin Time: 19.6 seconds — ABNORMAL HIGH (ref 11.4–15.2)

## 2022-12-22 LAB — AMMONIA: Ammonia: 41 umol/L — ABNORMAL HIGH (ref 9–35)

## 2022-12-22 MED ORDER — FUROSEMIDE 10 MG/ML IJ SOLN
40.0000 mg | Freq: Every day | INTRAMUSCULAR | Status: DC
  Administered 2022-12-23: 40 mg via INTRAVENOUS
  Filled 2022-12-22: qty 4

## 2022-12-22 MED ORDER — SPIRONOLACTONE 100 MG PO TABS
100.0000 mg | ORAL_TABLET | Freq: Every day | ORAL | Status: DC
  Administered 2022-12-23: 100 mg via ORAL
  Filled 2022-12-22: qty 1

## 2022-12-22 MED ORDER — MAGNESIUM SULFATE 2 GM/50ML IV SOLN: 2.0000 g | Freq: Once | INTRAVENOUS | Status: DC

## 2022-12-22 MED ORDER — MAGNESIUM SULFATE 2 GM/50ML IV SOLN
2.0000 g | Freq: Once | INTRAVENOUS | Status: AC
  Administered 2022-12-22: 2 g via INTRAVENOUS
  Filled 2022-12-22: qty 50

## 2022-12-22 MED ORDER — HYDROXYZINE HCL 10 MG PO TABS
10.0000 mg | ORAL_TABLET | Freq: Four times a day (QID) | ORAL | Status: DC | PRN
  Administered 2022-12-22 – 2022-12-23 (×2): 10 mg via ORAL
  Filled 2022-12-22 (×2): qty 1

## 2022-12-22 MED ORDER — SPIRONOLACTONE 25 MG PO TABS
50.0000 mg | ORAL_TABLET | Freq: Once | ORAL | Status: AC
  Administered 2022-12-22: 50 mg via ORAL
  Filled 2022-12-22: qty 2

## 2022-12-22 NOTE — Progress Notes (Signed)
Subjective: Feeling better today. Abdomen is still a little distended, but not tight. No abdominal pain, nausea, or vomiting.  Thinks she had vomiting yesterday due to eating too soon after paracentesis.  No prior nausea or vomiting and feeling well this morning.  Bowels moving well. At least 4 Bms per day. No confusion. No brbpr or melena. Reports she was taking Lasix 20 mg BID and aldactone 25 mg BID at home. Was eating fast food frequently.   Objective: Vital signs in last 24 hours: Temp:  [98.2 F (36.8 C)-99.5 F (37.5 C)] 98.8 F (37.1 C) (02/06 0441) Pulse Rate:  [107-115] 115 (02/06 0441) Resp:  [18] 18 (02/06 0441) BP: (103-120)/(64-73) 113/64 (02/06 0441) SpO2:  [97 %-100 %] 100 % (02/06 0441) Weight:  [58.9 kg] 58.9 kg (02/06 0445) Last BM Date : 12/20/22 General:   Alert and oriented, pleasant, NAD.  Head:  Normocephalic and atraumatic. Eyes: + scleral icterus Abdomen:  Bowel sounds present, soft, non-tender. Mildly distended/full, but non-tense. No masses appreciated.  Msk:  Symmetrical without gross deformities. Normal posture. Extremities:  Without edema. Neurologic:  Alert and  oriented x4;  grossly normal neurologically. Skin:  Warm and dry, intact without significant lesions.  Cervical Nodes:  No significant cervical adenopathy. Psych: Normal mood and affect.  Intake/Output from previous day: 02/05 0701 - 02/06 0700 In: 240 [P.O.:240] Out: -  Intake/Output this shift: No intake/output data recorded.  Lab Results: Recent Labs    12/20/22 1801 12/21/22 0428 12/22/22 0438 12/22/22 0824  WBC 10.3 8.3 8.1  --   HGB 8.4* 7.6* 7.1* 7.2*  HCT 26.0* 23.8* 22.3* 22.4*  PLT 277 280 252  --    BMET Recent Labs    12/20/22 1801 12/21/22 0428 12/22/22 0438  NA 134* 136 137  K 3.0* 2.7* 2.8*  CL 103 105 104  CO2 21* 21* 23  GLUCOSE 118* 84 78  BUN '13 13 9  '$ CREATININE 1.06* 1.05* 0.77  CALCIUM 8.7* 8.7* 9.0   LFT Recent Labs    12/20/22 1801  12/21/22 0428 12/22/22 0438  PROT 7.0 6.4* 6.4*  ALBUMIN 2.7* 2.7* 3.1*  AST 37 31 31  ALT '16 15 15  '$ ALKPHOS 112 86 81  BILITOT 3.4* 2.9* 2.8*  BILIDIR  --  1.6*  --    PT/INR Recent Labs    12/21/22 1332 12/22/22 0438  LABPROT 20.3* 19.6*  INR 1.8* 1.7*    Studies/Results: ECHOCARDIOGRAM COMPLETE  Result Date: 12/21/2022    ECHOCARDIOGRAM REPORT   Patient Name:   AMYIAH GABA Date of Exam: 12/21/2022 Medical Rec #:  [id number]       Height:       64.0 in Accession #:    [id number]      Weight:       142.4 lb Date of Birth:  May 24, 1981       BSA:          1.693 m Patient Age:    42 years        BP:           103/73 mmHg Patient Gender: F               HR:           101 bpm. Exam Location:  [facility] Procedure: 2D Echo, Cardiac Doppler, Color Doppler and 3D Echo Indications:    CHF-Acute Diastolic H41.93  History:        Patient has no  prior history of Echocardiogram examinations.                 Signs/Symptoms:Shortness of Breath; Risk Factors:Current Smoker.  Sonographer:    Greer Pickerel Referring Phys: [id number] OLADAPO ADEFESO  Sonographer Comments: Image acquisition challenging due to patient body habitus. Global longitudinal strain was attempted. IMPRESSIONS  1. Left ventricular ejection fraction, by estimation, is 65 to 70%. The left ventricle has normal function. The left ventricle has no regional wall motion abnormalities. Left ventricular diastolic parameters were normal.  2. Right ventricular systolic function is normal. The right ventricular size is normal. Tricuspid regurgitation signal is inadequate for assessing PA pressure.  3. Left atrial size was mildly dilated.  4. The mitral valve is normal in structure. No evidence of mitral valve regurgitation. No evidence of mitral stenosis.  5. The tricuspid valve is abnormal.  6. The aortic valve is tricuspid. Aortic valve regurgitation is not visualized. No aortic stenosis is present. FINDINGS  Left Ventricle: Left ventricular ejection  fraction, by estimation, is 65 to 70%. The left ventricle has normal function. The left ventricle has no regional wall motion abnormalities. The left ventricular internal cavity size was normal in size. There is  no left ventricular hypertrophy. Left ventricular diastolic parameters were normal. Right Ventricle: The right ventricular size is normal. Right vetricular wall thickness was not well visualized. Right ventricular systolic function is normal. Tricuspid regurgitation signal is inadequate for assessing PA pressure. Left Atrium: Left atrial size was mildly dilated. Right Atrium: Right atrial size was normal in size. Pericardium: There is no evidence of pericardial effusion. Mitral Valve: The mitral valve is normal in structure. No evidence of mitral valve regurgitation. No evidence of mitral valve stenosis. Tricuspid Valve: The tricuspid valve is abnormal. Tricuspid valve regurgitation is mild . No evidence of tricuspid stenosis. Aortic Valve: The aortic valve is tricuspid. Aortic valve regurgitation is not visualized. No aortic stenosis is present. Aortic valve mean gradient measures 9.6 mmHg. Aortic valve peak gradient measures 21.6 mmHg. Aortic valve area, by VTI measures 1.74  cm. Pulmonic Valve: The pulmonic valve was not well visualized. Pulmonic valve regurgitation is not visualized. No evidence of pulmonic stenosis. Aorta: The aortic root is normal in size and structure. IAS/Shunts: No atrial level shunt detected by color flow Doppler.  LEFT VENTRICLE PLAX 2D LVIDd:         4.30 cm   Diastology LVIDs:         2.50 cm   LV e' medial:    11.00 cm/s LV PW:         0.90 cm   LV E/e' medial:  11.5 LV IVS:        0.70 cm   LV e' lateral:   13.40 cm/s LVOT diam:     1.90 cm   LV E/e' lateral: 9.5 LV SV:         60 LV SV Index:   36 LVOT Area:     2.84 cm                           3D Volume EF:                          3D EF:        60 %  LV EDV:       125 ml                           LV ESV:       50 ml                          LV SV:        75 ml RIGHT VENTRICLE RV S prime:     16.70 cm/s TAPSE (M-mode): 2.4 cm LEFT ATRIUM             Index        RIGHT ATRIUM           Index LA diam:        4.00 cm 2.36 cm/m   RA Area:     18.60 cm LA Vol (A2C):   73.6 ml 43.46 ml/m  RA Volume:   52.30 ml  30.88 ml/m LA Vol (A4C):   51.9 ml 30.65 ml/m LA Biplane Vol: 61.8 ml 36.49 ml/m  AORTIC VALVE AV Area (Vmax):    1.51 cm AV Area (Vmean):   1.60 cm AV Area (VTI):     1.74 cm AV Vmax:           232.38 cm/s AV Vmean:          144.377 cm/s AV VTI:            0.347 m AV Peak Grad:      21.6 mmHg AV Mean Grad:      9.6 mmHg LVOT Vmax:         124.00 cm/s LVOT Vmean:        81.500 cm/s LVOT VTI:          0.213 m LVOT/AV VTI ratio: 0.61  AORTA Ao Root diam: 3.10 cm Ao Asc diam:  3.10 cm MITRAL VALVE MV Area (PHT): 4.86 cm     SHUNTS MV Decel Time: 156 msec     Systemic VTI:  0.21 m MV E velocity: 127.00 cm/s  Systemic Diam: 1.90 cm MV A velocity: 115.00 cm/s MV E/A ratio:  1.10 Carlyle Dolly MD Electronically signed by Carlyle Dolly MD Signature Date/Time: 12/21/2022/12:30:57 PM    Final    US Paracentesis  Result Date: 12/21/2022 INDICATION: Biliary cirrhosis, ascites EXAM: ULTRASOUND GUIDED DIAGNOSTIC AND THERAPEUTIC PARACENTESIS MEDICATIONS: None. COMPLICATIONS: None immediate. PROCEDURE: Informed written consent was obtained from the patient after a discussion of the risks, benefits and alternatives to treatment. A timeout was performed prior to the initiation of the procedure. Initial ultrasound scanning demonstrates a moderate amount of ascites within the right lower abdominal quadrant. The right lower abdomen was prepped and draped in the usual sterile fashion. 1% lidocaine was used for local anesthesia. Following this, a 19 gauge, 7-cm, Yueh catheter was introduced. An ultrasound image was saved for documentation purposes. The paracentesis was performed. The catheter was removed and a  dressing was applied. The patient tolerated the procedure well without immediate post procedural complication. FINDINGS: A total of approximately 2.8 L of bright yellow fluid was removed. Samples were sent to the laboratory as requested by the clinical team. IMPRESSION: Successful ultrasound-guided paracentesis yielding 2.8 liters of peritoneal fluid. Electronically Signed   By: Lavonia Dana M.D.   On: 12/21/2022 11:25   CT CHEST ABDOMEN PELVIS W CONTRAST  Result Date: 12/20/2022 CLINICAL DATA:  Epigastric pain and shortness of breath since  this morning. EXAM: CT CHEST, ABDOMEN, AND PELVIS WITH CONTRAST TECHNIQUE: Multidetector CT imaging of the chest, abdomen and pelvis was performed following the standard protocol during bolus administration of intravenous contrast. RADIATION DOSE REDUCTION: This exam was performed according to the departmental dose-optimization program which includes automated exposure control, adjustment of the mA and/or kV according to patient size and/or use of iterative reconstruction technique. CONTRAST:  110m OMNIPAQUE IOHEXOL 300 MG/ML  SOLN COMPARISON:  CT abdomen and pelvis 10/30/2022 FINDINGS: CT CHEST FINDINGS Cardiovascular: Normal heart size. No pericardial effusions. Normal caliber thoracic aorta. No dissection. Calcifications in the origin of the left subclavian artery. Mediastinum/Nodes: Esophagus is decompressed. No significant lymphadenopathy. Thyroid gland is unremarkable. Lungs/Pleura: Dense consolidation in the left lingula with patchy areas of ground-glass airspace disease scattered throughout both lungs. Changes are most likely to represent multifocal pneumonia. Follow-up to resolution is recommended to exclude underlying nodules. Small left pleural effusion with fluid in the left major fissure. Musculoskeletal: No chest wall mass or suspicious bone lesions identified. CT ABDOMEN PELVIS FINDINGS Hepatobiliary: Fatty infiltration in the liver has improved since the prior  study. No focal lesions. Gallbladder is decompressed with gallbladder wall thickening, likely related to liver disease. No bile duct dilatation. Portal veins are patent. Pancreas: Unremarkable. No pancreatic ductal dilatation or surrounding inflammatory changes. Spleen: Spleen is enlarged. Adrenals/Urinary Tract: Adrenal glands are unremarkable. Kidneys are normal, without renal calculi, focal lesion, or hydronephrosis. Bladder is unremarkable. Stomach/Bowel: Stomach, small bowel, and colon are not abnormally distended. No wall thickening or inflammatory changes. Appendix is normal. Vascular/Lymphatic: Calcification of the aorta without aneurysm. No significant lymphadenopathy. Upper abdominal varices. Periumbilical vein varices. Reproductive: Uterus and bilateral adnexa are unremarkable. Other: Moderate free fluid throughout the abdomen and pelvis, likely ascites. No free air. Edema in the subcutaneous fat. Musculoskeletal: No acute or significant osseous findings. IMPRESSION: 1. Consolidation in the left lingula with patchy airspace infiltrates throughout both lungs, likely multifocal pneumonia. Small left pleural effusion. 2. Probable hepatic cirrhosis with improved fatty infiltration of the liver since prior study. Diffuse abdominal and pelvic ascites. Upper abdominal varices. Ascites has progressed since prior study. 3. Aortic atherosclerosis. Electronically Signed   By: WLucienne CapersM.D.   On: 12/20/2022 20:59   DG Chest 2 View  Result Date: 12/20/2022 CLINICAL DATA:  Dyspnea EXAM: CHEST - 2 VIEW COMPARISON:  CXR 12/05/22 FINDINGS: Possible small left apical pneumothorax. No pleural effusion. No pneumothorax. There is a persistent hazy opacity in the left upper lobe. Normal cardiac and mediastinal contours. No radiographically apparent displaced rib fractures. Visualized upper abdomen is unremarkable. Vertebral body heights are maintained. IMPRESSION: 1. Possible small left apical pneumothorax. 2.  Persistent hazy opacity in the left upper lobe, which could represent an evolving infection. Electronically Signed   By: HMarin RobertsM.D.   On: 12/20/2022 19:27    Assessment: 42y.o. 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  Decompensated cirrhosis: Abstinent from alcohol since November 2023. MELD 3.0 is 18 today, down from 20 yesterday. First ever paracentesis yesterday yielded 2.8 L.  Negative for SBP.  Abdomen is still full, but without tense ascites. Portal vein patent on CT.  Outpatient diuretic regimen included Lasix 20 mg  twice daily and Aldactone 25 mg twice daily. Reports non-compliance with 2g sodium diet restriction which was likely etiology of worsening ascites/peripheral edema. She is currently receiving IV Lasix 40 mg BID and aldactone 25 mg BID and is presenting with persistent hypokalemia, K 2.7 today. Will decrease Lasix to 40 mg daily and increase aldactone 100 mg daily to see if this will help prevent ongoing hypokalemia. Counseled on strict 2g sodium diet. Could consider repeat para prior to discharge if needed. Also needs outpatient EGD for variceal screening.   History of HE: Ammonia slightly elevated today at 41, but no confusion or mental status changes.  Do not recommend following ammonia.  Would only recheck ammonia if she presents with changes in mental status.  Recommend continuing current dose of lactulose and Xifaxan.    Anemia:  Hemoglobin 8.4 on admission, down to 7.6 yesterday, 7.2 today.  No overt GI bleeding. No iron deficiency. Receiving IV albumin this admission, likely accounting for mild decline in hemoglobin this  admission. She will need outpatient EGD for variceal screening.  History of PBC:  Off Ursodiol for an unspecified amount of time in the outpatient setting.  Ursodiol has been restarted this admission.  Hypokalemia:  Persistent hypokalemia.  Potassium 2.8 this morning.  I am adjusting diuretics, increasing spironolactone and decreasing Lasix, to help prevent further potassium losses.  Will defer replacement to hospitalist.  Vomiting: Single episode of vomiting yesterday after lunch.  Patient feels that this was secondary to eating too soon after paracentesis.  Had not been having any nausea or vomiting prior to admission and is currently feeling well today.  Has been receiving scheduled Zofran since yesterday.  Will decrease this to as needed and see how she does.   Plan: Decrease Lasix to 40 mg daily. Increase spironolactone to 100 mg daily.  As she has already received dose this morning, will give 50 mg this evening and start 100 mg tomorrow morning. Potassium replacement per hospitalist. Continue lactulose and Xifaxan. Strict 2 g sodium diet. Consider repeat paracentesis this admission prior to discharge if needed. Monitor H&H and for overt GI bleeding.  Transfuse if hemoglobin less than 7. Continue ursodiol 300 mg TID Change Zofran to PRN.  Monitor for recurrent nausea/vomiting. Continue PPI twice daily. CMP and INR daily. EGD outpatient for variceal screening. Continue alcohol cessation.    LOS: 2 days    12/22/2022, 9:32 AM   Aliene Altes, PA-C [facility]

## 2022-12-22 NOTE — Progress Notes (Signed)
PROGRESS NOTE    Patient: Mallory Mendoza                            PCP: Carrolyn Meiers, MD                    DOB: 05/28/1981            DOA: 12/20/2022 KPT:[id number]             DOS: 12/22/2022, 9:31 AM   LOS: 2 days   Date of Service: The patient was seen and examined on 12/22/2022  Subjective:   This morning, stable no acute distress Complaining of mild tenderness at the site of paracentesis. Improved abdominal distention   Status post paracentesis 12/21/22-.  Yielding 2.8 L of bright yellow fluid extraction  Brief Narrative:    Mallory Mendoza is a 42 y.o. female with medical history significant of history of alcohol abuse, chronic cirrhosis, GERD, tobacco abuse who presents to the emergency department due to 1 week onset of increasing leg swelling and abdominal distention, this worsened today with increased shortness of breath than baseline.  She states that she has never had paracentesis and denies fever, chills, chest pain, nausea, vomiting, abdominal pain. Patient was recently admitted from 1/19 to 1/22 due to anasarca, hypokalemia which was treated with IV diuresis and potassium was replenished   ED Course:  RR 22/min, pulse 103 bpm, BP 104/75, O2 sat was 100% on room air.  Workup in the ED showed hemoglobin of 8.4, hematocrit 26.0, WBC 10.88, platelets 277, ammonia 20, BMP sodium 134, potassium 3.0, chloride 103, bicarb 21, blood glucose 118, BUN 13, creatinine 1.06, albumin 2.7, AST 37, ALT 16, ALP 117, total bilirubin 3.4, BNP 169. Chest x-ray - showed possible small left apical pneumothorax and persistent hazy opacity in left upper lobe which could represent an evolving infection. CT angiogram of chest with contrast showed: 1. Consolidation in the left lingula with patchy airspace infiltrates throughout both lungs, likely multifocal pneumonia. Small left pleural effusion. 2. Probable hepatic cirrhosis with improved fatty infiltration of the liver since prior study.  Diffuse abdominal and pelvic ascites. Upper abdominal varices. Ascites has progressed since prior study. 3. Aortic atherosclerosis. IV Lasix 40 mg x 1 was given, Norco was given, potassium was replenished, magnesium oxide 800 mg x 1 and IV albumin 25 g x 1 were given.   Hospitalist was asked to admit patient for further evaluation and management.   Assessment/Plan   Principal Problem:   Alcoholic cirrhosis of liver with ascites (HCC) Active Problems:   Tobacco use disorder   Hyperbilirubinemia   Hypokalemia   GERD (gastroesophageal reflux disease)   CAP (community acquired pneumonia)   Anasarca   Elevated brain natriuretic peptide (BNP) level   Abdominal pain  Alcoholic cirrhosis of liver with ascites / Anasarca/ Hypoalbuminemia -Stable, mild tenderness at the site of paracentesis, improved distention -  Continue IV Lasix and spironolactone - Albumin 2.7; IV albumin  Continue rifaximin, lactulose, patient has since abstained from alcohol use - Continue folic acid,  thiamine and multivitamin  - Paracentesis was ordered >>> yielding 2.8 L of clear yellow fluid     CAP POA -Improved shortness of breath, cough - CT of the chest was suggestive of multifocal pneumonia - Patient was started on ceftriaxone and azithromycin, will continue  - will F/up w  Blood culture, sputum culture, urine Legionella, strep pneumo  -  Procalcitonin 0.22 - Continue Mucinex, incentive spirometry, flutter valve    Elevated BNP rule out acute CHF -Elevated BNP, edema-has improved likely exacerbation due to cirrhosis -  Ruled out CHF - Continue total input/output, daily weights and fluid restriction - Continue IV Lasix 40 mg twice daily - Continue Cardiac diet             - Echocardiogram >>> FINDINGS   Left Ventricle: Left ventricular ejection fraction, by estimation, is 65  to 70%. The left ventricle has normal function. The left ventricle has no  regional wall motion abnormalities. The left  ventricular internal cavity  size was normal in size. There is   no left ventricular hypertroph    Abdominal pain -Improved post paracentesis Continue IV morphine 2 mg every 4 hours as needed for moderate/severe pain Continue Protonix   Hypokalemia/hypomagnesemia Potassium 3.0 >> 2.7  replenished   GERD Continue Protonix   Tobacco use disorder Patient was counseled on tobacco abuse cessation Continue nicotine patch  Anemia of chronic disease -Likely related to chronic liver cirrhosis -Hemoglobin 8.4, 7.6 today    Latest Ref Rng & Units 12/22/2022    8:24 AM 12/22/2022    4:38 AM 12/21/2022    4:28 AM  CBC  WBC 4.0 - 10.5 K/uL  8.1  8.3   Hemoglobin 12.0 - 15.0 g/dL 7.2  7.1  7.6   Hematocrit 36.0 - 46.0 % 22.4  22.3  23.8   Platelets 150 - 400 K/uL  252  280       -Obtaining iron studies -Will transfuse if symptomatic, hypotensive, or hemoglobin < 7.0      ------------------------------------------------------------------------------------------------------------------------ Nutritional status:  The patient's BMI is: Body mass index is 22.29 kg/m. I agree with the assessment and plan as outlined ------------------------------------------------------------------------------------------------------------------------- Cultures; Blood Cultures x 2 >>  Sputum Culture >>     ------------------------------------------------------------------------------------------------------------------------------------------------  DVT prophylaxis:  SCDs Start: 12/20/22 2347   Code Status:   Code Status: Full Code  Family Communication: No family member present at bedside- attempt will be made to update daily The above findings and plan of care has been discussed with patient (and family)  in detail,  they expressed understanding and agreement of above. -Advance care planning has been discussed.   Admission status:   Status is: Inpatient Remains inpatient appropriate because:  Procedures such as paracentesis, diuretics treating underlying liver cirrhosis anasarca   Disposition: From  - home             Planning for discharge in 1-2 days: to home  Procedures:   No admission procedures for hospital encounter.   Antimicrobials:  Anti-infectives (From admission, onward)    Start     Dose/Rate Route Frequency Ordered Stop   12/21/22 2200  cefTRIAXone (ROCEPHIN) 1 g in sodium chloride 0.9 % 100 mL IVPB        1 g 200 mL/hr over 30 Minutes Intravenous Every 24 hours 12/20/22 2340 12/25/22 2159   12/21/22 1300  azithromycin (ZITHROMAX) tablet 500 mg        500 mg Oral Daily 12/21/22 1203     12/21/22 0015  rifaximin (XIFAXAN) tablet 550 mg        550 mg Oral 2 times daily 12/21/22 0006     12/20/22 2115  cefTRIAXone (ROCEPHIN) 2 g in sodium chloride 0.9 % 100 mL IVPB        2 g 200 mL/hr over 30 Minutes Intravenous  Once 12/20/22 2104 12/20/22 2357  12/20/22 2115  azithromycin (ZITHROMAX) 500 mg in sodium chloride 0.9 % 250 mL IVPB  Status:  Discontinued        500 mg 250 mL/hr over 60 Minutes Intravenous Every 24 hours 12/20/22 2104 12/21/22 1203        Medication:   azithromycin  500 mg Oral Daily   dextromethorphan-guaiFENesin  1 tablet Oral BID   feeding supplement  237 mL Oral BID BM   folic acid  1 mg Oral Daily   furosemide  40 mg Intravenous Q12H   lactulose  20 g Oral TID   melatonin  6 mg Oral QHS   multivitamin with minerals  1 tablet Oral Daily   nicotine  14 mg Transdermal Daily   ondansetron (ZOFRAN) IV  4 mg Intravenous Q6H   pantoprazole  40 mg Oral BID   rifaximin  550 mg Oral BID   spironolactone  25 mg Oral BID   thiamine  100 mg Oral Daily   ursodiol  300 mg Oral TID    morphine injection, ondansetron **OR** ondansetron (ZOFRAN) IV   Objective:   Vitals:   12/21/22 1621 12/21/22 1900 12/22/22 0441 12/22/22 0445  BP: 120/65 116/70 113/64   Pulse: (!) 110 (!) 115 (!) 115   Resp: '18 18 18   '$ Temp: 99.5 F (37.5 C) 99.2  F (37.3 C) 98.8 F (37.1 C)   TempSrc: Oral Oral Oral   SpO2: 100% 100% 100%   Weight:    58.9 kg  Height:        Intake/Output Summary (Last 24 hours) at 12/22/2022 0931 Last data filed at 12/21/2022 1429 Gross per 24 hour  Intake 240 ml  Output --  Net 240 ml   Filed Weights   12/21/22 0356 12/22/22 0445  Weight: 64.6 kg 58.9 kg     Physical examination:    General:  AAO x 3,  cooperative, no distress;   HEENT:  Normocephalic, PERRL, otherwise with in Normal limits   Neuro:  CNII-XII intact. , normal motor and sensation, reflexes intact   Lungs:   Clear to auscultation BL, Respirations unlabored,  No wheezes / crackles  Cardio:    S1/S2, RRR, No murmure, No Rubs or Gallops   Abdomen:  Soft, distended, fluid shift noted, non-tender, bowel sounds active all four quadrants, no guarding or peritoneal signs.  Muscular  skeletal:  Limited exam -global generalized weaknesses - in bed, able to move all 4 extremities,   2+ pulses,  symmetric, No pitting edema  Skin:  Dry, warm to touch, negative for any Rashes,  Wounds: Please see nursing documentation         ------------------------------------------------------------------------------------------------------------------------------------------    LABs:     Latest Ref Rng & Units 12/22/2022    8:24 AM 12/22/2022    4:38 AM 12/21/2022    4:28 AM  CBC  WBC 4.0 - 10.5 K/uL  8.1  8.3   Hemoglobin 12.0 - 15.0 g/dL 7.2  7.1  7.6   Hematocrit 36.0 - 46.0 % 22.4  22.3  23.8   Platelets 150 - 400 K/uL  252  280       Latest Ref Rng & Units 12/22/2022    4:38 AM 12/21/2022    4:28 AM 12/20/2022    6:01 PM  CMP  Glucose 70 - 99 mg/dL 78  84  118   BUN 6 - 20 mg/dL '9  13  13   '$ Creatinine 0.44 - 1.00 mg/dL 0.77  1.05  1.06   Sodium 135 - 145 mmol/L 137  136  134   Potassium 3.5 - 5.1 mmol/L 2.8  2.7  3.0   Chloride 98 - 111 mmol/L 104  105  103   CO2 22 - 32 mmol/L '23  21  21   '$ Calcium 8.9 - 10.3 mg/dL 9.0  8.7  8.7   Total  Protein 6.5 - 8.1 g/dL 6.4  6.4  7.0   Total Bilirubin 0.3 - 1.2 mg/dL 2.8  2.9  3.4   Alkaline Phos 38 - 126 U/L 81  86  112   AST 15 - 41 U/L 31  31  37   ALT 0 - 44 U/L '15  15  16        '$ Micro Results Recent Results (from the past 240 hour(s))  Culture, body fluid w Gram Stain-bottle     Status: None (Preliminary result)   Collection Time: 12/21/22  9:34 AM   Specimen: Peritoneal Cavity  Result Value Ref Range Status   Specimen Description   Final    PERITONEAL CAVITY BOTTLES DRAWN AEROBIC AND ANAEROBIC   Special Requests Blood Culture adequate volume  Final   Culture   Final    NO GROWTH < 24 HOURS Performed at [facility], [address], [place], [place] [address]    Report Status PENDING  Incomplete  Gram stain     Status: None   Collection Time: 12/21/22  9:34 AM   Specimen: Peritoneal Washings  Result Value Ref Range Status   Specimen Description PERITONEAL  Final   Special Requests NONE  Final   Gram Stain   Final    NO ORGANISMS SEEN WBC PRESENT,BOTH PMN AND MONONUCLEAR CYTOSPIN SMEAR Performed at [facility], [address] [place], [place] [address]    Report Status 12/21/2022 FINAL  Final    Radiology Reports ECHOCARDIOGRAM COMPLETE  Result Date: 12/21/2022    ECHOCARDIOGRAM REPORT   Patient Name:   Mallory Mendoza [facility] Date of Exam: 12/21/2022 Medical Rec #:  [id number]       Height:       64.0 in Accession #:    [id number]      Weight:       142.4 lb Date of Birth:  1981/08/04       BSA:          1.693 m Patient Age:    50 years        BP:           103/73 mmHg Patient Gender: F               HR:           101 bpm. Exam Location:  [facility] Procedure: 2D Echo, Cardiac Doppler, Color Doppler and 3D Echo Indications:    CHF-Acute Diastolic N23.55  History:        Patient has no prior history of Echocardiogram examinations.                 Signs/Symptoms:Shortness of Breath; Risk Factors:Current Smoker.  Sonographer:    Greer Pickerel Referring Phys: [id number] OLADAPO  ADEFESO  Sonographer Comments: Image acquisition challenging due to patient body habitus. Global longitudinal strain was attempted. IMPRESSIONS  1. Left ventricular ejection fraction, by estimation, is 65 to 70%. The left ventricle has normal function. The left ventricle has no regional wall motion abnormalities. Left ventricular diastolic parameters were normal.  2. Right ventricular systolic function is normal. The right  ventricular size is normal. Tricuspid regurgitation signal is inadequate for assessing PA pressure.  3. Left atrial size was mildly dilated.  4. The mitral valve is normal in structure. No evidence of mitral valve regurgitation. No evidence of mitral stenosis.  5. The tricuspid valve is abnormal.  6. The aortic valve is tricuspid. Aortic valve regurgitation is not visualized. No aortic stenosis is present. FINDINGS  Left Ventricle: Left ventricular ejection fraction, by estimation, is 65 to 70%. The left ventricle has normal function. The left ventricle has no regional wall motion abnormalities. The left ventricular internal cavity size was normal in size. There is  no left ventricular hypertrophy. Left ventricular diastolic parameters were normal. Right Ventricle: The right ventricular size is normal. Right vetricular wall thickness was not well visualized. Right ventricular systolic function is normal. Tricuspid regurgitation signal is inadequate for assessing PA pressure. Left Atrium: Left atrial size was mildly dilated. Right Atrium: Right atrial size was normal in size. Pericardium: There is no evidence of pericardial effusion. Mitral Valve: The mitral valve is normal in structure. No evidence of mitral valve regurgitation. No evidence of mitral valve stenosis. Tricuspid Valve: The tricuspid valve is abnormal. Tricuspid valve regurgitation is mild . No evidence of tricuspid stenosis. Aortic Valve: The aortic valve is tricuspid. Aortic valve regurgitation is not visualized. No aortic stenosis  is present. Aortic valve mean gradient measures 9.6 mmHg. Aortic valve peak gradient measures 21.6 mmHg. Aortic valve area, by VTI measures 1.74  cm. Pulmonic Valve: The pulmonic valve was not well visualized. Pulmonic valve regurgitation is not visualized. No evidence of pulmonic stenosis. Aorta: The aortic root is normal in size and structure. IAS/Shunts: No atrial level shunt detected by color flow Doppler.  LEFT VENTRICLE PLAX 2D LVIDd:         4.30 cm   Diastology LVIDs:         2.50 cm   LV e' medial:    11.00 cm/s LV PW:         0.90 cm   LV E/e' medial:  11.5 LV IVS:        0.70 cm   LV e' lateral:   13.40 cm/s LVOT diam:     1.90 cm   LV E/e' lateral: 9.5 LV SV:         60 LV SV Index:   36 LVOT Area:     2.84 cm                           3D Volume EF:                          3D EF:        60 %                          LV EDV:       125 ml                          LV ESV:       50 ml                          LV SV:        75 ml RIGHT VENTRICLE RV S prime:     16.70 cm/s TAPSE (M-mode): 2.4 cm LEFT  ATRIUM             Index        RIGHT ATRIUM           Index LA diam:        4.00 cm 2.36 cm/m   RA Area:     18.60 cm LA Vol (A2C):   73.6 ml 43.46 ml/m  RA Volume:   52.30 ml  30.88 ml/m LA Vol (A4C):   51.9 ml 30.65 ml/m LA Biplane Vol: 61.8 ml 36.49 ml/m  AORTIC VALVE AV Area (Vmax):    1.51 cm AV Area (Vmean):   1.60 cm AV Area (VTI):     1.74 cm AV Vmax:           232.38 cm/s AV Vmean:          144.377 cm/s AV VTI:            0.347 m AV Peak Grad:      21.6 mmHg AV Mean Grad:      9.6 mmHg LVOT Vmax:         124.00 cm/s LVOT Vmean:        81.500 cm/s LVOT VTI:          0.213 m LVOT/AV VTI ratio: 0.61  AORTA Ao Root diam: 3.10 cm Ao Asc diam:  3.10 cm MITRAL VALVE MV Area (PHT): 4.86 cm     SHUNTS MV Decel Time: 156 msec     Systemic VTI:  0.21 m MV E velocity: 127.00 cm/s  Systemic Diam: 1.90 cm MV A velocity: 115.00 cm/s MV E/A ratio:  1.10 Carlyle Dolly MD Electronically signed by Carlyle Dolly MD Signature Date/Time: 12/21/2022/12:30:57 PM    Final    US Paracentesis  Result Date: 12/21/2022 INDICATION: Biliary cirrhosis, ascites EXAM: ULTRASOUND GUIDED DIAGNOSTIC AND THERAPEUTIC PARACENTESIS MEDICATIONS: None. COMPLICATIONS: None immediate. PROCEDURE: Informed written consent was obtained from the patient after a discussion of the risks, benefits and alternatives to treatment. A timeout was performed prior to the initiation of the procedure. Initial ultrasound scanning demonstrates a moderate amount of ascites within the right lower abdominal quadrant. The right lower abdomen was prepped and draped in the usual sterile fashion. 1% lidocaine was used for local anesthesia. Following this, a 19 gauge, 7-cm, Yueh catheter was introduced. An ultrasound image was saved for documentation purposes. The paracentesis was performed. The catheter was removed and a dressing was applied. The patient tolerated the procedure well without immediate post procedural complication. FINDINGS: A total of approximately 2.8 L of bright yellow fluid was removed. Samples were sent to the laboratory as requested by the clinical team. IMPRESSION: Successful ultrasound-guided paracentesis yielding 2.8 liters of peritoneal fluid. Electronically Signed   By: Lavonia Dana M.D.   On: 12/21/2022 11:25    SIGNED: Deatra James, MD, FHM. FAAFP. [facility] Pontes - [facility] Time spent > 35 min.  In seeing, evaluating and examining the patient. Reviewing medical records, labs, drawn plan of care. [facility],  Pager (please use [email or web address] to page/ text) Please use Epic Secure Chat for non-urgent communication (7AM-7PM)  If 7PM-7AM, please contact night-coverage [email or web address], 12/22/2022, 9:31 AM

## 2022-12-23 DIAGNOSIS — K7031 Alcoholic cirrhosis of liver with ascites: Secondary | ICD-10-CM | POA: Diagnosis not present

## 2022-12-23 LAB — CBC
HCT: 22.1 % — ABNORMAL LOW (ref 36.0–46.0)
Hemoglobin: 7.2 g/dL — ABNORMAL LOW (ref 12.0–15.0)
MCH: 32.4 pg (ref 26.0–34.0)
MCHC: 32.6 g/dL (ref 30.0–36.0)
MCV: 99.5 fL (ref 80.0–100.0)
Platelets: 272 10*3/uL (ref 150–400)
RBC: 2.22 MIL/uL — ABNORMAL LOW (ref 3.87–5.11)
RDW: 18.8 % — ABNORMAL HIGH (ref 11.5–15.5)
WBC: 8.8 10*3/uL (ref 4.0–10.5)
nRBC: 0 % (ref 0.0–0.2)

## 2022-12-23 LAB — COMPREHENSIVE METABOLIC PANEL
ALT: 13 U/L (ref 0–44)
AST: 37 U/L (ref 15–41)
Albumin: 2.9 g/dL — ABNORMAL LOW (ref 3.5–5.0)
Alkaline Phosphatase: 77 U/L (ref 38–126)
Anion gap: 11 (ref 5–15)
BUN: 8 mg/dL (ref 6–20)
CO2: 24 mmol/L (ref 22–32)
Calcium: 9.1 mg/dL (ref 8.9–10.3)
Chloride: 100 mmol/L (ref 98–111)
Creatinine, Ser: 0.66 mg/dL (ref 0.44–1.00)
GFR, Estimated: >60 mL/min (ref >60)
Glucose, Bld: 87 mg/dL (ref 70–99)
Potassium: 2.8 mmol/L — ABNORMAL LOW (ref 3.5–5.1)
Sodium: 135 mmol/L (ref 135–145)
Total Bilirubin: 2.6 mg/dL — ABNORMAL HIGH (ref 0.3–1.2)
Total Protein: 6.6 g/dL (ref 6.5–8.1)

## 2022-12-23 LAB — PROTIME-INR
INR: 1.7 — ABNORMAL HIGH (ref 0.8–1.2)
Prothrombin Time: 20.2 seconds — ABNORMAL HIGH (ref 11.4–15.2)

## 2022-12-23 LAB — PATHOLOGIST SMEAR REVIEW

## 2022-12-23 LAB — AMMONIA: Ammonia: 70 umol/L — ABNORMAL HIGH (ref 9–35)

## 2022-12-23 LAB — POTASSIUM: Potassium: 3.4 mmol/L — ABNORMAL LOW (ref 3.5–5.1)

## 2022-12-23 MED ORDER — SPIRONOLACTONE 100 MG PO TABS: 100.0000 mg | ORAL_TABLET | Freq: Every day | ORAL | 1 refills | Status: DC

## 2022-12-23 MED ORDER — POTASSIUM CHLORIDE CRYS ER 20 MEQ PO TBCR
40.0000 meq | EXTENDED_RELEASE_TABLET | Freq: Once | ORAL | Status: AC
  Administered 2022-12-23: 40 meq via ORAL
  Filled 2022-12-23: qty 2

## 2022-12-23 MED ORDER — POTASSIUM CHLORIDE CRYS ER 20 MEQ PO TBCR: 40.0000 meq | EXTENDED_RELEASE_TABLET | Freq: Once | ORAL | Status: DC

## 2022-12-23 MED ORDER — POTASSIUM CHLORIDE 10 MEQ/100ML IV SOLN
10.0000 meq | INTRAVENOUS | Status: AC
  Administered 2022-12-23 (×4): 10 meq via INTRAVENOUS
  Filled 2022-12-23 (×4): qty 100

## 2022-12-23 MED ORDER — POTASSIUM CHLORIDE CRYS ER 10 MEQ PO TBCR: 10.0000 meq | EXTENDED_RELEASE_TABLET | Freq: Two times a day (BID) | ORAL | 0 refills | Status: DC

## 2022-12-23 MED ORDER — URSODIOL 300 MG PO CAPS: 300.0000 mg | ORAL_CAPSULE | Freq: Three times a day (TID) | ORAL | 1 refills | Status: AC

## 2022-12-23 MED ORDER — AMOXICILLIN-POT CLAVULANATE 500-125 MG PO TABS: 1.0000 | ORAL_TABLET | Freq: Three times a day (TID) | ORAL | 0 refills | Status: AC

## 2022-12-23 NOTE — Discharge Summary (Addendum)
Physician Discharge Summary  Mallory Mendoza DOB: 1980/12/10 DOA: 12/20/2022  PCP: Mallory Meiers, MD  Admit date: 12/20/2022  Discharge date: 12/23/2022  Admitted From:Home  Disposition:  Home  Recommendations for Outpatient Follow-up:  Follow up with PCP in 1-2 weeks Follow-up with [facility] gastroenterology with Dr. Abbey Chatters 2/15 Continue on Lasix and spironolactone as prescribed Short-term potassium supplementation provided, please recheck BMP in 1-2 weeks to ensure potassium levels are stabilized Continue on Augmentin to finish course of treatment for community-acquired pneumonia  Home Health: None  Equipment/Devices: None  Discharge Condition:Stable  CODE STATUS: Full  Diet recommendation: Heart Healthy  Brief/Interim Summary: Mallory Mendoza is a 42 y.o. female with medical history significant of history of alcohol abuse, chronic cirrhosis, GERD, tobacco abuse who presents to the emergency department due to 1 week onset of increasing leg swelling and abdominal distention, this worsened today with increased shortness of breath than baseline.  She was lost to follow-up outpatient and had a recent admission 1/19 - 1/22 due to anasarca and presented to the ED with worsening edema and abdominal distention on 2/4 and also had community-acquired pneumonia with decompensated cirrhosis.  She underwent paracentesis on 2/5 with 2.8 L of fluid removed.  Her diuretics were adjusted by GI and she was noted to have some issues with hypokalemia, but appears to be much improved today and is in stable condition for discharge.  She will have outpatient follow-up with gastroenterology and this will be scheduled.  Discharge Diagnoses:  Principal Problem:   Alcoholic cirrhosis of liver with ascites (HCC) Active Problems:   Tobacco use disorder   Hyperbilirubinemia   Hypokalemia   GERD (gastroesophageal reflux disease)   CAP (community acquired pneumonia)   Anasarca    Elevated brain natriuretic peptide (BNP) level   Abdominal pain   Anemia of chronic disease  Principal discharge diagnosis: Decompensated alcoholic cirrhosis with history of PBC.  Volume overload/anasarca and ascites requiring paracentesis on 2/5.  Discharge Instructions  Discharge Instructions     Diet - low sodium heart healthy   Complete by: As directed    Increase activity slowly   Complete by: As directed       Allergies as of 12/23/2022   No Known Allergies      Medication List     TAKE these medications    amoxicillin-clavulanate 500-125 MG tablet Commonly known as: Augmentin Take 1 tablet by mouth 3 (three) times daily for 3 days.   feeding supplement Liqd Take 237 mLs by mouth 2 (two) times daily between meals.   folic acid 1 MG tablet Commonly known as: FOLVITE Take 1 tablet (1 mg total) by mouth daily.   furosemide 40 MG tablet Commonly known as: LASIX Take 1 tablet (40 mg total) by mouth every morning.   gabapentin 100 MG capsule Commonly known as: NEURONTIN Take 100 mg by mouth at bedtime.   lactulose 10 GM/15ML solution Commonly known as: CHRONULAC Take 30 mLs (20 g total) by mouth 3 (three) times daily.   metoprolol tartrate 25 MG tablet Commonly known as: LOPRESSOR Take 1 tablet (25 mg total) by mouth 2 (two) times daily.   multivitamin with minerals Tabs tablet Take 1 tablet by mouth daily.   nicotine 14 mg/24hr patch Commonly known as: NICODERM CQ - dosed in mg/24 hours Place 1 patch (14 mg total) onto the skin daily.   ondansetron 4 MG tablet Commonly known as: Zofran Take 1 tablet (4 mg total) by mouth every 8 (  eight) hours as needed for nausea or vomiting.   pantoprazole 40 MG tablet Commonly known as: Protonix Take 1 tablet (40 mg total) by mouth 2 (two) times daily.   potassium chloride 10 MEQ tablet Commonly known as: KLOR-CON M Take 1 tablet (10 mEq total) by mouth 2 (two) times daily for 14 days.   rifaximin 550 MG Tabs  tablet Commonly known as: XIFAXAN Take 1 tablet (550 mg total) by mouth 2 (two) times daily.   spironolactone 100 MG tablet Commonly known as: ALDACTONE Take 1 tablet (100 mg total) by mouth daily. Start taking on: December 24, 2022 What changed:  medication strength how much to take when to take this   thiamine 100 MG tablet Commonly known as: VITAMIN B1 Take 1 tablet (100 mg total) by mouth daily.   ursodiol 300 MG capsule Commonly known as: ACTIGALL Take 1 capsule (300 mg total) by mouth 3 (three) times daily.        Follow-up Information     Mallory Meiers, MD. Go on 01/01/2023.   Specialty: Internal Medicine Why: Feb. 16, 2024 at 9:30am Contact information: [place] [address] [phone]         [place]. Schedule an appointment as soon as possible for a visit.   Contact information: [address] [place] [phone]               No Known Allergies  Consultations: GI   Procedures/Studies: [place] ASCITES (ABDOMEN LIMITED)  Result Date: 12/22/2022 CLINICAL DATA:  Ascites, post paracentesis 1 day ago, abdominal distension EXAM: LIMITED ABDOMEN ULTRASOUND FOR ASCITES TECHNIQUE: Limited ultrasound survey for ascites was performed in all four abdominal quadrants. COMPARISON:  12/21/2022 FINDINGS: Small volume ascites identified perihepatic and LEFT upper quadrant. Only a small volume of ascites is seen in the RIGHT lower quadrant, none LEFT lower quadrant. Volume of ascites is insufficient for expected therapeutic benefit from paracentesis. IMPRESSION: Small volume ascites, insufficient for expected therapeutic benefit from paracentesis. If abdominal distension increases over subsequent days, recommend recheck for free accumulation of ascites. Electronically Signed   By: Lavonia Dana M.D.   On: 12/22/2022 11:21   ECHOCARDIOGRAM COMPLETE  Result Date: 12/21/2022     ECHOCARDIOGRAM REPORT   Patient Name:   Mallory Mendoza Date of Exam: 12/21/2022 Medical Rec #:  [id number]       Height:       64.0 in Accession #:    [id number]      Weight:       142.4 lb Date of Birth:  25-Oct-1981       BSA:          1.693 m Patient Age:    42 years        BP:           103/73 mmHg Patient Gender: F               HR:           101 bpm. Exam Location:  [facility] Procedure: 2D Echo, Cardiac Doppler, Color Doppler and 3D Echo Indications:    CHF-Acute Diastolic G53.64  History:        Patient has no prior history of Echocardiogram examinations.                 Signs/Symptoms:Shortness of Breath; Risk Factors:Current Smoker.  Sonographer:    Greer Pickerel Referring Phys: [id number] OLADAPO ADEFESO  Sonographer Comments: Image  acquisition challenging due to patient body habitus. Global longitudinal strain was attempted. IMPRESSIONS  1. Left ventricular ejection fraction, by estimation, is 65 to 70%. The left ventricle has normal function. The left ventricle has no regional wall motion abnormalities. Left ventricular diastolic parameters were normal.  2. Right ventricular systolic function is normal. The right ventricular size is normal. Tricuspid regurgitation signal is inadequate for assessing PA pressure.  3. Left atrial size was mildly dilated.  4. The mitral valve is normal in structure. No evidence of mitral valve regurgitation. No evidence of mitral stenosis.  5. The tricuspid valve is abnormal.  6. The aortic valve is tricuspid. Aortic valve regurgitation is not visualized. No aortic stenosis is present. FINDINGS  Left Ventricle: Left ventricular ejection fraction, by estimation, is 65 to 70%. The left ventricle has normal function. The left ventricle has no regional wall motion abnormalities. The left ventricular internal cavity size was normal in size. There is  no left ventricular hypertrophy. Left ventricular diastolic parameters were normal. Right Ventricle: The right ventricular size is  normal. Right vetricular wall thickness was not well visualized. Right ventricular systolic function is normal. Tricuspid regurgitation signal is inadequate for assessing PA pressure. Left Atrium: Left atrial size was mildly dilated. Right Atrium: Right atrial size was normal in size. Pericardium: There is no evidence of pericardial effusion. Mitral Valve: The mitral valve is normal in structure. No evidence of mitral valve regurgitation. No evidence of mitral valve stenosis. Tricuspid Valve: The tricuspid valve is abnormal. Tricuspid valve regurgitation is mild . No evidence of tricuspid stenosis. Aortic Valve: The aortic valve is tricuspid. Aortic valve regurgitation is not visualized. No aortic stenosis is present. Aortic valve mean gradient measures 9.6 mmHg. Aortic valve peak gradient measures 21.6 mmHg. Aortic valve area, by VTI measures 1.74  cm. Pulmonic Valve: The pulmonic valve was not well visualized. Pulmonic valve regurgitation is not visualized. No evidence of pulmonic stenosis. Aorta: The aortic root is normal in size and structure. IAS/Shunts: No atrial level shunt detected by color flow Doppler.  LEFT VENTRICLE PLAX 2D LVIDd:         4.30 cm   Diastology LVIDs:         2.50 cm   LV e' medial:    11.00 cm/s LV PW:         0.90 cm   LV E/e' medial:  11.5 LV IVS:        0.70 cm   LV e' lateral:   13.40 cm/s LVOT diam:     1.90 cm   LV E/e' lateral: 9.5 LV SV:         60 LV SV Index:   36 LVOT Area:     2.84 cm                           3D Volume EF:                          3D EF:        60 %                          LV EDV:       125 ml                          LV ESV:  50 ml                          LV SV:        75 ml RIGHT VENTRICLE RV S prime:     16.70 cm/s TAPSE (M-mode): 2.4 cm LEFT ATRIUM             Index        RIGHT ATRIUM           Index LA diam:        4.00 cm 2.36 cm/m   RA Area:     18.60 cm LA Vol (A2C):   73.6 ml 43.46 ml/m  RA Volume:   52.30 ml  30.88 ml/m LA Vol (A4C):    51.9 ml 30.65 ml/m LA Biplane Vol: 61.8 ml 36.49 ml/m  AORTIC VALVE AV Area (Vmax):    1.51 cm AV Area (Vmean):   1.60 cm AV Area (VTI):     1.74 cm AV Vmax:           232.38 cm/s AV Vmean:          144.377 cm/s AV VTI:            0.347 m AV Peak Grad:      21.6 mmHg AV Mean Grad:      9.6 mmHg LVOT Vmax:         124.00 cm/s LVOT Vmean:        81.500 cm/s LVOT VTI:          0.213 m LVOT/AV VTI ratio: 0.61  AORTA Ao Root diam: 3.10 cm Ao Asc diam:  3.10 cm MITRAL VALVE MV Area (PHT): 4.86 cm     SHUNTS MV Decel Time: 156 msec     Systemic VTI:  0.21 m MV E velocity: 127.00 cm/s  Systemic Diam: 1.90 cm MV A velocity: 115.00 cm/s MV E/A ratio:  1.10 Carlyle Dolly MD Electronically signed by Carlyle Dolly MD Signature Date/Time: 12/21/2022/12:30:57 PM    Final    US Paracentesis  Result Date: 12/21/2022 INDICATION: Biliary cirrhosis, ascites EXAM: ULTRASOUND GUIDED DIAGNOSTIC AND THERAPEUTIC PARACENTESIS MEDICATIONS: None. COMPLICATIONS: None immediate. PROCEDURE: Informed written consent was obtained from the patient after a discussion of the risks, benefits and alternatives to treatment. A timeout was performed prior to the initiation of the procedure. Initial ultrasound scanning demonstrates a moderate amount of ascites within the right lower abdominal quadrant. The right lower abdomen was prepped and draped in the usual sterile fashion. 1% lidocaine was used for local anesthesia. Following this, a 19 gauge, 7-cm, Yueh catheter was introduced. An ultrasound image was saved for documentation purposes. The paracentesis was performed. The catheter was removed and a dressing was applied. The patient tolerated the procedure well without immediate post procedural complication. FINDINGS: A total of approximately 2.8 L of bright yellow fluid was removed. Samples were sent to the laboratory as requested by the clinical team. IMPRESSION: Successful ultrasound-guided paracentesis yielding 2.8 liters of peritoneal  fluid. Electronically Signed   By: Lavonia Dana M.D.   On: 12/21/2022 11:25   CT CHEST ABDOMEN PELVIS W CONTRAST  Result Date: 12/20/2022 CLINICAL DATA:  Epigastric pain and shortness of breath since this morning. EXAM: CT CHEST, ABDOMEN, AND PELVIS WITH CONTRAST TECHNIQUE: Multidetector CT imaging of the chest, abdomen and pelvis was performed following the standard protocol during bolus administration of intravenous contrast. RADIATION DOSE REDUCTION: This exam was performed according to the departmental  dose-optimization program which includes automated exposure control, adjustment of the mA and/or kV according to patient size and/or use of iterative reconstruction technique. CONTRAST:  170m OMNIPAQUE IOHEXOL 300 MG/ML  SOLN COMPARISON:  CT abdomen and pelvis 10/30/2022 FINDINGS: CT CHEST FINDINGS Cardiovascular: Normal heart size. No pericardial effusions. Normal caliber thoracic aorta. No dissection. Calcifications in the origin of the left subclavian artery. Mediastinum/Nodes: Esophagus is decompressed. No significant lymphadenopathy. Thyroid gland is unremarkable. Lungs/Pleura: Dense consolidation in the left lingula with patchy areas of ground-glass airspace disease scattered throughout both lungs. Changes are most likely to represent multifocal pneumonia. Follow-up to resolution is recommended to exclude underlying nodules. Small left pleural effusion with fluid in the left major fissure. Musculoskeletal: No chest wall mass or suspicious bone lesions identified. CT ABDOMEN PELVIS FINDINGS Hepatobiliary: Fatty infiltration in the liver has improved since the prior study. No focal lesions. Gallbladder is decompressed with gallbladder wall thickening, likely related to liver disease. No bile duct dilatation. Portal veins are patent. Pancreas: Unremarkable. No pancreatic ductal dilatation or surrounding inflammatory changes. Spleen: Spleen is enlarged. Adrenals/Urinary Tract: Adrenal glands are unremarkable.  Kidneys are normal, without renal calculi, focal lesion, or hydronephrosis. Bladder is unremarkable. Stomach/Bowel: Stomach, small bowel, and colon are not abnormally distended. No wall thickening or inflammatory changes. Appendix is normal. Vascular/Lymphatic: Calcification of the aorta without aneurysm. No significant lymphadenopathy. Upper abdominal varices. Periumbilical vein varices. Reproductive: Uterus and bilateral adnexa are unremarkable. Other: Moderate free fluid throughout the abdomen and pelvis, likely ascites. No free air. Edema in the subcutaneous fat. Musculoskeletal: No acute or significant osseous findings. IMPRESSION: 1. Consolidation in the left lingula with patchy airspace infiltrates throughout both lungs, likely multifocal pneumonia. Small left pleural effusion. 2. Probable hepatic cirrhosis with improved fatty infiltration of the liver since prior study. Diffuse abdominal and pelvic ascites. Upper abdominal varices. Ascites has progressed since prior study. 3. Aortic atherosclerosis. Electronically Signed   By: WLucienne CapersM.D.   On: 12/20/2022 20:59   DG Chest 2 View  Result Date: 12/20/2022 CLINICAL DATA:  Dyspnea EXAM: CHEST - 2 VIEW COMPARISON:  CXR 12/05/22 FINDINGS: Possible small left apical pneumothorax. No pleural effusion. No pneumothorax. There is a persistent hazy opacity in the left upper lobe. Normal cardiac and mediastinal contours. No radiographically apparent displaced rib fractures. Visualized upper abdomen is unremarkable. Vertebral body heights are maintained. IMPRESSION: 1. Possible small left apical pneumothorax. 2. Persistent hazy opacity in the left upper lobe, which could represent an evolving infection. Electronically Signed   By: HMarin RobertsM.D.   On: 12/20/2022 19:27   DG CHEST PORT 1 VIEW  Result Date: 12/05/2022 CLINICAL DATA:  Chest pain EXAM: PORTABLE CHEST 1 VIEW COMPARISON:  10/30/2022 FINDINGS: Heart is normal size. Mediastinal contours within  normal limits. Consolidation in the left upper lobe compatible with pneumonia. Right lung clear. Small left pleural effusion. No acute bony abnormality. IMPRESSION: Left upper lobe pneumonia with small left effusion. Electronically Signed   By: KRolm BaptiseM.D.   On: 12/05/2022 09:26     Discharge Exam: Vitals:   12/23/22 0425 12/23/22 1245  BP: 117/62 117/76  Pulse: (!) 115 (!) 113  Resp: 18 18  Temp: 98.8 F (37.1 C) 98.2 F (36.8 C)  SpO2: 98% 100%   Vitals:   12/22/22 2014 12/23/22 0421 12/23/22 0425 12/23/22 1245  BP: 110/72  117/62 117/76  Pulse: (!) 107  (!) 115 (!) 113  Resp: '18  18 18  '$ Temp: 100 F (37.8 C)  98.8 F (37.1 C) 98.2 F (36.8 C)  TempSrc: Oral  Oral Oral  SpO2: 99%  98% 100%  Weight:  58.2 kg    Height:        General: Pt is alert, awake, not in acute distress Cardiovascular: RRR, S1/S2 +, no rubs, no gallops Respiratory: CTA bilaterally, no wheezing, no rhonchi Abdominal: Soft, NT, ND, bowel sounds + Extremities: no edema, no cyanosis    The results of significant diagnostics from this hospitalization (including imaging, microbiology, ancillary and laboratory) are listed below for reference.     Microbiology: Recent Results (from the past 240 hour(s))  Culture, body fluid w Gram Stain-bottle     Status: None (Preliminary result)   Collection Time: 12/21/22  9:34 AM   Specimen: Peritoneal Cavity  Result Value Ref Range Status   Specimen Description   Final    PERITONEAL CAVITY BOTTLES DRAWN AEROBIC AND ANAEROBIC   Special Requests Blood Culture adequate volume  Final   Culture   Final    NO GROWTH < 24 HOURS Performed at [facility], [address], [place], [place] [address]    Report Status PENDING  Incomplete  Gram stain     Status: None   Collection Time: 12/21/22  9:34 AM   Specimen: Peritoneal Washings  Result Value Ref Range Status   Specimen Description PERITONEAL  Final   Special Requests NONE  Final   Gram Stain   Final     NO ORGANISMS SEEN WBC PRESENT,BOTH PMN AND MONONUCLEAR CYTOSPIN SMEAR Performed at [facility], [address], [place], [place] [address]    Report Status 12/21/2022 FINAL  Final     Labs: BNP (last 3 results) Recent Labs    12/20/22 1801  BNP 376.2*   Basic Metabolic Panel: Recent Labs  Lab 12/20/22 1801 12/21/22 0428 12/22/22 0438 12/23/22 0418 12/23/22 1257  NA 134* 136 137 135  --   K 3.0* 2.7* 2.8* 2.8* 3.4*  CL 103 105 104 100  --   CO2 21* 21* 23 24  --   GLUCOSE 118* 84 78 87  --   BUN '13 13 9 8  '$ --   CREATININE 1.06* 1.05* 0.77 0.66  --   CALCIUM 8.7* 8.7* 9.0 9.1  --   MG  --  1.8  --   --   --   PHOS  --  4.4  --   --   --    Liver Function Tests: Recent Labs  Lab 12/20/22 1801 12/21/22 0428 12/22/22 0438 12/23/22 0418  AST 37 31 31 37  ALT '16 15 15 13  '$ ALKPHOS 112 86 81 77  BILITOT 3.4* 2.9* 2.8* 2.6*  PROT 7.0 6.4* 6.4* 6.6  ALBUMIN 2.7* 2.7* 3.1* 2.9*   No results for input(s): "LIPASE", "AMYLASE" in the last 168 hours. Recent Labs  Lab 12/20/22 1801 12/22/22 0438 12/23/22 0418  AMMONIA 20 41* 70*   CBC: Recent Labs  Lab 12/20/22 1801 12/21/22 0428 12/22/22 0438 12/22/22 0824 12/23/22 0418  WBC 10.3 8.3 8.1  --  8.8  NEUTROABS 6.5  --   --   --   --   HGB 8.4* 7.6* 7.1* 7.2* 7.2*  HCT 26.0* 23.8* 22.3* 22.4* 22.1*  MCV 100.8* 101.7* 100.9*  --  99.5  PLT 277 280 252  --  272   Cardiac Enzymes: No results for input(s): "CKTOTAL", "CKMB", "CKMBINDEX", "TROPONINI" in the last 168 hours. BNP: Invalid input(s): "POCBNP" CBG: No results for  input(s): "GLUCAP" in the last 168 hours. D-Dimer No results for input(s): "DDIMER" in the last 72 hours. Hgb A1c No results for input(s): "HGBA1C" in the last 72 hours. Lipid Profile No results for input(s): "CHOL", "HDL", "LDLCALC", "TRIG", "CHOLHDL", "LDLDIRECT" in the last 72 hours. Thyroid function studies No results for input(s): "TSH", "T4TOTAL", "T3FREE", "THYROIDAB" in the last  72 hours.  Invalid input(s): "FREET3" Anemia work up Recent Labs    12/21/22 1234 12/21/22 1332  FERRITIN  --  211  TIBC 99*  --   IRON 58  --    Urinalysis    Component Value Date/Time   COLORURINE AMBER (A) 10/30/2022 1953   APPEARANCEUR CLOUDY (A) 10/30/2022 1953   LABSPEC 1.002 (L) 10/30/2022 1953   PHURINE 5.0 10/30/2022 1953   GLUCOSEU 50 (A) 10/30/2022 1953   HGBUR LARGE (A) 10/30/2022 1953   BILIRUBINUR NEGATIVE 10/30/2022 1953   KETONESUR NEGATIVE 10/30/2022 1953   PROTEINUR 30 (A) 10/30/2022 1953   UROBILINOGEN 1.0 06/11/2014 0935   NITRITE NEGATIVE 10/30/2022 1953   LEUKOCYTESUR TRACE (A) 10/30/2022 1953   Sepsis Labs Recent Labs  Lab 12/20/22 1801 12/21/22 0428 12/22/22 0438 12/23/22 0418  WBC 10.3 8.3 8.1 8.8   Microbiology Recent Results (from the past 240 hour(s))  Culture, body fluid w Gram Stain-bottle     Status: None (Preliminary result)   Collection Time: 12/21/22  9:34 AM   Specimen: Peritoneal Cavity  Result Value Ref Range Status   Specimen Description   Final    PERITONEAL CAVITY BOTTLES DRAWN AEROBIC AND ANAEROBIC   Special Requests Blood Culture adequate volume  Final   Culture   Final    NO GROWTH < 24 HOURS Performed at [facility], [address] [place], [place] [address]    Report Status PENDING  Incomplete  Gram stain     Status: None   Collection Time: 12/21/22  9:34 AM   Specimen: Peritoneal Washings  Result Value Ref Range Status   Specimen Description PERITONEAL  Final   Special Requests NONE  Final   Gram Stain   Final    NO ORGANISMS SEEN WBC PRESENT,BOTH PMN AND MONONUCLEAR CYTOSPIN SMEAR Performed at [facility], [address], [place], [place] [address]    Report Status 12/21/2022 FINAL  Final     Time coordinating discharge: 35 minutes  SIGNED:   Rodena Goldmann, DO [facility] 12/23/2022, 3:03 PM  If 7PM-7AM, please contact night-coverage [email or web address]

## 2022-12-23 NOTE — Progress Notes (Signed)
Discharge instructions reviewed with patient and patient's son. Both verbalized understanding of instructions. Patient discharged home with son in stable condition.

## 2022-12-23 NOTE — Progress Notes (Signed)
Subjective: Able to eat some breakfast this morning. Denies abdominal pain, nausea, vomiting. Had 2 BMs this morning, more diarrhea in consistency. Denies rectal bleeding or melena. A/ox4.   Objective: Vital signs in last 24 hours: Temp:  [98.5 F (36.9 C)-100 F (37.8 C)] 98.8 F (37.1 C) (02/07 0425) Pulse Rate:  [107-115] 115 (02/07 0425) Resp:  [18] 18 (02/07 0425) BP: (110-117)/(62-72) 117/62 (02/07 0425) SpO2:  [98 %-100 %] 98 % (02/07 0425) Weight:  [58.2 kg] 58.2 kg (02/07 0421) Last BM Date : 12/22/22 General:   Alert and oriented, pleasant Head:  Normocephalic and atraumatic. Eyes: +scleral icterus   Mouth:  Without lesions, mucosa pink and moist.  Heart:  S1, S2 present, no murmurs noted.  Lungs: Clear to auscultation bilaterally, without wheezing, rales, or rhonchi.  Abdomen:  Bowel sounds present,  non-tender. Abdomen moderately full but soft and non taut. No HSM or hernias noted. No rebound or guarding. No masses appreciated  Msk:  Symmetrical without gross deformities. Normal posture. Pulses:  Normal pulses noted. Extremities:  Without clubbing or edema. Neurologic:  Alert and  oriented x4;  grossly normal neurologically. No asterixis  Skin:  Warm and dry, intact without significant lesions.  Psych:  Alert and cooperative. Normal mood and affect.  Intake/Output from previous day: 02/06 0701 - 02/07 0700 In: 480 [P.O.:480] Out: -  Intake/Output this shift: Total I/O In: 120 [P.O.:120] Out: -   Lab Results: Recent Labs    12/21/22 0428 12/22/22 0438 12/22/22 0824 12/23/22 0418  WBC 8.3 8.1  --  8.8  HGB 7.6* 7.1* 7.2* 7.2*  HCT 23.8* 22.3* 22.4* 22.1*  PLT 280 252  --  272   BMET Recent Labs    12/21/22 0428 12/22/22 0438 12/23/22 0418  NA 136 137 135  K 2.7* 2.8* 2.8*  CL 105 104 100  CO2 21* 23 24  GLUCOSE 84 78 87  BUN '13 9 8  '$ CREATININE 1.05* 0.77 0.66  CALCIUM 8.7* 9.0 9.1   LFT Recent Labs    12/21/22 0428 12/22/22 0438  12/23/22 0418  PROT 6.4* 6.4* 6.6  ALBUMIN 2.7* 3.1* 2.9*  AST 31 31 37  ALT '15 15 13  '$ ALKPHOS 86 81 77  BILITOT 2.9* 2.8* 2.6*  BILIDIR 1.6*  --   --    PT/INR Recent Labs    12/21/22 1332 12/22/22 0438  LABPROT 20.3* 19.6*  INR 1.8* 1.7*    Studies/Results: [place] ASCITES (ABDOMEN LIMITED)  Result Date: 12/22/2022 CLINICAL DATA:  Ascites, post paracentesis 1 day ago, abdominal distension EXAM: LIMITED ABDOMEN ULTRASOUND FOR ASCITES TECHNIQUE: Limited ultrasound survey for ascites was performed in all four abdominal quadrants. COMPARISON:  12/21/2022 FINDINGS: Small volume ascites identified perihepatic and LEFT upper quadrant. Only a small volume of ascites is seen in the RIGHT lower quadrant, none LEFT lower quadrant. Volume of ascites is insufficient for expected therapeutic benefit from paracentesis. IMPRESSION: Small volume ascites, insufficient for expected therapeutic benefit from paracentesis. If abdominal distension increases over subsequent days, recommend recheck for free accumulation of ascites. Electronically Signed   By: Lavonia Dana M.D.   On: 12/22/2022 11:21   ECHOCARDIOGRAM COMPLETE  Result Date: 12/21/2022    ECHOCARDIOGRAM REPORT   Patient Name:   Mallory Mendoza Date of Exam: 12/21/2022 Medical Rec #:  [id number]       Height:       64.0 in Accession #:    [id number]      Weight:  142.4 lb Date of Birth:  Mar 25, 1981       BSA:          1.693 m Patient Age:    41 years        BP:           103/73 mmHg Patient Gender: F               HR:           101 bpm. Exam Location:  [facility] Procedure: 2D Echo, Cardiac Doppler, Color Doppler and 3D Echo Indications:    CHF-Acute Diastolic Y78.29  History:        Patient has no prior history of Echocardiogram examinations.                 Signs/Symptoms:Shortness of Breath; Risk Factors:Current Smoker.  Sonographer:    Greer Pickerel Referring Phys: [id number] OLADAPO ADEFESO  Sonographer Comments: Image acquisition challenging due to  patient body habitus. Global longitudinal strain was attempted. IMPRESSIONS  1. Left ventricular ejection fraction, by estimation, is 65 to 70%. The left ventricle has normal function. The left ventricle has no regional wall motion abnormalities. Left ventricular diastolic parameters were normal.  2. Right ventricular systolic function is normal. The right ventricular size is normal. Tricuspid regurgitation signal is inadequate for assessing PA pressure.  3. Left atrial size was mildly dilated.  4. The mitral valve is normal in structure. No evidence of mitral valve regurgitation. No evidence of mitral stenosis.  5. The tricuspid valve is abnormal.  6. The aortic valve is tricuspid. Aortic valve regurgitation is not visualized. No aortic stenosis is present. FINDINGS  Left Ventricle: Left ventricular ejection fraction, by estimation, is 65 to 70%. The left ventricle has normal function. The left ventricle has no regional wall motion abnormalities. The left ventricular internal cavity size was normal in size. There is  no left ventricular hypertrophy. Left ventricular diastolic parameters were normal. Right Ventricle: The right ventricular size is normal. Right vetricular wall thickness was not well visualized. Right ventricular systolic function is normal. Tricuspid regurgitation signal is inadequate for assessing PA pressure. Left Atrium: Left atrial size was mildly dilated. Right Atrium: Right atrial size was normal in size. Pericardium: There is no evidence of pericardial effusion. Mitral Valve: The mitral valve is normal in structure. No evidence of mitral valve regurgitation. No evidence of mitral valve stenosis. Tricuspid Valve: The tricuspid valve is abnormal. Tricuspid valve regurgitation is mild . No evidence of tricuspid stenosis. Aortic Valve: The aortic valve is tricuspid. Aortic valve regurgitation is not visualized. No aortic stenosis is present. Aortic valve mean gradient measures 9.6 mmHg. Aortic  valve peak gradient measures 21.6 mmHg. Aortic valve area, by VTI measures 1.74  cm. Pulmonic Valve: The pulmonic valve was not well visualized. Pulmonic valve regurgitation is not visualized. No evidence of pulmonic stenosis. Aorta: The aortic root is normal in size and structure. IAS/Shunts: No atrial level shunt detected by color flow Doppler.  LEFT VENTRICLE PLAX 2D LVIDd:         4.30 cm   Diastology LVIDs:         2.50 cm   LV e' medial:    11.00 cm/s LV PW:         0.90 cm   LV E/e' medial:  11.5 LV IVS:        0.70 cm   LV e' lateral:   13.40 cm/s LVOT diam:     1.90 cm  LV E/e' lateral: 9.5 LV SV:         60 LV SV Index:   36 LVOT Area:     2.84 cm                           3D Volume EF:                          3D EF:        60 %                          LV EDV:       125 ml                          LV ESV:       50 ml                          LV SV:        75 ml RIGHT VENTRICLE RV S prime:     16.70 cm/s TAPSE (M-mode): 2.4 cm LEFT ATRIUM             Index        RIGHT ATRIUM           Index LA diam:        4.00 cm 2.36 cm/m   RA Area:     18.60 cm LA Vol (A2C):   73.6 ml 43.46 ml/m  RA Volume:   52.30 ml  30.88 ml/m LA Vol (A4C):   51.9 ml 30.65 ml/m LA Biplane Vol: 61.8 ml 36.49 ml/m  AORTIC VALVE AV Area (Vmax):    1.51 cm AV Area (Vmean):   1.60 cm AV Area (VTI):     1.74 cm AV Vmax:           232.38 cm/s AV Vmean:          144.377 cm/s AV VTI:            0.347 m AV Peak Grad:      21.6 mmHg AV Mean Grad:      9.6 mmHg LVOT Vmax:         124.00 cm/s LVOT Vmean:        81.500 cm/s LVOT VTI:          0.213 m LVOT/AV VTI ratio: 0.61  AORTA Ao Root diam: 3.10 cm Ao Asc diam:  3.10 cm MITRAL VALVE MV Area (PHT): 4.86 cm     SHUNTS MV Decel Time: 156 msec     Systemic VTI:  0.21 m MV E velocity: 127.00 cm/s  Systemic Diam: 1.90 cm MV A velocity: 115.00 cm/s MV E/A ratio:  1.10 Carlyle Dolly MD Electronically signed by Carlyle Dolly MD Signature Date/Time: 12/21/2022/12:30:57 PM    Final     US Paracentesis  Result Date: 12/21/2022 INDICATION: Biliary cirrhosis, ascites EXAM: ULTRASOUND GUIDED DIAGNOSTIC AND THERAPEUTIC PARACENTESIS MEDICATIONS: None. COMPLICATIONS: None immediate. PROCEDURE: Informed written consent was obtained from the patient after a discussion of the risks, benefits and alternatives to treatment. A timeout was performed prior to the initiation of the procedure. Initial ultrasound scanning demonstrates a moderate amount of ascites within the right lower abdominal quadrant. The right lower abdomen was prepped and draped in the usual sterile fashion. 1% lidocaine  was used for local anesthesia. Following this, a 19 gauge, 7-cm, Yueh catheter was introduced. An ultrasound image was saved for documentation purposes. The paracentesis was performed. The catheter was removed and a dressing was applied. The patient tolerated the procedure well without immediate post procedural complication. FINDINGS: A total of approximately 2.8 L of bright yellow fluid was removed. Samples were sent to the laboratory as requested by the clinical team. IMPRESSION: Successful ultrasound-guided paracentesis yielding 2.8 liters of peritoneal fluid. Electronically Signed   By: Lavonia Dana M.D.   On: 12/21/2022 11:25    Assessment: Lilana K. 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   Decompensated cirrhosis: No ETOH since November 2023. MELD 3.0 is 19 today, up from 18 yesterday though low albumin likely influencing this (using INR from yesterday, should repeat this daily).  First ever paracentesis Monday yielded 2.8 L.  Negative for SBP.   Portal vein patent on CT.  Outpatient diuretic regimen included Lasix 20 mg twice daily and Aldactone 25 mg twice daily. Reports non-compliance with 2g sodium diet restriction which was likely etiology of worsening ascites/peripheral edema.  presenting with persistent hypokalemia, K+ 2.8 today, currently receiving IV runs of potassium.  Lasix changed to 40 mg daily and aldactone increased to 100 mg daily yesterday due to ongoing hypokalemia. Abdomen is mildly full today, but soft and non taut. Will need outpatient EGD for variceal screening.    Ammonia slightly elevated at 41, yesterday 70 today though no confusion or mental status changes.  no asterixis. Do not recommend following ammonia unless she presents with changes in mental status. continue current xifaxan and titrate lactulose to 3-4 soft BMs per day.   Anemia: hgb 8.4 on admission, 7.2 today, and in the 7 range for the past few days. no overt GI bleeding. No iron deficiency. Repeat EGD as outpatient   History of PBC: off Urso outpatient, timing unknown. Urso retarted this admission  Hypokalemia: K+ remains 2.8 this morning. Diuretics adjusted yesterday with increase in spironolactone and decrease in lasix to help with potassium balance. Currently receiving IV runs of K+. Continued Potassium replacement per hospitalist    Plan: -Lasix 40 mg daily. -spironolactone to 100 mg daily -Potassium replacement per hospitalist. -Continue lactulose and Xifaxan, titrate lactulose to 3-4 BMs/ day -Strict 2 g sodium diet. -Consider repeat paracentesis this admission prior to discharge if needed. -Monitor H&H and for overt GI bleeding.  Transfuse if hemoglobin <7 -Continue ursodiol 300 mg TID -PPI BID -MELD labs (CMP and INR) daily. -EGD outpatient for variceal screening. -Continue alcohol cessation.    LOS: 3 days    12/23/2022, 8:55 AM   Shantel Helwig L. Alver Sorrow, MSN, APRN, AGNP-C Adult-Gerontology Nurse Practitioner [facility] at  [facility]

## 2022-12-24 ENCOUNTER — Encounter (HOSPITAL_COMMUNITY): Payer: Medicaid Other | Admitting: Physical Therapy

## 2022-12-26 LAB — CULTURE, BODY FLUID W GRAM STAIN -BOTTLE
Culture: NO GROWTH
Special Requests: ADEQUATE

## 2022-12-28 DIAGNOSIS — G4700 Insomnia, unspecified: Secondary | ICD-10-CM | POA: Diagnosis not present

## 2022-12-28 DIAGNOSIS — J189 Pneumonia, unspecified organism: Secondary | ICD-10-CM | POA: Diagnosis not present

## 2022-12-28 DIAGNOSIS — G629 Polyneuropathy, unspecified: Secondary | ICD-10-CM | POA: Diagnosis not present

## 2022-12-28 DIAGNOSIS — F411 Generalized anxiety disorder: Secondary | ICD-10-CM | POA: Diagnosis not present

## 2022-12-29 ENCOUNTER — Telehealth (HOSPITAL_COMMUNITY): Payer: Self-pay | Admitting: Physical Therapy

## 2022-12-29 ENCOUNTER — Encounter (HOSPITAL_COMMUNITY): Payer: Medicaid Other | Admitting: Physical Therapy

## 2022-12-29 NOTE — Telephone Encounter (Signed)
Pt did not show for appt.  Today is second consecutive NS (NS last visit 2/8) but third total as she also NS on 1/22.  Unable to reach by phone and no VM available.  Pt has one visit remaining and if DNS will need to be discharged per policy.  Teena Irani, PTA/CLT [place] Ph: [phone]

## 2022-12-31 ENCOUNTER — Ambulatory Visit (INDEPENDENT_AMBULATORY_CARE_PROVIDER_SITE_OTHER): Payer: Medicaid Other | Admitting: Internal Medicine

## 2022-12-31 ENCOUNTER — Telehealth: Payer: Self-pay | Admitting: *Deleted

## 2022-12-31 ENCOUNTER — Other Ambulatory Visit: Payer: Self-pay | Admitting: *Deleted

## 2022-12-31 ENCOUNTER — Encounter: Payer: Self-pay | Admitting: Internal Medicine

## 2022-12-31 VITALS — BP 100/66 | HR 105 | Temp 97.4°F | Ht 64.0 in | Wt 135.2 lb

## 2022-12-31 DIAGNOSIS — R140 Abdominal distension (gaseous): Secondary | ICD-10-CM

## 2022-12-31 DIAGNOSIS — K7682 Hepatic encephalopathy: Secondary | ICD-10-CM

## 2022-12-31 DIAGNOSIS — K7031 Alcoholic cirrhosis of liver with ascites: Secondary | ICD-10-CM | POA: Diagnosis not present

## 2022-12-31 DIAGNOSIS — K766 Portal hypertension: Secondary | ICD-10-CM | POA: Diagnosis not present

## 2022-12-31 DIAGNOSIS — E876 Hypokalemia: Secondary | ICD-10-CM
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
## 2022-12-31 NOTE — Telephone Encounter (Signed)
LMTRC  Paracentesis scheduled for 01/04/23 at 1 pm, arrive at 12:45 pm Also need to schedule EGD ASA 3 w/Dr.Carver.

## 2022-12-31 NOTE — Patient Instructions (Signed)
For the increased fluid in your abdomen, I will order paracentesis today.  They will call you to set this up.  We will schedule you for upper endoscopy for esophageal variceal screening.  Continue on lactulose and Xifaxan for your history of hepatic encephalopathy.  Continue Lasix and Aldactone at current doses.  I will try to hunt down your blood work yesterday.  We will call you if any changes need to be made or more blood work needs to be drawn.  Follow-up after upper endoscopy.  It was very nice seeing both you today.  Dr. Abbey Chatters

## 2023-01-01 ENCOUNTER — Encounter: Payer: Self-pay | Admitting: *Deleted

## 2023-01-01 NOTE — Telephone Encounter (Signed)
Pt informed that paracentesis is scheduled for 01/04/23, arrive at 10:45 am for 11 am procedure time. EGD scheduled for 01/29/23, instructions mailed to pt.

## 2023-01-04 ENCOUNTER — Ambulatory Visit (HOSPITAL_COMMUNITY): Admission: RE | Admit: 2023-01-04 | Payer: Medicaid Other | Source: Ambulatory Visit

## 2023-01-04 ENCOUNTER — Encounter (HOSPITAL_COMMUNITY): Payer: Self-pay

## 2023-01-04 ENCOUNTER — Ambulatory Visit (HOSPITAL_COMMUNITY)
Admission: RE | Admit: 2023-01-04 | Discharge: 2023-01-04 | Disposition: A | Discharge status: Home / Self Care | Payer: Medicaid Other | Source: Ambulatory Visit | Attending: Internal Medicine | Admitting: Internal Medicine

## 2023-01-04 ENCOUNTER — Encounter: Payer: Self-pay | Admitting: *Deleted

## 2023-01-04 DIAGNOSIS — K7031 Alcoholic cirrhosis of liver with ascites: Secondary | ICD-10-CM | POA: Insufficient documentation

## 2023-01-04 DIAGNOSIS — R188 Other ascites: Secondary | ICD-10-CM | POA: Diagnosis not present

## 2023-01-04 LAB — BODY FLUID CELL COUNT WITH DIFFERENTIAL
Eos, Fluid: 0 %
Lymphs, Fluid: 39 %
Monocyte-Macrophage-Serous Fluid: 44 % — ABNORMAL LOW (ref 50–90)
Neutrophil Count, Fluid: 17 % (ref 0–25)
Total Nucleated Cell Count, Fluid: 408 cu mm (ref 0–1000)

## 2023-01-04 LAB — GRAM STAIN

## 2023-01-04 MED ORDER — ALBUMIN HUMAN 25 % IV SOLN
0.0000 g | Freq: Once | INTRAVENOUS | Status: DC
  Filled 2023-01-04: qty 400

## 2023-01-04 NOTE — Progress Notes (Signed)
Patient tolerated right sided paracentesis procedure well today and 1.6 Liters of ascites removed with labs collected and sent for processing. PT verbalized understanding of discharge instructions and ambulatory with walker at departure with no acute distress noted.

## 2023-01-04 NOTE — Procedures (Signed)
PreOperative Dx: Alcoholic cirrhosis, ascites Postoperative Dx: Alcoholic cirrhosis, ascites Procedure:   US guided paracentesis Radiologist:  Thornton Papas Anesthesia:  10 ml of 1% lidocaine Specimen:  1.6 L of yellow ascitic fluid EBL:   < 1 ml Complications:  None

## 2023-01-07 ENCOUNTER — Encounter (HOSPITAL_COMMUNITY): Payer: Medicaid Other | Admitting: Physical Therapy

## 2023-01-09 LAB — CULTURE, BODY FLUID W GRAM STAIN -BOTTLE: Culture: NO GROWTH

## 2023-01-12 ENCOUNTER — Encounter: Payer: Self-pay | Admitting: Orthopaedic Surgery

## 2023-01-12 ENCOUNTER — Ambulatory Visit: Payer: Medicaid Other | Admitting: Orthopaedic Surgery

## 2023-01-12 VITALS — BP 121/75 | HR 107 | Ht 64.0 in | Wt 131.0 lb

## 2023-01-12 DIAGNOSIS — G5793 Unspecified mononeuropathy of bilateral lower limbs: Secondary | ICD-10-CM | POA: Diagnosis not present

## 2023-01-12 DIAGNOSIS — K7011 Alcoholic hepatitis with ascites: Secondary | ICD-10-CM

## 2023-01-12 NOTE — Patient Instructions (Addendum)
Steps to Quit Smoking Smoking tobacco is the leading cause of preventable death. It can affect almost every organ in the body. Smoking puts you and people around you at risk for many serious, long-lasting (chronic) diseases. Quitting smoking can be hard, but it is one of the best things that you can do for your health. It is never too late to quit. Do not give up if you cannot quit the first time. Some people need to try many times to quit. Do your best to stick to your quit plan, and talk with your doctor if you have any questions or concerns. How do I get ready to quit? Pick a date to quit. Set a date within the next 2 weeks to give you time to prepare. Write down the reasons why you are quitting. Keep this list in places where you will see it often. Tell your family, friends, and co-workers that you are quitting. Their support is important. Talk with your doctor about the choices that may help you quit. Find out if your health insurance will pay for these treatments. Know the people, places, things, and activities that make you want to smoke (triggers). Avoid them. What first steps can I take to quit smoking? Throw away all cigarettes at home, at work, and in your car. Throw away the things that you use when you smoke, such as ashtrays and lighters. Clean your car. Empty the ashtray. Clean your home, including curtains and carpets. What can I do to help me quit smoking? Talk with your doctor about taking medicines and seeing a counselor. You are more likely to succeed when you do both. If you are pregnant or breastfeeding: Talk with your doctor about counseling or other ways to quit smoking. Do not take medicine to help you quit smoking unless your doctor tells you to. Quit right away Quit smoking completely, instead of slowly cutting back on how much you smoke over a period of time. Stopping smoking right away may be more successful than slowly quitting. Go to counseling. In-person is best  if this is an option. You are more likely to quit if you go to counseling sessions regularly. Take medicine You may take medicines to help you quit. Some medicines need a prescription, and some you can buy over-the-counter. Some medicines may contain a drug called nicotine to replace the nicotine in cigarettes. Medicines may: Help you stop having the desire to smoke (cravings). Help to stop the problems that come when you stop smoking (withdrawal symptoms). Your doctor may ask you to use: Nicotine patches, gum, or lozenges. Nicotine inhalers or sprays. Non-nicotine medicine that you take by mouth. Find resources Find resources and other ways to help you quit smoking and remain smoke-free after you quit. They include: Online chats with a counselor. Phone quitlines. Printed self-help materials. Support groups or group counseling. Text messaging programs. Mobile phone apps. Use apps on your mobile phone or tablet that can help you stick to your quit plan. Examples of free services include Quit Guide from the CDC and smokefree.gov  What can I do to make it easier to quit?  Talk to your family and friends. Ask them to support and encourage you. Call a phone quitline, such as 1-800-QUIT-NOW, reach out to support groups, or work with a counselor. Ask people who smoke to not smoke around you. Avoid places that make you want to smoke, such as: Bars. Parties. Smoke-break areas at work. Spend time with people who do not smoke. Lower   the stress in your life. Stress can make you want to smoke. Try these things to lower stress: Getting regular exercise. Doing deep-breathing exercises. Doing yoga. Meditating. What benefits will I see if I quit smoking? Over time, you may have: A better sense of smell and taste. Less coughing and sore throat. A slower heart rate. Lower blood pressure. Clearer skin. Better breathing. Fewer sick days. Summary Quitting smoking can be hard, but it is one of  the best things that you can do for your health. Do not give up if you cannot quit the first time. Some people need to try many times to quit. When you decide to quit smoking, make a plan to help you succeed. Quit smoking right away, not slowly over a period of time. When you start quitting, get help and support to keep you smoke-free. This information is not intended to replace advice given to you by your health care provider. Make sure you discuss any questions you have with your health care provider. Document Revised: 10/24/2021 Document Reviewed: 10/24/2021 Elsevier Patient Education  2023 Elsevier Inc.  
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
## 2023-01-25 NOTE — Patient Instructions (Addendum)
KHALIDAH SEGRETI  01/25/2023     '@PREFPERIOPPHARMACY'$ @   Your procedure is scheduled on  01/29/2023.   Report to [facility] at  [place].M.   Call this number if you have problems the morning of surgery:  [phone]  If you experience any cold or flu symptoms such as cough, fever, chills, shortness of breath, etc. between now and your scheduled surgery, please notify us at the above number.   Remember:  Follow the diet instructions given to you by the office.     Take these medicines the morning of surgery with A SIP OF WATER         gabapentin, atarax, metoprolol, zofran (if needed), pantoprazole, xifaxan, ursodilol.     Do not wear jewelry, make-up or nail polish.  Do not wear lotions, powders, or perfumes, or deodorant.  Do not shave 48 hours prior to surgery.  Men may shave face and neck.  Do not bring valuables to the hospital.  [facility] is not responsible for any belongings or valuables.  Contacts, dentures or bridgework may not be worn into surgery.  Leave your suitcase in the car.  After surgery it may be brought to your room.  For patients admitted to the hospital, discharge time will be determined by your treatment team.  Patients discharged the day of surgery will not be allowed to drive home and must have someone with them for 24 hours.    Special instructions:   DO NOT smoke tobacco or vape for 24 hours before your procedure.  Please read over the following fact sheets that you were given. Anesthesia Post-op Instructions and Care and Recovery After Surgery       Upper Endoscopy, Adult, Care After After the procedure, it is common to have a sore throat. It is also common to have: Mild stomach pain or discomfort. Bloating. Nausea. Follow these instructions at home: The instructions below may help you care for yourself at home. Your health care provider may give you more instructions. If you have questions, ask your health care provider. If you  were given a sedative during the procedure, it can affect you for several hours. Do not drive or operate machinery until your health care provider says that it is safe. If you will be going home right after the procedure, plan to have a responsible adult: Take you home from the hospital or clinic. You will not be allowed to drive. Care for you for the time you are told. Follow instructions from your health care provider about what you may eat and drink. Return to your normal activities as told by your health care provider. Ask your health care provider what activities are safe for you. Take over-the-counter and prescription medicines only as told by your health care provider. Contact a health care provider if you: Have a sore throat that lasts longer than one day. Have trouble swallowing. Have a fever. Get help right away if you: Vomit blood or your vomit looks like coffee grounds. Have bloody, black, or tarry stools. Have a very bad sore throat or you cannot swallow. Have difficulty breathing or very bad pain in your chest or abdomen. These symptoms may be an emergency. Get help right away. Call 911. Do not wait to see if the symptoms will go away. Do not drive yourself to the hospital. Summary After the procedure, it is common to have a sore throat, mild stomach discomfort, bloating, and nausea. If you  were given a sedative during the procedure, it can affect you for several hours. Do not drive until your health care provider says that it is safe. Follow instructions from your health care provider about what you may eat and drink. Return to your normal activities as told by your health care provider. This information is not intended to replace advice given to you by your health care provider. Make sure you discuss any questions you have with your health care provider. Document Revised: 02/11/2022 Document Reviewed: 02/11/2022 Elsevier Patient Education  [place] After The following information offers guidance on how to care for yourself after your procedure. Your health care provider may also give you more specific instructions. If you have problems or questions, contact your health care provider. What can I expect after the procedure? After the procedure, it is common to have: Tiredness. Little or no memory about what happened during or after the procedure. Impaired judgment when it comes to making decisions. Nausea or vomiting. Some trouble with balance. Follow these instructions at home: For the time period you were told by your health care provider:  Rest. Do not participate in activities where you could fall or become injured. Do not drive or use machinery. Do not drink alcohol. Do not take sleeping pills or medicines that cause drowsiness. Do not make important decisions or sign legal documents. Do not take care of children on your own. Medicines Take over-the-counter and prescription medicines only as told by your health care provider. If you were prescribed antibiotics, take them as told by your health care provider. Do not stop using the antibiotic even if you start to feel better. Eating and drinking Follow instructions from your health care provider about what you may eat and drink. Drink enough fluid to keep your urine pale yellow. If you vomit: Drink clear fluids slowly and in small amounts as you are able. Clear fluids include water, ice chips, low-calorie sports drinks, and fruit juice that has water added to it (diluted fruit juice). Eat light and bland foods in small amounts as you are able. These foods include bananas, applesauce, rice, lean meats, toast, and crackers. General instructions  Have a responsible adult stay with you for the time you are told. It is important to have someone help care for you until you are awake and alert. If you have sleep apnea, surgery and some medicines can increase your  risk for breathing problems. Follow instructions from your health care provider about wearing your sleep device: When you are sleeping. This includes during daytime naps. While taking prescription pain medicines, sleeping medicines, or medicines that make you drowsy. Do not use any products that contain nicotine or tobacco. These products include cigarettes, chewing tobacco, and vaping devices, such as e-cigarettes. If you need help quitting, ask your health care provider. Contact a health care provider if: You feel nauseous or vomit every time you eat or drink. You feel light-headed. You are still sleepy or having trouble with balance after 24 hours. You get a rash. You have a fever. You have redness or swelling around the IV site. Get help right away if: You have trouble breathing. You have new confusion after you get home. These symptoms may be an emergency. Get help right away. Call 911. Do not wait to see if the symptoms will go away. Do not drive yourself to the hospital. This information is not intended to replace advice given to you by your health  care provider. Make sure you discuss any questions you have with your health care provider. Document Revised: 03/30/2022 Document Reviewed: 03/30/2022 Elsevier Patient Education  [place].

## 2023-01-26 DIAGNOSIS — R2681 Unsteadiness on feet: Secondary | ICD-10-CM | POA: Diagnosis not present

## 2023-01-26 DIAGNOSIS — Z720 Tobacco use: Secondary | ICD-10-CM | POA: Diagnosis not present

## 2023-01-26 DIAGNOSIS — Z0001 Encounter for general adult medical examination with abnormal findings: Secondary | ICD-10-CM | POA: Diagnosis not present

## 2023-01-26 DIAGNOSIS — F17210 Nicotine dependence, cigarettes, uncomplicated: Secondary | ICD-10-CM | POA: Diagnosis not present

## 2023-01-26 DIAGNOSIS — G629 Polyneuropathy, unspecified: Secondary | ICD-10-CM | POA: Diagnosis not present
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
## 2023-01-27 ENCOUNTER — Telehealth (INDEPENDENT_AMBULATORY_CARE_PROVIDER_SITE_OTHER): Payer: Self-pay | Admitting: *Deleted

## 2023-01-27 ENCOUNTER — Encounter (HOSPITAL_COMMUNITY): Payer: Self-pay

## 2023-01-27 ENCOUNTER — Ambulatory Visit: Payer: Medicaid Other | Admitting: Gastroenterology

## 2023-01-27 ENCOUNTER — Encounter (HOSPITAL_COMMUNITY)
Admission: RE | Admit: 2023-01-27 | Discharge: 2023-01-27 | Disposition: A | Discharge status: Home / Self Care | Payer: Medicaid Other | Source: Ambulatory Visit | Attending: Internal Medicine | Admitting: Internal Medicine

## 2023-01-27 ENCOUNTER — Ambulatory Visit (HOSPITAL_COMMUNITY)
Admission: RE | Admit: 2023-01-27 | Discharge: 2023-01-27 | Disposition: A | Discharge status: Home / Self Care | Payer: Medicaid Other | Source: Ambulatory Visit | Attending: Gastroenterology | Admitting: Gastroenterology

## 2023-01-27 ENCOUNTER — Encounter: Payer: Self-pay | Admitting: Gastroenterology

## 2023-01-27 VITALS — BP 131/77 | HR 89 | Temp 97.8°F | Resp 18

## 2023-01-27 VITALS — BP 109/69 | HR 97 | Temp 97.9°F | Ht 64.0 in | Wt 134.4 lb

## 2023-01-27 DIAGNOSIS — E876 Hypokalemia: Secondary | ICD-10-CM

## 2023-01-27 DIAGNOSIS — Z01818 Encounter for other preprocedural examination: Secondary | ICD-10-CM

## 2023-01-27 DIAGNOSIS — K7031 Alcoholic cirrhosis of liver with ascites: Secondary | ICD-10-CM

## 2023-01-27 DIAGNOSIS — R188 Other ascites: Secondary | ICD-10-CM | POA: Diagnosis not present

## 2023-01-27 DIAGNOSIS — D638 Anemia in other chronic diseases classified elsewhere: Secondary | ICD-10-CM

## 2023-01-27 LAB — BODY FLUID CELL COUNT WITH DIFFERENTIAL
Eos, Fluid: 0 %
Lymphs, Fluid: 46 %
Monocyte-Macrophage-Serous Fluid: 43 % — ABNORMAL LOW (ref 50–90)
Neutrophil Count, Fluid: 11 % (ref 0–25)
Total Nucleated Cell Count, Fluid: 283 cu mm (ref 0–1000)

## 2023-01-27 LAB — GRAM STAIN: Gram Stain: NONE SEEN

## 2023-01-27 MED ORDER — ALBUMIN HUMAN 25 % IV SOLN
INTRAVENOUS | Status: AC
  Administered 2023-01-27: 25 g via INTRAVENOUS
  Filled 2023-01-27: qty 100

## 2023-01-27 MED ORDER — ALBUMIN HUMAN 25 % IV SOLN: 25.0000 g | Freq: Once | INTRAVENOUS | Status: AC

## 2023-01-27 NOTE — Telephone Encounter (Signed)
Pt in office and informed of pre-op appointment for tomorrow (01/28/23) at 1:15 pm. Pt verbalized understanding.

## 2023-01-27 NOTE — Telephone Encounter (Signed)
Pt is being seen by Cyril Mourning. Made her and Tammy R aware.

## 2023-01-27 NOTE — Procedures (Signed)
   Alcoholic Cirrhosis  US guided RLQ paracentesis 3.2 Liters yellow fluid Labs sent per MD  Tolerated well  EBL: scant

## 2023-01-27 NOTE — Progress Notes (Signed)
Patient tolerated right sided paracentesis procedure and 25G of IV albumin well today and 3.2 Liters of clear yellow ascites removed with labs collected and sent for processing. PT ambulatory with walker at discharge, verbalized understanding of discharge instructions with no acute distress noted.

## 2023-01-27 NOTE — Telephone Encounter (Signed)
-----   Message from Jacqulynn Cadet, RN sent at 01/27/2023 11:00 AM EDT ----- Regarding: No show Hey Mallory Mendoza! Mallory Mendoza didn't show up for her PAT today

## 2023-01-27 NOTE — Patient Instructions (Addendum)
We will arrange to have a paracentesis.  Your preop testing has been rescheduled for tomorrow.  Be sure to go to the city he can have your procedures completed on Friday.  Once I reviewed your blood work, we will likely increase your diuretics to help prevent recurrent ascites. For now, continue Lasix 40 mg daily and spironolactone 100 mg daily.   As we discussed, it is very important that you follow a strict low-sodium diet.  No more than 2000 mg of sodium per day.  This includes everything that you eat and drink. I recommend that you keep a dietary log over the next 3 or 4 days to help identify how much sodium you are taking in.  General nutrition recommendations:  High-protein diet from a primarily plant-based diet. Avoid red meat.  No raw or undercooked meat, seafood, or shellfish. Low-fat/cholesterol/carbohydrate diet.  You can decrease Laculose. The goal is for you to have 3-4 Bms everyday. Try decreasing to 30 ml in the morning and at lunch and 15 ml in the evening. You can decrease further if needed, but must always have 3-4 Bms daily.   Continue Xifaxan 550 mg twice daily.   Contiune Ursodiol.   We will follow-up with you in the office after procedures.  It was nice to meet you today!   Aliene Altes, PA-C [facility]

## 2023-01-28 ENCOUNTER — Emergency Department (HOSPITAL_COMMUNITY)
Admission: EM | Admit: 2023-01-28 | Discharge: 2023-01-28 | Discharge status: Against Medical Advice | Payer: Medicaid Other | Attending: Emergency Medicine | Admitting: Emergency Medicine

## 2023-01-28 ENCOUNTER — Telehealth: Payer: Self-pay | Admitting: *Deleted

## 2023-01-28 ENCOUNTER — Encounter (HOSPITAL_COMMUNITY)
Admission: RE | Admit: 2023-01-28 | Discharge: 2023-01-28 | Disposition: A | Discharge status: Home / Self Care | Payer: Medicaid Other | Source: Ambulatory Visit | Attending: Internal Medicine | Admitting: Internal Medicine

## 2023-01-28 ENCOUNTER — Encounter (HOSPITAL_COMMUNITY): Payer: Self-pay

## 2023-01-28 ENCOUNTER — Encounter: Payer: Self-pay | Admitting: Gastroenterology

## 2023-01-28 ENCOUNTER — Other Ambulatory Visit: Payer: Self-pay

## 2023-01-28 ENCOUNTER — Inpatient Hospital Stay (HOSPITAL_COMMUNITY)
Admission: EM | Admit: 2023-01-28 | Discharge: 2023-02-03 | DRG: 641 | Disposition: A | Discharge status: Home / Self Care | Payer: Medicaid Other | Attending: Internal Medicine | Admitting: Internal Medicine

## 2023-01-28 DIAGNOSIS — N179 Acute kidney failure, unspecified: Secondary | ICD-10-CM | POA: Diagnosis present

## 2023-01-28 DIAGNOSIS — K7682 Hepatic encephalopathy: Secondary | ICD-10-CM

## 2023-01-28 DIAGNOSIS — D638 Anemia in other chronic diseases classified elsewhere: Secondary | ICD-10-CM

## 2023-01-28 DIAGNOSIS — Z01812 Encounter for preprocedural laboratory examination: Secondary | ICD-10-CM | POA: Diagnosis not present

## 2023-01-28 DIAGNOSIS — E876 Hypokalemia: Secondary | ICD-10-CM | POA: Diagnosis present

## 2023-01-28 DIAGNOSIS — K7031 Alcoholic cirrhosis of liver with ascites: Secondary | ICD-10-CM | POA: Diagnosis present

## 2023-01-28 DIAGNOSIS — E871 Hypo-osmolality and hyponatremia: Secondary | ICD-10-CM | POA: Diagnosis present

## 2023-01-28 DIAGNOSIS — K7030 Alcoholic cirrhosis of liver without ascites: Secondary | ICD-10-CM | POA: Diagnosis present

## 2023-01-28 DIAGNOSIS — K652 Spontaneous bacterial peritonitis: Principal | ICD-10-CM

## 2023-01-28 DIAGNOSIS — I10 Essential (primary) hypertension: Secondary | ICD-10-CM | POA: Diagnosis not present

## 2023-01-28 DIAGNOSIS — R109 Unspecified abdominal pain: Secondary | ICD-10-CM | POA: Diagnosis present

## 2023-01-28 DIAGNOSIS — F1010 Alcohol abuse, uncomplicated: Secondary | ICD-10-CM | POA: Diagnosis present

## 2023-01-28 DIAGNOSIS — F419 Anxiety disorder, unspecified: Secondary | ICD-10-CM | POA: Diagnosis present

## 2023-01-28 DIAGNOSIS — K219 Gastro-esophageal reflux disease without esophagitis: Secondary | ICD-10-CM | POA: Diagnosis present

## 2023-01-28 DIAGNOSIS — Z5321 Procedure and treatment not carried out due to patient leaving prior to being seen by health care provider: Secondary | ICD-10-CM | POA: Diagnosis not present

## 2023-01-28 DIAGNOSIS — R079 Chest pain, unspecified: Secondary | ICD-10-CM | POA: Diagnosis not present

## 2023-01-28 DIAGNOSIS — K766 Portal hypertension: Secondary | ICD-10-CM | POA: Diagnosis present

## 2023-01-28 DIAGNOSIS — Z716 Tobacco abuse counseling: Secondary | ICD-10-CM

## 2023-01-28 DIAGNOSIS — R7881 Bacteremia: Secondary | ICD-10-CM

## 2023-01-28 DIAGNOSIS — Z79899 Other long term (current) drug therapy: Secondary | ICD-10-CM

## 2023-01-28 DIAGNOSIS — E8809 Other disorders of plasma-protein metabolism, not elsewhere classified: Secondary | ICD-10-CM | POA: Diagnosis present

## 2023-01-28 DIAGNOSIS — Z01818 Encounter for other preprocedural examination: Secondary | ICD-10-CM

## 2023-01-28 DIAGNOSIS — Z91119 Patient's noncompliance with dietary regimen due to unspecified reason: Secondary | ICD-10-CM

## 2023-01-28 DIAGNOSIS — F17210 Nicotine dependence, cigarettes, uncomplicated: Secondary | ICD-10-CM | POA: Diagnosis present

## 2023-01-28 DIAGNOSIS — G4700 Insomnia, unspecified: Secondary | ICD-10-CM | POA: Diagnosis present

## 2023-01-28 DIAGNOSIS — D509 Iron deficiency anemia, unspecified: Secondary | ICD-10-CM | POA: Diagnosis present

## 2023-01-28 DIAGNOSIS — R000 Tachycardia, unspecified: Secondary | ICD-10-CM | POA: Diagnosis not present

## 2023-01-28 DIAGNOSIS — F32A Depression, unspecified: Secondary | ICD-10-CM | POA: Diagnosis present

## 2023-01-28 DIAGNOSIS — Z8249 Family history of ischemic heart disease and other diseases of the circulatory system: Secondary | ICD-10-CM

## 2023-01-28 DIAGNOSIS — A599 Trichomoniasis, unspecified: Secondary | ICD-10-CM | POA: Diagnosis present

## 2023-01-28 LAB — COMPREHENSIVE METABOLIC PANEL
ALT: 9 U/L (ref 0–44)
ALT: 9 U/L (ref 0–44)
AST: 24 U/L (ref 15–41)
AST: 25 U/L (ref 15–41)
Albumin: 2.7 g/dL — ABNORMAL LOW (ref 3.5–5.0)
Albumin: 2.9 g/dL — ABNORMAL LOW (ref 3.5–5.0)
Alkaline Phosphatase: 53 U/L (ref 38–126)
Alkaline Phosphatase: 61 U/L (ref 38–126)
Anion gap: 8 (ref 5–15)
Anion gap: 6 (ref 5–15)
BUN: 15 mg/dL (ref 6–20)
BUN: 14 mg/dL (ref 6–20)
CO2: 24 mmol/L (ref 22–32)
CO2: 26 mmol/L (ref 22–32)
Calcium: 13.3 mg/dL (ref 8.9–10.3)
Calcium: 13.9 mg/dL (ref 8.9–10.3)
Chloride: 100 mmol/L (ref 98–111)
Chloride: 102 mmol/L (ref 98–111)
Creatinine, Ser: 1.22 mg/dL — ABNORMAL HIGH (ref 0.44–1.00)
Creatinine, Ser: 1.25 mg/dL — ABNORMAL HIGH (ref 0.44–1.00)
GFR, Estimated: 57 mL/min — ABNORMAL LOW (ref >60)
GFR, Estimated: 56 mL/min — ABNORMAL LOW (ref >60)
Glucose, Bld: 101 mg/dL — ABNORMAL HIGH (ref 70–99)
Glucose, Bld: 100 mg/dL — ABNORMAL HIGH (ref 70–99)
Potassium: 2.9 mmol/L — ABNORMAL LOW (ref 3.5–5.1)
Potassium: 3 mmol/L — ABNORMAL LOW (ref 3.5–5.1)
Sodium: 132 mmol/L — ABNORMAL LOW (ref 135–145)
Sodium: 134 mmol/L — ABNORMAL LOW (ref 135–145)
Total Bilirubin: 2.3 mg/dL — ABNORMAL HIGH (ref 0.3–1.2)
Total Bilirubin: 2.2 mg/dL — ABNORMAL HIGH (ref 0.3–1.2)
Total Protein: 7 g/dL (ref 6.5–8.1)
Total Protein: 7.5 g/dL (ref 6.5–8.1)

## 2023-01-28 LAB — CBC WITH DIFFERENTIAL/PLATELET
Abs Immature Granulocytes: 0.02 10*3/uL (ref 0.00–0.07)
Abs Immature Granulocytes: 0.02 10*3/uL (ref 0.00–0.07)
Basophils Absolute: 0 10*3/uL (ref 0.0–0.1)
Basophils Absolute: 0 10*3/uL (ref 0.0–0.1)
Basophils Relative: 0 %
Basophils Relative: 0 %
Eosinophils Absolute: 0.2 10*3/uL (ref 0.0–0.5)
Eosinophils Absolute: 0.3 10*3/uL (ref 0.0–0.5)
Eosinophils Relative: 3 %
Eosinophils Relative: 3 %
HCT: 26.9 % — ABNORMAL LOW (ref 36.0–46.0)
HCT: 28.3 % — ABNORMAL LOW (ref 36.0–46.0)
Hemoglobin: 8.8 g/dL — ABNORMAL LOW (ref 12.0–15.0)
Hemoglobin: 9.3 g/dL — ABNORMAL LOW (ref 12.0–15.0)
Immature Granulocytes: 0 %
Immature Granulocytes: 0 %
Lymphocytes Relative: 34 %
Lymphocytes Relative: 29 %
Lymphs Abs: 2.6 10*3/uL (ref 0.7–4.0)
Lymphs Abs: 2.9 10*3/uL (ref 0.7–4.0)
MCH: 31.9 pg (ref 26.0–34.0)
MCH: 32.1 pg (ref 26.0–34.0)
MCHC: 32.7 g/dL (ref 30.0–36.0)
MCHC: 32.9 g/dL (ref 30.0–36.0)
MCV: 97.5 fL (ref 80.0–100.0)
MCV: 97.6 fL (ref 80.0–100.0)
Monocytes Absolute: 0.7 10*3/uL (ref 0.1–1.0)
Monocytes Absolute: 0.8 10*3/uL (ref 0.1–1.0)
Monocytes Relative: 9 %
Monocytes Relative: 8 %
Neutro Abs: 4.2 10*3/uL (ref 1.7–7.7)
Neutro Abs: 5.9 10*3/uL (ref 1.7–7.7)
Neutrophils Relative %: 54 %
Neutrophils Relative %: 60 %
Platelets: 214 10*3/uL (ref 150–400)
Platelets: 233 10*3/uL (ref 150–400)
RBC: 2.76 MIL/uL — ABNORMAL LOW (ref 3.87–5.11)
RBC: 2.9 MIL/uL — ABNORMAL LOW (ref 3.87–5.11)
RDW: 16.1 % — ABNORMAL HIGH (ref 11.5–15.5)
RDW: 15.9 % — ABNORMAL HIGH (ref 11.5–15.5)
WBC: 7.7 10*3/uL (ref 4.0–10.5)
WBC: 9.9 10*3/uL (ref 4.0–10.5)
nRBC: 0 % (ref 0.0–0.2)
nRBC: 0 % (ref 0.0–0.2)

## 2023-01-28 LAB — POCT PREGNANCY, URINE: Preg Test, Ur: NEGATIVE

## 2023-01-28 LAB — PROTIME-INR
INR: 1.5 — ABNORMAL HIGH (ref 0.8–1.2)
Prothrombin Time: 18.3 seconds — ABNORMAL HIGH (ref 11.4–15.2)

## 2023-01-28 NOTE — ED Provider Triage Note (Signed)
Emergency Medicine Provider Triage Evaluation Note  Mallory Mendoza , a 42 y.o. female  was evaluated in triage.  Patient reports Dr. Abbey Chatters advised her to come to the emergency department because he was found to have infection in her peritoneal fluid.  Patient reports she had a paracentesis yesterday.  Patient reports she has cirrhosis and paracentesis was done for comfort.  Review of Systems  Positive: Abdominal discomfort  negative: fever  Physical Exam  BP 120/77 (BP Location: Right Arm)   Pulse 75   Temp 98.4 F (36.9 C) (Oral)   Resp 18   Ht '5\' 4"'$  (1.626 m)   Wt 60.8 kg   SpO2 100%   BMI 23.00 kg/m  Gen:   Awake, no distress   Resp:  Normal effort  MSK:   Moves extremities without difficulty  Other:    Medical Decision Making  Medically screening exam initiated at 9:39 PM.  Appropriate orders placed.  [name] was informed that the remainder of the evaluation will be completed by another provider, this initial triage assessment does not replace that evaluation, and the importance of remaining in the ED until their evaluation is complete.     [name], [place] 01/28/23 2141

## 2023-01-28 NOTE — Telephone Encounter (Signed)
Lab called and states pt has fluid that came back Gram positive cocci.

## 2023-01-28 NOTE — Telephone Encounter (Signed)
Tried to reach pt several times. Called daughter (DPR) informed her to tell her mother to go to ED for IV antibiotics. She voiced understanding.

## 2023-01-28 NOTE — ED Triage Notes (Signed)
Pt had a paracentesis yesterday at the doctors office and she was called today to come back due to infection

## 2023-01-28 NOTE — Pre-Procedure Instructions (Signed)
CBC and COMP panel routed to Dr Abbey Chatters.

## 2023-01-28 NOTE — Telephone Encounter (Signed)
Please call patient and let her know that the fluid that was drawn from her belly is showing some evidence of bacteria.  She needs to proceed to the emergency room today for admission as she needs IV antibiotics for treatment.

## 2023-01-28 NOTE — ED Triage Notes (Signed)
Pt decided to leave and call 911.  Called by pcp for infection in the paracentesis fluid. Hurting in the abdomen. Now hurting in the chest as well, took 4 baby asprin.

## 2023-01-28 NOTE — Telephone Encounter (Signed)
Spoke to pt, informed her of results and recommendations. Pt states she does not want to be in the waiting room. I informed her to go and get antibiotics. She said she would try and go today.

## 2023-01-28 NOTE — Telephone Encounter (Signed)
Yes, unfortunately, she will have to wait in the emergency room.  SBP requires admission to the hospital with IV antibiotics.

## 2023-01-28 NOTE — Telephone Encounter (Signed)
Noted  

## 2023-01-29 ENCOUNTER — Encounter (HOSPITAL_COMMUNITY)
Admission: EM | Disposition: A | Discharge status: Home / Self Care | Payer: Self-pay | Source: Home / Self Care | Attending: Family Medicine

## 2023-01-29 ENCOUNTER — Observation Stay (HOSPITAL_COMMUNITY): Payer: Medicaid Other | Admitting: Anesthesiology

## 2023-01-29 ENCOUNTER — Observation Stay (HOSPITAL_COMMUNITY): Payer: Medicaid Other

## 2023-01-29 ENCOUNTER — Telehealth: Payer: Self-pay | Admitting: Gastroenterology

## 2023-01-29 ENCOUNTER — Ambulatory Visit (HOSPITAL_COMMUNITY): Admission: RE | Admit: 2023-01-29 | Payer: Medicaid Other | Source: Home / Self Care

## 2023-01-29 ENCOUNTER — Encounter (HOSPITAL_COMMUNITY): Payer: Self-pay | Admitting: Family Medicine

## 2023-01-29 DIAGNOSIS — K7460 Unspecified cirrhosis of liver: Secondary | ICD-10-CM

## 2023-01-29 DIAGNOSIS — G4700 Insomnia, unspecified: Secondary | ICD-10-CM | POA: Diagnosis not present

## 2023-01-29 DIAGNOSIS — R7881 Bacteremia: Secondary | ICD-10-CM

## 2023-01-29 DIAGNOSIS — E8809 Other disorders of plasma-protein metabolism, not elsewhere classified: Secondary | ICD-10-CM | POA: Diagnosis not present

## 2023-01-29 DIAGNOSIS — F17210 Nicotine dependence, cigarettes, uncomplicated: Secondary | ICD-10-CM | POA: Diagnosis not present

## 2023-01-29 DIAGNOSIS — K766 Portal hypertension: Secondary | ICD-10-CM

## 2023-01-29 DIAGNOSIS — K219 Gastro-esophageal reflux disease without esophagitis: Secondary | ICD-10-CM

## 2023-01-29 DIAGNOSIS — K652 Spontaneous bacterial peritonitis: Principal | ICD-10-CM

## 2023-01-29 DIAGNOSIS — Z8249 Family history of ischemic heart disease and other diseases of the circulatory system: Secondary | ICD-10-CM | POA: Diagnosis not present

## 2023-01-29 DIAGNOSIS — E876 Hypokalemia: Secondary | ICD-10-CM | POA: Diagnosis not present

## 2023-01-29 DIAGNOSIS — K2970 Gastritis, unspecified, without bleeding: Secondary | ICD-10-CM | POA: Diagnosis not present

## 2023-01-29 DIAGNOSIS — F1010 Alcohol abuse, uncomplicated: Secondary | ICD-10-CM | POA: Diagnosis present

## 2023-01-29 DIAGNOSIS — K7031 Alcoholic cirrhosis of liver with ascites: Secondary | ICD-10-CM

## 2023-01-29 DIAGNOSIS — D509 Iron deficiency anemia, unspecified: Secondary | ICD-10-CM | POA: Diagnosis not present

## 2023-01-29 DIAGNOSIS — N289 Disorder of kidney and ureter, unspecified: Secondary | ICD-10-CM

## 2023-01-29 DIAGNOSIS — E871 Hypo-osmolality and hyponatremia: Secondary | ICD-10-CM | POA: Diagnosis not present

## 2023-01-29 DIAGNOSIS — R1084 Generalized abdominal pain: Secondary | ICD-10-CM | POA: Diagnosis not present

## 2023-01-29 DIAGNOSIS — Z13810 Encounter for screening for upper gastrointestinal disorder: Secondary | ICD-10-CM | POA: Diagnosis not present

## 2023-01-29 DIAGNOSIS — F418 Other specified anxiety disorders: Secondary | ICD-10-CM

## 2023-01-29 DIAGNOSIS — A599 Trichomoniasis, unspecified: Secondary | ICD-10-CM | POA: Diagnosis not present

## 2023-01-29 DIAGNOSIS — K7682 Hepatic encephalopathy: Secondary | ICD-10-CM | POA: Diagnosis not present

## 2023-01-29 DIAGNOSIS — Z91119 Patient's noncompliance with dietary regimen due to unspecified reason: Secondary | ICD-10-CM | POA: Diagnosis not present

## 2023-01-29 DIAGNOSIS — F419 Anxiety disorder, unspecified: Secondary | ICD-10-CM | POA: Diagnosis not present

## 2023-01-29 DIAGNOSIS — R109 Unspecified abdominal pain: Secondary | ICD-10-CM | POA: Diagnosis not present

## 2023-01-29 DIAGNOSIS — F32A Depression, unspecified: Secondary | ICD-10-CM | POA: Diagnosis not present

## 2023-01-29 DIAGNOSIS — Z716 Tobacco abuse counseling: Secondary | ICD-10-CM | POA: Diagnosis not present

## 2023-01-29 DIAGNOSIS — N179 Acute kidney failure, unspecified: Secondary | ICD-10-CM | POA: Diagnosis not present

## 2023-01-29 DIAGNOSIS — Z79899 Other long term (current) drug therapy: Secondary | ICD-10-CM | POA: Diagnosis not present

## 2023-01-29 HISTORY — PX: ESOPHAGOGASTRODUODENOSCOPY (EGD) WITH PROPOFOL: SHX5813

## 2023-01-29 LAB — URINALYSIS, ROUTINE W REFLEX MICROSCOPIC
Bilirubin Urine: NEGATIVE
Glucose, UA: NEGATIVE mg/dL
Hgb urine dipstick: NEGATIVE
Ketones, ur: NEGATIVE mg/dL
Nitrite: NEGATIVE
Specific Gravity, Urine: 1.015 (ref 1.005–1.030)
pH: 6 (ref 5.0–8.0)

## 2023-01-29 LAB — CBC WITH DIFFERENTIAL/PLATELET
Abs Immature Granulocytes: 0.02 10*3/uL (ref 0.00–0.07)
Basophils Absolute: 0 10*3/uL (ref 0.0–0.1)
Basophils Relative: 0 %
Eosinophils Absolute: 0.3 10*3/uL (ref 0.0–0.5)
Eosinophils Relative: 3 %
HCT: 26.6 % — ABNORMAL LOW (ref 36.0–46.0)
Hemoglobin: 8.8 g/dL — ABNORMAL LOW (ref 12.0–15.0)
Immature Granulocytes: 0 %
Lymphocytes Relative: 31 %
Lymphs Abs: 2.7 10*3/uL (ref 0.7–4.0)
MCH: 31.9 pg (ref 26.0–34.0)
MCHC: 33.1 g/dL (ref 30.0–36.0)
MCV: 96.4 fL (ref 80.0–100.0)
Monocytes Absolute: 0.9 10*3/uL (ref 0.1–1.0)
Monocytes Relative: 10 %
Neutro Abs: 4.8 10*3/uL (ref 1.7–7.7)
Neutrophils Relative %: 56 %
Platelets: 192 10*3/uL (ref 150–400)
RBC: 2.76 MIL/uL — ABNORMAL LOW (ref 3.87–5.11)
RDW: 15.9 % — ABNORMAL HIGH (ref 11.5–15.5)
WBC: 8.7 10*3/uL (ref 4.0–10.5)
nRBC: 0 % (ref 0.0–0.2)

## 2023-01-29 LAB — COMPREHENSIVE METABOLIC PANEL
ALT: 9 U/L (ref 0–44)
ALT: 10 U/L (ref 0–44)
ALT: 10 U/L (ref 0–44)
AST: 26 U/L (ref 15–41)
AST: 25 U/L (ref 15–41)
AST: 24 U/L (ref 15–41)
Albumin: 2.8 g/dL — ABNORMAL LOW (ref 3.5–5.0)
Albumin: 2.6 g/dL — ABNORMAL LOW (ref 3.5–5.0)
Albumin: 2.7 g/dL — ABNORMAL LOW (ref 3.5–5.0)
Alkaline Phosphatase: 60 U/L (ref 38–126)
Alkaline Phosphatase: 57 U/L (ref 38–126)
Alkaline Phosphatase: 50 U/L (ref 38–126)
Anion gap: 11 (ref 5–15)
Anion gap: 8 (ref 5–15)
Anion gap: 7 (ref 5–15)
BUN: 15 mg/dL (ref 6–20)
BUN: 14 mg/dL (ref 6–20)
BUN: 13 mg/dL (ref 6–20)
CO2: 23 mmol/L (ref 22–32)
CO2: 23 mmol/L (ref 22–32)
CO2: 24 mmol/L (ref 22–32)
Calcium: 13.5 mg/dL (ref 8.9–10.3)
Calcium: 12.8 mg/dL — ABNORMAL HIGH (ref 8.9–10.3)
Calcium: 11.9 mg/dL — ABNORMAL HIGH (ref 8.9–10.3)
Chloride: 101 mmol/L (ref 98–111)
Chloride: 105 mmol/L (ref 98–111)
Chloride: 104 mmol/L (ref 98–111)
Creatinine, Ser: 1.25 mg/dL — ABNORMAL HIGH (ref 0.44–1.00)
Creatinine, Ser: 1.08 mg/dL — ABNORMAL HIGH (ref 0.44–1.00)
Creatinine, Ser: 0.93 mg/dL (ref 0.44–1.00)
GFR, Estimated: 56 mL/min — ABNORMAL LOW (ref >60)
GFR, Estimated: >60 mL/min (ref >60)
GFR, Estimated: >60 mL/min (ref >60)
Glucose, Bld: 102 mg/dL — ABNORMAL HIGH (ref 70–99)
Glucose, Bld: 84 mg/dL (ref 70–99)
Glucose, Bld: 87 mg/dL (ref 70–99)
Potassium: 3.2 mmol/L — ABNORMAL LOW (ref 3.5–5.1)
Potassium: 3.4 mmol/L — ABNORMAL LOW (ref 3.5–5.1)
Potassium: 3.2 mmol/L — ABNORMAL LOW (ref 3.5–5.1)
Sodium: 135 mmol/L (ref 135–145)
Sodium: 136 mmol/L (ref 135–145)
Sodium: 135 mmol/L (ref 135–145)
Total Bilirubin: 2.1 mg/dL — ABNORMAL HIGH (ref 0.3–1.2)
Total Bilirubin: 1.9 mg/dL — ABNORMAL HIGH (ref 0.3–1.2)
Total Bilirubin: 1.9 mg/dL — ABNORMAL HIGH (ref 0.3–1.2)
Total Protein: 7.2 g/dL (ref 6.5–8.1)
Total Protein: 6.8 g/dL (ref 6.5–8.1)
Total Protein: 6.5 g/dL (ref 6.5–8.1)

## 2023-01-29 LAB — URINALYSIS, MICROSCOPIC (REFLEX): WBC, UA: >50 WBC/hpf (ref 0–5)

## 2023-01-29 LAB — MAGNESIUM: Magnesium: 1.3 mg/dL — ABNORMAL LOW (ref 1.7–2.4)

## 2023-01-29 LAB — CALCIUM: Calcium: 13.4 mg/dL (ref 8.9–10.3)

## 2023-01-29 LAB — PATHOLOGIST SMEAR REVIEW

## 2023-01-29 LAB — VITAMIN D 25 HYDROXY (VIT D DEFICIENCY, FRACTURES): Vit D, 25-Hydroxy: 18.32 ng/mL — ABNORMAL LOW (ref 30–100)

## 2023-01-29 LAB — SODIUM, URINE, RANDOM: Sodium, Ur: 54 mmol/L

## 2023-01-29 LAB — PHOSPHORUS: Phosphorus: 3.6 mg/dL (ref 2.5–4.6)

## 2023-01-29 LAB — LACTIC ACID, PLASMA: Lactic Acid, Venous: 1.4 mmol/L (ref 0.5–1.9)

## 2023-01-29 SURGERY — ESOPHAGOGASTRODUODENOSCOPY (EGD) WITH PROPOFOL
Anesthesia: General

## 2023-01-29 MED ORDER — NICOTINE 21 MG/24HR TD PT24
21.0000 mg | MEDICATED_PATCH | Freq: Every day | TRANSDERMAL | Status: DC
  Administered 2023-01-29 – 2023-02-03 (×6): 21 mg via TRANSDERMAL
  Filled 2023-01-29 (×6): qty 1

## 2023-01-29 MED ORDER — FUROSEMIDE 10 MG/ML IJ SOLN
40.0000 mg | Freq: Once | INTRAMUSCULAR | Status: AC
  Administered 2023-01-29: 40 mg via INTRAVENOUS
  Filled 2023-01-29: qty 4

## 2023-01-29 MED ORDER — OXYCODONE HCL 5 MG PO TABS
5.0000 mg | ORAL_TABLET | ORAL | Status: DC | PRN
  Administered 2023-01-29 – 2023-02-03 (×11): 5 mg via ORAL
  Filled 2023-01-29 (×11): qty 1

## 2023-01-29 MED ORDER — PROPOFOL 500 MG/50ML IV EMUL
INTRAVENOUS | Status: AC
  Filled 2023-01-29: qty 50

## 2023-01-29 MED ORDER — RIFAXIMIN 550 MG PO TABS
550.0000 mg | ORAL_TABLET | Freq: Two times a day (BID) | ORAL | Status: DC
  Administered 2023-01-29 – 2023-02-03 (×10): 550 mg via ORAL
  Filled 2023-01-29 (×10): qty 1

## 2023-01-29 MED ORDER — ONDANSETRON HCL 4 MG/2ML IJ SOLN: 4.0000 mg | Freq: Four times a day (QID) | INTRAMUSCULAR | Status: DC | PRN

## 2023-01-29 MED ORDER — HEPARIN SODIUM (PORCINE) 5000 UNIT/ML IJ SOLN
5000.0000 [IU] | Freq: Three times a day (TID) | INTRAMUSCULAR | Status: DC
  Administered 2023-01-29 – 2023-02-03 (×15): 5000 [IU] via SUBCUTANEOUS
  Filled 2023-01-29 (×14): qty 1

## 2023-01-29 MED ORDER — VANCOMYCIN HCL 1250 MG/250ML IV SOLN
1250.0000 mg | Freq: Once | INTRAVENOUS | Status: AC
  Administered 2023-01-29: 1250 mg via INTRAVENOUS
  Filled 2023-01-29: qty 250

## 2023-01-29 MED ORDER — FUROSEMIDE 40 MG PO TABS
40.0000 mg | ORAL_TABLET | Freq: Every day | ORAL | Status: DC
  Administered 2023-01-29 – 2023-02-02 (×5): 40 mg via ORAL
  Filled 2023-01-29 (×5): qty 1

## 2023-01-29 MED ORDER — ALBUMIN HUMAN 25 % IV SOLN
25.0000 g | Freq: Three times a day (TID) | INTRAVENOUS | Status: AC
  Administered 2023-01-29 (×3): 25 g via INTRAVENOUS
  Filled 2023-01-29: qty 100

## 2023-01-29 MED ORDER — ALBUMIN HUMAN 25 % IV SOLN
25.0000 g | Freq: Three times a day (TID) | INTRAVENOUS | Status: DC
  Filled 2023-01-29 (×5): qty 100

## 2023-01-29 MED ORDER — GABAPENTIN 100 MG PO CAPS
100.0000 mg | ORAL_CAPSULE | Freq: Two times a day (BID) | ORAL | Status: DC
  Administered 2023-01-29 – 2023-02-03 (×10): 100 mg via ORAL
  Filled 2023-01-29 (×10): qty 1

## 2023-01-29 MED ORDER — FOLIC ACID 1 MG PO TABS
1.0000 mg | ORAL_TABLET | Freq: Every day | ORAL | Status: DC
  Administered 2023-01-30 – 2023-02-03 (×5): 1 mg via ORAL
  Filled 2023-01-29 (×5): qty 1

## 2023-01-29 MED ORDER — METOPROLOL TARTRATE 25 MG PO TABS
25.0000 mg | ORAL_TABLET | Freq: Two times a day (BID) | ORAL | Status: DC
  Administered 2023-01-29: 25 mg via ORAL
  Filled 2023-01-29 (×2): qty 1

## 2023-01-29 MED ORDER — MORPHINE SULFATE (PF) 2 MG/ML IV SOLN
2.0000 mg | INTRAVENOUS | Status: DC | PRN
  Administered 2023-01-29: 2 mg via INTRAVENOUS
  Filled 2023-01-29: qty 1

## 2023-01-29 MED ORDER — PROPOFOL 10 MG/ML IV BOLUS
INTRAVENOUS | Status: DC | PRN
  Administered 2023-01-29 (×3): 50 mg via INTRAVENOUS

## 2023-01-29 MED ORDER — ACETAMINOPHEN 325 MG PO TABS
650.0000 mg | ORAL_TABLET | Freq: Four times a day (QID) | ORAL | Status: DC | PRN
  Administered 2023-02-02: 650 mg via ORAL
  Filled 2023-01-29: qty 2

## 2023-01-29 MED ORDER — POTASSIUM CHLORIDE CRYS ER 20 MEQ PO TBCR
40.0000 meq | EXTENDED_RELEASE_TABLET | Freq: Once | ORAL | Status: AC
  Administered 2023-01-29: 40 meq via ORAL
  Filled 2023-01-29: qty 2

## 2023-01-29 MED ORDER — ACETAMINOPHEN 650 MG RE SUPP: 650.0000 mg | Freq: Four times a day (QID) | RECTAL | Status: DC | PRN

## 2023-01-29 MED ORDER — LACTATED RINGERS IV SOLN: INTRAVENOUS | Status: DC

## 2023-01-29 MED ORDER — ONDANSETRON HCL 4 MG PO TABS: 4.0000 mg | ORAL_TABLET | Freq: Four times a day (QID) | ORAL | Status: DC | PRN

## 2023-01-29 MED ORDER — SODIUM CHLORIDE 0.9 % IV SOLN
2.0000 g | Freq: Once | INTRAVENOUS | Status: AC
  Administered 2023-01-29: 2 g via INTRAVENOUS
  Filled 2023-01-29: qty 20

## 2023-01-29 MED ORDER — LACTULOSE 10 GM/15ML PO SOLN
20.0000 g | Freq: Three times a day (TID) | ORAL | Status: DC
  Administered 2023-01-29 – 2023-01-30 (×3): 20 g via ORAL
  Filled 2023-01-29 (×3): qty 30

## 2023-01-29 MED ORDER — MIRTAZAPINE 15 MG PO TABS
7.5000 mg | ORAL_TABLET | Freq: Every day | ORAL | Status: DC
  Administered 2023-01-29 – 2023-02-02 (×5): 7.5 mg via ORAL
  Filled 2023-01-29 (×5): qty 1

## 2023-01-29 MED ORDER — VANCOMYCIN HCL 1250 MG/250ML IV SOLN
1250.0000 mg | INTRAVENOUS | Status: DC
  Administered 2023-01-29: 1250 mg via INTRAVENOUS
  Filled 2023-01-29: qty 250

## 2023-01-29 MED ORDER — ONDANSETRON HCL 4 MG/2ML IJ SOLN
4.0000 mg | Freq: Once | INTRAMUSCULAR | Status: AC
  Administered 2023-01-29: 4 mg via INTRAVENOUS
  Filled 2023-01-29: qty 2

## 2023-01-29 MED ORDER — SODIUM CHLORIDE 0.9 % IV SOLN: INTRAVENOUS | Status: DC

## 2023-01-29 MED ORDER — SODIUM CHLORIDE 0.9 % IV BOLUS
500.0000 mL | Freq: Once | INTRAVENOUS | Status: AC
  Administered 2023-01-29: 500 mL via INTRAVENOUS

## 2023-01-29 MED ORDER — PANTOPRAZOLE SODIUM 40 MG PO TBEC
40.0000 mg | DELAYED_RELEASE_TABLET | Freq: Two times a day (BID) | ORAL | Status: DC
  Administered 2023-01-29 – 2023-02-03 (×10): 40 mg via ORAL
  Filled 2023-01-29 (×10): qty 1

## 2023-01-29 MED ORDER — CALCITONIN (SALMON) 200 UNIT/ML IJ SOLN
4.0000 [IU]/kg | Freq: Two times a day (BID) | INTRAMUSCULAR | Status: AC
  Administered 2023-01-29 – 2023-01-30 (×3): 244 [IU] via INTRAMUSCULAR
  Filled 2023-01-29 (×2): qty 1.22
  Filled 2023-01-29: qty 2
  Filled 2023-01-29 (×2): qty 1.22

## 2023-01-29 MED ORDER — ALBUMIN HUMAN 25 % IV SOLN
12.5000 g | Freq: Once | INTRAVENOUS | Status: AC
  Administered 2023-01-30: 12.5 g via INTRAVENOUS
  Filled 2023-01-29: qty 50

## 2023-01-29 MED ORDER — MORPHINE SULFATE (PF) 4 MG/ML IV SOLN
4.0000 mg | Freq: Once | INTRAVENOUS | Status: AC
  Administered 2023-01-29: 4 mg via INTRAVENOUS
  Filled 2023-01-29: qty 1

## 2023-01-29 MED ORDER — PROPOFOL 500 MG/50ML IV EMUL
INTRAVENOUS | Status: DC | PRN
  Administered 2023-01-29: 200 ug/kg/min via INTRAVENOUS

## 2023-01-29 MED ORDER — VANCOMYCIN VARIABLE DOSE PER UNSTABLE RENAL FUNCTION (PHARMACIST DOSING): Status: DC

## 2023-01-29 MED ORDER — HYDROXYZINE HCL 10 MG PO TABS
10.0000 mg | ORAL_TABLET | Freq: Three times a day (TID) | ORAL | Status: DC | PRN
  Administered 2023-01-29 – 2023-02-02 (×9): 10 mg via ORAL
  Filled 2023-01-29 (×9): qty 1

## 2023-01-29 MED ORDER — POTASSIUM CHLORIDE 10 MEQ/100ML IV SOLN
10.0000 meq | INTRAVENOUS | Status: AC
  Administered 2023-01-29 (×4): 10 meq via INTRAVENOUS
  Filled 2023-01-29 (×4): qty 100

## 2023-01-29 MED ORDER — URSODIOL 300 MG PO CAPS
300.0000 mg | ORAL_CAPSULE | Freq: Three times a day (TID) | ORAL | Status: DC
  Administered 2023-01-29 – 2023-02-03 (×14): 300 mg via ORAL
  Filled 2023-01-29 (×22): qty 1

## 2023-01-29 NOTE — Progress Notes (Signed)
Pharmacy Antibiotic Note  Mallory Mendoza is a 42 y.o. female admitted on 01/28/2023 with [place] growing in ascitic fluid.  Pharmacy has been consulted for vancomycin dosing.  SCr 1.22 but this is about double what it was 57mo ago.  Plan: Vancomycin 1250mg  IV x1; monitor SCr +/- vanc level prior to redosing.  Height: 5\' 4"  (162.6 cm) Weight: 60.8 kg (134 lb) IBW/kg (Calculated) : 54.7  Temp (24hrs), Avg:98.2 F (36.8 C), Min:97.8 F (36.6 C), Max:98.4 F (36.9 C)  Recent Labs  Lab 01/28/23 1310 01/28/23 2212 01/28/23 2214  WBC 7.7 9.9  --   CREATININE 1.22* 1.25*  --   LATICACIDVEN  --   --  1.4    Estimated Creatinine Clearance: 51.1 mL/min (A) (by C-G formula based on SCr of 1.25 mg/dL (H)).    No Known Allergies  Thank you for allowing pharmacy to be a part of this patient's care.  Wynona Neat, PharmD, BCPS  01/29/2023 1:08 AM

## 2023-01-29 NOTE — ED Provider Notes (Addendum)
[facility] Provider Note   CSN: [id number] Arrival date & time: 01/28/23  2040     History  Chief Complaint  Patient presents with   Abdominal Pain    Mallory Mendoza is a 42 y.o. female.  Patient presents to the emergency department at the request of her GI doctor.  Patient has a history of cirrhosis, had outpatient paracentesis performed yesterday.  Dr. Abbey Chatters called her at home today and told her that there was bacteria in her fluid, she needed to come to the ED be evaluated.  She has not had any fevers.  She does report that she is experiencing abdominal pain more than usual.       Home Medications Prior to Admission medications   Medication Sig Start Date End Date Taking? Authorizing Provider  folic acid (FOLVITE) 1 MG tablet Take 1 mg by mouth daily. 01/10/23   [provider]  furosemide (LASIX) 40 MG tablet Take 1 tablet (40 mg total) by mouth every morning. 12/07/22   Barton Dubois, MD  gabapentin (NEURONTIN) 100 MG capsule Take 100 mg by mouth 2 (two) times daily. 12/02/22   [provider]  hydrOXYzine (ATARAX) 10 MG tablet Take 10 mg by mouth 3 (three) times daily as needed for itching.    [provider]  Ibuprofen-diphenhydrAMINE HCl (ADVIL PM) 200-25 MG CAPS Take 2 tablets by mouth at bedtime.    [provider]  lactulose (CHRONULAC) 10 GM/15ML solution Take 30 mLs (20 g total) by mouth 3 (three) times daily. 11/08/22   Johnson, Clanford L, MD  metoprolol tartrate (LOPRESSOR) 25 MG tablet Take 1 tablet (25 mg total) by mouth 2 (two) times daily. 12/07/22   Barton Dubois, MD  mirtazapine (REMERON) 7.5 MG tablet Take 7.5 mg by mouth at bedtime.    [provider]  ondansetron (ZOFRAN) 4 MG tablet Take 1 tablet (4 mg total) by mouth every 8 (eight) hours as needed for nausea or vomiting. 11/08/22   Johnson, Clanford L, MD  pantoprazole (PROTONIX) 40 MG tablet Take 1 tablet (40 mg  total) by mouth 2 (two) times daily. 11/08/22 11/08/23  Johnson, Clanford L, MD  rifaximin (XIFAXAN) 550 MG TABS tablet Take 1 tablet (550 mg total) by mouth 2 (two) times daily. 11/08/22   Johnson, Clanford L, MD  spironolactone (ALDACTONE) 100 MG tablet Take 1 tablet (100 mg total) by mouth daily. 12/24/22 02/22/23  Manuella Ghazi, Pratik D, DO  ursodiol (ACTIGALL) 300 MG capsule Take 1 capsule (300 mg total) by mouth 3 (three) times daily. 12/23/22 02/21/23  Heath Lark D, DO      Allergies    Patient has no known allergies.    Review of Systems   Review of Systems  Physical Exam Updated Vital Signs BP (!) 106/93   Pulse 77   Temp 98.4 F (36.9 C) (Oral)   Resp 16   Ht 5\' 4"  (1.626 m)   Wt 60.8 kg   SpO2 100%   BMI 23.00 kg/m  Physical Exam Vitals and nursing note reviewed.  Constitutional:      General: She is not in acute distress.    Appearance: She is well-developed.  HENT:     Head: Normocephalic and atraumatic.     Mouth/Throat:     Mouth: Mucous membranes are moist.  Eyes:     General: Vision grossly intact. Gaze aligned appropriately.     Extraocular Movements: Extraocular movements intact.  Conjunctiva/sclera: Conjunctivae normal.  Cardiovascular:     Rate and Rhythm: Normal rate and regular rhythm.     Pulses: Normal pulses.     Heart sounds: Normal heart sounds, S1 normal and S2 normal. No murmur heard.    No friction rub. No gallop.  Pulmonary:     Effort: Pulmonary effort is normal. No respiratory distress.     Breath sounds: Normal breath sounds.  Abdominal:     General: Bowel sounds are normal.     Palpations: Abdomen is soft.     Tenderness: There is generalized abdominal tenderness. There is no guarding or rebound.     Hernia: No hernia is present.  Musculoskeletal:        General: No swelling.     Cervical back: Full passive range of motion without pain, normal range of motion and neck supple. No spinous process tenderness or muscular tenderness. Normal  range of motion.     Right lower leg: No edema.     Left lower leg: No edema.  Skin:    General: Skin is warm and dry.     Capillary Refill: Capillary refill takes less than 2 seconds.     Findings: No ecchymosis, erythema, rash or wound.  Neurological:     General: No focal deficit present.     Mental Status: She is alert and oriented to person, place, and time.     GCS: GCS eye subscore is 4. GCS verbal subscore is 5. GCS motor subscore is 6.     Cranial Nerves: Cranial nerves 2-12 are intact.     Sensory: Sensation is intact.     Motor: Motor function is intact.     Coordination: Coordination is intact.  Psychiatric:        Attention and Perception: Attention normal.        Mood and Affect: Mood normal.        Speech: Speech normal.        Behavior: Behavior normal.     ED Results / Procedures / Treatments   Labs (all labs ordered are listed, but only abnormal results are displayed) Labs Reviewed  CBC WITH DIFFERENTIAL/PLATELET - Abnormal; Notable for the following components:      Result Value   RBC 2.90 (*)    Hemoglobin 9.3 (*)    HCT 28.3 (*)    RDW 15.9 (*)    All other components within normal limits  COMPREHENSIVE METABOLIC PANEL - Abnormal; Notable for the following components:   Sodium 134 (*)    Potassium 3.0 (*)    Glucose, Bld 100 (*)    Creatinine, Ser 1.25 (*)    Calcium 13.9 (*)    Albumin 2.9 (*)    Total Bilirubin 2.2 (*)    GFR, Estimated 56 (*)    All other components within normal limits  CULTURE, BLOOD (ROUTINE X 2)  CULTURE, BLOOD (ROUTINE X 2)  LACTIC ACID, PLASMA    EKG None  Radiology US Paracentesis  Result Date: 01/27/2023 INDICATION: Alcoholic cirrhosis; recurrent ascites EXAM: ULTRASOUND GUIDED RLQ PARACENTESIS MEDICATIONS: 10 cc 1% lidocaine COMPLICATIONS: None immediate. PROCEDURE: Informed written consent was obtained from the patient after a discussion of the risks, benefits and alternatives to treatment. A timeout was  performed prior to the initiation of the procedure. Initial ultrasound scanning demonstrates a large amount of ascites within the right lower abdominal quadrant. The right lower abdomen was prepped and draped in the usual sterile fashion. 1% lidocaine was used  for local anesthesia. Following this, a 19 gauge, 7-cm, Yueh catheter was introduced. An ultrasound image was saved for documentation purposes. The paracentesis was performed. The catheter was removed and a dressing was applied. The patient tolerated the procedure well without immediate post procedural complication. Patient received post-procedure intravenous albumin; see nursing notes for details. FINDINGS: A total of approximately 3.2 liters of yellow fluid was removed. Samples were sent to the laboratory as requested by the clinical team. IMPRESSION: Successful ultrasound-guided paracentesis yielding 3.2 liters of peritoneal fluid. PLAN: The patient has required >/=2 paracenteses in a 30 day period and a formal evaluation by the [facility] has been arranged. Read by Lavonia Drafts [facility] Electronically Signed   By: Ruthann Cancer M.D.   On: 01/27/2023 13:49    Procedures Procedures    Medications Ordered in ED Medications  cefTRIAXone (ROCEPHIN) 2 g in sodium chloride 0.9 % 100 mL IVPB (2 g Intravenous New Bag/Given 01/29/23 0039)  morphine (PF) 4 MG/ML injection 4 mg (has no administration in time range)  ondansetron (ZOFRAN) injection 4 mg (has no administration in time range)    ED Course/ Medical Decision Making/ A&P                             Medical Decision Making Amount and/or Complexity of Data Reviewed External Data Reviewed: labs, radiology and notes. Labs: ordered. Decision-making details documented in ED Course. Radiology: ordered and independent interpretation performed. Decision-making details documented in ED Course.  Risk Prescription drug management.   Presents to  the emergency department with increasing abdominal pain.  Patient has cirrhosis, had paracentesis performed yesterday.  Differential diagnosis considered includes, but not limited to: SBP, colitis, small bowel obstruction  Patient presents with abdominal pain.  She reports that her GI doctor called her and told her to come to the ED.  Records were reviewed and she did have a paracentesis yesterday.  Culture is growing gram-positive cocci, no ID available yet.  This could be contaminant, but must consider infection with her continued abdominal pain despite therapeutic paracentesis yesterday.  Given Rocephin and vancomycin for broad-spectrum coverage.  Will admit the patient, pending cultures.  Abdominal exam does reveal diffuse tenderness but no clear peritonitis.  Additionally, labs show hypercalcemia.  This could also lead to abdominal pain and may need further workup during hospital stay.          Final Clinical Impression(s) / ED Diagnoses Final diagnoses:  SBP (spontaneous bacterial peritonitis) ([place])  Hypercalcemia    Rx / DC Orders ED Discharge Orders     None         Takoda Janowiak, Gwenyth Allegra, MD 01/29/23 0100    Orpah Greek, MD 01/29/23 [phone]

## 2023-01-29 NOTE — Progress Notes (Signed)
[facility] PROGRESS NOTE  Mallory Mendoza (DOB: 03/11/1981) [id number] PCP: Carrolyn Meiers, MD Outpatient Specialists: [facility]  Brief Narrative: Mallory Mendoza is a 42 y.o. female with a history of alcohol abuse, cirrhosis, GERD, and anxiety who was advised to present to the [facility] on 01/28/2023 due to [facility] noted on culture of peritoneal fluid which was drawn (3.2L) on 3/13. She also had abdominal pain. Found to be afebrile with WBC 7.7k. Hypercalcemia (13.3, albumin 2.7) with AKI (SCr 1.22 from prior 0.6) and hypokalemia (2.9) and hyponatremia (132) also noted. IV fluids and antibiotics were given and the patient admitted this morning  Subjective: Abdominal pain mild, pt wants to eat but is NPO prior to EGD.   Objective: BP 112/65   Pulse 87   Temp 99 F (37.2 C) (Oral)   Resp 20   Ht 5\' 4"  (1.626 m)   Wt 57.2 kg   SpO2 100%   BMI 21.65 kg/m   Gen: No distress resting quietly in ED room this AM. Pulm: Clear, nonlabored  CV: RRR, no MRG or JVD GI: Soft, diffusely mildly tender, +BS, not taut Neuro: Alert and oriented. No asterixis. No new focal deficits. Ext: Warm, no deformities. Skin: No jaundice. No rashes, lesions or ulcers on visualized skin   Assessment & Plan: Alcohol-related hepatic cirrhosis with recurrent ascites, history of hepatic encephalopathy and HRS (admission Dec 2023): MELD-Na is 18 (6% 62-month mortality) on admission. 1.6L paracentesis 2/19, 3.2L 3/13.  - Appreciate GI and nephrology recommendations. Giving fluids, stopped spironolactone and can hold lasix. Will give albumin x48 hours, confirmed ok w/pt.  - 2g sodium restriction, GI wishes to restart lasix. - Continue lactulose, rifaximin. Mentating clearly currently.   Concern for SBP: +gram stain and culture, though PMN count is only 11.  - Continue empiric abx x5 days per GI.  - Albumin as above - Pain control as ordered  Normocytic anemia:  - Monitor serially, no gross blood loss  noted.  - EGD performed 3/15 incomplete NPO limited evaluation but was unremarkable. Will plan for outpatient EGD/colo per GI.   GERD:  - PPI  Hypercalcemia: Presenting Ca 13.3, corrects to 14.3 with hypoalbuminemia.  - Given calcitonin, IVF, stop spironolactone. Appreciate nephrology consultation. Ionized calcium pending. Vitamin D is low at 18, PTH and PTHrP pending. Continue calcitonin x4 doses.   Hypokalemia: supplemented, will monitor.  Patrecia Pour, MD [facility] [email or web address] 01/29/2023, 4:07 PM

## 2023-01-29 NOTE — Telephone Encounter (Signed)
Noted  

## 2023-01-29 NOTE — ED Notes (Signed)
Date and time results received: 01/29/23 0148 (use smartphrase ".now" to insert current time)  Test: calcium Critical Value: 13.4  Name of Provider Notified: Frederich Cha, MD  Orders Received? Or Actions Taken?:  n/a
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
## 2023-01-29 NOTE — Assessment & Plan Note (Signed)
-   Continue lactulose - Continue rifaximin - Patient was supposed to see GI today, sent epic chat to GI notifying that she was admitted (GI office was the one that sent her to the ER) - Holding ursodiol as it has been known to close and increasing serum calcium levels - Continue folic acid - Holding spironolactone as it has been noted to interfere with calcium as well - Continue to monitor

## 2023-01-29 NOTE — Progress Notes (Signed)
PACU on hold.  Patient is post EGD.  Has met PACU criteria for d/c, however patient is to be admitted to the floor. Patient came from the ED and had the EGD done and is now in PACU awaiting bed request.  Telemetry bed request is in computer-ready to plan.  Patient resting comfortably.

## 2023-01-29 NOTE — Assessment & Plan Note (Signed)
-   Potassium 3.0 - 40 mEq potassium at admission - Monitor on telemetry - Recheck in a.m.

## 2023-01-29 NOTE — Anesthesia Preprocedure Evaluation (Signed)
Anesthesia Evaluation  Patient identified by MRN, date of birth, ID band Patient awake    Reviewed: Allergy & Precautions, H&P , NPO status , Patient's Chart, lab work & pertinent test results, reviewed documented beta blocker date and time   Airway Mallampati: II  TM Distance: >3 FB Neck ROM: full    Dental no notable dental hx.    Pulmonary neg pulmonary ROS, pneumonia, Current Smoker and Patient abstained from smoking.   Pulmonary exam normal breath sounds clear to auscultation       Cardiovascular Exercise Tolerance: Good negative cardio ROS  Rhythm:regular Rate:Normal     Neuro/Psych  PSYCHIATRIC DISORDERS Anxiety Depression    negative neurological ROS  negative psych ROS   GI/Hepatic negative GI ROS, Neg liver ROS,GERD  ,,  Endo/Other  negative endocrine ROS    Renal/GU Renal diseasenegative Renal ROS  negative genitourinary   Musculoskeletal   Abdominal   Peds  Hematology negative hematology ROS (+) Blood dyscrasia, anemia   Anesthesia Other Findings Hypercalcemia on admission - improving   Reproductive/Obstetrics negative OB ROS                             Anesthesia Physical Anesthesia Plan  ASA: 3  Anesthesia Plan: General   Post-op Pain Management:    Induction:   PONV Risk Score and Plan: Propofol infusion  Airway Management Planned:   Additional Equipment:   Intra-op Plan:   Post-operative Plan:   Informed Consent: I have reviewed the patients History and Physical, chart, labs and discussed the procedure including the risks, benefits and alternatives for the proposed anesthesia with the patient or authorized representative who has indicated his/her understanding and acceptance.     Dental Advisory Given  Plan Discussed with: CRNA  Anesthesia Plan Comments:        Anesthesia Quick Evaluation

## 2023-01-29 NOTE — Assessment & Plan Note (Signed)
-   New onset of unknown etiology - There is a possibility of cirrhosis induced hypercalcemia - Checking PTH, ionized calcium, phosphorus, vitamin D  - Calcitonin, hydration, Lasix to treat hypercalcemia - Follow-up a.m. labs - EKG shows a QTc of 439 - Monitor on telemetry - Continue to monitor

## 2023-01-29 NOTE — Transfer of Care (Signed)
Immediate Anesthesia Transfer of Care Note  Patient: Mallory Mendoza  Procedure(s) Performed: ESOPHAGOGASTRODUODENOSCOPY (EGD) WITH PROPOFOL  Patient Location: PACU  Anesthesia Type:General  Level of Consciousness: awake, alert , and oriented  Airway & Oxygen Therapy: Patient Spontanous Breathing  Post-op Assessment: Report given to RN, Post -op Vital signs reviewed and stable, Patient moving all extremities X 4, and Patient able to stick tongue midline  Post vital signs: Reviewed  Last Vitals:  Vitals Value Taken Time  BP 93/54 01/29/23 1055  Temp 97.5   Pulse 88 01/29/23 1057  Resp 14 01/29/23 1057  SpO2 100 % 01/29/23 1057  Vitals shown include unvalidated device data.  Last Pain:  Vitals:   01/29/23 1047  TempSrc:   PainSc: 7       Patients Stated Pain Goal: 5 ([phone] [phone])  Complications: No notable events documented.

## 2023-01-29 NOTE — TOC Progression Note (Signed)
Transition of Care [facility]) - Progression Note    Patient Details  Name: Mallory Mendoza MRN: [id number] Date of Birth: 1981-09-20  Transition of Care [facility]) CM/SW Contact  Salome Arnt, [place] Phone Number: 01/29/2023, 10:55 AM  Clinical Narrative:   Transition of Care [facility]) Screening Note   Patient Details  Name: Mallory Mendoza Date of Birth: [birth year]   Transition of Care [facility]) CM/SW Contact:    Salome Arnt, [place] Phone Number: 01/29/2023, 10:55 AM    Transition of Care Department [facility]) has reviewed patient and no TOC needs have been identified at this time. We will continue to monitor patient advancement through interdisciplinary progression rounds. If new patient transition needs arise, please place a TOC consult.            Expected Discharge Plan and Services                                               Social Determinants of Health (SDOH) Interventions SDOH Screenings   Food Insecurity: No Food Insecurity (12/21/2022)  Housing: Low Risk  (12/21/2022)  Recent Concern: Housing - Medium Risk (10/31/2022)  Transportation Needs: No Transportation Needs (12/21/2022)  Recent Concern: Transportation Needs - Unmet Transportation Needs (10/31/2022)  Utilities: Not At Risk (12/21/2022)  Alcohol Screen: Medium Risk (03/11/2021)  Depression (PHQ2-9): Medium Risk (03/11/2021)  Financial Resource Strain: Medium Risk (03/11/2021)  Physical Activity: Insufficiently Active (03/11/2021)  Social Connections: Moderately Integrated (03/11/2021)  Stress: Stress Concern Present (03/11/2021)  Tobacco Use: High Risk (01/29/2023)    Readmission Risk Interventions    12/21/2022   10:29 AM  Readmission Risk Prevention Plan  Transportation Screening Complete  HRI or Home Care Consult Complete  Social Work Consult for [place] Planning/Counseling Complete  Palliative Care Screening Not Applicable  Medication Review [employer]) Complete

## 2023-01-29 NOTE — Telephone Encounter (Signed)
Patient will need a hospital follow up in about 3 weeks.   Will need to be set up for EGD and colon due to anemia. EGD today without varices but exam incomplete due to food in the stomach.   Mallory Mendoza - this is just an FYI to you since you just seen her.   Venetia Night, MSN, APRN, FNP-BC, AGACNP-BC [facility]astroenterology at [facility]

## 2023-01-29 NOTE — Assessment & Plan Note (Signed)
Continue PPI ?

## 2023-01-29 NOTE — Progress Notes (Signed)
Will give albumin 25% (every 8 hours) today given possible SBP. Will give recommended 1g/kg of body weight. She will received 25g twice today and one dose of 12.5g tomorrow morning morning.   Venetia Night, MSN, APRN, FNP-BC, AGACNP-BC [facility] Gastroenterology at [facility]

## 2023-01-29 NOTE — Assessment & Plan Note (Signed)
-   Likely due to hypercalcemia - There was also concern for SBP given recent paracentesis on March 13 with gram-positive cocci in culture - Started on vancomycin - Blood cultures pending - Continue vancomycin - Continue to monitor

## 2023-01-29 NOTE — Telephone Encounter (Signed)
Noted. Pt was admitted to hospital

## 2023-01-29 NOTE — Consult Note (Addendum)
Nephrology Consult   Assessment/Recommendations: Mallory Mendoza is a/an 42 y.o. female with a past medical history notable for AKI    AKI  -baseline Cr ~0.6. Up to 1.2 on 3/14. Down to 1.08. Suspecting her AKI is mostly related to hypercalcemia. HRS is a possibility (has had a recent history of this in Dec '23 with a Cr of 8.87), would recommend holding diuretics/spironolactone at this time (BP too soft). Will check urine studies in the interim.  -c/w albumin x 48 hrs, can add on NS if no significant improvement with calcium -will check renal ultrasound and urine studies in the meantime -if kidney function is moving in the opposite direction and her BP continues to be soft, then would recommend starting midodrine and octreotide -Avoid nephrotoxic medications including NSAIDs and iodinated intravenous contrast exposure unless the latter is absolutely indicated.  Preferred narcotic agents for pain control are hydromorphone, fentanyl, and methadone. Morphine should not be used. Avoid Baclofen and avoid oral sodium phosphate and magnesium citrate based laxatives / bowel preps. Continue strict Input and Output monitoring. Will monitor the patient closely with you and intervene or adjust therapy as indicated by changes in clinical status/labs   Hypercalcemia -etiology? Possibly related to high dose of spironolactone. Fluid plan as above -PTH pending, will check PTHrP -Calcium improving, c/w isotonic fluids, continue to monitor closely  Decompensated alcoholic cirrohsis -GI following  SBP -on rocephin and vanc, per primary service and GI  Anemia -transfuse PRN -s/p EGD today  Hypokalemia, hypomagnesemia -mild, replete PRN  Recommendations conveyed to primary service.   Will not be physically seen over the weekend unless indicated. We will be monitoring chart/labs over the weekend. Please call on-call/covering nephrologist with any questions/concerns.  [facility] 01/29/2023 11:11 AM   _____________________________________________________________________________________   History of Present Illness: Mallory Mendoza is a/an 42 y.o. female with a past medical history of alcoholic cirrhosis, GERD, anxiety presents to the ER with abdominal pain.  She had a paracentesis done on 3/13 and there is concern for spontaneous bacterial peritonitis, culture was positive for gram-positive cocci.  She was also found to have a calcium of 13.3 with a creatinine of 1.2 (last known creatinine on 12/23/2022 was 0.66).  She was started on fluids and her calcium is now down to 12.8 with a creatinine of 1.08.  She underwent EGD today. Patient seen and examined in PACU. She is s/p EGD. She reports that she has been taking Advil PM lately and has needed to increase it to 4 tablets. Its been about 2 weeks since she did this as per patient. Her biggest complaint is her inability to sleep. Does have some abd discomfort which is unchanged and mild as per patient. She otherwise denies any chest pain, SOB, swelling, diminished urinary frequency, OTC calcium and/or vitamin D use.  Medications:  Current Facility-Administered Medications  Medication Dose Route Frequency Provider Last Rate Last Admin   0.9 %  sodium chloride infusion   Intravenous Continuous Zierle-Ghosh, Asia B, DO   Stopped at 01/29/23 0757   [MAR Hold] acetaminophen (TYLENOL) tablet 650 mg  650 mg Oral Q6H PRN Zierle-Ghosh, Asia B, DO       Or   [MAR Hold] acetaminophen (TYLENOL) suppository 650 mg  650 mg Rectal Q6H PRN Zierle-Ghosh, Asia B, DO       [START ON 01/30/2023] albumin human 25 % solution 12.5 g  12.5 g Intravenous Once Sherron Monday, NP  albumin human 25 % solution 25 g  25 g Intravenous Q8H Mahon, Lenise Arena, NP       [MAR Hold] calcitonin (MIACALCIN) injection 244 Units  4 Units/kg Intramuscular BID Zierle-Ghosh, Asia B, DO   244 Units at 01/29/23 123456   [facility] Hold] folic acid (FOLVITE)  tablet 1 mg  1 mg Oral Daily Zierle-Ghosh, Asia B, DO       furosemide (LASIX) tablet 40 mg  40 mg Oral Daily Mahon, Courtney L, NP       [MAR Hold] gabapentin (NEURONTIN) capsule 100 mg  100 mg Oral BID Zierle-Ghosh, Asia B, DO       [MAR Hold] heparin injection 5,000 Units  5,000 Units Subcutaneous Q8H Zierle-Ghosh, Asia B, DO   5,000 Units at 01/29/23 0541   [MAR Hold] hydrOXYzine (ATARAX) tablet 10 mg  10 mg Oral TID PRN Zierle-Ghosh, [place] B, DO       lactated ringers infusion   Intravenous Continuous Louann Sjogren, MD   Stopped at 01/29/23 1053   [MAR Hold] lactulose (CHRONULAC) 10 GM/15ML solution 20 g  20 g Oral TID Zierle-Ghosh, Asia B, DO       [MAR Hold] metoprolol tartrate (LOPRESSOR) tablet 25 mg  25 mg Oral BID Zierle-Ghosh, Asia B, DO       [MAR Hold] mirtazapine (REMERON) tablet 7.5 mg  7.5 mg Oral QHS Zierle-Ghosh, Asia B, DO       [MAR Hold] morphine (PF) 2 MG/ML injection 2 mg  2 mg Intravenous Q2H PRN Zierle-Ghosh, Asia B, DO       [MAR Hold] ondansetron (ZOFRAN) tablet 4 mg  4 mg Oral Q6H PRN Zierle-Ghosh, Asia B, DO       Or   [MAR Hold] ondansetron (ZOFRAN) injection 4 mg  4 mg Intravenous Q6H PRN Zierle-Ghosh, Asia B, DO       [MAR Hold] oxyCODONE (Oxy IR/ROXICODONE) immediate release tablet 5 mg  5 mg Oral Q4H PRN Zierle-Ghosh, Asia B, DO       [MAR Hold] pantoprazole (PROTONIX) EC tablet 40 mg  40 mg Oral BID Zierle-Ghosh, Asia B, DO       [MAR Hold] rifaximin (XIFAXAN) tablet 550 mg  550 mg Oral BID Zierle-Ghosh, Asia B, DO       [MAR Hold] ursodiol (ACTIGALL) capsule 300 mg  300 mg Oral TID Zierle-Ghosh, Asia B, DO       [MAR Hold] vancomycin (VANCOREADY) IVPB 1250 mg/250 mL  1,250 mg Intravenous Q24H Lyndee Leo, [facility]         ALLERGIES Patient has no known allergies.  MEDICAL HISTORY Past Medical History:  Diagnosis Date   Abscess    Anxiety    Cirrhosis ([place])    PBC/ETOH   Depression      SOCIAL HISTORY Social History   Socioeconomic History    Marital status: Single    Spouse name: Not on file   Number of children: 4   Years of education: Not on file   Highest education level: Not on file  Occupational History   Not on file  Tobacco Use   Smoking status: Every Day    Packs/day: 0.50    Years: 12.00    Additional pack years: 0.00    Total pack years: 6.00    Types: Cigarettes   Smokeless tobacco: Never  Vaping Use   Vaping Use: Never used  Substance and Sexual Activity   Alcohol use: Not Currently    Alcohol/week: 14.0 standard  drinks of alcohol    Types: 14 Cans of beer per week    Comment: Quit November 2023   Drug use: Not Currently    Types: Marijuana    Comment: "sometimes"   Sexual activity: Not Currently    Birth control/protection: Surgical    Comment: tubal  Other Topics Concern   Not on file  Social History Narrative   ** Merged History Encounter **       Social Determinants of Health   Financial Resource Strain: Medium Risk (03/11/2021)   Overall Financial Resource Strain (CARDIA)    Difficulty of Paying Living Expenses: Somewhat hard  Food Insecurity: No Food Insecurity (12/21/2022)   Hunger Vital Sign    Worried About Running Out of Food in the Last Year: Never true    Ran Out of Food in the Last Year: Never true  Transportation Needs: No Transportation Needs (12/21/2022)   PRAPARE - Hydrologist (Medical): No    Lack of Transportation (Non-Medical): No  Recent Concern: Transportation Needs - Unmet Transportation Needs (10/31/2022)   PRAPARE - Transportation    Lack of Transportation (Medical): Yes    Lack of Transportation (Non-Medical): Yes  Physical Activity: Insufficiently Active (03/11/2021)   Exercise Vital Sign    Days of Exercise per Week: 1 day    Minutes of Exercise per Session: 10 min  Stress: Stress Concern Present (03/11/2021)   [place]    Feeling of Stress : Very much  Social  Connections: Moderately Integrated (03/11/2021)   Social Connection and Isolation Panel [NHANES]    Frequency of Communication with Friends and Family: More than three times a week    Frequency of Social Gatherings with Friends and Family: More than three times a week    Attends Religious Services: 1 to 4 times per year    Active Member of [employer] or Organizations: No    Attends Archivist Meetings: 1 to 4 times per year    Marital Status: Never married  Intimate Partner Violence: Not At Risk (12/21/2022)   Humiliation, Afraid, Rape, and Kick questionnaire    Fear of Current or Ex-Partner: No    Emotionally Abused: No    Physically Abused: No    Sexually Abused: No     FAMILY HISTORY Family History  Problem Relation Age of Onset   Hypertension Sister    Hypertension Mother      Review of Systems: 12 systems reviewed Otherwise as per HPI, all other systems reviewed and negative  Physical Exam: Vitals:   01/29/23 1101 01/29/23 1105  BP: (!) 84/55 (!) 82/66  Pulse: 88 86  Resp: 16 16  Temp:    SpO2: 100% 100%   Total I/O In: 50 [I.V.:50] Out: 0   Intake/Output Summary (Last 24 hours) at 01/29/2023 1111 Last data filed at 01/29/2023 1053 Gross per 24 hour  Intake 50 ml  Output 0 ml  Net 50 ml   General: well-appearing, no acute distress HEENT: icteric sclera, oropharynx clear without lesions, dry MM CV: regular rate, normal rhythm, no murmurs, no gallops, no rubs Lungs: clear to auscultation bilaterally, normal work of breathing Abd: slightly distended, mildly TTP, soft Skin: no visible lesions or rashes Psych: alert, engaged, appropriate mood and affect Musculoskeletal: no obvious deformities Neuro: normal speech, no gross focal deficits   Test Results Reviewed Lab Results  Component Value Date   NA 136 01/29/2023   K  3.4 (L) 01/29/2023   CL 105 01/29/2023   CO2 23 01/29/2023   BUN 14 01/29/2023   CREATININE 1.08 (H) 01/29/2023   CALCIUM 12.8 (H)  01/29/2023   ALBUMIN 2.6 (L) 01/29/2023   PHOS 3.6 01/29/2023     I have reviewed all relevant outside healthcare records related to the patient's kidney injury.
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
## 2023-01-30 LAB — CBC
HCT: 20.3 % — ABNORMAL LOW (ref 36.0–46.0)
Hemoglobin: 6.7 g/dL — CL (ref 12.0–15.0)
MCH: 32.1 pg (ref 26.0–34.0)
MCHC: 33 g/dL (ref 30.0–36.0)
MCV: 97.1 fL (ref 80.0–100.0)
Platelets: 158 10*3/uL (ref 150–400)
RBC: 2.09 MIL/uL — ABNORMAL LOW (ref 3.87–5.11)
RDW: 15.9 % — ABNORMAL HIGH (ref 11.5–15.5)
WBC: 6 10*3/uL (ref 4.0–10.5)
nRBC: 0 % (ref 0.0–0.2)

## 2023-01-30 LAB — COMPREHENSIVE METABOLIC PANEL
ALT: 8 U/L (ref 0–44)
AST: 18 U/L (ref 15–41)
Albumin: 2.9 g/dL — ABNORMAL LOW (ref 3.5–5.0)
Alkaline Phosphatase: 41 U/L (ref 38–126)
Anion gap: 7 (ref 5–15)
BUN: 10 mg/dL (ref 6–20)
CO2: 22 mmol/L (ref 22–32)
Calcium: 11.3 mg/dL — ABNORMAL HIGH (ref 8.9–10.3)
Chloride: 108 mmol/L (ref 98–111)
Creatinine, Ser: 0.77 mg/dL (ref 0.44–1.00)
GFR, Estimated: >60 mL/min (ref >60)
Glucose, Bld: 68 mg/dL — ABNORMAL LOW (ref 70–99)
Potassium: 3.1 mmol/L — ABNORMAL LOW (ref 3.5–5.1)
Sodium: 137 mmol/L (ref 135–145)
Total Bilirubin: 2 mg/dL — ABNORMAL HIGH (ref 0.3–1.2)
Total Protein: 6.1 g/dL — ABNORMAL LOW (ref 6.5–8.1)

## 2023-01-30 LAB — HEMOGLOBIN AND HEMATOCRIT, BLOOD
HCT: 27.7 % — ABNORMAL LOW (ref 36.0–46.0)
Hemoglobin: 9.3 g/dL — ABNORMAL LOW (ref 12.0–15.0)

## 2023-01-30 LAB — PREPARE RBC (CROSSMATCH)

## 2023-01-30 MED ORDER — TRAZODONE HCL 50 MG PO TABS
50.0000 mg | ORAL_TABLET | Freq: Every evening | ORAL | Status: DC | PRN
  Administered 2023-01-30 – 2023-02-01 (×2): 50 mg via ORAL
  Filled 2023-01-30 (×2): qty 1

## 2023-01-30 MED ORDER — MELATONIN 3 MG PO TABS
3.0000 mg | ORAL_TABLET | Freq: Every day | ORAL | Status: DC
  Administered 2023-01-30 – 2023-02-02 (×4): 3 mg via ORAL
  Filled 2023-01-30 (×4): qty 1

## 2023-01-30 MED ORDER — POTASSIUM CHLORIDE CRYS ER 20 MEQ PO TBCR
40.0000 meq | EXTENDED_RELEASE_TABLET | Freq: Two times a day (BID) | ORAL | Status: AC
  Administered 2023-01-30 (×2): 40 meq via ORAL
  Filled 2023-01-30 (×2): qty 2

## 2023-01-30 MED ORDER — SODIUM CHLORIDE 0.9% IV SOLUTION: Freq: Once | INTRAVENOUS | Status: AC

## 2023-01-30 MED ORDER — METRONIDAZOLE 500 MG PO TABS
500.0000 mg | ORAL_TABLET | Freq: Two times a day (BID) | ORAL | Status: DC
  Administered 2023-01-30 – 2023-02-03 (×9): 500 mg via ORAL
  Filled 2023-01-30 (×9): qty 1

## 2023-01-30 MED ORDER — VANCOMYCIN HCL 750 MG/150ML IV SOLN: 750.0000 mg | Freq: Two times a day (BID) | INTRAVENOUS | Status: DC

## 2023-01-30 MED ORDER — LACTULOSE 10 GM/15ML PO SOLN
10.0000 g | Freq: Three times a day (TID) | ORAL | Status: DC
  Administered 2023-01-30 – 2023-02-03 (×12): 10 g via ORAL
  Filled 2023-01-30 (×12): qty 30

## 2023-01-30 MED ORDER — SODIUM CHLORIDE 0.9 % IV SOLN
2.0000 g | INTRAVENOUS | Status: AC
  Administered 2023-01-30 – 2023-02-02 (×4): 2 g via INTRAVENOUS
  Filled 2023-01-30 (×4): qty 20

## 2023-01-30 NOTE — Anesthesia Postprocedure Evaluation (Signed)
Anesthesia Post Note  Patient: Mallory Mendoza  Procedure(s) Performed: ESOPHAGOGASTRODUODENOSCOPY (EGD) WITH PROPOFOL  Patient location during evaluation: Phase II Anesthesia Type: General Level of consciousness: awake Pain management: pain level controlled Vital Signs Assessment: post-procedure vital signs reviewed and stable Respiratory status: spontaneous breathing and respiratory function stable Cardiovascular status: blood pressure returned to baseline and stable Postop Assessment: no headache and no apparent nausea or vomiting Anesthetic complications: no Comments: Late entry   No notable events documented.   Last Vitals:  Vitals:   01/29/23 2101 01/30/23 0530  BP: 107/63 106/65  Pulse: 97 89  Resp: 18 16  Temp: 37.1 C 37.2 C  SpO2: 96% 97%    Last Pain:  Vitals:   01/30/23 0720  TempSrc:   PainSc: [place]

## 2023-01-30 NOTE — Progress Notes (Signed)
Blood complete with no adverse reactions. Vitals stable

## 2023-01-30 NOTE — Progress Notes (Signed)
Pharmacy Antibiotic Note  Mallory Mendoza is a 42 y.o. female admitted on 01/28/2023 with [place] growing in ascitic fluid.  Pharmacy has been consulted for vancomycin dosing. SCr 1.22 on admit which was nearly double what it was 15mo ago, renal function has since improved with a scr of now 0.7. Will continue to use traditional dosing, but will increase dose given improvement in renal function.   Patient currently afebrile, wbc normal at 6.   Blood cultures from 3/14 are ngtd.  Ascitic fluid cxs from 3/13 showed staph epi which were pan S  Plan: Will stop vancomycin and continue ceftriaxone 2g daily for a total of 5 days per GI recs (end date of 3/19)   Height: 5\' 4"  (162.6 cm) Weight: 57.2 kg (126 lb 1.7 oz) IBW/kg (Calculated) : 54.7  Temp (24hrs), Avg:98.5 F (36.9 C), Min:97.5 F (36.4 C), Max:99 F (37.2 C)  Recent Labs  Lab 01/28/23 1310 01/28/23 2212 01/28/23 2214 01/29/23 0121 01/29/23 0559 01/29/23 1715 01/30/23 0446  WBC 7.7 9.9  --   --  8.7  --  6.0  CREATININE 1.22* 1.25*  --  1.25* 1.08* 0.93 0.77  LATICACIDVEN  --   --  1.4  --   --   --   --      Estimated Creatinine Clearance: 79.9 mL/min (by C-G formula based on SCr of 0.77 mg/dL).    No Known Allergies  Thank you for allowing pharmacy to be a part of this patient's care.  Erin Hearing PharmD., BCPS Clinical Pharmacist 01/30/2023 10:10 AM

## 2023-01-30 NOTE — Progress Notes (Signed)
[facility] PROGRESS NOTE  Mallory Mendoza (DOB: Feb 14, 1981) [id number] PCP: Carrolyn Meiers, MD Outpatient Specialists: [facility]  Brief Narrative: Mallory Mendoza is a 42 y.o. female with a history of alcohol abuse, cirrhosis, GERD, and anxiety who was advised to present to the [place] on 01/28/2023 due to [facility] noted on culture of peritoneal fluid which was drawn (3.2L) on 3/13. She also had abdominal pain. Found to be afebrile with WBC 7.7k. Hypercalcemia (13.3, albumin 2.7) with AKI (SCr 1.22 from prior 0.6) and hypokalemia (2.9) and hyponatremia (132) also noted. IV fluids and antibiotics were given and the patient admitted this morning  Subjective: Abdominal pain mild, pt wants to eat but is NPO prior to EGD.   Objective: BP 103/71   Pulse 85   Temp 97.9 F (36.6 C) (Oral)   Resp 17   Ht 5\' 4"  (1.626 m)   Wt 57.2 kg   SpO2 100%   BMI 21.65 kg/m   Gen: No distress resting quietly in ED room this AM. Pulm: Clear, nonlabored  CV: RRR, no MRG or JVD GI: Soft, diffusely mildly tender, +BS, not taut Neuro: Alert and oriented. No asterixis. No new focal deficits. Ext: Warm, no deformities. Skin: No jaundice. No rashes, lesions or ulcers on visualized skin   Assessment & Plan: Alcohol-related hepatic cirrhosis with recurrent ascites, history of hepatic encephalopathy and HRS (admission Dec 2023): MELD-Na is 18 (6% 45-month mortality) on admission. 1.6L paracentesis 2/19, 3.2L 3/13.  - Appreciate GI and nephrology recommendations. Fluids initially given for hypercalcemia, though stopped per GI and lasix restarted. Holding spironolactone.  - Will continue albumin x48 hours. - 2g sodium restriction  - Continue lactulose, rifaximin. Mentating clearly currently, having > 6 BM/day, will decrease lactulose dose 20g > 10g TID  Concern for SBP: +gram stain and culture with S. epi, pansensitive, though PMN count is only 11.  - Continue empiric abx with ceftriaxone x5 days  per GI. DC vancomycin.  - Monitor blood cultures from 3/14 (NGTD).  - Albumin as above - Pain control as ordered  Normocytic anemia: No bleeding, bili elevated but stable.  - EGD performed 3/15 incomplete NPO limited evaluation but was unremarkable. Will plan for outpatient EGD/colo per GI.  - IV fluids administered at admission may have unveiled worsening anemia. Hgb down to 6.7g/dl. Pt consents, giving 1u PRBCs 3/16 and will monitor.  AKI: Urine sodium 54. Renal U/S without hydronephrosis. Dirty catch urine sample showed trace protein by dipstick, no RBCs and no casts. No dysuria reported.  - Cr improving with measures as discussed above, will continue monitoring.    Insomnia:  - Trial trazodone and melatonin  Hypercalcemia: Presenting Ca 13.3, corrects to 14.3 with hypoalbuminemia. Has sustained improvement to 11.3 mg/dl (corrects to 12.2 w/albumin 2.9) today.  - Holding spironolactone. Appreciate nephrology consultation, d/w Dr. Royce Macadamia this AM. Ionized calcium pending. Vitamin D is low at 18, PTH and PTHrP pending.  - Continue calcitonin x4 doses.   Soft blood pressures: Pt was prescribed metoprolol, though pressures have been soft and no other compelling indication at this time, so will stop for now.  GERD:  - PPI  Hypokalemia:  - Repeat supplementation and monitoring.  Trichomoniasis:  - Flagyl 500mg  po BID x 7 days.  Tobacco use:  - Nicotine patch, cessation counseling  Patrecia Pour, MD [facility] [email or web address] 01/30/2023, 10:53 AM

## 2023-01-31 DIAGNOSIS — E875 Hyperkalemia: Secondary | ICD-10-CM | POA: Diagnosis not present

## 2023-01-31 DIAGNOSIS — K7031 Alcoholic cirrhosis of liver with ascites: Secondary | ICD-10-CM | POA: Diagnosis not present

## 2023-01-31 DIAGNOSIS — K7682 Hepatic encephalopathy: Secondary | ICD-10-CM | POA: Diagnosis not present

## 2023-01-31 DIAGNOSIS — D509 Iron deficiency anemia, unspecified: Secondary | ICD-10-CM | POA: Diagnosis not present

## 2023-01-31 DIAGNOSIS — K766 Portal hypertension: Secondary | ICD-10-CM | POA: Diagnosis not present

## 2023-01-31 DIAGNOSIS — K652 Spontaneous bacterial peritonitis: Secondary | ICD-10-CM | POA: Diagnosis not present

## 2023-01-31 LAB — TYPE AND SCREEN
ABO/RH(D): AB POS
Antibody Screen: NEGATIVE
Unit division: 0

## 2023-01-31 LAB — CBC
HCT: 26.1 % — ABNORMAL LOW (ref 36.0–46.0)
Hemoglobin: 8.6 g/dL — ABNORMAL LOW (ref 12.0–15.0)
MCH: 31.5 pg (ref 26.0–34.0)
MCHC: 33 g/dL (ref 30.0–36.0)
MCV: 95.6 fL (ref 80.0–100.0)
Platelets: 160 10*3/uL (ref 150–400)
RBC: 2.73 MIL/uL — ABNORMAL LOW (ref 3.87–5.11)
RDW: 16.5 % — ABNORMAL HIGH (ref 11.5–15.5)
WBC: 6.5 10*3/uL (ref 4.0–10.5)
nRBC: 0 % (ref 0.0–0.2)

## 2023-01-31 LAB — CALCIUM, IONIZED: Calcium, Ionized, Serum: 7.8 mg/dL — ABNORMAL HIGH (ref 4.5–5.6)

## 2023-01-31 LAB — COMPREHENSIVE METABOLIC PANEL
ALT: 8 U/L (ref 0–44)
AST: 22 U/L (ref 15–41)
Albumin: 2.8 g/dL — ABNORMAL LOW (ref 3.5–5.0)
Alkaline Phosphatase: 40 U/L (ref 38–126)
Anion gap: 7 (ref 5–15)
BUN: 6 mg/dL (ref 6–20)
CO2: 21 mmol/L — ABNORMAL LOW (ref 22–32)
Calcium: 11.5 mg/dL — ABNORMAL HIGH (ref 8.9–10.3)
Chloride: 110 mmol/L (ref 98–111)
Creatinine, Ser: 0.69 mg/dL (ref 0.44–1.00)
GFR, Estimated: >60 mL/min (ref >60)
Glucose, Bld: 113 mg/dL — ABNORMAL HIGH (ref 70–99)
Potassium: 3.4 mmol/L — ABNORMAL LOW (ref 3.5–5.1)
Sodium: 138 mmol/L (ref 135–145)
Total Bilirubin: 2.1 mg/dL — ABNORMAL HIGH (ref 0.3–1.2)
Total Protein: 6.3 g/dL — ABNORMAL LOW (ref 6.5–8.1)

## 2023-01-31 LAB — BPAM RBC
Blood Product Expiration Date: 202404162359
ISSUE DATE / TIME: 202403160949
Unit Type and Rh: 6200

## 2023-01-31 LAB — TSH: TSH: 2.859 u[IU]/mL (ref 0.350–4.500)

## 2023-01-31 LAB — PARATHYROID HORMONE, INTACT (NO CA): PTH: 6 pg/mL — ABNORMAL LOW (ref 15–65)

## 2023-01-31 MED ORDER — SODIUM CHLORIDE 0.9 % IV BOLUS
250.0000 mL | Freq: Once | INTRAVENOUS | Status: AC
  Administered 2023-01-31: 250 mL via INTRAVENOUS

## 2023-01-31 MED ORDER — ALBUMIN HUMAN 25 % IV SOLN
25.0000 g | Freq: Once | INTRAVENOUS | Status: AC
  Administered 2023-01-31: 25 g via INTRAVENOUS
  Filled 2023-01-31: qty 100

## 2023-01-31 MED ORDER — POTASSIUM CHLORIDE CRYS ER 20 MEQ PO TBCR
40.0000 meq | EXTENDED_RELEASE_TABLET | Freq: Once | ORAL | Status: AC
  Administered 2023-01-31: 40 meq via ORAL
  Filled 2023-01-31: qty 2

## 2023-01-31 MED ORDER — SODIUM CHLORIDE 0.9 % IV BOLUS
500.0000 mL | Freq: Once | INTRAVENOUS | Status: AC
  Administered 2023-01-31: 500 mL via INTRAVENOUS

## 2023-01-31 NOTE — Plan of Care (Signed)

## 2023-01-31 NOTE — Plan of Care (Signed)
Brief nephrology note.  Chart reviewed.  Patient not seen   Hypercalcemia - Sending SPEP, UPEP, and free light chains  - PTH r P is in process  - intact PTH appropriately suppressed  - NS 500 mL once   Claudia Desanctis, MD 10:24 AM 01/31/2023

## 2023-01-31 NOTE — Progress Notes (Signed)
[facility] PROGRESS NOTE  Mallory Mendoza (DOB: [birth year]) [id number] PCP: Carrolyn Meiers, MD Outpatient Specialists: [facility]  Brief Narrative: LEON LUSKY is a 42 y.o. female with a history of alcohol abuse, cirrhosis, GERD, and anxiety who was advised to present to the [facility] on 01/28/2023 due to [facility] noted on culture of peritoneal fluid which was drawn (3.2L) on 3/13. She also had abdominal pain. Found to be afebrile with WBC 7.7k. Hypercalcemia (13.3, albumin 2.7) with AKI (SCr 1.22 from prior 0.6) and hypokalemia (2.9) and hyponatremia (132) also noted. IV fluids and antibiotics were given and the patient admitted.   Subjective: Feels better today, slept better, last night got rushed into taking pills and threw them up. This morning better.   Objective: BP 98/60 (BP Location: Right Arm)   Pulse 100   Temp 98.8 F (37.1 C) (Oral)   Resp 17   Ht 5\' 4"  (1.626 m)   Wt 57.2 kg   SpO2 100%   BMI 21.65 kg/m   Gen: No distress Pulm: Clear, nonlabored  CV: RRR, no MRG, no significant edema GI: Soft, not tender, +BS Neuro: Alert and oriented. No new focal deficits. Ext: Warm, no deformities Skin: No rashes, lesions or ulcers on visualized skin   Assessment & Plan: Alcohol-related hepatic cirrhosis with recurrent ascites, history of hepatic encephalopathy and HRS (admission Dec 2023): MELD-Na is 18 (6% 82-month mortality) on admission. 1.6L paracentesis 2/19, 3.2L 3/13.  - Appreciate GI and nephrology recommendations. Fluids initially given for hypercalcemia, though stopped per GI and lasix restarted. Holding spironolactone.  - Received albumin  - 2g sodium restriction  - Continue lactulose, rifaximin. Mentating clearly currently, decreased lactulose dose 20g > 10g TID  Concern for SBP: +gram stain and culture with S. epi, pansensitive, though PMN count is only 11. Pathology report negative. - Continue empiric abx with ceftriaxone x5 days per GI. DC  vancomycin.  - Monitor blood cultures from 3/14 (NGTD).  - Albumin as above - Pain control as ordered  Hypercalcemia, PTH-independent: Presenting Ca 13.3, corrects to 14.3 with hypoalbuminemia. Has improved but not normalized despite discontinuation of spironolactone. PTH appropriately suppressed. Vitamin D is low at 18. Alk Phos wnl. TSH 2.859.  - Holding spironolactone. Appreciate nephrology consultation, d/w Dr. Royce Macadamia this AM, give 500cc IVF, send SPEP, UPEP, FLC.  - PTHrP still pending.  - Completed calcitonin x4 doses.   Normocytic anemia: No bleeding, bili elevated but stable.  - EGD performed 3/15 incomplete NPO limited evaluation but was unremarkable. Will plan for outpatient EGD/colo per GI.  - Hgb up appropriately s/p 1u PRBCs 3/16.  AKI: Urine sodium 54. Renal U/S without hydronephrosis. Dirty catch urine sample showed trace protein by dipstick, no RBCs and no casts. No dysuria reported.  - Cr improving back to baseline, normal Crcl, with measures as discussed above, will continue monitoring.    Insomnia:  - Trial trazodone and melatonin, improved.  Soft blood pressures: Holding metoprolol.  GERD:  - PPI  Hypokalemia:  - Repeat supplementation and monitoring.  Trichomoniasis:  - Flagyl 500mg  po BID x 7 days.  Tobacco use:  - Nicotine patch, cessation counseling  Patrecia Pour, MD [facility] [email or web address] 01/31/2023, 3:10 PM

## 2023-01-31 NOTE — Progress Notes (Signed)
Subjective: Patient doing well.  States she did not sleep well last night.  Vomited her pills which she states nurse was giving her too quickly.  Requesting Zofran.  Objective: Vital signs in last 24 hours: Temp:  [98.6 F (37 C)-99.1 F (37.3 C)] 99.1 F (37.3 C) (03/17 0406) Pulse Rate:  [82-97] 93 (03/17 0406) Resp:  [16-18] 18 (03/17 0406) BP: (117-124)/(71-73) 124/73 (03/17 0406) SpO2:  [100 %] 100 % (03/17 0406) Last BM Date : 01/30/23 General:   Alert and oriented, pleasant Head:  Normocephalic and atraumatic. Eyes:  No icterus, sclera clear. Conjuctiva pink.  Abdomen:  Bowel sounds present, soft, non-tender, non-distended. No HSM or hernias noted. No rebound or guarding. No masses appreciated  Msk:  Symmetrical without gross deformities. Normal posture. Extremities:  Without clubbing or edema. Neurologic:  Alert and  oriented x4;  grossly normal neurologically. Skin:  Warm and dry, intact without significant lesions.  Cervical Nodes:  No significant cervical adenopathy. Psych:  Alert and cooperative. Normal mood and affect.  Intake/Output from previous day: 03/16 0701 - 03/17 0700 In: 1096.9 [P.O.:720; Blood:278; IV Piggyback:98.9] Out: 200 [Stool:200] Intake/Output this shift: No intake/output data recorded.  Lab Results: Recent Labs    01/29/23 0559 01/30/23 0446 01/30/23 1357 01/31/23 0508  WBC 8.7 6.0  --  6.5  HGB 8.8* 6.7* 9.3* 8.6*  HCT 26.6* 20.3* 27.7* 26.1*  PLT 192 158  --  160   BMET Recent Labs    01/29/23 1715 01/30/23 0446 01/31/23 0508  NA 135 137 138  K 3.2* 3.1* 3.4*  CL 104 108 110  CO2 24 22 21*  GLUCOSE 87 68* 113*  BUN 13 10 6   CREATININE 0.93 0.77 0.69  CALCIUM 11.9* 11.3* 11.5*   LFT Recent Labs    01/29/23 1715 01/30/23 0446 01/31/23 0508  PROT 6.5 6.1* 6.3*  ALBUMIN 2.7* 2.9* 2.8*  AST 24 18 22   ALT 10 8 8   ALKPHOS 50 41 40  BILITOT 1.9* 2.0* 2.1*   PT/INR Recent Labs    01/28/23 1310  LABPROT 18.3*  INR  1.5*   Hepatitis Panel No results for input(s): "HEPBSAG", "HCVAB", "HEPAIGM", "HEPBIGM" in the last 72 hours.   Studies/Results: US RENAL  Result Date: 01/29/2023 CLINICAL DATA:  Acute renal failure EXAM: RENAL / URINARY TRACT ULTRASOUND COMPLETE COMPARISON:  Ultrasound abdomen 11/02/2022 FINDINGS: Right Kidney: Renal measurements: 11.6 x 6.2 x 6.2 cm = volume: 233.6 mL. Echogenicity within normal limits. No mass or hydronephrosis visualized. Left Kidney: Renal measurements: 12.1 x 6.1 x 5.5 cm = volume: 212.8 mL. Echogenicity within normal limits. No mass or hydronephrosis visualized. Bladder: Decompressed Other: Ascites. IMPRESSION: 1. No hydronephrosis. 2. Ascites. Electronically Signed   By: Lovey Newcomer M.D.   On: 01/29/2023 12:41    Assessment: *Decompensated alcohol cirrhosis *Portal hypertension with ascites *Hepatic encephalopathy *Hypercalcemia *Hypokalemia   Plan: Patient's ascitic fluid culture growing staph epi. Given lack of indwelling hardware, PMN less than 250, no fever or leukocytosis, I think this is likely contaminant.  Will not need SBP prophylaxis going forward.  Continue Xifaxan and lactulose for hepatic encephalopathy.  Goal of 3-4 loose bowel movements daily.  Daily LFTs and coags.  Outpatient EGD and colonoscopy to further evaluate anemia.  EGD this admission without varices though incomplete exam due to food in stomach.  Hemoglobin improved status post 1 unit PRBCs.  Continue to monitor.  Continue PPI twice daily.  Etiology of her hypercalcemia unclear.  Nephro has been consulted.  Further studies pending.  IV zofran prn nausea.  Patient needs to take pills slowly 1 at a time.  Elon Alas. Abbey Chatters, D.O. Gastroenterology and Hepatology [facility]   LOS: 2 days    01/31/2023, 11:15 AM

## 2023-02-01 ENCOUNTER — Telehealth: Payer: Self-pay | Admitting: Gastroenterology

## 2023-02-01 ENCOUNTER — Inpatient Hospital Stay (HOSPITAL_COMMUNITY): Payer: Medicaid Other

## 2023-02-01 DIAGNOSIS — R109 Unspecified abdominal pain: Secondary | ICD-10-CM | POA: Diagnosis not present

## 2023-02-01 DIAGNOSIS — N179 Acute kidney failure, unspecified: Secondary | ICD-10-CM | POA: Diagnosis not present

## 2023-02-01 DIAGNOSIS — J90 Pleural effusion, not elsewhere classified: Secondary | ICD-10-CM | POA: Diagnosis not present

## 2023-02-01 DIAGNOSIS — K7682 Hepatic encephalopathy: Secondary | ICD-10-CM | POA: Diagnosis not present

## 2023-02-01 DIAGNOSIS — K652 Spontaneous bacterial peritonitis: Secondary | ICD-10-CM | POA: Diagnosis not present

## 2023-02-01 DIAGNOSIS — E876 Hypokalemia: Secondary | ICD-10-CM | POA: Diagnosis not present

## 2023-02-01 LAB — COMPREHENSIVE METABOLIC PANEL
ALT: 6 U/L (ref 0–44)
AST: 20 U/L (ref 15–41)
Albumin: 2.9 g/dL — ABNORMAL LOW (ref 3.5–5.0)
Alkaline Phosphatase: 37 U/L — ABNORMAL LOW (ref 38–126)
Anion gap: 8 (ref 5–15)
BUN: <5 mg/dL — ABNORMAL LOW (ref 6–20)
CO2: 21 mmol/L — ABNORMAL LOW (ref 22–32)
Calcium: 11.7 mg/dL — ABNORMAL HIGH (ref 8.9–10.3)
Chloride: 108 mmol/L (ref 98–111)
Creatinine, Ser: 0.71 mg/dL (ref 0.44–1.00)
GFR, Estimated: >60 mL/min (ref >60)
Glucose, Bld: 86 mg/dL (ref 70–99)
Potassium: 3.3 mmol/L — ABNORMAL LOW (ref 3.5–5.1)
Sodium: 137 mmol/L (ref 135–145)
Total Bilirubin: 2 mg/dL — ABNORMAL HIGH (ref 0.3–1.2)
Total Protein: 6.3 g/dL — ABNORMAL LOW (ref 6.5–8.1)

## 2023-02-01 LAB — CBC
HCT: 26.1 % — ABNORMAL LOW (ref 36.0–46.0)
Hemoglobin: 8.6 g/dL — ABNORMAL LOW (ref 12.0–15.0)
MCH: 32.2 pg (ref 26.0–34.0)
MCHC: 33 g/dL (ref 30.0–36.0)
MCV: 97.8 fL (ref 80.0–100.0)
Platelets: 155 10*3/uL (ref 150–400)
RBC: 2.67 MIL/uL — ABNORMAL LOW (ref 3.87–5.11)
RDW: 16.3 % — ABNORMAL HIGH (ref 11.5–15.5)
WBC: 6.9 10*3/uL (ref 4.0–10.5)
nRBC: 0 % (ref 0.0–0.2)

## 2023-02-01 LAB — KAPPA/LAMBDA LIGHT CHAINS
Kappa free light chain: 82.7 mg/L — ABNORMAL HIGH (ref 3.3–19.4)
Kappa, lambda light chain ratio: 1.16 (ref 0.26–1.65)
Lambda free light chains: 71.1 mg/L — ABNORMAL HIGH (ref 5.7–26.3)

## 2023-02-01 MED ORDER — POTASSIUM CHLORIDE CRYS ER 20 MEQ PO TBCR
40.0000 meq | EXTENDED_RELEASE_TABLET | Freq: Once | ORAL | Status: AC
  Administered 2023-02-01: 40 meq via ORAL
  Filled 2023-02-01: qty 2

## 2023-02-01 MED ORDER — ZOLEDRONIC ACID 4 MG/100ML IV SOLN
4.0000 mg | Freq: Once | INTRAVENOUS | Status: AC
  Administered 2023-02-01: 4 mg via INTRAVENOUS
  Filled 2023-02-01: qty 100

## 2023-02-01 MED ORDER — ZOLEDRONIC ACID 5 MG/100ML IV SOLN: 5.0000 mg | Freq: Once | INTRAVENOUS | Status: DC

## 2023-02-01 MED ORDER — LACTATED RINGERS IV SOLN: INTRAVENOUS | Status: DC

## 2023-02-01 NOTE — Progress Notes (Signed)
[facility] PROGRESS NOTE  Mallory Mendoza (DOB: 09/20/1981) [id number] PCP: Carrolyn Meiers, MD Outpatient Specialists: [facility]  Brief Narrative: Mallory Mendoza is a 42 y.o. female with a history of alcohol abuse, cirrhosis, GERD, and anxiety who was advised to present to the [place] on 01/28/2023 due to [facility] noted on culture of peritoneal fluid which was drawn (3.2L) on 3/13. She also had abdominal pain. Found to be afebrile with WBC 7.7k. Hypercalcemia (13.3, albumin 2.7) with AKI (SCr 1.22 from prior 0.6) and hypokalemia (2.9) and hyponatremia (132) also noted. IV fluids and antibiotics were given and the patient admitted.   Subjective: She denies abdominal pain, no worsening abdominal distension, she denies bone pain.   Objective: BP 119/80   Pulse 99   Temp 98.5 F (36.9 C) (Oral)   Resp 18   Ht 5\' 4"  (1.626 m)   Wt 57.2 kg   SpO2 98%   BMI 21.65 kg/m   Gen: NAD Pulm: Normal respiratory effort, CTA CV: S 1, S 2 RRRR GI: BS present, soft, nt.  Neuro: alert, non focal.  Ext: no edema   Assessment & Plan: Alcohol-related hepatic cirrhosis with recurrent ascites, history of hepatic encephalopathy and HRS (admission Dec 2023): MELD-Na is 18 (6% 61-month mortality) on admission. 1.6L paracentesis 2/19, 3.2L 3/13.  - Appreciate GI and nephrology recommendations. Fluids initially given for hypercalcemia, though stopped per GI and lasix restarted. Holding spironolactone.  - Received albumin  - 2g sodium restriction  - Continue lactulose, rifaximin. Mentating clearly currently, decreased lactulose dose 20g > 10g TID  Concern for SBP: +gram stain and culture with S. epi, pansensitive, though PMN count is only 11. Pathology report negative.GI think staph pn peritoneal fluid is contaminant, no evidence of SBP.  - Continue empiric abx with ceftriaxone x5 days per GI. DC vancomycin.  - Monitor blood cultures from 3/14 (NGTD).  - Albumin as above - Pain control as  ordered  Hypercalcemia, PTH-independent: Presenting Ca 13.3, corrects to 14.3 with hypoalbuminemia. Has improved but not normalized despite discontinuation of spironolactone. PTH appropriately suppressed. Vitamin D is low at 18. Alk Phos wnl. TSH 2.859.  - Holding spironolactone. Appreciate nephrology consultation. SPEP, UPEP, FLC. Pending - PTHrP still pending.  - Completed calcitonin x4 doses.  -IV zoledronic acid ordered 3/18.  -Resume IV fluids as recommended by nephrology.  -Check Bone skeletal survey    Normocytic anemia: No bleeding, bili elevated but stable.  - EGD performed 3/15 incomplete NPO limited evaluation but was unremarkable. Will plan for outpatient EGD/colo per GI.  - Hgb up appropriately s/p 1u PRBCs 3/16.  AKI: Urine sodium 54. Renal U/S without hydronephrosis. Dirty catch urine sample showed trace protein by dipstick, no RBCs and no casts. No dysuria reported.  - Cr improving back to baseline, normal Crcl, with measures as discussed above, will continue monitoring.    Insomnia:  - Trial trazodone and melatonin, improved.  Soft blood pressures: Holding metoprolol.  GERD:  - PPI  Hypokalemia:  - Replete orally.   Trichomoniasis:  - Flagyl 500mg  po BID x 7 days.  Tobacco use:  - Nicotine patch, cessation counseling  Mallory Shiley, MD [facility] [email or web address] 02/01/2023, 4:04 PM
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
## 2023-02-01 NOTE — Progress Notes (Signed)
Gastroenterology Progress Note    Primary Care Physician:  Carrolyn Meiers, MD Primary Gastroenterologist:  Dr. Abbey Chatters   Patient ID: Mallory Mendoza; [id number]; 05-Jul-1981    Subjective   No N/V, abdominal pain, overt GI bleeding, confusion, mental status changes. Wants to go home.    Objective   Vital signs in last 24 hours Temp:  [98.5 F (36.9 C)-99.8 F (37.7 C)] 98.5 F (36.9 C) (03/18 0443) Pulse Rate:  [99-102] 99 (03/18 0443) Resp:  [17-18] 18 (03/18 0443) BP: (98-121)/(60-80) 119/80 (03/18 0443) SpO2:  [98 %-100 %] 98 % (03/18 0443) Last BM Date : 01/30/23  Physical Exam General:   Alert and oriented, pleasant Head:  Normocephalic and atraumatic. Abdomen:  Bowel sounds present, soft, non-tender, non-distended. No HSM or hernias noted. No rebound or guarding. No masses appreciated  Extremities:  Without  edema. Neurologic:  Alert and  oriented x4; negative asterixis  Psych:  Alert and cooperative. Normal mood and affect.  Intake/Output from previous day: 03/17 0701 - 03/18 0700 In: 1080 [P.O.:1080] Out: 300 [Urine:300] Intake/Output this shift: No intake/output data recorded.  Lab Results  Recent Labs    01/30/23 0446 01/30/23 1357 01/31/23 0508 02/01/23 0406  WBC 6.0  --  6.5 6.9  HGB 6.7* 9.3* 8.6* 8.6*  HCT 20.3* 27.7* 26.1* 26.1*  PLT 158  --  160 155   BMET Recent Labs    01/30/23 0446 01/31/23 0508 02/01/23 0406  NA 137 138 137  K 3.1* 3.4* 3.3*  CL 108 110 108  CO2 22 21* 21*  GLUCOSE 68* 113* 86  BUN 10 6 <5*  CREATININE 0.77 0.69 0.71  CALCIUM 11.3* 11.5* 11.7*   LFT Recent Labs    01/30/23 0446 01/31/23 0508 02/01/23 0406  PROT 6.1* 6.3* 6.3*  ALBUMIN 2.9* 2.8* 2.9*  AST 18 22 20   ALT 8 8 6   ALKPHOS 41 40 37*  BILITOT 2.0* 2.1* 2.0*   Lab Results  Component Value Date   INR 1.5 (H) 01/28/2023   INR 1.7 (H) 12/23/2022   INR 1.7 (H) 12/22/2022    Studies/Results US RENAL  Result Date:  01/29/2023 CLINICAL DATA:  Acute renal failure EXAM: RENAL / URINARY TRACT ULTRASOUND COMPLETE COMPARISON:  Ultrasound abdomen 11/02/2022 FINDINGS: Right Kidney: Renal measurements: 11.6 x 6.2 x 6.2 cm = volume: 233.6 mL. Echogenicity within normal limits. No mass or hydronephrosis visualized. Left Kidney: Renal measurements: 12.1 x 6.1 x 5.5 cm = volume: 212.8 mL. Echogenicity within normal limits. No mass or hydronephrosis visualized. Bladder: Decompressed Other: Ascites. IMPRESSION: 1. No hydronephrosis. 2. Ascites. Electronically Signed   By: Lovey Newcomer M.D.   On: 01/29/2023 12:41   US Paracentesis  Result Date: 01/27/2023 INDICATION: Alcoholic cirrhosis; recurrent ascites EXAM: ULTRASOUND GUIDED RLQ PARACENTESIS MEDICATIONS: 10 cc 1% lidocaine COMPLICATIONS: None immediate. PROCEDURE: Informed written consent was obtained from the patient after a discussion of the risks, benefits and alternatives to treatment. A timeout was performed prior to the initiation of the procedure. Initial ultrasound scanning demonstrates a large amount of ascites within the right lower abdominal quadrant. The right lower abdomen was prepped and draped in the usual sterile fashion. 1% lidocaine was used for local anesthesia. Following this, a 19 gauge, 7-cm, Yueh catheter was introduced. An ultrasound image was saved for documentation purposes. The paracentesis was performed. The catheter was removed and a dressing was applied. The patient tolerated the procedure well without immediate post procedural complication. Patient received post-procedure intravenous albumin;  see nursing notes for details. FINDINGS: A total of approximately 3.2 liters of yellow fluid was removed. Samples were sent to the laboratory as requested by the clinical team. IMPRESSION: Successful ultrasound-guided paracentesis yielding 3.2 liters of peritoneal fluid. PLAN: The patient has required >/=2 paracenteses in a 30 day period and a formal evaluation by  the [facility] has been arranged. Read by Lavonia Drafts [facility] Electronically Signed   By: Ruthann Cancer M.D.   On: 01/27/2023 13:49   US Paracentesis  Result Date: 01/04/2023 INDICATION: Ascites, alcoholic cirrhosis EXAM: ULTRASOUND GUIDED DIAGNOSTIC AND THERAPEUTIC PARACENTESIS MEDICATIONS: None. COMPLICATIONS: None immediate. PROCEDURE: Informed written consent was obtained from the patient after a discussion of the risks, benefits and alternatives to treatment. A timeout was performed prior to the initiation of the procedure. Initial ultrasound scanning demonstrates a large amount of ascites within the right lower abdominal quadrant. The right lower abdomen was prepped and draped in the usual sterile fashion. 1% lidocaine was used for local anesthesia. Following this, a 19 gauge, 7-cm, Yueh catheter was introduced. An ultrasound image was saved for documentation purposes. The paracentesis was performed. The catheter was removed and a dressing was applied. The patient tolerated the procedure well without immediate post procedural complication. FINDINGS: A total of approximately 1.6 L of yellow ascitic fluid was removed. Samples were sent to the laboratory as requested by the clinical team. IMPRESSION: Successful ultrasound-guided paracentesis yielding 1.6 liters of peritoneal fluid. Electronically Signed   By: Lavonia Dana M.D.   On: 01/04/2023 13:11    Assessment  42 y.o. female with a history of  anxiety, depression, decompensated alcoholic cirrhosis, PBC, alcoholic hepatitis in December [id number] complicated by hepatic encephalopathy with previous hospitalization for anasarca who was admitted to the hospital s/p para with concern for SBP.   Concern for SBP: gram stain with gram positive cocci, PMNs less than 250, culture with staph epidermidis. Felt to be a contaminant. No need for ongoing SBP prophylaxis.   Cirrhosis: no alcohol since Nov 2023. On Urso  for PBC. No varices on EGD done this admission. However, was limited view due to food residue in stomach and will need repeat due to anemia as outpatient.   Anemia: chronic. Hgb down to 6.7 this admission. Received 1 unit PRBCs this admission. 8.6 today, without overt GI bleeding. Colonoscopy/EGD as outpatient.   Hypercalcemia: Nephrology evaluation.    Plan / Recommendations  Continue Lasix 40 mg daily, lactulose TID, Xifaxan BID Continue Urso PPI BID Appreciate Nephrology following Follow H/H Needs outpatient colonoscopy/EGD.  Will sign off. Arranging outpatient follow-up for cirrhosis care and arrange procedures.     LOS: 3 days    02/01/2023, 11:28 AM  Annitta Needs, PhD, ANP-BC [facility]

## 2023-02-01 NOTE — Progress Notes (Signed)
Patient ID: Mallory Mendoza, female   DOB: 1981-04-25, 42 y.o.   MRN: [id number] S: No complaints and no events overnight. O:BP 119/80   Pulse 99   Temp 98.5 F (36.9 C) (Oral)   Resp 18   Ht 5\' 4"  (1.626 m)   Wt 57.2 kg   SpO2 98%   BMI 21.65 kg/m   Intake/Output Summary (Last 24 hours) at 02/01/2023 0858 Last data filed at 02/01/2023 0610 Gross per 24 hour  Intake 1080 ml  Output 300 ml  Net 780 ml   Intake/Output: I/O last 3 completed shifts: In: 1560 [P.O.:1560] Out: 500 [Urine:300; Stool:200]  Intake/Output this shift:  No intake/output data recorded. Weight change:  Gen: NAD CVS: RRR Resp:CTA Abd: +BS, soft, NT/ND Ext: no edema  Recent Labs  Lab 01/28/23 2212 01/29/23 0121 01/29/23 0559 01/29/23 1715 01/30/23 0446 01/31/23 0508 02/01/23 0406  NA 134* 135 136 135 137 138 137  K 3.0* 3.2* 3.4* 3.2* 3.1* 3.4* 3.3*  CL 102 101 105 104 108 110 108  CO2 26 23 23 24 22  21* 21*  GLUCOSE 100* 102* 84 87 68* 113* 86  BUN 14 15 14 13 10 6  <5*  CREATININE 1.25* 1.25* 1.08* 0.93 0.77 0.69 0.71  ALBUMIN 2.9* 2.8* 2.6* 2.7* 2.9* 2.8* 2.9*  CALCIUM 13.9* 13.5*  13.4* 12.8* 11.9* 11.3* 11.5* 11.7*  PHOS  --  3.6  --   --   --   --   --   AST 25 26 25 24 18 22 20   ALT 9 9 10 10 8 8 6    Liver Function Tests: Recent Labs  Lab 01/30/23 0446 01/31/23 0508 02/01/23 0406  AST 18 22 20   ALT 8 8 6   ALKPHOS 41 40 37*  BILITOT 2.0* 2.1* 2.0*  PROT 6.1* 6.3* 6.3*  ALBUMIN 2.9* 2.8* 2.9*   No results for input(s): "LIPASE", "AMYLASE" in the last 168 hours. No results for input(s): "AMMONIA" in the last 168 hours. CBC: Recent Labs  Lab 01/28/23 1310 01/28/23 2212 01/29/23 0559 01/30/23 0446 01/30/23 1357 01/31/23 0508 02/01/23 0406  WBC 7.7 9.9 8.7 6.0  --  6.5 6.9  NEUTROABS 4.2 5.9 4.8  --   --   --   --   HGB 8.8* 9.3* 8.8* 6.7* 9.3* 8.6* 8.6*  HCT 26.9* 28.3* 26.6* 20.3* 27.7* 26.1* 26.1*  MCV 97.5 97.6 96.4 97.1  --  95.6 97.8  PLT 214 233 192 158  --   160 155   Cardiac Enzymes: No results for input(s): "CKTOTAL", "CKMB", "CKMBINDEX", "TROPONINI" in the last 168 hours. CBG: No results for input(s): "GLUCAP" in the last 168 hours.  Iron Studies: No results for input(s): "IRON", "TIBC", "TRANSFERRIN", "FERRITIN" in the last 72 hours. Studies/Results: No results found.  folic acid  1 mg Oral Daily   furosemide  40 mg Oral Daily   gabapentin  100 mg Oral BID   heparin  5,000 Units Subcutaneous Q8H   lactulose  10 g Oral TID   melatonin  3 mg Oral QHS   metroNIDAZOLE  500 mg Oral Q12H   mirtazapine  7.5 mg Oral QHS   nicotine  21 mg Transdermal Daily   pantoprazole  40 mg Oral BID   rifaximin  550 mg Oral BID   ursodiol  300 mg Oral TID    BMET    Component Value Date/Time   NA 137 02/01/2023 0406   K 3.3 (L) 02/01/2023 0406   CL  108 02/01/2023 0406   CO2 21 (L) 02/01/2023 0406   GLUCOSE 86 02/01/2023 0406   BUN <5 (L) 02/01/2023 0406   CREATININE 0.71 02/01/2023 0406   CREATININE 0.59 08/12/2020 1602   CALCIUM 11.7 (H) 02/01/2023 0406   GFRNONAA >60 02/01/2023 0406   GFRNONAA 115 08/12/2020 1602   GFRAA 134 08/12/2020 1602   CBC    Component Value Date/Time   WBC 6.9 02/01/2023 0406   RBC 2.67 (L) 02/01/2023 0406   HGB 8.6 (L) 02/01/2023 0406   HCT 26.1 (L) 02/01/2023 0406   PLT 155 02/01/2023 0406   MCV 97.8 02/01/2023 0406   MCH 32.2 02/01/2023 0406   MCHC 33.0 02/01/2023 0406   RDW 16.3 (H) 02/01/2023 0406   LYMPHSABS 2.7 01/29/2023 0559   MONOABS 0.9 01/29/2023 0559   EOSABS 0.3 01/29/2023 0559   BASOSABS 0.0 01/29/2023 0559     Assessment/Plan:  AKI- non-oliguric and presumably due to hypercalcemia and large volume paracentesis.  Scr improved after holding diuretics and IVF's.  Renal US without hydronephrosis.   Hypercalcemia - workup underway.  iPTH appropriately low at 6, PTH related peptide, SPEP/UPEP pending.  Concerning for underlying malignancy as not resolved despite stopping spironolactone.   Consider resuming IVF's, held per GI due to issues with volume and cirrhosis.  S/p calcitonin x 4 doses.  May need bisphophonate.  Consider bone survey x-rays to r/o lytic bone lesions. 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  GI following.  S/p IV albumin, lactulose, rifaximin.   Anemia - normocytic.  EGD on 01/29/23 limited evaluation.  F/u EGD and colonoscopy as an outpatient.  GI following.  Transfused 1 unit.  Continue to follow H/H and transfuse as needed.   Donetta Potts, MD [employer] [phone]

## 2023-02-01 NOTE — Progress Notes (Signed)
Mobility Specialist Progress Note:    02/01/23 0900  Mobility  Activity Ambulated independently in room  Level of Assistance Independent  Assistive Device None  Distance Ambulated (ft) 100 ft  Activity Response Tolerated well  Mobility Referral Yes  $Mobility charge 1 Mobility   Pt agreeable to mobility session. Ambulated independently in room. Tolerated well, asx throughout. Returned pt to EOB, all needs met.   Royetta Crochet Mobility Specialist Please contact via Solicitor or  Rehab office at [phone]

## 2023-02-01 NOTE — Op Note (Signed)
[facility] Patient Name: Mallory Mendoza Procedure Date: 01/29/2023 8:47 AM MRN: [id number] Date of Birth: 16-May-1981 Attending MD: Elon Alas. Edgar Frisk, [id number] CSN: [id number] Age: 42 Admit Type: Outpatient Procedure:                Upper GI endoscopy Indications:              Iron deficiency anemia, Cirrhosis rule out                            esophageal varices Providers:                Elon Alas. Abbey Chatters, DO, Crystal Page, Aram Candela Referring MD:             Elon Alas. Abbey Chatters, DO Medicines:                See the Anesthesia note for documentation of the                            administered medications Complications:            No immediate complications. Estimated Blood Loss:     Estimated blood loss: none. Procedure:                Pre-Anesthesia Assessment:                           - The anesthesia plan was to use monitored                            anesthesia care (MAC).                           After obtaining informed consent, the endoscope was                            passed under direct vision. Throughout the                            procedure, the patient's blood pressure, pulse, and                            oxygen saturations were monitored continuously. The                            [id number] scope was introduced through the                            mouth, and advanced to the second part of duodenum.                            The upper GI endoscopy was accomplished without                            difficulty. The patient tolerated the procedure  well. Scope In: 10:50:54 AM Scope Out: 10:53:05 AM Total Procedure Duration: 0 hours 2 minutes 11 seconds  Findings:      There is no endoscopic evidence of varices in the entire esophagus.      A medium amount of food (residue) was found in the gastric body and in       the gastric antrum.      The duodenal bulb, first portion of the duodenum and second  portion of       the duodenum were normal. Impression:               - A medium amount of food (residue) in the stomach.                           - Normal duodenal bulb, first portion of the                            duodenum and second portion of the duodenum.                           - No specimens collected. Moderate Sedation:      Per Anesthesia Care Recommendation:           - Return patient to hospital ward for ongoing care.                           - Low sodium diet.                           - Will need repeat EGD for full evalution for                            anemia given food in stomach today. This can be                            done outpatient. Consider colonoscopy at same time. Procedure Code(s):        --- Professional ---                           [phone], Esophagogastroduodenoscopy, flexible,                            transoral; diagnostic, including collection of                            specimen(s) by brushing or washing, when performed                            (separate procedure) Diagnosis Code(s):        --- Professional ---                           D50.9, Iron deficiency anemia, unspecified                           K74.60, Unspecified cirrhosis of liver CPT copyright 2022 American Medical Association. All rights reserved. The  codes documented in this report are preliminary and upon coder review may  be revised to meet current compliance requirements. Elon Alas. Abbey Chatters, DO [place] Abbey Chatters, DO 01/29/2023 10:57:24 AM This report has been signed electronically. Number of Addenda: 0

## 2023-02-01 NOTE — Plan of Care (Signed)
  Problem: Education: Goal: Knowledge of General Education information will improve Description Including pain rating scale, medication(s)/side effects and non-pharmacologic comfort measures Outcome: Progressing   Problem: Health Behavior/Discharge Planning: Goal: Ability to manage health-related needs will improve Outcome: Progressing   

## 2023-02-02 DIAGNOSIS — R109 Unspecified abdominal pain: Secondary | ICD-10-CM | POA: Diagnosis not present

## 2023-02-02 DIAGNOSIS — K219 Gastro-esophageal reflux disease without esophagitis: Secondary | ICD-10-CM | POA: Diagnosis not present

## 2023-02-02 DIAGNOSIS — E876 Hypokalemia: Secondary | ICD-10-CM | POA: Diagnosis not present

## 2023-02-02 DIAGNOSIS — K652 Spontaneous bacterial peritonitis: Secondary | ICD-10-CM | POA: Diagnosis not present

## 2023-02-02 DIAGNOSIS — K7682 Hepatic encephalopathy: Secondary | ICD-10-CM | POA: Diagnosis not present

## 2023-02-02 DIAGNOSIS — N179 Acute kidney failure, unspecified: Secondary | ICD-10-CM | POA: Diagnosis not present

## 2023-02-02 DIAGNOSIS — K7031 Alcoholic cirrhosis of liver with ascites: Secondary | ICD-10-CM | POA: Diagnosis not present

## 2023-02-02 LAB — CULTURE, BODY FLUID W GRAM STAIN -BOTTLE

## 2023-02-02 LAB — CULTURE, BLOOD (ROUTINE X 2)
Culture: NO GROWTH
Culture: NO GROWTH

## 2023-02-02 LAB — COMPREHENSIVE METABOLIC PANEL
ALT: 7 U/L (ref 0–44)
AST: 21 U/L (ref 15–41)
Albumin: 2.9 g/dL — ABNORMAL LOW (ref 3.5–5.0)
Alkaline Phosphatase: 50 U/L (ref 38–126)
Anion gap: 7 (ref 5–15)
BUN: <5 mg/dL — ABNORMAL LOW (ref 6–20)
CO2: 22 mmol/L (ref 22–32)
Calcium: 10.8 mg/dL — ABNORMAL HIGH (ref 8.9–10.3)
Chloride: 102 mmol/L (ref 98–111)
Creatinine, Ser: 0.69 mg/dL (ref 0.44–1.00)
GFR, Estimated: >60 mL/min (ref >60)
Glucose, Bld: 119 mg/dL — ABNORMAL HIGH (ref 70–99)
Potassium: 3.3 mmol/L — ABNORMAL LOW (ref 3.5–5.1)
Sodium: 131 mmol/L — ABNORMAL LOW (ref 135–145)
Total Bilirubin: 1.6 mg/dL — ABNORMAL HIGH (ref 0.3–1.2)
Total Protein: 6.4 g/dL — ABNORMAL LOW (ref 6.5–8.1)

## 2023-02-02 LAB — PROTEIN ELECTRO, RANDOM URINE
Albumin ELP, Urine: 24.2 %
Alpha-1-Globulin, U: 6.3 %
Alpha-2-Globulin, U: 11.9 %
Beta Globulin, U: 36.7 %
Gamma Globulin, U: 20.9 %
Total Protein, Urine: 23.7 mg/dL

## 2023-02-02 MED ORDER — FUROSEMIDE 10 MG/ML IJ SOLN
40.0000 mg | Freq: Two times a day (BID) | INTRAMUSCULAR | Status: DC
  Administered 2023-02-02 – 2023-02-03 (×3): 40 mg via INTRAVENOUS
  Filled 2023-02-02 (×3): qty 4

## 2023-02-02 NOTE — Progress Notes (Signed)
Patient ID: Mallory Mendoza, female   DOB: Apr 15, 1981, 42 y.o.   MRN: [id number] S: No new complaints.  Started back on IVF's yesterday and sodium level dropped to 131, K 3.3. O:BP 107/72 (BP Location: Right Arm)   Pulse (!) 104   Temp 99.7 F (37.6 C) (Oral)   Resp 19   Ht 5\' 4"  (1.626 m)   Wt 57.2 kg   SpO2 99%   BMI 21.65 kg/m   Intake/Output Summary (Last 24 hours) at 02/02/2023 1008 Last data filed at 02/02/2023 0500 Gross per 24 hour  Intake 1535.77 ml  Output 503 ml  Net 1032.77 ml   Intake/Output: I/O last 3 completed shifts: In: 2015.8 [P.O.:840; I.V.:876.7; IV Piggyback:299.1] Out: 803 [Urine:803]  Intake/Output this shift:  No intake/output data recorded. Weight change:  Gen:NAD CVS: tachy Resp: CTA Abd: distended, +BS, soft, NT Ext: no edema  Recent Labs  Lab 01/29/23 0121 01/29/23 0559 01/29/23 1715 01/30/23 0446 01/31/23 0508 02/01/23 0406 02/02/23 0416  NA 135 136 135 137 138 137 131*  K 3.2* 3.4* 3.2* 3.1* 3.4* 3.3* 3.3*  CL 101 105 104 108 110 108 102  CO2 23 23 24 22  21* 21* 22  GLUCOSE 102* 84 87 68* 113* 86 119*  BUN 15 14 13 10 6  <5* <5*  CREATININE 1.25* 1.08* 0.93 0.77 0.69 0.71 0.69  ALBUMIN 2.8* 2.6* 2.7* 2.9* 2.8* 2.9* 2.9*  CALCIUM 13.5*  13.4* 12.8* 11.9* 11.3* 11.5* 11.7* 10.8*  PHOS 3.6  --   --   --   --   --   --   AST 26 25 24 18 22 20 21   ALT 9 10 10 8 8 6 7    Liver Function Tests: Recent Labs  Lab 01/31/23 0508 02/01/23 0406 02/02/23 0416  AST 22 20 21   ALT 8 6 7   ALKPHOS 40 37* 50  BILITOT 2.1* 2.0* 1.6*  PROT 6.3* 6.3* 6.4*  ALBUMIN 2.8* 2.9* 2.9*   No results for input(s): "LIPASE", "AMYLASE" in the last 168 hours. No results for input(s): "AMMONIA" in the last 168 hours. CBC: Recent Labs  Lab 01/28/23 1310 01/28/23 2212 01/29/23 0559 01/30/23 0446 01/30/23 1357 01/31/23 0508 02/01/23 0406  WBC 7.7 9.9 8.7 6.0  --  6.5 6.9  NEUTROABS 4.2 5.9 4.8  --   --   --   --   HGB 8.8* 9.3* 8.8* 6.7* 9.3* 8.6*  8.6*  HCT 26.9* 28.3* 26.6* 20.3* 27.7* 26.1* 26.1*  MCV 97.5 97.6 96.4 97.1  --  95.6 97.8  PLT 214 233 192 158  --  160 155   Cardiac Enzymes: No results for input(s): "CKTOTAL", "CKMB", "CKMBINDEX", "TROPONINI" in the last 168 hours. CBG: No results for input(s): "GLUCAP" in the last 168 hours.  Iron Studies: No results for input(s): "IRON", "TIBC", "TRANSFERRIN", "FERRITIN" in the last 72 hours. Studies/Results: DG Bone Survey Met  Result Date: 02/01/2023 CLINICAL DATA:  Hypercalcemia EXAM: METASTATIC BONE SURVEY COMPARISON:  None Available. FINDINGS: No lytic or blastic bone lesions. Small to moderate left pleural effusion. Degenerative changes noted within the cervical spine at C5-6. 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 Distal left fibular ORIF has been performed. IMPRESSION: 1. No lytic or blastic bone lesions. 2. Small to moderate left pleural effusion. Electronically Signed   By: Fidela Salisbury M.D.   On: [id number]    folic acid  1 mg Oral Daily  furosemide  40 mg Oral Daily   gabapentin  100 mg Oral BID   heparin  5,000 Units Subcutaneous Q8H   lactulose  10 g Oral TID   melatonin  3 mg Oral QHS   metroNIDAZOLE  500 mg Oral Q12H   mirtazapine  7.5 mg Oral QHS   nicotine  21 mg Transdermal Daily   pantoprazole  40 mg Oral BID   rifaximin  550 mg Oral BID   ursodiol  300 mg Oral TID    BMET    Component Value Date/Time   NA 131 (L) 02/02/2023 0416   K 3.3 (L) 02/02/2023 0416   CL 102 02/02/2023 0416   CO2 22 02/02/2023 0416   GLUCOSE 119 (H) 02/02/2023 0416   BUN <5 (L) 02/02/2023 0416   CREATININE 0.69 02/02/2023 0416   CREATININE 0.59 08/12/2020 1602   CALCIUM 10.8 (H) 02/02/2023 0416   GFRNONAA >60 02/02/2023 0416   GFRNONAA 115 08/12/2020 1602   GFRAA 134 08/12/2020 1602   CBC    Component Value Date/Time   WBC 6.9 02/01/2023 0406   RBC 2.67 (L)  02/01/2023 0406   HGB 8.6 (L) 02/01/2023 0406   HCT 26.1 (L) 02/01/2023 0406   PLT 155 02/01/2023 0406   MCV 97.8 02/01/2023 0406   MCH 32.2 02/01/2023 0406   MCHC 33.0 02/01/2023 0406   RDW 16.3 (H) 02/01/2023 0406   LYMPHSABS 2.7 01/29/2023 0559   MONOABS 0.9 01/29/2023 0559   EOSABS 0.3 01/29/2023 0559   BASOSABS 0.0 01/29/2023 0559    Assessment/Plan:   AKI- non-oliguric and presumably due to hypercalcemia and large volume paracentesis.  Scr improved after holding diuretics and IVF's.  Renal US without hydronephrosis.   Hypercalcemia - workup underway.  iPTH appropriately low at 6, PTH related peptide, SPEP/UPEP pending.  Slowly improving, however concerning for underlying malignancy as not resolved despite stopping spironolactone.  Will stop IVF's given hyponatremia and start IV lasix 40 mg IV and follow calcium and sodium.  S/p calcitonin x 4 doses.  May need bisphophonate.  Bone survey negative for lytic bone lesions. Hyponatremia - due to cirrhosis and ascites.  Stop IV fluids and start IV lasix above. 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  GI following.  S/p IV albumin, lactulose, rifaximin.   Anemia - normocytic.  EGD on 01/29/23 limited evaluation.  F/u EGD and colonoscopy as an outpatient.  GI following.  Transfused 1 unit.  Continue to follow H/H and transfuse as needed.   Donetta Potts, MD [facility]

## 2023-02-02 NOTE — Progress Notes (Signed)
Mobility Specialist Progress Note:    02/02/23 1429  Mobility  Activity Ambulated with assistance in hallway  Level of Assistance Standby assist, set-up cues, supervision of patient - no hands on  Assistive Device None  Distance Ambulated (ft) 400 ft  Activity Response Tolerated well  Mobility Referral Yes  $Mobility charge 1 Mobility   Pt agreeable to mobility session. Tolerated well, asx throughout. Returned pt to EOB, all needs met.   Royetta Crochet Mobility Specialist Please contact via Solicitor or  Rehab office at [phone]

## 2023-02-02 NOTE — Progress Notes (Signed)
[facility] PROGRESS NOTE  Mallory Mendoza (DOB: March 07, 1981) RL:[id number] PCP: Carrolyn Meiers, MD Outpatient Specialists: [facility]  Brief Narrative: Mallory Mendoza is a 42 y.o. female with a history of alcohol abuse, cirrhosis, GERD, and anxiety who was advised to present to the [place] on 01/28/2023 due to [facility] noted on culture of peritoneal fluid which was drawn (3.2L) on 3/13. She also had abdominal pain. Found to be afebrile with WBC 7.7k. Hypercalcemia (13.3, albumin 2.7) with AKI (SCr 1.22 from prior 0.6) and hypokalemia (2.9) and hyponatremia (132) also noted. IV fluids and antibiotics were given and the patient admitted.   3/19: Hemodynamically stable.  Labs with worsening hyponatremia and persistent hypercalcemia with corrected calcium at 11.7.  Bone survey negative for lytic lesions. nephrology discontinued IV fluid and switch to p.o. Lasix with IV  Subjective: Patient was seen and examined today.  No new complaints.  She wants to go home.  Objective: BP (!) 108/57 (BP Location: Right Arm)   Pulse (!) 118   Temp 99.2 F (37.3 C) (Oral)   Resp 19   Ht 5\' 4"  (1.626 m)   Wt 57.2 kg   SpO2 100%   BMI 21.65 kg/m   General.  Malnourished lady, in no acute distress. Pulmonary.  Lungs clear bilaterally, normal respiratory effort. CV.  Regular rate and rhythm, no JVD, rub or murmur. Abdomen.  Soft, nontender, mildly distended, BS positive. CNS.  Alert and oriented .  No focal neurologic deficit. Extremities.  No edema, no cyanosis, pulses intact and symmetrical. Psychiatry.  Judgment and insight appears normal.   Assessment & Plan: Alcohol-related hepatic cirrhosis with recurrent ascites, history of hepatic encephalopathy and HRS (admission Dec 2023): MELD-Na is 18 (6% 110-month mortality) on admission. 1.6L paracentesis 2/19, 3.2L 3/13.  - Appreciate GI and nephrology recommendations. Fluids initially given for hypercalcemia, though stopped per GI and lasix  restarted. Holding spironolactone.  - Received albumin  - 2g sodium restriction  - Continue lactulose, rifaximin. Mentating clearly currently, decreased lactulose dose 20g > 10g TID  Concern for SBP: +gram stain and culture with S. epi, pansensitive, though PMN count is only 11. Pathology report negative.GI think staph pn peritoneal fluid is contaminant, no evidence of SBP.  - Continue empiric abx with ceftriaxone x5 days per GI. DC vancomycin.  - Monitor blood cultures from 3/14 (NGTD).  - Albumin as above - Pain control as ordered  Hypercalcemia, PTH-independent: Presenting Ca 13.3, corrects to 14.3 with hypoalbuminemia. Has improved but not normalized despite discontinuation of spironolactone. PTH appropriately suppressed. Vitamin D is low at 18. Alk Phos wnl. TSH 2.859.  Bone survey was negative for any lytic or blastic lesions - Holding spironolactone. Appreciate nephrology consultation. SPEP, UPEP, FLC. Pending - PTHrP still pending.  - Completed calcitonin x4 doses.  -IV zoledronic acid ordered 3/18.  -Nephrology stopped again IV fluid and switched to p.o. Lasix with IV  Normocytic anemia: No bleeding, bili elevated but stable.  - EGD performed 3/15 incomplete NPO limited evaluation but was unremarkable. Will plan for outpatient EGD/colo per GI.  - Hgb up appropriately s/p 1u PRBCs 3/16.  AKI: Urine sodium 54. Renal U/S without hydronephrosis. Dirty catch urine sample showed trace protein by dipstick, no RBCs and no casts. No dysuria reported.  - Cr improving back to baseline, normal Crcl, with measures as discussed above, will continue monitoring.    Insomnia:  - Trial trazodone and melatonin, improved.  Soft blood pressures: Holding metoprolol.  GERD:  -  PPI  Hypokalemia:  - Replete orally.   Trichomoniasis:  - Flagyl 500mg  po BID x 7 days.  Tobacco use:  - Nicotine patch, cessation counseling  Lorella Nimrod, MD [facility] [email or web address] 02/02/2023, 4:43 PM

## 2023-02-03 DIAGNOSIS — N179 Acute kidney failure, unspecified: Secondary | ICD-10-CM | POA: Diagnosis not present

## 2023-02-03 DIAGNOSIS — K7682 Hepatic encephalopathy: Secondary | ICD-10-CM | POA: Diagnosis not present

## 2023-02-03 DIAGNOSIS — R109 Unspecified abdominal pain: Secondary | ICD-10-CM | POA: Diagnosis not present

## 2023-02-03 DIAGNOSIS — E876 Hypokalemia: Secondary | ICD-10-CM | POA: Diagnosis not present

## 2023-02-03 DIAGNOSIS — K652 Spontaneous bacterial peritonitis: Secondary | ICD-10-CM | POA: Diagnosis not present

## 2023-02-03 LAB — RENAL FUNCTION PANEL
Albumin: 2.7 g/dL — ABNORMAL LOW (ref 3.5–5.0)
Anion gap: 7 (ref 5–15)
BUN: <5 mg/dL — ABNORMAL LOW (ref 6–20)
CO2: 24 mmol/L (ref 22–32)
Calcium: 9.6 mg/dL (ref 8.9–10.3)
Chloride: 100 mmol/L (ref 98–111)
Creatinine, Ser: 0.72 mg/dL (ref 0.44–1.00)
GFR, Estimated: >60 mL/min (ref >60)
Glucose, Bld: 90 mg/dL (ref 70–99)
Phosphorus: 1.6 mg/dL — ABNORMAL LOW (ref 2.5–4.6)
Potassium: 3.1 mmol/L — ABNORMAL LOW (ref 3.5–5.1)
Sodium: 131 mmol/L — ABNORMAL LOW (ref 135–145)

## 2023-02-03 LAB — MAGNESIUM: Magnesium: 1.1 mg/dL — ABNORMAL LOW (ref 1.7–2.4)

## 2023-02-03 LAB — HEMOGLOBIN AND HEMATOCRIT, BLOOD
HCT: 23.1 % — ABNORMAL LOW (ref 36.0–46.0)
Hemoglobin: 7.7 g/dL — ABNORMAL LOW (ref 12.0–15.0)

## 2023-02-03 MED ORDER — FUROSEMIDE 40 MG PO TABS
40.0000 mg | ORAL_TABLET | Freq: Every day | ORAL | Status: DC
  Filled 2023-02-03: qty 1

## 2023-02-03 MED ORDER — MAGNESIUM SULFATE 2 GM/50ML IV SOLN
2.0000 g | Freq: Once | INTRAVENOUS | Status: AC
  Administered 2023-02-03: 2 g via INTRAVENOUS
  Filled 2023-02-03: qty 50

## 2023-02-03 MED ORDER — POTASSIUM CHLORIDE CRYS ER 20 MEQ PO TBCR
40.0000 meq | EXTENDED_RELEASE_TABLET | Freq: Two times a day (BID) | ORAL | Status: DC
  Administered 2023-02-03: 40 meq via ORAL
  Filled 2023-02-03: qty 2

## 2023-02-03 MED ORDER — PANTOPRAZOLE SODIUM 40 MG PO TBEC: 40.0000 mg | DELAYED_RELEASE_TABLET | Freq: Two times a day (BID) | ORAL | 1 refills | Status: DC

## 2023-02-03 MED ORDER — METRONIDAZOLE 500 MG PO TABS: 500.0000 mg | ORAL_TABLET | Freq: Two times a day (BID) | ORAL | 0 refills | Status: AC

## 2023-02-03 NOTE — Discharge Summary (Signed)
Physician Discharge Summary  Mallory Mendoza [id number] DOB: 11-21-1980 DOA: 01/28/2023  PCP: Carrolyn Meiers, MD  Admit date: 01/28/2023  Discharge date: 02/03/2023  Admitted From:Home  Disposition:  Home  Recommendations for Outpatient Follow-up:  Follow up with PCP in 1-2 weeks Follow-up with GI/liver specialist outpatient which will be scheduled for EGD/colonoscopy Follow-up with SPEP/UPEP results outpatient Continue on Flagyl as prescribed for trichomoniasis Continue PPI twice daily as well as lactulose 3 times daily and Xifaxan twice daily Per nephrology, plan will be to continue on home Lasix and hold spironolactone Hold metoprolol due to soft blood pressure readings Continue on medications as below  Home Health: None  Equipment/Devices: None  Discharge Condition:Stable  CODE STATUS: Full  Diet recommendation: Heart Healthy  Brief/Interim Summary:  Mallory Mendoza is a 42 y.o. female with a history of alcohol abuse, cirrhosis, GERD, and anxiety who was advised to present to the [place] on 01/28/2023 due to [facility] noted on culture of peritoneal fluid which was drawn (3.2L) on 3/13. She also had abdominal pain. Found to be afebrile with WBC 7.7k. Hypercalcemia (13.3, albumin 2.7) with AKI (SCr 1.22 from prior 0.6) and hypokalemia (2.9) and hyponatremia (132) also noted. IV fluids and antibiotics were given and the patient admitted.   Further fluid culture results did not reveal any SBP and therefore antibiotics discontinued.  She did remain on oral Flagyl for trichomoniasis.  She was also noted to have hypercalcemia while admitted and was seen by nephrology.  This did not appear to be related to PTH issue and vitamin D levels were low.  Bone survey was negative for any lytic or blastic lesions and recommendations were to continue outpatient Lasix and hold spironolactone.  She did receive multiple doses of calcitonin and IV zoledronic acid with improvement.  Additionally,  she had EGD 3/15 performed during her stay due to worsening normocytic anemia that required 1 unit PRBC transfusion.  EGD was unremarkable and hemoglobin appears stable with no overt bleeding noted.  She will follow-up outpatient for EGD and colonoscopy.  Discharge Diagnoses:  Principal Problem:   Hypercalcemia Active Problems:   Alcoholic cirrhosis of liver with ascites (HCC)   Hypokalemia   GERD (gastroesophageal reflux disease)   Abdominal pain   SBP (spontaneous bacterial peritonitis) (HCC)   Encephalopathy, hepatic (HCC)  Principal discharge diagnosis: AKI secondary to hypercalcemia and large-volume paracentesis with ascites in the setting of cirrhosis and concern for SBP.  Acute on chronic anemia requiring 1 unit PRBC transfusion.  Discharge Instructions  Discharge Instructions     Diet - low sodium heart healthy   Complete by: As directed    Increase activity slowly   Complete by: As directed       Allergies as of 02/03/2023   No Known Allergies      Medication List     STOP taking these medications    metoprolol tartrate 25 MG tablet Commonly known as: LOPRESSOR   spironolactone 100 MG tablet Commonly known as: ALDACTONE       TAKE these medications    Advil PM 200-25 MG Caps Generic drug: Ibuprofen-diphenhydrAMINE HCl Take 2 tablets by mouth at bedtime.   folic acid 1 MG tablet Commonly known as: FOLVITE Take 1 mg by mouth daily.   furosemide 40 MG tablet Commonly known as: LASIX Take 1 tablet (40 mg total) by mouth every morning.   gabapentin 100 MG capsule Commonly known as: NEURONTIN Take 100 mg by mouth 2 (two) times daily.  hydrOXYzine 10 MG tablet Commonly known as: ATARAX Take 10 mg by mouth 3 (three) times daily as needed for itching.   lactulose 10 GM/15ML solution Commonly known as: CHRONULAC Take 30 mLs (20 g total) by mouth 3 (three) times daily.   metroNIDAZOLE 500 MG tablet Commonly known as: FLAGYL Take 1 tablet (500 mg  total) by mouth every 12 (twelve) hours for 3 days.   mirtazapine 7.5 MG tablet Commonly known as: REMERON Take 7.5 mg by mouth at bedtime.   ondansetron 4 MG tablet Commonly known as: Zofran Take 1 tablet (4 mg total) by mouth every 8 (eight) hours as needed for nausea or vomiting.   pantoprazole 40 MG tablet Commonly known as: Protonix Take 1 tablet (40 mg total) by mouth 2 (two) times daily.   Potassium Chloride ER 20 MEQ Tbcr Take 1 tablet by mouth 2 (two) times daily.   rifaximin 550 MG Tabs tablet Commonly known as: XIFAXAN Take 1 tablet (550 mg total) by mouth 2 (two) times daily.   ursodiol 300 MG capsule Commonly known as: ACTIGALL Take 1 capsule (300 mg total) by mouth 3 (three) times daily.        Follow-up Information     Fanta, Normajean Baxter, MD. Schedule an appointment as soon as possible for a visit in 1 week(s).   Specialty: Internal Medicine Contact information: [place] [address] [phone]                No Known Allergies  Consultations: Nephrology GI   Procedures/Studies: DG Bone Survey Met  Result Date: 02/01/2023 CLINICAL DATA:  Hypercalcemia EXAM: METASTATIC BONE SURVEY COMPARISON:  None Available. FINDINGS: No lytic or blastic bone lesions. Small to moderate left pleural effusion. Degenerative changes noted within the cervical spine at C5-6. 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 Distal left fibular ORIF has been performed. IMPRESSION: 1. No lytic or blastic bone lesions. 2. Small to moderate left pleural effusion. Electronically Signed   By: Fidela Salisbury M.D.   On: 02/01/2023 20:52   US RENAL  Result Date: 01/29/2023 CLINICAL DATA:  Acute renal failure EXAM: RENAL / URINARY TRACT ULTRASOUND COMPLETE COMPARISON:  Ultrasound abdomen 11/02/2022 FINDINGS: Right Kidney: Renal measurements: 11.6 x 6.2 x 6.2 cm  = volume: 233.6 mL. Echogenicity within normal limits. No mass or hydronephrosis visualized. Left Kidney: Renal measurements: 12.1 x 6.1 x 5.5 cm = volume: 212.8 mL. Echogenicity within normal limits. No mass or hydronephrosis visualized. Bladder: Decompressed Other: Ascites. IMPRESSION: 1. No hydronephrosis. 2. Ascites. Electronically Signed   By: Lovey Newcomer M.D.   On: 01/29/2023 12:41   US Paracentesis  Result Date: 01/27/2023 INDICATION: Alcoholic cirrhosis; recurrent ascites EXAM: ULTRASOUND GUIDED RLQ PARACENTESIS MEDICATIONS: 10 cc 1% lidocaine COMPLICATIONS: None immediate. PROCEDURE: Informed written consent was obtained from the patient after a discussion of the risks, benefits and alternatives to treatment. A timeout was performed prior to the initiation of the procedure. Initial ultrasound scanning demonstrates a large amount of ascites within the right lower abdominal quadrant. The right lower abdomen was prepped and draped in the usual sterile fashion. 1% lidocaine was used for local anesthesia. Following this, a 19 gauge, 7-cm, Yueh catheter was introduced. An ultrasound image was saved for documentation purposes. The paracentesis was performed. The catheter was removed and a dressing was applied. The patient tolerated the procedure well without immediate post procedural complication. Patient received post-procedure intravenous albumin; see  nursing notes for details. FINDINGS: A total of approximately 3.2 liters of yellow fluid was removed. Samples were sent to the laboratory as requested by the clinical team. IMPRESSION: Successful ultrasound-guided paracentesis yielding 3.2 liters of peritoneal fluid. PLAN: The patient has required >/=2 paracenteses in a 30 day period and a formal evaluation by the [facility] has been arranged. Read by Lavonia Drafts [facility] Electronically Signed   By: Ruthann Cancer M.D.   On: 01/27/2023 13:49   US  Paracentesis  Result Date: 01/04/2023 INDICATION: Ascites, alcoholic cirrhosis EXAM: ULTRASOUND GUIDED DIAGNOSTIC AND THERAPEUTIC PARACENTESIS MEDICATIONS: None. COMPLICATIONS: None immediate. PROCEDURE: Informed written consent was obtained from the patient after a discussion of the risks, benefits and alternatives to treatment. A timeout was performed prior to the initiation of the procedure. Initial ultrasound scanning demonstrates a large amount of ascites within the right lower abdominal quadrant. The right lower abdomen was prepped and draped in the usual sterile fashion. 1% lidocaine was used for local anesthesia. Following this, a 19 gauge, 7-cm, Yueh catheter was introduced. An ultrasound image was saved for documentation purposes. The paracentesis was performed. The catheter was removed and a dressing was applied. The patient tolerated the procedure well without immediate post procedural complication. FINDINGS: A total of approximately 1.6 L of yellow ascitic fluid was removed. Samples were sent to the laboratory as requested by the clinical team. IMPRESSION: Successful ultrasound-guided paracentesis yielding 1.6 liters of peritoneal fluid. Electronically Signed   By: Lavonia Dana M.D.   On: 01/04/2023 13:11     Discharge Exam: Vitals:   02/03/23 0230 02/03/23 0636  BP: 106/61 (!) 100/56  Pulse: (!) 112 100  Resp: 18 18  Temp: 99.5 F (37.5 C) 99.5 F (37.5 C)  SpO2: 100% 100%   Vitals:   02/02/23 1826 02/02/23 2230 02/03/23 0230 02/03/23 0636  BP: (!) 111/54 105/62 106/61 (!) 100/56  Pulse: (!) 114 (!) 116 (!) 112 100  Resp:  18 18 18   Temp: 100 F (37.8 C) 99.5 F (37.5 C) 99.5 F (37.5 C) 99.5 F (37.5 C)  TempSrc: Oral Oral Oral Oral  SpO2: 100% 97% 100% 100%  Weight:      Height:        General: Pt is alert, awake, not in acute distress Cardiovascular: RRR, S1/S2 +, no rubs, no gallops Respiratory: CTA bilaterally, no wheezing, no rhonchi Abdominal: Soft, NT, ND,  bowel sounds +, mild ascites present Extremities: no edema, no cyanosis    The results of significant diagnostics from this hospitalization (including imaging, microbiology, ancillary and laboratory) are listed below for reference.     Microbiology: Recent Results (from the past 240 hour(s))  Gram stain     Status: None   Collection Time: 01/27/23  1:05 PM   Specimen: Ascitic  Result Value Ref Range Status   Specimen Description ASCITIC  Final   Special Requests NONE  Final   Gram Stain   Final    NO ORGANISMS SEEN WBC PRESENT,BOTH PMN AND MONONUCLEAR CYTOSPIN SMEAR Performed at [facility], [address], [place], [place] [address]    Report Status 01/27/2023 FINAL  Final  Culture, body fluid w Gram Stain-bottle     Status: Abnormal   Collection Time: 01/27/23  1:05 PM   Specimen: Ascitic  Result Value Ref Range Status   Specimen Description   Final    ASCITIC Performed at [facility], [address] [place], [place] [address]  Special Requests   Final    BAA 10CC Performed at [facility], [address], [place], [place] [address]    Gram Stain   Final    GRAM POSITIVE COCCI ANAEROBIC BOTTLE ONLY Gram Stain Report Called to,Read Back By and Verified With: SANCHEZ, COURTNEY @1205  01/28/2023 BY FRATTO, A. Performed at [facility], [address] [place], [place] [address]    Culture STAPHYLOCOCCUS EPIDERMIDIS (A)  Final   Report Status 02/02/2023 FINAL  Final   Organism ID, Bacteria STAPHYLOCOCCUS EPIDERMIDIS  Final      Susceptibility   Staphylococcus epidermidis - MIC*    CIPROFLOXACIN <=0.5 SENSITIVE Sensitive     ERYTHROMYCIN <=0.25 SENSITIVE Sensitive     GENTAMICIN <=0.5 SENSITIVE Sensitive     OXACILLIN <=0.25 SENSITIVE Sensitive     TETRACYCLINE <=1 SENSITIVE Sensitive     VANCOMYCIN 1 SENSITIVE Sensitive     TRIMETH/SULFA <=10 SENSITIVE Sensitive     CLINDAMYCIN <=0.25 SENSITIVE Sensitive     RIFAMPIN <=0.5 SENSITIVE Sensitive     Inducible  Clindamycin NEGATIVE Sensitive     * STAPHYLOCOCCUS EPIDERMIDIS  Blood culture (routine x 2)     Status: None   Collection Time: 01/28/23 10:12 PM   Specimen: BLOOD  Result Value Ref Range Status   Specimen Description BLOOD BLOOD RIGHT ARM  Final   Special Requests   Final    Immunocompromised BOTTLES DRAWN AEROBIC AND ANAEROBIC Blood Culture adequate volume   Culture   Final    NO GROWTH 5 DAYS Performed at [facility], [address], [place], [place] [address]    Report Status 02/02/2023 FINAL  Final  Blood culture (routine x 2)     Status: None   Collection Time: 01/28/23 10:14 PM   Specimen: BLOOD  Result Value Ref Range Status   Specimen Description BLOOD BLOOD RIGHT HAND  Final   Special Requests   Final    Immunocompromised BOTTLES DRAWN AEROBIC AND ANAEROBIC Blood Culture results may not be optimal due to an inadequate volume of blood received in culture bottles   Culture   Final    NO GROWTH 5 DAYS Performed at [facility], [address], [place], [place] [address]    Report Status 02/02/2023 FINAL  Final     Labs: BNP (last 3 results) Recent Labs    12/20/22 1801  BNP [id number]*   Basic Metabolic Panel: Recent Labs  Lab 01/29/23 0121 01/29/23 0559 01/29/23 1715 01/30/23 0446 01/31/23 0508 02/01/23 0406 02/02/23 0416 02/03/23 0440  NA 135 136   < > 137 138 137 131* 131*  K 3.2* 3.4*   < > 3.1* 3.4* 3.3* 3.3* 3.1*  CL 101 105   < > 108 110 108 102 100  CO2 23 23   < > 22 21* 21* 22 24  GLUCOSE 102* 84   < > 68* 113* 86 119* 90  BUN 15 14   < > 10 6 <5* <5* <5*  CREATININE 1.25* 1.08*   < > 0.77 0.69 0.71 0.69 0.72  CALCIUM 13.5*  13.4* 12.8*   < > 11.3* 11.5* 11.7* 10.8* 9.6  MG  --  1.3*  --   --   --   --   --  1.1*  PHOS 3.6  --   --   --   --   --   --  1.6*   < > = values in this interval not displayed.   Liver Function Tests: Recent  Labs  Lab 01/29/23 1715 01/30/23 0446 01/31/23 0508 02/01/23 0406 02/02/23 0416 02/03/23 0440  AST 24 18  22 20 21   --   ALT 10 8 8 6 7   --   ALKPHOS 50 41 40 37* 50  --   BILITOT 1.9* 2.0* 2.1* 2.0* 1.6*  --   PROT 6.5 6.1* 6.3* 6.3* 6.4*  --   ALBUMIN 2.7* 2.9* 2.8* 2.9* 2.9* 2.7*   No results for input(s): "LIPASE", "AMYLASE" in the last 168 hours. No results for input(s): "AMMONIA" in the last 168 hours. CBC: Recent Labs  Lab 01/28/23 1310 01/28/23 2212 01/29/23 0559 01/30/23 0446 01/30/23 1357 01/31/23 0508 02/01/23 0406 02/03/23 0440  WBC 7.7 9.9 8.7 6.0  --  6.5 6.9  --   NEUTROABS 4.2 5.9 4.8  --   --   --   --   --   HGB 8.8* 9.3* 8.8* 6.7* 9.3* 8.6* 8.6* 7.7*  HCT 26.9* 28.3* 26.6* 20.3* 27.7* 26.1* 26.1* 23.1*  MCV 97.5 97.6 96.4 97.1  --  95.6 97.8  --   PLT 214 233 192 158  --  160 155  --    Cardiac Enzymes: No results for input(s): "CKTOTAL", "CKMB", "CKMBINDEX", "TROPONINI" in the last 168 hours. BNP: Invalid input(s): "POCBNP" CBG: No results for input(s): "GLUCAP" in the last 168 hours. D-Dimer No results for input(s): "DDIMER" in the last 72 hours. Hgb A1c No results for input(s): "HGBA1C" in the last 72 hours. Lipid Profile No results for input(s): "CHOL", "HDL", "LDLCALC", "TRIG", "CHOLHDL", "LDLDIRECT" in the last 72 hours. Thyroid function studies No results for input(s): "TSH", "T4TOTAL", "T3FREE", "THYROIDAB" in the last 72 hours.  Invalid input(s): "FREET3" Anemia work up No results for input(s): "VITAMINB12", "FOLATE", "FERRITIN", "TIBC", "IRON", "RETICCTPCT" in the last 72 hours. Urinalysis    Component Value Date/Time   COLORURINE YELLOW 01/29/2023 1600   APPEARANCEUR CLEAR 01/29/2023 1600   LABSPEC 1.015 01/29/2023 1600   PHURINE 6.0 01/29/2023 1600   GLUCOSEU NEGATIVE 01/29/2023 1600   HGBUR NEGATIVE 01/29/2023 1600   BILIRUBINUR NEGATIVE 01/29/2023 1600   KETONESUR NEGATIVE 01/29/2023 1600   PROTEINUR TRACE (A) 01/29/2023 1600   UROBILINOGEN 1.0 06/11/2014 0935   NITRITE NEGATIVE 01/29/2023 1600   LEUKOCYTESUR MODERATE (A)  01/29/2023 1600   Sepsis Labs Recent Labs  Lab 01/29/23 0559 01/30/23 0446 01/31/23 0508 02/01/23 0406  WBC 8.7 6.0 6.5 6.9   Microbiology Recent Results (from the past 240 hour(s))  Gram stain     Status: None   Collection Time: 01/27/23  1:05 PM   Specimen: Ascitic  Result Value Ref Range Status   Specimen Description ASCITIC  Final   Special Requests NONE  Final   Gram Stain   Final    NO ORGANISMS SEEN WBC PRESENT,BOTH PMN AND MONONUCLEAR CYTOSPIN SMEAR Performed at [facility], [address], [place], [place] [address]    Report Status 01/27/2023 FINAL  Final  Culture, body fluid w Gram Stain-bottle     Status: Abnormal   Collection Time: 01/27/23  1:05 PM   Specimen: Ascitic  Result Value Ref Range Status   Specimen Description   Final    ASCITIC Performed at [facility], [address], [place], [place] [address]    Special Requests   Final    BAA 10CC Performed at [facility], [address] [place], [place] [address]    Gram Stain   Final    GRAM POSITIVE COCCI ANAEROBIC BOTTLE ONLY Gram  Stain Report Called to,Read Back By and Verified With: SANCHEZ, COURTNEY @1205  01/28/2023 BY FRATTO, A. Performed at [facility], [address] [place], [place] [address]    Culture STAPHYLOCOCCUS EPIDERMIDIS (A)  Final   Report Status 02/02/2023 FINAL  Final   Organism ID, Bacteria STAPHYLOCOCCUS EPIDERMIDIS  Final      Susceptibility   Staphylococcus epidermidis - MIC*    CIPROFLOXACIN <=0.5 SENSITIVE Sensitive     ERYTHROMYCIN <=0.25 SENSITIVE Sensitive     GENTAMICIN <=0.5 SENSITIVE Sensitive     OXACILLIN <=0.25 SENSITIVE Sensitive     TETRACYCLINE <=1 SENSITIVE Sensitive     VANCOMYCIN 1 SENSITIVE Sensitive     TRIMETH/SULFA <=10 SENSITIVE Sensitive     CLINDAMYCIN <=0.25 SENSITIVE Sensitive     RIFAMPIN <=0.5 SENSITIVE Sensitive     Inducible Clindamycin NEGATIVE Sensitive     * STAPHYLOCOCCUS EPIDERMIDIS  Blood culture (routine x 2)     Status: None    Collection Time: 01/28/23 10:12 PM   Specimen: BLOOD  Result Value Ref Range Status   Specimen Description BLOOD BLOOD RIGHT ARM  Final   Special Requests   Final    Immunocompromised BOTTLES DRAWN AEROBIC AND ANAEROBIC Blood Culture adequate volume   Culture   Final    NO GROWTH 5 DAYS Performed at [facility], [address], [place], [place] [address]    Report Status 02/02/2023 FINAL  Final  Blood culture (routine x 2)     Status: None   Collection Time: 01/28/23 10:14 PM   Specimen: BLOOD  Result Value Ref Range Status   Specimen Description BLOOD BLOOD RIGHT HAND  Final   Special Requests   Final    Immunocompromised BOTTLES DRAWN AEROBIC AND ANAEROBIC Blood Culture results may not be optimal due to an inadequate volume of blood received in culture bottles   Culture   Final    NO GROWTH 5 DAYS Performed at [facility], [address] [place], [place] [address]    Report Status 02/02/2023 FINAL  Final     Time coordinating discharge: 35 minutes  SIGNED:   Rodena Goldmann, DO [facility] 02/03/2023, 11:00 AM  If 7PM-7AM, please contact night-coverage [email or web address]

## 2023-02-03 NOTE — Progress Notes (Signed)
Patient ID: [name], female   DOB: Apr 07, 1981, 42 y.o.   MRN: [id number] S: Feels well, no complaints O:BP (!) 100/56 (BP Location: Right Arm)   Pulse 100   Temp 99.5 F (37.5 C) (Oral)   Resp 18   Ht 5\' 4"  (1.626 m)   Wt 57.2 kg   SpO2 100%   BMI 21.65 kg/m   Intake/Output Summary (Last 24 hours) at 02/03/2023 1142 Last data filed at 02/02/2023 2242 Gross per 24 hour  Intake 240 ml  Output 200 ml  Net 40 ml   Intake/Output: I/O last 3 completed shifts: In: 1010.3 [P.O.:480; I.V.:530.3] Out: 700 [Urine:700]  Intake/Output this shift:  No intake/output data recorded. Weight change:  Gen: NAD CVS: RRR Resp: CTA Abd: distended, +BS, soft, + fluid wave Ext: no edema  Recent Labs  Lab 01/29/23 0121 01/29/23 0559 01/29/23 1715 01/30/23 0446 01/31/23 0508 02/01/23 0406 02/02/23 0416 02/03/23 0440  NA 135 136 135 137 138 137 131* 131*  K 3.2* 3.4* 3.2* 3.1* 3.4* 3.3* 3.3* 3.1*  CL 101 105 104 108 110 108 102 100  CO2 23 23 24 22  21* 21* 22 24  GLUCOSE 102* 84 87 68* 113* 86 119* 90  BUN 15 14 13 10 6  <5* <5* <5*  CREATININE 1.25* 1.08* 0.93 0.77 0.69 0.71 0.69 0.72  ALBUMIN 2.8* 2.6* 2.7* 2.9* 2.8* 2.9* 2.9* 2.7*  CALCIUM 13.5*  13.4* 12.8* 11.9* 11.3* 11.5* 11.7* 10.8* 9.6  PHOS 3.6  --   --   --   --   --   --  1.6*  AST 26 25 24 18 22 20 21   --   ALT 9 10 10 8 8 6 7   --    Liver Function Tests: Recent Labs  Lab 01/31/23 0508 02/01/23 0406 02/02/23 0416 02/03/23 0440  AST 22 20 21   --   ALT 8 6 7   --   ALKPHOS 40 37* 50  --   BILITOT 2.1* 2.0* 1.6*  --   PROT 6.3* 6.3* 6.4*  --   ALBUMIN 2.8* 2.9* 2.9* 2.7*   No results for input(s): "LIPASE", "AMYLASE" in the last 168 hours. No results for input(s): "AMMONIA" in the last 168 hours. CBC: Recent Labs  Lab 01/28/23 1310 01/28/23 2212 01/29/23 0559 01/30/23 0446 01/30/23 1357 01/31/23 0508 02/01/23 0406 02/03/23 0440  WBC 7.7 9.9 8.7 6.0  --  6.5 6.9  --   NEUTROABS 4.2 5.9 4.8  --   --    --   --   --   HGB 8.8* 9.3* 8.8* 6.7*   < > 8.6* 8.6* 7.7*  HCT 26.9* 28.3* 26.6* 20.3*   < > 26.1* 26.1* 23.1*  MCV 97.5 97.6 96.4 97.1  --  95.6 97.8  --   PLT 214 233 192 158  --  160 155  --    < > = values in this interval not displayed.   Cardiac Enzymes: No results for input(s): "CKTOTAL", "CKMB", "CKMBINDEX", "TROPONINI" in the last 168 hours. CBG: No results for input(s): "GLUCAP" in the last 168 hours.  Iron Studies: No results for input(s): "IRON", "TIBC", "TRANSFERRIN", "FERRITIN" in the last 72 hours. Studies/Results: DG Bone Survey Met  Result Date: 02/01/2023 CLINICAL DATA:  Hypercalcemia EXAM: METASTATIC BONE SURVEY COMPARISON:  None Available. FINDINGS: No lytic or blastic bone lesions. Small to moderate left pleural effusion. Degenerative changes noted within the cervical spine at C5-6. 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 Distal left fibular ORIF has been performed. IMPRESSION: 1. No lytic or blastic bone lesions. 2. Small to moderate left pleural effusion. Electronically Signed   By: Fidela Salisbury M.D.   On: [id number]    folic acid  1 mg Oral Daily   furosemide  40 mg Oral Daily   gabapentin  100 mg Oral BID   heparin  5,000 Units Subcutaneous Q8H   lactulose  10 g Oral TID   melatonin  3 mg Oral QHS   metroNIDAZOLE  500 mg Oral Q12H   mirtazapine  7.5 mg Oral QHS   nicotine  21 mg Transdermal Daily   pantoprazole  40 mg Oral BID   potassium chloride  40 mEq Oral BID   rifaximin  550 mg Oral BID   ursodiol  300 mg Oral TID    BMET    Component Value Date/Time   NA 131 (L) 02/03/2023 0440   K 3.1 (L) 02/03/2023 0440   CL 100 02/03/2023 0440   CO2 24 02/03/2023 0440   GLUCOSE 90 02/03/2023 0440   BUN <5 (L) 02/03/2023 0440   CREATININE 0.72 02/03/2023 0440   CREATININE 0.59 08/12/2020 1602   CALCIUM 9.6 02/03/2023 0440   GFRNONAA >60 02/03/2023 0440    GFRNONAA 115 08/12/2020 1602   GFRAA 134 08/12/2020 1602   CBC    Component Value Date/Time   WBC 6.9 02/01/2023 0406   RBC 2.67 (L) 02/01/2023 0406   HGB 7.7 (L) 02/03/2023 0440   HCT 23.1 (L) 02/03/2023 0440   PLT 155 02/01/2023 0406   MCV 97.8 02/01/2023 0406   MCH 32.2 02/01/2023 0406   MCHC 33.0 02/01/2023 0406   RDW 16.3 (H) 02/01/2023 0406   LYMPHSABS 2.7 01/29/2023 0559   MONOABS 0.9 01/29/2023 0559   EOSABS 0.3 01/29/2023 0559   BASOSABS 0.0 01/29/2023 0559    Assessment/Plan:   AKI- non-oliguric and presumably due to hypercalcemia and large volume paracentesis.  Scr improved after holding diuretics and IVF's.  Renal US without hydronephrosis.   Hypercalcemia - workup underway.  iPTH appropriately low at 6, PTH related peptide, SPEP/UPEP pending.  Slowly improving, however concerning for underlying malignancy as not resolved despite stopping spironolactone.  Calcium improved after starting IV lasix.  S/p calcitonin x 4 doses.  Bone survey negative for lytic bone lesions.  Restart po lasix 40 mg daily and continue to hold spironolactone.  F/u with PCP and GI. Hyponatremia - due to cirrhosis and ascites.  Stop IV fluids and start po lasix above. 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  GI following.  S/p IV albumin, lactulose, rifaximin.   Anemia - normocytic.  EGD on 01/29/23 limited evaluation.  F/u EGD and colonoscopy as an outpatient.  GI following.  Transfused 1 unit.  Continue to follow H/H and transfuse as needed.  Disposition - stable for discharge.  No need for outpatient follow up.  Donetta Potts, MD [facility]

## 2023-02-03 NOTE — Progress Notes (Signed)
Mobility Specialist Progress Note:    02/03/23 1015  Mobility  Activity Ambulated independently in hallway  Level of Assistance Independent  Assistive Device None  Distance Ambulated (ft) 400 ft  Activity Response Tolerated well  Mobility Referral Yes  $Mobility charge 1 Mobility   Pt agreeable to mobility session. Tolerated well, asx throughout. Left pt in room, all needs met, call bell in reach.   Royetta Crochet Mobility Specialist Please contact via Solicitor or  Rehab office at [phone]

## 2023-02-04 LAB — PROTEIN ELECTROPHORESIS, SERUM
A/G Ratio: 0.8 (ref 0.7–1.7)
Albumin ELP: 2.9 g/dL (ref 2.9–4.4)
Alpha-1-Globulin: 0.3 g/dL (ref 0.0–0.4)
Alpha-2-Globulin: 0.4 g/dL (ref 0.4–1.0)
Beta Globulin: 0.8 g/dL (ref 0.7–1.3)
Gamma Globulin: 2 g/dL — ABNORMAL HIGH (ref 0.4–1.8)
Globulin, Total: 3.5 g/dL (ref 2.2–3.9)
Total Protein ELP: 6.4 g/dL (ref 6.0–8.5)

## 2023-02-05 ENCOUNTER — Telehealth: Payer: Self-pay | Admitting: *Deleted

## 2023-02-05 LAB — PTH-RELATED PEPTIDE: PTH-related peptide: <2 pmol/L

## 2023-02-05 NOTE — Transitions of Care (Post Inpatient/ED Visit) (Signed)
   02/05/2023  Name: Mallory Mendoza MRN: [id number] DOB: 1981/11/07  Today's TOC FU Call Status: Today's TOC FU Call Status:: Successful TOC FU Call Competed TOC FU Call Complete Date: 02/05/23  Transition Care Management Follow-up Telephone Call Date of Discharge: 02/03/23 Discharge [facility] Type of Discharge: Inpatient Admission Primary Inpatient Discharge Diagnosis:: AKI secondary to hypercalcimia How have you been since you were released from the hospital?: Same Any questions or concerns?: No  Items Reviewed: Did you receive and understand the discharge instructions provided?: Yes Medications obtained and verified?: Yes (Medications Reviewed) Any new allergies since your discharge?: No Dietary orders reviewed?: Yes Type of Diet Ordered:: Heart Healthy Do you have support at home?: Yes Name of Support/Comfort Primary Source: Family/name not provided  Home Care and Equipment/Supplies: [place] Ordered?: No Any new equipment or medical supplies ordered?: No  Functional Questionnaire: Do you need assistance with bathing/showering or dressing?: No Do you need assistance with meal preparation?: No Do you need assistance with eating?: No Do you have difficulty maintaining continence: No Do you need assistance with getting out of bed/getting out of a chair/moving?: No Do you have difficulty managing or taking your medications?: No  Follow up appointments reviewed: PCP Follow-up appointment confirmed?: No MD Provider Line Number:[phone] Given: No (Patient has the information and will call to schedule an appointment) [facility] Follow-up appointment confirmed?: No Reason Specialist Follow-Up Not Confirmed: Patient has Specialist Provider Number and will Call for Appointment Do you need transportation to your follow-up appointment?: Yes Transportation Need Intervention Addressed By:: Other: (Provided patient with [employer] medical  transportation [phone]) Do you understand care options if your condition(s) worsen?: Yes-patient verbalized understanding  SDOH Interventions Today    Flowsheet Row Most Recent Value  SDOH Interventions   Transportation Interventions Payor Benefit  [Provided patient with [employer] medical transportation [phone]     The patient was given information about care management services as a benefit of their Medicaid health plan today.   Patient                                              did not agree to enrollment in care management services and does not wish to consider at this time.   Lurena Joiner RN, BSN [facility] RN Care Coordinator [phone]

## 2023-02-08 ENCOUNTER — Other Ambulatory Visit: Payer: Self-pay | Admitting: *Deleted

## 2023-02-08 ENCOUNTER — Telehealth: Payer: Self-pay | Admitting: *Deleted

## 2023-02-08 DIAGNOSIS — K7031 Alcoholic cirrhosis of liver with ascites: Secondary | ICD-10-CM

## 2023-02-08 NOTE — Telephone Encounter (Signed)
Noted  

## 2023-02-08 NOTE — Telephone Encounter (Signed)
Tammy, please arrange paracentesis. Max 5L. 25g of 25% albumin at the start. Needs labs- body cell count, culture, and gram stain.   Mandy: As Loma Sousa, NP had previously recommended, patient needs hospital follow-up within the next couple of weeks.

## 2023-02-08 NOTE — Telephone Encounter (Signed)
[facility]  Para scheduled for 02/09/23, arrive at 9:45 am to check in.

## 2023-02-08 NOTE — Telephone Encounter (Signed)
Pt called and states she is swelling with fluid and needs a paracentesis. She states Dr.Carver was suppose to put in a standing order for her to have a para and she called the hospital and they told her to call the office.

## 2023-02-08 NOTE — Telephone Encounter (Signed)
Opened in error

## 2023-02-09 ENCOUNTER — Ambulatory Visit (HOSPITAL_COMMUNITY): Admission: RE | Admit: 2023-02-09 | Payer: Medicaid Other | Source: Ambulatory Visit

## 2023-02-09 ENCOUNTER — Encounter (HOSPITAL_COMMUNITY): Payer: Self-pay | Admitting: Internal Medicine

## 2023-02-09 ENCOUNTER — Telehealth: Payer: Self-pay | Admitting: *Deleted

## 2023-02-09 NOTE — Telephone Encounter (Signed)
Pt called and states she did not have paracentesis done today, because she didn't received the message. I informed her the order was in there and she can call Central Scheduling to reschedule. She voiced understanding.

## 2023-02-09 NOTE — Telephone Encounter (Signed)
Noted. Agree with recommendations.  

## 2023-02-09 NOTE — Telephone Encounter (Signed)
Pt called and she missed the appointment for the para today. Gave pt number to central scheduling to call and reschedule.

## 2023-02-11 ENCOUNTER — Encounter (HOSPITAL_COMMUNITY): Payer: Self-pay

## 2023-02-11 ENCOUNTER — Ambulatory Visit (HOSPITAL_COMMUNITY)
Admission: RE | Admit: 2023-02-11 | Discharge: 2023-02-11 | Disposition: A | Discharge status: Home / Self Care | Payer: Medicaid Other | Source: Ambulatory Visit | Attending: Gastroenterology | Admitting: Gastroenterology

## 2023-02-11 DIAGNOSIS — R188 Other ascites: Secondary | ICD-10-CM | POA: Diagnosis not present

## 2023-02-11 DIAGNOSIS — K7031 Alcoholic cirrhosis of liver with ascites: Secondary | ICD-10-CM | POA: Diagnosis present

## 2023-02-11 DIAGNOSIS — K7460 Unspecified cirrhosis of liver: Secondary | ICD-10-CM | POA: Diagnosis not present

## 2023-02-11 LAB — BODY FLUID CELL COUNT WITH DIFFERENTIAL
Eos, Fluid: 0 %
Lymphs, Fluid: 51 %
Monocyte-Macrophage-Serous Fluid: 43 % — ABNORMAL LOW (ref 50–90)
Neutrophil Count, Fluid: 6 % (ref 0–25)
Total Nucleated Cell Count, Fluid: 278 cu mm (ref 0–1000)

## 2023-02-11 LAB — GRAM STAIN: Gram Stain: NONE SEEN

## 2023-02-11 MED ORDER — ALBUMIN HUMAN 25 % IV SOLN
INTRAVENOUS | Status: AC
  Administered 2023-02-11: 25 g via INTRAVENOUS
  Filled 2023-02-11: qty 100

## 2023-02-11 MED ORDER — ALBUMIN HUMAN 25 % IV SOLN
0.0000 g | Freq: Once | INTRAVENOUS | Status: AC
  Filled 2023-02-11: qty 400

## 2023-02-11 NOTE — Procedures (Addendum)
   US guided RLQ paracentesis 4.3 Liters yellow fluid Sent for labs per MD Tolerated well  EBL: scant

## 2023-02-11 NOTE — Progress Notes (Signed)
Paracentesis complete 4.3L ascites removed and 25G Albumin given. No signs of distress.

## 2023-02-12 LAB — PATHOLOGIST SMEAR REVIEW

## 2023-02-17 LAB — CULTURE, BODY FLUID W GRAM STAIN -BOTTLE: Culture: NO GROWTH

## 2023-02-17 NOTE — Progress Notes (Signed)
   Portal Hypertension Clinic Screening Evaluation   Indication for evaluation: Mallory Mendoza is a 42 y.o. female undergoing preliminary evaluation in the [facility] Interventional Radiology Portal Hypertension Clinic due to recurrent ascites.  Referring Physician/Established Gastroenterologist:  Hurshel Keys, DO  Etiology of cirrhosis: EtOH Initially diagnosed: 2023 # of paracentesis in last month: 2 # of paracentesis in last 2 months: 4 History of hepatic hydrothorax:  no History of hepatic encephalopathy: yes  Prior evaluation for liver transplant: no History of hepatocellular carcinoma: no  Prior esophagogastroduodenoscopy/intervention: 01/29/23 - no varices Current esophageal varices: no Current gastric varices: no History of hematemesis: no  Current diuretic regimen: furosemide 40 mg QD Current pharmacologic encephalopathy prophylaxis/treatment: lactulose 20 g TID, rifaximin 550 mg BID  History of renal dysfunction: hx AKI, resolved History of hemodialysis: no  History of cardiac dysfunction: no  Other pertinent past medical history: anxiety, depression   Imaging: Prior cross sectional imaging of portal system: [facility] 12/20/22   Echocardiogram: 12/21/22 1. Left ventricular ejection fraction, by estimation, is 65 to 70%. The  left ventricle has normal function. The left ventricle has no regional  wall motion abnormalities. Left ventricular diastolic parameters were  normal.   2. Right ventricular systolic function is normal. The right ventricular  size is normal. Tricuspid regurgitation signal is inadequate for assessing  PA pressure.   3. Left atrial size was mildly dilated.   4. The mitral valve is normal in structure. No evidence of mitral valve  regurgitation. No evidence of mitral stenosis.   5. The tricuspid valve is abnormal.   6. The aortic valve is tricuspid. Aortic valve regurgitation is not  visualized. No aortic stenosis is present.     Labs: 3/14-20/24 Creatinine: 0.72 Total Bilirubin: 1.6 INR: 1.5 Sodium: 131 Albumin: 2.7  Child-Pugh = 10 points, class C MELD = 13 (6.0% estimated 3 month mortality) [place] Index of Post-TIPS Survival (FIPS) = -0.51 (overall survival predicted at 1 month 97.3%, 3 months 91.0%, and 6 months 86.7%)    Assessment: Mallory Mendoza is a 42 y.o. female with history of alcoholic cirrhosis (Child Pugh C10, MELD 13) with recurrent ascites.  After preliminary evaluation, this patient would be a potential candidate for TIPS creation if ascites is truly refractory after maximization of diuresis, or if she is unable to tolerate increased diuresis due to renal function.  If she remains encephalopathic despite optimized medical prophylaxis, I would caution against TIPS creation.  Recommendation: Formal consult for TIPS creation could be considered as above.  The patient's Gastroenterologist, Dr. Abbey Chatters, will be contacted for further discussion.    Electronically Signed: Suzette Battiest, MD 02/17/2023, 3:48 PM
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
## 2023-03-03 ENCOUNTER — Ambulatory Visit: Payer: Medicaid Other | Admitting: Gastroenterology

## 2023-03-03 ENCOUNTER — Telehealth: Payer: Self-pay | Admitting: *Deleted

## 2023-03-03 NOTE — Telephone Encounter (Signed)
Pt called and states she is swelling in her abdomin. She states she needs a paracentesis. Not short of breath or swelling in legs.

## 2023-03-04 ENCOUNTER — Other Ambulatory Visit: Payer: Self-pay | Admitting: *Deleted

## 2023-03-04 DIAGNOSIS — R188 Other ascites: Secondary | ICD-10-CM

## 2023-03-04 NOTE — Telephone Encounter (Signed)
I will order 1 more paracentesis, but she needs to be seen in the office before we can continue to order these. She should be able to get in next week to see someone.   Mindy:  Please arrange paracentesis.  Max 5L 25g of 25% albumin at the start Body fluid cell count, culture, and gram stain.

## 2023-03-04 NOTE — Telephone Encounter (Signed)
Noted  

## 2023-03-04 NOTE — Telephone Encounter (Signed)
Called pt. Aware para appt tomorrow. Also moved her FU to next week. She is aware

## 2023-03-04 NOTE — Telephone Encounter (Signed)
Patient called in checking on the status of getting a para scheduled for her.

## 2023-03-05 ENCOUNTER — Ambulatory Visit (HOSPITAL_COMMUNITY)
Admission: RE | Admit: 2023-03-05 | Discharge: 2023-03-05 | Disposition: A | Discharge status: Home / Self Care | Payer: Medicaid Other | Source: Ambulatory Visit | Attending: Gastroenterology | Admitting: Gastroenterology

## 2023-03-05 VITALS — BP 94/61 | Temp 98.0°F

## 2023-03-05 DIAGNOSIS — K7031 Alcoholic cirrhosis of liver with ascites: Secondary | ICD-10-CM | POA: Insufficient documentation

## 2023-03-05 DIAGNOSIS — R188 Other ascites: Secondary | ICD-10-CM | POA: Diagnosis not present

## 2023-03-05 LAB — GRAM STAIN

## 2023-03-05 LAB — BODY FLUID CELL COUNT WITH DIFFERENTIAL
Eos, Fluid: 0 %
Lymphs, Fluid: 39 %
Monocyte-Macrophage-Serous Fluid: 47 % — ABNORMAL LOW (ref 50–90)
Neutrophil Count, Fluid: 14 % (ref 0–25)
Total Nucleated Cell Count, Fluid: 239 cu mm (ref 0–1000)

## 2023-03-05 MED ORDER — ALBUMIN HUMAN 25 % IV SOLN
INTRAVENOUS | Status: AC
  Filled 2023-03-05: qty 100

## 2023-03-05 MED ORDER — ALBUMIN HUMAN 25 % IV SOLN
25.0000 g | Freq: Once | INTRAVENOUS | Status: AC
  Administered 2023-03-05: 25 g via INTRAVENOUS

## 2023-03-05 NOTE — Procedures (Signed)
PROCEDURE SUMMARY:  Successful ultrasound guided paracentesis from the right lower quadrant.  Yielded 4.6 L of clear yellow fluid.  No immediate complications.  The patient tolerated the procedure well.   Specimen sent for labs.  EBL < 2 mL  The patient has required >/=2 paracenteses in a 30 day period and a screening evaluation by the [facility] Interventional Radiology Portal Hypertension Clinic has been arranged.  ** Please see Dr. Jerrye Noble note dated 3/28/24Alwyn Mendoza, AGACNP-BC [phone] 03/05/2023, 9:52 AM

## 2023-03-05 NOTE — Progress Notes (Signed)
Pt escorted to [place] suite, ambulatory, no obvious distress. Abdomen distended. Placed on stretcher, procedure explained, consent obtained. Prepped/draped in sterile fashion. Anesthetic admin without complication. Access obtained at RLQ without difficulty. Clear serous fluid retrieved per syringe, connected to vacutainer suction.4.6 L total accumulated. Tolerated well. Access removed, dermabond applied, hemostasis, no hematoma. No obvious distress. Escorted to radiology waiting area ambulatory.
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
## 2023-03-08 ENCOUNTER — Other Ambulatory Visit: Payer: Self-pay

## 2023-03-08 ENCOUNTER — Telehealth: Payer: Self-pay

## 2023-03-08 ENCOUNTER — Inpatient Hospital Stay (HOSPITAL_COMMUNITY)
Admission: EM | Admit: 2023-03-08 | Discharge: 2023-03-12 | DRG: 378 | Disposition: A | Discharge status: Home / Self Care | Payer: Medicaid Other | Attending: Family Medicine | Admitting: Family Medicine

## 2023-03-08 ENCOUNTER — Encounter (HOSPITAL_COMMUNITY): Payer: Self-pay

## 2023-03-08 DIAGNOSIS — F32A Depression, unspecified: Secondary | ICD-10-CM | POA: Diagnosis present

## 2023-03-08 DIAGNOSIS — K921 Melena: Secondary | ICD-10-CM

## 2023-03-08 DIAGNOSIS — R000 Tachycardia, unspecified: Secondary | ICD-10-CM | POA: Diagnosis present

## 2023-03-08 DIAGNOSIS — Z6821 Body mass index (BMI) 21.0-21.9, adult: Secondary | ICD-10-CM

## 2023-03-08 DIAGNOSIS — N939 Abnormal uterine and vaginal bleeding, unspecified: Secondary | ICD-10-CM | POA: Diagnosis present

## 2023-03-08 DIAGNOSIS — R197 Diarrhea, unspecified: Secondary | ICD-10-CM | POA: Diagnosis present

## 2023-03-08 DIAGNOSIS — D649 Anemia, unspecified: Secondary | ICD-10-CM | POA: Diagnosis present

## 2023-03-08 DIAGNOSIS — K766 Portal hypertension: Secondary | ICD-10-CM | POA: Diagnosis present

## 2023-03-08 DIAGNOSIS — K922 Gastrointestinal hemorrhage, unspecified: Secondary | ICD-10-CM | POA: Diagnosis present

## 2023-03-08 DIAGNOSIS — R21 Rash and other nonspecific skin eruption: Secondary | ICD-10-CM | POA: Diagnosis present

## 2023-03-08 DIAGNOSIS — E8809 Other disorders of plasma-protein metabolism, not elsewhere classified: Secondary | ICD-10-CM | POA: Diagnosis present

## 2023-03-08 DIAGNOSIS — G4700 Insomnia, unspecified: Secondary | ICD-10-CM | POA: Diagnosis present

## 2023-03-08 DIAGNOSIS — Z79899 Other long term (current) drug therapy: Secondary | ICD-10-CM

## 2023-03-08 DIAGNOSIS — K2961 Other gastritis with bleeding: Principal | ICD-10-CM | POA: Diagnosis present

## 2023-03-08 DIAGNOSIS — K219 Gastro-esophageal reflux disease without esophagitis: Secondary | ICD-10-CM | POA: Diagnosis present

## 2023-03-08 DIAGNOSIS — K3189 Other diseases of stomach and duodenum: Secondary | ICD-10-CM | POA: Diagnosis present

## 2023-03-08 DIAGNOSIS — E440 Moderate protein-calorie malnutrition: Secondary | ICD-10-CM | POA: Diagnosis present

## 2023-03-08 DIAGNOSIS — K7031 Alcoholic cirrhosis of liver with ascites: Secondary | ICD-10-CM | POA: Diagnosis present

## 2023-03-08 DIAGNOSIS — R748 Abnormal levels of other serum enzymes: Secondary | ICD-10-CM | POA: Diagnosis present

## 2023-03-08 DIAGNOSIS — F419 Anxiety disorder, unspecified: Secondary | ICD-10-CM | POA: Diagnosis present

## 2023-03-08 DIAGNOSIS — F17210 Nicotine dependence, cigarettes, uncomplicated: Secondary | ICD-10-CM | POA: Diagnosis present

## 2023-03-08 DIAGNOSIS — R112 Nausea with vomiting, unspecified: Secondary | ICD-10-CM | POA: Diagnosis present

## 2023-03-08 DIAGNOSIS — L299 Pruritus, unspecified: Secondary | ICD-10-CM | POA: Diagnosis present

## 2023-03-08 DIAGNOSIS — G8929 Other chronic pain: Secondary | ICD-10-CM | POA: Diagnosis present

## 2023-03-08 DIAGNOSIS — Z8249 Family history of ischemic heart disease and other diseases of the circulatory system: Secondary | ICD-10-CM

## 2023-03-08 DIAGNOSIS — K743 Primary biliary cirrhosis: Secondary | ICD-10-CM

## 2023-03-08 DIAGNOSIS — E872 Acidosis, unspecified: Secondary | ICD-10-CM | POA: Diagnosis present

## 2023-03-08 DIAGNOSIS — K7030 Alcoholic cirrhosis of liver without ascites: Secondary | ICD-10-CM | POA: Diagnosis present

## 2023-03-08 DIAGNOSIS — E876 Hypokalemia: Secondary | ICD-10-CM | POA: Diagnosis present

## 2023-03-08 DIAGNOSIS — R109 Unspecified abdominal pain: Secondary | ICD-10-CM | POA: Diagnosis present

## 2023-03-08 LAB — CBC
HCT: 32.2 % — ABNORMAL LOW (ref 36.0–46.0)
Hemoglobin: 10.4 g/dL — ABNORMAL LOW (ref 12.0–15.0)
MCH: 31.2 pg (ref 26.0–34.0)
MCHC: 32.3 g/dL (ref 30.0–36.0)
MCV: 96.7 fL (ref 80.0–100.0)
Platelets: 230 10*3/uL (ref 150–400)
RBC: 3.33 MIL/uL — ABNORMAL LOW (ref 3.87–5.11)
RDW: 15.6 % — ABNORMAL HIGH (ref 11.5–15.5)
WBC: 9.3 10*3/uL (ref 4.0–10.5)
nRBC: 0 % (ref 0.0–0.2)

## 2023-03-08 LAB — URINALYSIS, ROUTINE W REFLEX MICROSCOPIC
Bilirubin Urine: NEGATIVE
Cellular Cast, UA: 8
Glucose, UA: NEGATIVE mg/dL
Ketones, ur: 5 mg/dL — AB
Leukocytes,Ua: NEGATIVE
Nitrite: NEGATIVE
Protein, ur: 30 mg/dL — AB
Renal Epithelial: 1
Specific Gravity, Urine: 1.025 (ref 1.005–1.030)
pH: 5 (ref 5.0–8.0)

## 2023-03-08 LAB — COMPREHENSIVE METABOLIC PANEL
ALT: 9 U/L (ref 0–44)
AST: 22 U/L (ref 15–41)
Albumin: 3 g/dL — ABNORMAL LOW (ref 3.5–5.0)
Alkaline Phosphatase: 63 U/L (ref 38–126)
Anion gap: 10 (ref 5–15)
BUN: 17 mg/dL (ref 6–20)
CO2: 17 mmol/L — ABNORMAL LOW (ref 22–32)
Calcium: 8.8 mg/dL — ABNORMAL LOW (ref 8.9–10.3)
Chloride: 109 mmol/L (ref 98–111)
Creatinine, Ser: 1.07 mg/dL — ABNORMAL HIGH (ref 0.44–1.00)
GFR, Estimated: >60 mL/min (ref >60)
Glucose, Bld: 86 mg/dL (ref 70–99)
Potassium: 3.6 mmol/L (ref 3.5–5.1)
Sodium: 136 mmol/L (ref 135–145)
Total Bilirubin: 1.6 mg/dL — ABNORMAL HIGH (ref 0.3–1.2)
Total Protein: 7.4 g/dL (ref 6.5–8.1)

## 2023-03-08 LAB — POC OCCULT BLOOD, ED: Fecal Occult Bld: POSITIVE — AB

## 2023-03-08 LAB — LIPASE, BLOOD: Lipase: 61 U/L — ABNORMAL HIGH (ref 11–51)

## 2023-03-08 LAB — PREGNANCY, URINE: Preg Test, Ur: NEGATIVE

## 2023-03-08 MED ORDER — SODIUM CHLORIDE 0.9 % IV BOLUS
1000.0000 mL | Freq: Once | INTRAVENOUS | Status: AC
  Administered 2023-03-09: 1000 mL via INTRAVENOUS

## 2023-03-08 MED ORDER — ONDANSETRON HCL 4 MG/2ML IJ SOLN
4.0000 mg | Freq: Once | INTRAMUSCULAR | Status: AC
  Administered 2023-03-09: 4 mg via INTRAVENOUS
  Filled 2023-03-08: qty 2

## 2023-03-08 NOTE — ED Notes (Signed)
Ambulatory to restroom

## 2023-03-08 NOTE — ED Notes (Signed)
Pt noted to be eating snacks and drinking soda while in the waiting room.

## 2023-03-08 NOTE — Telephone Encounter (Signed)
Message sent to my vm : pt states she had been trying to call since last week and no one returned her call but she is at ED

## 2023-03-08 NOTE — ED Triage Notes (Signed)
Pt reports diarrhea and nausea since paracentesis on Friday.

## 2023-03-08 NOTE — ED Notes (Signed)
Pt back from restroom

## 2023-03-08 NOTE — ED Provider Notes (Signed)
[place] EMERGENCY DEPARTMENT AT [facility]  Provider Note  CSN: [id number] Arrival date & time: 03/08/23 1556  History Chief Complaint  Patient presents with   Diarrhea    Mallory Mendoza is a 42 y.o. female with history of alcoholic cirrhosis had paracentesis 3 days ago (no growth from cultures). Reports she has had nausea and frequent diarrhea since then. She reports she had a black stool while in the waiting room. She had had 'pain all over', not able to specify a particular location. She denies fever. Had one episode of vomiting but mostly just nausea and has been able to eat.    Home Medications Prior to Admission medications   Medication Sig Start Date End Date Taking? Authorizing Provider  folic acid (FOLVITE) 1 MG tablet Take 1 mg by mouth daily. 01/10/23   [provider]  furosemide (LASIX) 40 MG tablet Take 1 tablet (40 mg total) by mouth every morning. 12/07/22   Vassie Loll, MD  gabapentin (NEURONTIN) 100 MG capsule Take 100 mg by mouth 2 (two) times daily. 12/02/22   [provider]  hydrOXYzine (ATARAX) 10 MG tablet Take 10 mg by mouth 3 (three) times daily as needed for itching.    [provider]  Ibuprofen-diphenhydrAMINE HCl (ADVIL PM) 200-25 MG CAPS Take 2 tablets by mouth at bedtime. Patient not taking: Reported on 02/05/2023    [provider]  lactulose (CHRONULAC) 10 GM/15ML solution Take 30 mLs (20 g total) by mouth 3 (three) times daily. 11/08/22   Johnson, Clanford L, MD  mirtazapine (REMERON) 7.5 MG tablet Take 7.5 mg by mouth at bedtime.    [provider]  ondansetron (ZOFRAN) 4 MG tablet Take 1 tablet (4 mg total) by mouth every 8 (eight) hours as needed for nausea or vomiting. 11/08/22   Johnson, Clanford L, MD  pantoprazole (PROTONIX) 40 MG tablet Take 1 tablet (40 mg total) by mouth 2 (two) times daily. 02/03/23 02/03/24  Sherryll Burger, Pratik D, DO  Potassium Chloride ER 20 MEQ TBCR Take 1 tablet by mouth 2  (two) times daily. 01/27/23   [provider]  rifaximin (XIFAXAN) 550 MG TABS tablet Take 1 tablet (550 mg total) by mouth 2 (two) times daily. 11/08/22   Cleora Fleet, MD     Allergies    Patient has no known allergies.   Review of Systems   Review of Systems Please see HPI for pertinent positives and negatives  Physical Exam BP 129/89   Pulse 100   Temp 98.1 F (36.7 C) (Oral)   Resp 17   Ht  (1.626 m)   Wt 63.5 kg   LMP 02/05/2023   SpO2 98%   BMI 24.03 kg/m   Physical Exam Vitals and nursing note reviewed. Exam conducted with a chaperone present.  Constitutional:      Appearance: Normal appearance.  HENT:     Head: Normocephalic and atraumatic.     Nose: Nose normal.     Mouth/Throat:     Mouth: Mucous membranes are moist.  Eyes:     Extraocular Movements: Extraocular movements intact.     Conjunctiva/sclera: Conjunctivae normal.  Cardiovascular:     Rate and Rhythm: Normal rate.  Pulmonary:     Effort: Pulmonary effort is normal.     Breath sounds: Normal breath sounds.  Abdominal:     General: Bowel sounds are normal.     Tenderness: There is abdominal tenderness (diffuse). There is no guarding.  Genitourinary:    Rectum: Guaiac result positive.     Comments: Minimal stool in rectum, no mass Musculoskeletal:        General: No swelling. Normal range of motion.     Cervical back: Neck supple.  Skin:    General: Skin is warm and dry.  Neurological:     General: No focal deficit present.     Mental Status: She is alert.  Psychiatric:        Mood and Affect: Mood normal.     ED Results / Procedures / Treatments   EKG None  Procedures Procedures  Medications Ordered in the ED Medications  ondansetron (ZOFRAN) injection 4 mg (4 mg Intravenous Given 03/09/23 0054)  sodium chloride 0.9 % bolus 1,000 mL (1,000 mLs Intravenous New Bag/Given 03/09/23 0054)    Initial Impression and Plan  Patient with known cirrhosis here with  diarrhea, reportedly was black, has diffuse abdominal tenderness but no peritoneal signs. Labs done in triage show CBC with anemia, improved from recent admission where she had a transfusion. CMP is unremarkable. Lipase is mildly elevated. Hemoccult is positive. Given her history of cirrhosis, reported black stool and hemoccult positive. Plan admission for further evaluation and consideration of endoscopy.   ED Course   Clinical Course as of 03/09/23 0124  Tue Mar 09, 2023  1610 Patient with difficult peripheral access. I was able to place a 20ga IV in her L EJ. No [place] used. Good flush and blood return.  [CS]  9604 Spoke with Dr. Thomes Dinning, Hospitalist, who will evaluate for admission.  [CS]    Clinical Course User Index [CS] Pollyann Savoy, MD     MDM Rules/Calculators/A&P Medical Decision Making Problems Addressed: Diarrhea, unspecified type: chronic illness or injury with exacerbation, progression, or side effects of treatment Gastrointestinal hemorrhage, unspecified gastrointestinal hemorrhage type: acute illness or injury  Amount and/or Complexity of Data Reviewed Labs: ordered. Decision-making details documented in ED Course.  Risk Prescription drug management. Decision regarding hospitalization.     Final Clinical Impression(s) / ED Diagnoses Final diagnoses:  Gastrointestinal hemorrhage, unspecified gastrointestinal hemorrhage type  Diarrhea, unspecified type    Rx / DC Orders ED Discharge Orders     None        Pollyann Savoy, MD 03/09/23 [phone]

## 2023-03-09 ENCOUNTER — Encounter (HOSPITAL_COMMUNITY): Payer: Self-pay | Admitting: Internal Medicine

## 2023-03-09 DIAGNOSIS — K3189 Other diseases of stomach and duodenum: Secondary | ICD-10-CM | POA: Diagnosis not present

## 2023-03-09 DIAGNOSIS — K921 Melena: Secondary | ICD-10-CM | POA: Diagnosis not present

## 2023-03-09 DIAGNOSIS — K7290 Hepatic failure, unspecified without coma: Secondary | ICD-10-CM | POA: Diagnosis not present

## 2023-03-09 DIAGNOSIS — E46 Unspecified protein-calorie malnutrition: Secondary | ICD-10-CM

## 2023-03-09 DIAGNOSIS — R1084 Generalized abdominal pain: Secondary | ICD-10-CM | POA: Diagnosis not present

## 2023-03-09 DIAGNOSIS — R1030 Lower abdominal pain, unspecified: Secondary | ICD-10-CM

## 2023-03-09 DIAGNOSIS — K7031 Alcoholic cirrhosis of liver with ascites: Secondary | ICD-10-CM | POA: Diagnosis present

## 2023-03-09 DIAGNOSIS — K7030 Alcoholic cirrhosis of liver without ascites: Secondary | ICD-10-CM | POA: Diagnosis not present

## 2023-03-09 DIAGNOSIS — R112 Nausea with vomiting, unspecified: Secondary | ICD-10-CM | POA: Insufficient documentation

## 2023-03-09 DIAGNOSIS — E8809 Other disorders of plasma-protein metabolism, not elsewhere classified: Secondary | ICD-10-CM | POA: Diagnosis not present

## 2023-03-09 DIAGNOSIS — K766 Portal hypertension: Secondary | ICD-10-CM | POA: Diagnosis not present

## 2023-03-09 DIAGNOSIS — K922 Gastrointestinal hemorrhage, unspecified: Secondary | ICD-10-CM | POA: Diagnosis not present

## 2023-03-09 DIAGNOSIS — R197 Diarrhea, unspecified: Secondary | ICD-10-CM

## 2023-03-09 DIAGNOSIS — R748 Abnormal levels of other serum enzymes: Secondary | ICD-10-CM

## 2023-03-09 DIAGNOSIS — R000 Tachycardia, unspecified: Secondary | ICD-10-CM | POA: Diagnosis not present

## 2023-03-09 DIAGNOSIS — R21 Rash and other nonspecific skin eruption: Secondary | ICD-10-CM | POA: Diagnosis not present

## 2023-03-09 DIAGNOSIS — K7460 Unspecified cirrhosis of liver: Secondary | ICD-10-CM | POA: Diagnosis not present

## 2023-03-09 DIAGNOSIS — Z79899 Other long term (current) drug therapy: Secondary | ICD-10-CM | POA: Diagnosis not present

## 2023-03-09 DIAGNOSIS — G4700 Insomnia, unspecified: Secondary | ICD-10-CM | POA: Diagnosis not present

## 2023-03-09 DIAGNOSIS — E440 Moderate protein-calorie malnutrition: Secondary | ICD-10-CM | POA: Diagnosis not present

## 2023-03-09 DIAGNOSIS — E872 Acidosis, unspecified: Secondary | ICD-10-CM | POA: Diagnosis not present

## 2023-03-09 DIAGNOSIS — Z8249 Family history of ischemic heart disease and other diseases of the circulatory system: Secondary | ICD-10-CM | POA: Diagnosis not present

## 2023-03-09 DIAGNOSIS — E876 Hypokalemia: Secondary | ICD-10-CM | POA: Diagnosis not present

## 2023-03-09 DIAGNOSIS — G8929 Other chronic pain: Secondary | ICD-10-CM | POA: Diagnosis not present

## 2023-03-09 DIAGNOSIS — D649 Anemia, unspecified: Secondary | ICD-10-CM | POA: Diagnosis not present

## 2023-03-09 DIAGNOSIS — K219 Gastro-esophageal reflux disease without esophagitis: Secondary | ICD-10-CM | POA: Diagnosis not present

## 2023-03-09 DIAGNOSIS — K2961 Other gastritis with bleeding: Secondary | ICD-10-CM | POA: Diagnosis not present

## 2023-03-09 DIAGNOSIS — F419 Anxiety disorder, unspecified: Secondary | ICD-10-CM | POA: Diagnosis not present

## 2023-03-09 DIAGNOSIS — N939 Abnormal uterine and vaginal bleeding, unspecified: Secondary | ICD-10-CM | POA: Diagnosis not present

## 2023-03-09 DIAGNOSIS — L299 Pruritus, unspecified: Secondary | ICD-10-CM | POA: Diagnosis not present

## 2023-03-09 DIAGNOSIS — F17210 Nicotine dependence, cigarettes, uncomplicated: Secondary | ICD-10-CM | POA: Diagnosis not present

## 2023-03-09 DIAGNOSIS — F32A Depression, unspecified: Secondary | ICD-10-CM | POA: Diagnosis not present

## 2023-03-09 DIAGNOSIS — F418 Other specified anxiety disorders: Secondary | ICD-10-CM | POA: Diagnosis not present

## 2023-03-09 HISTORY — DX: Nausea with vomiting, unspecified: R11.2

## 2023-03-09 LAB — COMPREHENSIVE METABOLIC PANEL
ALT: 10 U/L (ref 0–44)
AST: 20 U/L (ref 15–41)
Albumin: 2.7 g/dL — ABNORMAL LOW (ref 3.5–5.0)
Alkaline Phosphatase: 57 U/L (ref 38–126)
Anion gap: 8 (ref 5–15)
BUN: 17 mg/dL (ref 6–20)
CO2: 16 mmol/L — ABNORMAL LOW (ref 22–32)
Calcium: 8 mg/dL — ABNORMAL LOW (ref 8.9–10.3)
Chloride: 109 mmol/L (ref 98–111)
Creatinine, Ser: 0.84 mg/dL (ref 0.44–1.00)
GFR, Estimated: >60 mL/min (ref >60)
Glucose, Bld: 126 mg/dL — ABNORMAL HIGH (ref 70–99)
Potassium: 3.1 mmol/L — ABNORMAL LOW (ref 3.5–5.1)
Sodium: 133 mmol/L — ABNORMAL LOW (ref 135–145)
Total Bilirubin: 0.8 mg/dL (ref 0.3–1.2)
Total Protein: 6.4 g/dL — ABNORMAL LOW (ref 6.5–8.1)

## 2023-03-09 LAB — TYPE AND SCREEN
ABO/RH(D): AB POS
Antibody Screen: NEGATIVE

## 2023-03-09 LAB — PHOSPHORUS: Phosphorus: 2.8 mg/dL (ref 2.5–4.6)

## 2023-03-09 LAB — CBC
HCT: 27.1 % — ABNORMAL LOW (ref 36.0–46.0)
Hemoglobin: 8.8 g/dL — ABNORMAL LOW (ref 12.0–15.0)
MCH: 31.4 pg (ref 26.0–34.0)
MCHC: 32.5 g/dL (ref 30.0–36.0)
MCV: 96.8 fL (ref 80.0–100.0)
Platelets: 206 10*3/uL (ref 150–400)
RBC: 2.8 MIL/uL — ABNORMAL LOW (ref 3.87–5.11)
RDW: 15.8 % — ABNORMAL HIGH (ref 11.5–15.5)
WBC: 8 10*3/uL (ref 4.0–10.5)
nRBC: 0 % (ref 0.0–0.2)

## 2023-03-09 LAB — PROTIME-INR
INR: 1.4 — ABNORMAL HIGH (ref 0.8–1.2)
Prothrombin Time: 17 seconds — ABNORMAL HIGH (ref 11.4–15.2)

## 2023-03-09 LAB — MAGNESIUM: Magnesium: 1.7 mg/dL (ref 1.7–2.4)

## 2023-03-09 MED ORDER — PANTOPRAZOLE SODIUM 40 MG IV SOLR
40.0000 mg | Freq: Two times a day (BID) | INTRAVENOUS | Status: DC
  Administered 2023-03-09 – 2023-03-12 (×7): 40 mg via INTRAVENOUS
  Filled 2023-03-09 (×7): qty 10

## 2023-03-09 MED ORDER — URSODIOL 300 MG PO CAPS
300.0000 mg | ORAL_CAPSULE | Freq: Three times a day (TID) | ORAL | Status: DC
  Administered 2023-03-09 – 2023-03-12 (×8): 300 mg via ORAL
  Filled 2023-03-09 (×18): qty 1

## 2023-03-09 MED ORDER — MELATONIN 3 MG PO TABS
6.0000 mg | ORAL_TABLET | Freq: Once | ORAL | Status: AC
  Administered 2023-03-09: 6 mg via ORAL
  Filled 2023-03-09: qty 2

## 2023-03-09 MED ORDER — DICYCLOMINE HCL 10 MG PO CAPS: 10.0000 mg | ORAL_CAPSULE | Freq: Two times a day (BID) | ORAL | Status: DC | PRN

## 2023-03-09 MED ORDER — SODIUM CHLORIDE 0.9 % IV SOLN
1.0000 g | INTRAVENOUS | Status: DC
  Administered 2023-03-09 – 2023-03-11 (×2): 1 g via INTRAVENOUS
  Filled 2023-03-09 (×2): qty 10

## 2023-03-09 MED ORDER — OXYCODONE HCL 5 MG PO TABS
5.0000 mg | ORAL_TABLET | Freq: Four times a day (QID) | ORAL | Status: DC | PRN
  Administered 2023-03-09 – 2023-03-12 (×10): 5 mg via ORAL
  Filled 2023-03-09 (×10): qty 1

## 2023-03-09 MED ORDER — ENSURE ENLIVE PO LIQD
237.0000 mL | Freq: Two times a day (BID) | ORAL | Status: DC
  Administered 2023-03-09 – 2023-03-12 (×5): 237 mL via ORAL

## 2023-03-09 MED ORDER — NICOTINE 14 MG/24HR TD PT24
14.0000 mg | MEDICATED_PATCH | Freq: Every day | TRANSDERMAL | Status: DC
  Administered 2023-03-09 – 2023-03-12 (×4): 14 mg via TRANSDERMAL
  Filled 2023-03-09 (×4): qty 1

## 2023-03-09 MED ORDER — RIFAXIMIN 550 MG PO TABS: 550.0000 mg | ORAL_TABLET | Freq: Two times a day (BID) | ORAL | Status: DC

## 2023-03-09 MED ORDER — SODIUM CHLORIDE 0.9 % IV SOLN: INTRAVENOUS | Status: AC

## 2023-03-09 MED ORDER — SPIRONOLACTONE 100 MG PO TABS
200.0000 mg | ORAL_TABLET | Freq: Every day | ORAL | Status: DC
  Administered 2023-03-10 – 2023-03-12 (×3): 200 mg via ORAL
  Filled 2023-03-09 (×3): qty 2

## 2023-03-09 MED ORDER — FUROSEMIDE 40 MG PO TABS
40.0000 mg | ORAL_TABLET | Freq: Two times a day (BID) | ORAL | Status: DC
  Administered 2023-03-10 – 2023-03-12 (×4): 40 mg via ORAL
  Filled 2023-03-09 (×5): qty 1

## 2023-03-09 MED ORDER — ONDANSETRON HCL 4 MG PO TABS: 4.0000 mg | ORAL_TABLET | Freq: Four times a day (QID) | ORAL | Status: DC | PRN

## 2023-03-09 MED ORDER — MORPHINE SULFATE (PF) 2 MG/ML IV SOLN
2.0000 mg | INTRAVENOUS | Status: DC | PRN
  Administered 2023-03-11: 2 mg via INTRAVENOUS
  Filled 2023-03-09: qty 1

## 2023-03-09 MED ORDER — DIPHENHYDRAMINE HCL 25 MG PO CAPS
25.0000 mg | ORAL_CAPSULE | Freq: Once | ORAL | Status: AC
  Administered 2023-03-09: 25 mg via ORAL
  Filled 2023-03-09: qty 1

## 2023-03-09 MED ORDER — LACTULOSE 10 GM/15ML PO SOLN
20.0000 g | Freq: Three times a day (TID) | ORAL | Status: DC
  Administered 2023-03-09 – 2023-03-12 (×8): 20 g via ORAL
  Filled 2023-03-09 (×8): qty 30

## 2023-03-09 MED ORDER — HYDROXYZINE HCL 10 MG PO TABS
10.0000 mg | ORAL_TABLET | Freq: Once | ORAL | Status: DC | PRN
  Filled 2023-03-09: qty 1

## 2023-03-09 MED ORDER — ONDANSETRON HCL 4 MG/2ML IJ SOLN
4.0000 mg | Freq: Four times a day (QID) | INTRAMUSCULAR | Status: DC | PRN
  Administered 2023-03-10 – 2023-03-11 (×2): 4 mg via INTRAVENOUS
  Filled 2023-03-09 (×2): qty 2

## 2023-03-09 MED ORDER — RIFAXIMIN 550 MG PO TABS
550.0000 mg | ORAL_TABLET | Freq: Two times a day (BID) | ORAL | Status: DC
  Administered 2023-03-09 – 2023-03-12 (×7): 550 mg via ORAL
  Filled 2023-03-09 (×7): qty 1
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
## 2023-03-09 NOTE — TOC Progression Note (Signed)
Transition of Care [facility]) - Progression Note    Patient Details  Name: Mallory Mendoza MRN: [id number] Date of Birth: 02-Apr-1981  Transition of Care [facility]) CM/SW Contact  Karn Cassis, [place] Phone Number: 03/09/2023, 10:06 AM  Clinical Narrative:  Transition of Care [facility]) Screening Note   Patient Details  Name: Mallory Mendoza Date of Birth: 18-Apr-1981   Transition of Care [facility]) CM/SW Contact:    Karn Cassis, LCSW Phone Number: 03/09/2023, 10:06 AM    Transition of Care Department [facility]) has reviewed patient and no TOC needs have been identified at this time. We will continue to monitor patient advancement through interdisciplinary progression rounds. If new patient transition needs arise, please place a TOC consult.          Barriers to Discharge: Continued Medical Work up  Expected Discharge Plan and Services                                               Social Determinants of Health (SDOH) Interventions SDOH Screenings   Food Insecurity: No Food Insecurity (01/30/2023)  Housing: Low Risk  (01/30/2023)  Transportation Needs: Unmet Transportation Needs (02/05/2023)  Utilities: Not At Risk (01/30/2023)  Alcohol Screen: Medium Risk (03/11/2021)  Depression (PHQ2-9): Medium Risk (03/11/2021)  Financial Resource Strain: Medium Risk (03/11/2021)  Physical Activity: Insufficiently Active (03/11/2021)  Social Connections: Moderately Integrated (03/11/2021)  Stress: Stress Concern Present (03/11/2021)  Tobacco Use: High Risk (03/08/2023)    Readmission Risk Interventions    02/01/2023    9:23 AM 12/21/2022   10:29 AM  Readmission Risk Prevention Plan  Transportation Screening Complete Complete  HRI or Home Care Consult Complete Complete  Social Work Consult for Recovery Care Planning/Counseling Complete Complete  Palliative Care Screening Not Applicable Not Applicable  Medication Review Oceanographer) Complete Complete

## 2023-03-09 NOTE — Progress Notes (Signed)
Patient has had c/o diarrhea since Friday after har paracentesis.  Patient states this am she looked at her stool and it became a very dark brownish red color.
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
## 2023-03-09 NOTE — Telephone Encounter (Signed)
Pt has been admitted to the hospital. I spoke to pt. Last Wednesday and she had a para done last Friday. I'm not sure how many times she called but I only spoke to her once.

## 2023-03-09 NOTE — ED Notes (Signed)
Unable to get iv after first and second person attempts

## 2023-03-09 NOTE — Telephone Encounter (Signed)
Noted  
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
## 2023-03-09 NOTE — ED Notes (Signed)
Ambulatory to restroom

## 2023-03-09 NOTE — Progress Notes (Signed)
Pt requesting medication for itching and for sleep.Overnight coverage , Dr Thomes Dinning made aware. Wardell Heath Gerrianne Scale

## 2023-03-09 NOTE — Progress Notes (Signed)
Patient admitted after midnight, please see H&P.  Here with suspected GI bleed.  GI bleed H/H= 10.4/32.2, this was 7.7/23.1 on 02/03/2023 Hemoccult was positive Continue IV Protonix 40 mg twice daily Gastroenterologist  consulted   Abdominal pain, nausea and vomiting Continue IV NS at 75 mLs/Hr Continue IV morphine 2 mg q.4h p.r.n. for moderate to severe pain Continue IV Zofran p.r.n. Continue NPO pending gastroenterology evaluation. -recent paracentesis by IR   Acute diarrhea Patient has not had bowel movement since arrival to the ED Consider C. difficile and GI stool panel if patient continues to have diarrhea   Elevated lipase level Lipase 61, continue abdominal pain management and IV hydration as described above for abdominal pain   Hypoalbuminemia Albumin 3.0, this is possibly secondary to patient's history of hepatic cirrhosis and moderate protein calorie malnutrition. Protein supplement will be provided   Alcoholic cirrhosis of liver Continue lactulose, rifaximin   GERD Continue Protonix  Marlin Canary DO

## 2023-03-09 NOTE — Telephone Encounter (Signed)
Our team is following inpatient.

## 2023-03-10 ENCOUNTER — Encounter (HOSPITAL_COMMUNITY): Payer: Self-pay | Admitting: Internal Medicine

## 2023-03-10 ENCOUNTER — Inpatient Hospital Stay (HOSPITAL_COMMUNITY): Payer: Medicaid Other | Admitting: Anesthesiology

## 2023-03-10 ENCOUNTER — Encounter (HOSPITAL_COMMUNITY)
Admission: EM | Disposition: A | Discharge status: Home / Self Care | Payer: Self-pay | Source: Home / Self Care | Attending: Family Medicine

## 2023-03-10 ENCOUNTER — Inpatient Hospital Stay: Payer: Medicaid Other | Admitting: Gastroenterology

## 2023-03-10 DIAGNOSIS — K7460 Unspecified cirrhosis of liver: Secondary | ICD-10-CM | POA: Diagnosis not present

## 2023-03-10 DIAGNOSIS — R1084 Generalized abdominal pain: Secondary | ICD-10-CM | POA: Diagnosis not present

## 2023-03-10 DIAGNOSIS — K921 Melena: Secondary | ICD-10-CM

## 2023-03-10 DIAGNOSIS — K3189 Other diseases of stomach and duodenum: Secondary | ICD-10-CM | POA: Diagnosis not present

## 2023-03-10 DIAGNOSIS — K7031 Alcoholic cirrhosis of liver with ascites: Secondary | ICD-10-CM

## 2023-03-10 DIAGNOSIS — F418 Other specified anxiety disorders: Secondary | ICD-10-CM

## 2023-03-10 DIAGNOSIS — D649 Anemia, unspecified: Secondary | ICD-10-CM | POA: Diagnosis not present

## 2023-03-10 DIAGNOSIS — K219 Gastro-esophageal reflux disease without esophagitis: Secondary | ICD-10-CM | POA: Diagnosis not present

## 2023-03-10 HISTORY — PX: ESOPHAGOGASTRODUODENOSCOPY (EGD) WITH PROPOFOL: SHX5813

## 2023-03-10 LAB — CULTURE, BODY FLUID W GRAM STAIN -BOTTLE: Culture: NO GROWTH

## 2023-03-10 SURGERY — ESOPHAGOGASTRODUODENOSCOPY (EGD) WITH PROPOFOL
Anesthesia: General

## 2023-03-10 MED ORDER — DERMACERIN EX CREA
1.0000 | TOPICAL_CREAM | Freq: Two times a day (BID) | CUTANEOUS | Status: DC
  Administered 2023-03-10 – 2023-03-12 (×5): 1 via CUTANEOUS
  Filled 2023-03-10: qty 107

## 2023-03-10 MED ORDER — LACTATED RINGERS IV SOLN: INTRAVENOUS | Status: DC

## 2023-03-10 MED ORDER — TRAZODONE HCL 50 MG PO TABS: 50.0000 mg | ORAL_TABLET | Freq: Every evening | ORAL | Status: DC | PRN

## 2023-03-10 MED ORDER — PROPOFOL 1000 MG/100ML IV EMUL
INTRAVENOUS | Status: AC
  Filled 2023-03-10: qty 100

## 2023-03-10 MED ORDER — POTASSIUM CHLORIDE CRYS ER 20 MEQ PO TBCR
40.0000 meq | EXTENDED_RELEASE_TABLET | ORAL | Status: AC
  Administered 2023-03-10: 40 meq via ORAL
  Filled 2023-03-10: qty 2

## 2023-03-10 MED ORDER — SODIUM CHLORIDE 0.9 % IV SOLN: INTRAVENOUS | Status: DC

## 2023-03-10 MED ORDER — HYDROXYZINE HCL 25 MG PO TABS
25.0000 mg | ORAL_TABLET | Freq: Four times a day (QID) | ORAL | Status: DC | PRN
  Administered 2023-03-10 – 2023-03-12 (×6): 25 mg via ORAL
  Filled 2023-03-10 (×6): qty 1

## 2023-03-10 MED ORDER — TRIAMCINOLONE ACETONIDE 0.5 % EX CREA
TOPICAL_CREAM | Freq: Two times a day (BID) | CUTANEOUS | Status: DC
  Filled 2023-03-10: qty 15

## 2023-03-10 MED ORDER — STERILE WATER FOR IRRIGATION IR SOLN
Status: DC | PRN
  Administered 2023-03-10: 120 mL

## 2023-03-10 MED ORDER — PROPOFOL 500 MG/50ML IV EMUL
INTRAVENOUS | Status: DC | PRN
  Administered 2023-03-10: 200 ug/kg/min via INTRAVENOUS

## 2023-03-10 MED ORDER — PROPOFOL 10 MG/ML IV BOLUS
INTRAVENOUS | Status: DC | PRN
  Administered 2023-03-10: 100 mg via INTRAVENOUS

## 2023-03-10 MED ORDER — HYDROCERIN EX CREA: TOPICAL_CREAM | Freq: Two times a day (BID) | CUTANEOUS | Status: DC

## 2023-03-10 MED ORDER — TRAZODONE HCL 50 MG PO TABS
50.0000 mg | ORAL_TABLET | Freq: Once | ORAL | Status: AC
  Administered 2023-03-11: 50 mg via ORAL
  Filled 2023-03-10: qty 1

## 2023-03-10 NOTE — Op Note (Signed)
[facility] Patient Name: Mallory Mendoza Procedure Date: 03/10/2023 2:35 PM MRN: [id number] Date of Birth: 1981-03-13 Attending MD: [name] , MD, [id number] CSN: [id number] Age: 42 Admit Type: Inpatient Procedure:                Upper GI endoscopy Indications:              Melena Providers:                [name], MD, Sheran Fava, Lennice Sites Technician, Technician Referring MD:              Medicines:                Propofol per Anesthesia Complications:            No immediate complications. Estimated Blood Loss:     Estimated blood loss was minimal. Procedure:                Pre-Anesthesia Assessment:                           - Prior to the procedure, a History and Physical                            was performed, and patient medications and                            allergies were reviewed. The patient's tolerance of                            previous anesthesia was also reviewed. The risks                            and benefits of the procedure and the sedation                            options and risks were discussed with the patient.                            All questions were answered, and informed consent                            was obtained. Prior Anticoagulants: The patient has                            taken no anticoagulant or antiplatelet agents. ASA                            Grade Assessment: III - A patient with severe                            systemic disease. After reviewing the risks and  benefits, the patient was deemed in satisfactory                            condition to undergo the procedure.                           After obtaining informed consent, the endoscope was                            passed under direct vision. Throughout the                            procedure, the patient's blood pressure, pulse, and                            oxygen  saturations were monitored continuously. The                            [id number]-[id number] ([id number]) scope was introduced through the                            mouth, and advanced to the second part of duodenum.                            The upper GI endoscopy was accomplished without                            difficulty. The patient tolerated the procedure                            well. Scope In: 2:53:59 PM Scope Out: 2:57:59 PM Total Procedure Duration: 0 hours 4 minutes 0 seconds  Findings:      The examined esophagus was normal. Very minimal retained gastric       residue. Easily lavaged out. Excellent visualization of the gastric       mucosa. Patient had diffuse severe changes of portal gastropathy with       touch friability/oozing of the gastric mucosa. No gastric varices, ulcer       or infiltrating process seen.      Patent pylorus.Pylorus.      The duodenal bulb and second portion of the duodenum were normal. Impression:               - Normal esophagus. No varices. Severe portal                            gastropathy with touch friability/oozing                           - Normal duodenal bulb and second portion of the                            duodenum.                           - No specimens collected.                           -  I suspect melena is from a bleeding stomach. She                            needs to have her portal pressures decreased. She                            has reverted to brown stool overnight and is hungry Moderate Sedation:      Moderate (conscious) sedation was personally administered by an       anesthesia professional. The following parameters were monitored: oxygen       saturation, heart rate, blood pressure, respiratory rate, EKG, adequacy       of pulmonary ventilation, and response to care. Recommendation:           - Return patient to hospital ward for ongoing care.                           - 2 g sodium diet. Trend hemoglobin. type of                             scenario.Consider adding a nonspecific beta-blocker                            to her regimen which has proven efficacy at                            decreasing portal pressures in this type of                            scenario.                           - Agree with consideration outpatient referral for                            IR TIPS consultation as previously recommended.                           - I was unable to reach family members (mother) at                            21 Procedure Code(s):        --- Professional ---                           [phone], Esophagogastroduodenoscopy, flexible,                            transoral; diagnostic, including collection of                            specimen(s) by brushing or washing, when performed                            (separate procedure) Diagnosis Code(s):        --- Professional ---  K92.1, Melena (includes Hematochezia) CPT copyright 2022 American Medical Association. All rights reserved. The codes documented in this report are preliminary and upon coder review may  be revised to meet current compliance requirements. [name]. [name], MD [name], MD 03/10/2023 3:35:05 PM This report has been signed electronically. Number of Addenda: 0

## 2023-03-10 NOTE — Plan of Care (Signed)

## 2023-03-10 NOTE — Hospital Course (Signed)
Mallory Mendoza is a 42 y.o. female with a history of alcoholic cirrhosis, anxiety, GERD. Patient presented secondary to diarrhea and abdominal pain but was also found to have evidence of melena. Protonix IV started. GI consulted with plan for upper endoscopy.
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
## 2023-03-10 NOTE — Progress Notes (Signed)
Pt is aware of NPO status for EGD today. Benadryl and Melatonin were ordered for itching and sleep. Pt reports the itching still persists. She declined atarax as it does not relieve symptoms. NO acute events overnight. Wardell Heath Gerrianne Scale

## 2023-03-10 NOTE — Transfer of Care (Signed)
mediate Anesthesia Transfer of Care Note  Patient: Mallory Mendoza  Procedure(s) Performed: ESOPHAGOGASTRODUODENOSCOPY (EGD) WITH PROPOFOL  Patient Location: PACU  Anesthesia Type:General  Level of Consciousness: drowsy, patient cooperative, and responds to stimulation  Airway & Oxygen Therapy: Patient Spontanous Breathing  Post-op Assessment: Report given to RN, Post -op Vital signs reviewed and stable, Patient moving all extremities X 4, and Patient able to stick tongue midline  Post vital signs: Reviewed  Last Vitals:  Vitals Value Taken Time  BP 96/58   Temp 97.5   Pulse 100   Resp 14   SpO2 100     Last Pain:  Vitals:   03/10/23 1451  TempSrc:   PainSc: 0-No pain         Complications: No notable events documented.

## 2023-03-10 NOTE — Anesthesia Postprocedure Evaluation (Signed)
Anesthesia Post Note  Patient: Mallory Mendoza  Procedure(s) Performed: ESOPHAGOGASTRODUODENOSCOPY (EGD) WITH PROPOFOL  Patient location during evaluation: Phase II Anesthesia Type: General Level of consciousness: awake and alert and oriented Pain management: pain level controlled Vital Signs Assessment: post-procedure vital signs reviewed and stable Respiratory status: spontaneous breathing, nonlabored ventilation and respiratory function stable Cardiovascular status: blood pressure returned to baseline and stable Postop Assessment: no apparent nausea or vomiting Anesthetic complications: no  No notable events documented.   Last Vitals:  Vitals:   03/10/23 1508 03/10/23 1515  BP:  110/85  Pulse: (!) 104 (!) 101  Resp: 16 16  Temp:  36.7 C  SpO2: 100% 100%    Last Pain:  Vitals:   03/10/23 1515  TempSrc:   PainSc: 4                  Jaime Dome C Mickal Meno

## 2023-03-10 NOTE — Anesthesia Preprocedure Evaluation (Signed)
Anesthesia Evaluation  Patient identified by MRN, date of birth, ID band Patient awake    Reviewed: Allergy & Precautions, H&P , NPO status , Patient's Chart, lab work & pertinent test results, reviewed documented beta blocker date and time   Airway Mallampati: II  TM Distance: >3 FB Neck ROM: Full   Comment: Cervical fx Dental  (+) Dental Advisory Given, Missing   Pulmonary pneumonia, Current Smoker and Patient abstained from smoking.   Pulmonary exam normal breath sounds clear to auscultation       Cardiovascular negative cardio ROS Normal cardiovascular exam Rhythm:Regular Rate:Normal     Neuro/Psych  PSYCHIATRIC DISORDERS Anxiety Depression    negative neurological ROS     GI/Hepatic ,GERD  Medicated,,(+) Cirrhosis   ascites  substance abuse  alcohol use  Endo/Other  negative endocrine ROS    Renal/GU Renal disease  negative genitourinary   Musculoskeletal negative musculoskeletal ROS (+)    Abdominal   Peds negative pediatric ROS (+)  Hematology  (+) Blood dyscrasia, anemia   Anesthesia Other Findings   Reproductive/Obstetrics negative OB ROS                             Anesthesia Physical Anesthesia Plan  ASA: 3  Anesthesia Plan: General   Post-op Pain Management: Minimal or no pain anticipated   Induction: Intravenous  PONV Risk Score and Plan: 1 and Propofol infusion  Airway Management Planned: Nasal Cannula and Natural Airway  Additional Equipment:   Intra-op Plan:   Post-operative Plan: Possible Post-op intubation/ventilation  Informed Consent: I have reviewed the patients History and Physical, chart, labs and discussed the procedure including the risks, benefits and alternatives for the proposed anesthesia with the patient or authorized representative who has indicated his/her understanding and acceptance.     Dental advisory given  Plan Discussed with:  CRNA and Surgeon  Anesthesia Plan Comments:         Anesthesia Quick Evaluation

## 2023-03-10 NOTE — Interval H&P Note (Signed)
History and Physical Interval Note:  03/10/2023 2:40 PM  Mallory Mendoza  has presented today for surgery, with the diagnosis of melena, acute on chronic anemia.  The various methods of treatment have been discussed with the patient and family. After consideration of risks, benefits and other options for treatment, the patient has consented to  Procedure(s): ESOPHAGOGASTRODUODENOSCOPY (EGD) WITH PROPOFOL (N/A) as a surgical intervention.  The patient's history has been reviewed, patient examined, no change in status, stable for surgery.  I have reviewed the patient's chart and labs.  Questions were answered to the patient's satisfaction.     Mallory Mendoza    Hemoglobin 8.8 this morning.  Remains hemodynamically stable.  Denies dysphagia.  Agree with need for EGD to complete survey of her upper GI tract for melena.  The risks, benefits, limitations, alternatives and imponderables have been reviewed with the patient. Potential for esophageal dilation, biopsy, etc. have also been reviewed.  Questions have been answered. All parties agreeable.

## 2023-03-10 NOTE — Progress Notes (Signed)
PROGRESS NOTE    Mallory Mendoza  ZOX:[id number] DOB: [birth year] DOA: 03/08/2023 PCP: Benetta Spar, MD   Brief Narrative: Mallory Mendoza is a 42 y.o. female with a history of alcoholic cirrhosis, anxiety, GERD. Patient presented secondary to diarrhea and abdominal pain but was also found to have evidence of melena. Protonix IV started. GI consulted with plan for upper endoscopy.   Assessment and Plan:  Acute GI bleeding Melena noted in history. Patient with known gastritis but no prior evidence of varices. Complicated by NSAID use. Empiric Protonix IV started. GI consulted and plan for upper endoscopy today, 4/24. -Follow-up GI recommendations/management -Continue Protonix IV  Normocytic anemia Chronic and stable.  Abdominal pain Lower abdominal. Unclear etiology but possibly related to ascites. No encephalopathy or fevers. Abdominal pain previously evaluated by CT abdomen without identification of etiology. -Supportive care -Evaluate for repeat paracentesis on 4/25  Nausea/vomiting Unclear etiology. Possibly related to peptic ulcer disease. No emesis today. -Diet per GI once clear from EGD  Diarrhea Appears to have improved. Possibly related to GI bleeding. -Monitor  Hypokalemia Potassium of 3.1 on BMP. -Potassium supplementation  Nonanion gap metabolic acidosis Mild. -Repeat metabolic panel in AM  Left ankle rash Unclear etiology. Very itchy. -Triamcinolone cream BID  Elevated lipase Lipase of 61 on admission.  Alcoholic cirrhosis of the liver Associated ascites, requiring paracenteses. -Continue spironolactone, furosemide and rifaximin -GI recommendations: increase to spironolactone 200 mg daily and furosemide 40 mg BID  GERD -Continue Protonix   DVT prophylaxis: SCDs Code Status:   Code Status: Full Code Family Communication: Daughter at bedside Disposition Plan: Discharge home likely in 24 hours pending continue GI  recommendations/management, continued resolution of GI bleeding and possible paracentesis   Consultants:  Gastroenterology  Procedures:  None  Antimicrobials: None    Subjective: Patient reports itching over her whole body. Also reports insomnia, which is a chronic issue. Abdominal pain is still present but slightly improved.   Objective: BP 125/88   Pulse (!) 107   Temp 98.3 F (36.8 C)   Resp 18   Ht  (1.626 m)   Wt 57.7 kg   LMP 02/05/2023   SpO2 100%   BMI 21.83 kg/m   Examination:  General exam: Appears calm and comfortable Respiratory system: Clear to auscultation. Respiratory effort normal. Cardiovascular system: S1 & S2 heard, RRR. No murmurs, rubs, gallops or clicks. Gastrointestinal system: Abdomen is distended, soft and mildly tender. Normal bowel sounds heard. Central nervous system: Alert and oriented. No focal neurological deficits. Musculoskeletal: No edema. No calf tenderness Skin: No cyanosis. Dry skin diffusely. Left lateral lower leg with excoriation noted overlaying chronic appearing/hyperpigmented papular rash Psychiatry: Judgement and insight appear normal. Mood & affect appropriate.    Data Reviewed: I have personally reviewed following labs and imaging studies  CBC Lab Results  Component Value Date   WBC 8.0 03/09/2023   RBC 2.80 (L) 03/09/2023   HGB 8.8 (L) 03/09/2023   HCT 27.1 (L) 03/09/2023   MCV 96.8 03/09/2023   MCH 31.4 03/09/2023   PLT 206 03/09/2023   MCHC 32.5 03/09/2023   RDW 15.8 (H) 03/09/2023   LYMPHSABS 2.7 01/29/2023   MONOABS 0.9 01/29/2023   EOSABS 0.3 01/29/2023   BASOSABS 0.0 01/29/2023     Last metabolic panel Lab Results  Component Value Date   NA 133 (L) 03/09/2023   K 3.1 (L) 03/09/2023   CL 109 03/09/2023   CO2 16 (L) 03/09/2023   BUN 17  03/09/2023   CREATININE 0.84 03/09/2023   GLUCOSE 126 (H) 03/09/2023   GFRNONAA >60 03/09/2023   GFRAA 134 08/12/2020   CALCIUM 8.0 (L) 03/09/2023   PHOS  2.8 03/09/2023   PROT 6.4 (L) 03/09/2023   ALBUMIN 2.7 (L) 03/09/2023   LABGLOB 3.5 01/31/2023   AGRATIO 0.8 01/31/2023   BILITOT 0.8 03/09/2023   ALKPHOS 57 03/09/2023   AST 20 03/09/2023   ALT 10 03/09/2023   ANIONGAP 8 03/09/2023    GFR: Estimated Creatinine Clearance: 76.1 mL/min (by C-G formula based on SCr of 0.84 mg/dL).  Recent Results (from the past 240 hour(s))  Gram stain     Status: None   Collection Time: 03/05/23  8:55 AM   Specimen: Ascitic  Result Value Ref Range Status   Specimen Description ASCITIC  Final   Special Requests NONE  Final   Gram Stain   Final    CYTOSPIN SMEAR NO ORGANISMS SEEN WBC PRESENT,BOTH PMN AND MONONUCLEAR Performed at [facility], [address], [place], [place] [address]    Report Status 03/05/2023 FINAL  Final  Culture, body fluid w Gram Stain-bottle     Status: None (Preliminary result)   Collection Time: 03/05/23  8:55 AM   Specimen: Ascitic  Result Value Ref Range Status   Specimen Description ASCITIC  Final   Special Requests 10CC BAA  Final   Culture   Final    NO GROWTH 4 DAYS Performed at [facility], [address] [place], [place] [address]    Report Status PENDING  Incomplete      Radiology Studies: No results found.    LOS: 1 day    Jacquelin Hawking, MD [facility] 03/10/2023, 7:46 AM   If 7PM-7AM, please contact night-coverage [email or web address]

## 2023-03-11 ENCOUNTER — Encounter (HOSPITAL_COMMUNITY): Payer: Self-pay | Admitting: Internal Medicine

## 2023-03-11 ENCOUNTER — Inpatient Hospital Stay (HOSPITAL_COMMUNITY): Payer: Medicaid Other

## 2023-03-11 DIAGNOSIS — R188 Other ascites: Secondary | ICD-10-CM | POA: Diagnosis not present

## 2023-03-11 DIAGNOSIS — R1084 Generalized abdominal pain: Secondary | ICD-10-CM | POA: Diagnosis not present

## 2023-03-11 DIAGNOSIS — K7031 Alcoholic cirrhosis of liver with ascites: Secondary | ICD-10-CM | POA: Diagnosis not present

## 2023-03-11 DIAGNOSIS — K219 Gastro-esophageal reflux disease without esophagitis: Secondary | ICD-10-CM | POA: Diagnosis not present

## 2023-03-11 DIAGNOSIS — K921 Melena: Secondary | ICD-10-CM | POA: Diagnosis not present

## 2023-03-11 LAB — BASIC METABOLIC PANEL
Anion gap: 6 (ref 5–15)
BUN: 8 mg/dL (ref 6–20)
CO2: 18 mmol/L — ABNORMAL LOW (ref 22–32)
Calcium: 8.2 mg/dL — ABNORMAL LOW (ref 8.9–10.3)
Chloride: 111 mmol/L (ref 98–111)
Creatinine, Ser: 0.74 mg/dL (ref 0.44–1.00)
GFR, Estimated: >60 mL/min (ref >60)
Glucose, Bld: 98 mg/dL (ref 70–99)
Potassium: 3.2 mmol/L — ABNORMAL LOW (ref 3.5–5.1)
Sodium: 135 mmol/L (ref 135–145)

## 2023-03-11 LAB — GRAM STAIN

## 2023-03-11 LAB — BODY FLUID CELL COUNT WITH DIFFERENTIAL
Eos, Fluid: 0 %
Lymphs, Fluid: 57 %
Monocyte-Macrophage-Serous Fluid: 24 % — ABNORMAL LOW (ref 50–90)
Neutrophil Count, Fluid: 19 % (ref 0–25)
Total Nucleated Cell Count, Fluid: 199 cu mm (ref 0–1000)

## 2023-03-11 LAB — CBC
HCT: 23.7 % — ABNORMAL LOW (ref 36.0–46.0)
Hemoglobin: 7.7 g/dL — ABNORMAL LOW (ref 12.0–15.0)
MCH: 31.3 pg (ref 26.0–34.0)
MCHC: 32.5 g/dL (ref 30.0–36.0)
MCV: 96.3 fL (ref 80.0–100.0)
Platelets: 187 10*3/uL (ref 150–400)
RBC: 2.46 MIL/uL — ABNORMAL LOW (ref 3.87–5.11)
RDW: 15.6 % — ABNORMAL HIGH (ref 11.5–15.5)
WBC: 6.8 10*3/uL (ref 4.0–10.5)
nRBC: 0 % (ref 0.0–0.2)

## 2023-03-11 LAB — POTASSIUM: Potassium: 3.6 mmol/L (ref 3.5–5.1)

## 2023-03-11 MED ORDER — NADOLOL 20 MG PO TABS
20.0000 mg | ORAL_TABLET | Freq: Every day | ORAL | Status: DC
  Administered 2023-03-11 – 2023-03-12 (×2): 20 mg via ORAL
  Filled 2023-03-11 (×5): qty 1

## 2023-03-11 MED ORDER — POTASSIUM CHLORIDE CRYS ER 20 MEQ PO TBCR
40.0000 meq | EXTENDED_RELEASE_TABLET | Freq: Once | ORAL | Status: AC
  Administered 2023-03-11: 40 meq via ORAL
  Filled 2023-03-11: qty 2

## 2023-03-11 MED ORDER — TRAZODONE HCL 50 MG PO TABS
100.0000 mg | ORAL_TABLET | Freq: Every evening | ORAL | Status: DC | PRN
  Administered 2023-03-11: 100 mg via ORAL
  Filled 2023-03-11: qty 2

## 2023-03-11 MED ORDER — MELATONIN 3 MG PO TABS
6.0000 mg | ORAL_TABLET | Freq: Every day | ORAL | Status: DC
  Administered 2023-03-11: 6 mg via ORAL
  Filled 2023-03-11: qty 2

## 2023-03-11 NOTE — Procedures (Addendum)
PROCEDURE SUMMARY:  Successful US guided paracentesis from right lateral abdomen.  Yielded 5.0 liters of amber fluid.  No immediate complications.  Pt tolerated well.   Specimen was sent for labs.  EBL < 5mL  PHC Update: Per Dr. Elby Showers note 3/28: Formal consult for TIPS creation could be considered as above.  The patient's Gastroenterologist, Dr. Marletta Lor, will be contacted for further discussion.   Hoyt Koch PA-C 03/11/2023 12:27 PM

## 2023-03-11 NOTE — Progress Notes (Signed)
Gastroenterology Progress Note   Referring Provider: No ref. provider found Primary Care Physician:  Benetta Spar, MD Primary Gastroenterologist:  Hennie Duos. Marletta Lor, DO   Patient ID: Mallory Mendoza; [id number]; 10/01/1981   Subjective:    Had 5 liters drawn off today. Abdomen feels little better. Stools are brown. Tolerating diet.   Objective:   Vital signs in last 24 hours: Temp:  [97.5 F (36.4 C)-98.7 F (37.1 C)] 98.2 F (36.8 C) (04/25 1136) Pulse Rate:  [101-111] 103 (04/25 1220) Resp:  [16-18] 18 (04/25 1220) BP: (98-122)/(58-85) 108/77 (04/25 1220) SpO2:  [98 %-100 %] 100 % (04/25 1220) Last BM Date : 03/10/23 General:   Alert,  Well-developed, well-nourished, pleasant and cooperative in NAD Head:  Normocephalic and atraumatic. Eyes:  Sclera clear, no icterus.  Chest: CTA bilaterally without rales, rhonchi, crackles.    Heart:  Regular rate and rhythm; no murmurs, clicks, rubs,  or gallops. Abdomen:  Soft, nontender and nondistended. No masses, hepatosplenomegaly or hernias noted. Normal bowel sounds, without guarding, and without rebound.   Extremities:  Without clubbing, deformity or edema. Neurologic:  Alert and  oriented x4;  grossly normal neurologically. Skin:  Intact without significant lesions or rashes. Psych:  Alert and cooperative. Normal mood and affect.  Intake/Output from previous day: 04/24 0701 - 04/25 0700 In: 800 [P.O.:300; I.V.:500] Out: 0  Intake/Output this shift: Total I/O In: 240 [P.O.:240] Out: -   Lab Results: CBC Recent Labs    03/08/23 1737 03/09/23 1205 03/11/23 0511  WBC 9.3 8.0 6.8  HGB 10.4* 8.8* 7.7*  HCT 32.2* 27.1* 23.7*  MCV 96.7 96.8 96.3  PLT 230 206 187   BMET Recent Labs    03/08/23 1737 03/09/23 1205 03/11/23 0511  NA 136 133* 135  K 3.6 3.1* 3.2*  CL 109 109 111  CO2 17* 16* 18*  GLUCOSE 86 126* 98  BUN CREATININE 1.07* 0.84 0.74  CALCIUM 8.8* 8.0* 8.2*   LFTs Recent Labs     03/08/23 1737 03/09/23 1205  BILITOT 1.6* 0.8  ALKPHOS 63 57  AST 22 20  ALT 9 10  PROT 7.4 6.4*  ALBUMIN 3.0* 2.7*   Recent Labs    03/08/23 1737  LIPASE 61*   PT/INR Recent Labs    03/09/23 1205  LABPROT 17.0*  INR 1.4*         Imaging Studies: US Paracentesis  Result Date: 03/05/2023 INDICATION: Patient with a history of cirrhosis with recurrent ascites. Interventional Radiology asked to perform a diagnostic and therapeutic paracentesis with a 5 L max. EXAM: ULTRASOUND GUIDED PARACENTESIS MEDICATIONS: 1% lidocaine 10 ml COMPLICATIONS: None immediate. PROCEDURE: Informed written consent was obtained from the patient after a discussion of the risks, benefits and alternatives to treatment. A timeout was performed prior to the initiation of the procedure. Initial ultrasound scanning demonstrates a large amount of ascites within the right lower abdominal quadrant. The right lower abdomen was prepped and draped in the usual sterile fashion. 1% lidocaine was used for local anesthesia. Following this, a 19 gauge, 7-cm, Yueh catheter was introduced. An ultrasound image was saved for documentation purposes. The paracentesis was performed. The catheter was removed and a dressing was applied. The patient tolerated the procedure well without immediate post procedural complication. Patient received post-procedure intravenous albumin; see nursing notes for details. FINDINGS: A total of approximately 4.6 L of clear yellow fluid was removed. Samples were sent to the laboratory as requested by the  clinical team. IMPRESSION: Successful ultrasound-guided paracentesis yielding 4.6 liters of peritoneal fluid. Read by: Alwyn Ren, NP PLAN: The patient has required >/=2 paracenteses in a 30 day period and a formal evaluation by the [facility] Interventional Radiology Portal Hypertension [facility] has been arranged. ** Please see Dr. Jerrye Noble note dated 02/11/23 ** Electronically Signed   By: Mauri Reading  Mir  M.D.   On: 03/05/2023 11:21   US Paracentesis  Result Date: 02/11/2023 INDICATION: Cirrhosis; recurrent ascites EXAM: ULTRASOUND GUIDED RLQ PARACENTESIS MEDICATIONS: 10 cc 1% lidocaine. COMPLICATIONS: None immediate. PROCEDURE: Informed written consent was obtained from the patient after a discussion of the risks, benefits and alternatives to treatment. A timeout was performed prior to the initiation of the procedure. Initial ultrasound scanning demonstrates a large amount of ascites within the right lower abdominal quadrant. The right lower abdomen was prepped and draped in the usual sterile fashion. 1% lidocaine was used for local anesthesia. Following this, a 19 gauge, 7-cm, Yueh catheter was introduced. An ultrasound image was saved for documentation purposes. The paracentesis was performed. The catheter was removed and a dressing was applied. The patient tolerated the procedure well without immediate post procedural complication. Patient received post-procedure intravenous albumin; see nursing notes for details. FINDINGS: A total of approximately 4.3 liters of yellow fluid was removed. Samples were sent to the laboratory as requested by the clinical team. IMPRESSION: Successful ultrasound-guided paracentesis yielding 4.3 liters of peritoneal fluid. PLAN: The patient has required >/=2 paracenteses in a 30 day period and a formal evaluation by the [facility] Interventional Radiology Portal Hypertension Clinic has been arranged. Read by Robet Leu [facility] Electronically Signed   By: Ulyses Southward M.D.   On: 02/11/2023 10:33  [2 weeks]  Assessment:   42 year old female with history of anxiety, depression,decompensated ETOH cirrhosis, PBC, alcoholic hepatitis in Dec 2023, hepatic encephalopathy, previously hospitalized for anasarca and has required repeat  paracentesis for ascites (first in Feb. 2024). Admitted in March for possible SBP due to positive Gram stain, but culture with Staph epidermidis and felt  to be a contaminant, hemoglobin declined to 6.7 during that admission requiring 1 unit PRBCs.  She did undergo EGD with no evidence of varices, medium amount of food in the stomach precluding complete exam with recommendations for outpatient EGD and colonoscopy.  Patient return to emergency room on 4/22 reporting diarrhea and nausea since paracentesis on Friday, 4/19, then reported black stool while in the waiting room.   Acute onset nausea/vomiting/diarrhea: Symptoms resolved  Melena: Frequent ibuprofen use and cirrhosis.  Takes PPI twice daily as an outpatient but ran out few days prior to admission.  Admission hemoglobin of 10.4. EGD April 24: Severe portal gastropathy with touch friability/oozing.  Hemoglobin this morning down to 7.7, 2 days ago was 8.8.  Abdominal pain: Chronic abdominal pain involving the left upper and left lower quadrants.  No SBP on recent paracenteses.  Prior CT abdomen pelvis in February with no acute abnormalities.  Consider outpatient colonoscopy.  Irregular vaginal bleeding: Recommend GYN outpatient evaluation.  Decompensated cirrhosis: Secondary to EtOH and PBC.  Complicated by recurrent ascites, frequent paracenteses, history of hepatic encephalopathy, now on lactulose and rifaximin.  She reports compliance with furosemide 40 mg daily and spironolactone 100 mg daily.  Will consider IR TIPS evaluation to help reduce portal pressures for management of bleeding from portal hypertensive gastropathy. She has required about 2 paracenteses per month for the past 2 months and TIPS evaluation recommended by IR for management of ascites. She does  have history of HE and would be at increased risk in the future.   Patient had 5 L of ascitic fluid removed today.  Her cell count showed total nucleated cell count of 199 with 90% neutrophils. No evidence of SBP.   Plan:   Consider nonselective beta-blocker.  Patient was started on nadolol 20 mg daily today, titrate to heart rate of  60s.. Consider outpatient referral to IR for TIPS consultation, to discuss risks/benefits. 2 g sodium diet. Ursodiol 300 mg 3 times daily. Outpatient colonoscopy. Rifaximin 550 mg twice daily. Lactulose titrate to 3 BMs daily. PPI twice daily. Continue Lasix 40 mg twice daily, spironolactone 200 mg daily. Will need to monitor renal function closely.    LOS: 2 days   Leanna Battles. Dixon Boos [facility] [phone] 4/25/20242:01 PM

## 2023-03-11 NOTE — Progress Notes (Signed)
PROGRESS NOTE    Mallory Mendoza  ZOX:[id number] DOB: 05-08-1981 DOA: 03/08/2023 PCP: Benetta Spar, MD   Brief Narrative: Mallory Mendoza is a 42 y.o. female with a history of alcoholic cirrhosis, anxiety, GERD. Patient presented secondary to diarrhea and abdominal pain but was also found to have evidence of melena. Protonix IV started. GI consulted and performed an upper endoscopy procedure on 4/24, significant for severe portal gastropathy.   Assessment and Plan:  Acute GI bleeding Melena noted in history. Patient with known gastritis but no prior evidence of varices. Complicated by NSAID use. Empiric Protonix IV started. GI consulted and performed an upper endoscopy on 4/24, which was significant for evidence of portal gastropathy with gastric oozing/friability noted. -GI recommendations (4/24): 2 g sodium diet, non-specific beta-blocker, TIPS follow-up -Nadolol -Continue Protonix IV  Normocytic anemia Chronic and trending down. -Trend CBC  Abdominal pain Lower abdominal. Unclear etiology but possibly related to ascites. No encephalopathy or fevers. Abdominal pain previously evaluated by CT abdomen without identification of etiology. -Supportive care -US paracentesis ordered  Nausea/vomiting Unclear etiology. Possibly related to peptic ulcer disease. No emesis today. -Diet per GI once clear from EGD  Diarrhea Appears to have improved. Possibly related to GI bleeding. -Monitor  Hypokalemia -Potassium supplementation -Recheck potassium this afternoon  Hypokalemia Potassium of 3.1 on BMP. -Potassium supplementation  Nonanion gap metabolic acidosis Mild. -Repeat metabolic panel in AM  Left ankle rash Unclear etiology. Very itchy. -Triamcinolone cream BID  Pruritis Unclear etiology. Chronic issue. -Hydroxyzine PRN -Keep skin moisturized  Elevated lipase Lipase of 61 on admission.  Alcoholic cirrhosis of the liver Portal gastropathy Associated  ascites, requiring paracenteses. -Continue spironolactone, furosemide and rifaximin -GI recommendations: increase to spironolactone 200 mg daily and furosemide 40 mg BID -Start nadolol 20 mg daily for portal gastropathy  GERD -Continue Protonix   DVT prophylaxis: SCDs Code Status:   Code Status: Full Code Family Communication: None at bedside. Daughter on telephone Disposition Plan: Discharge home likely in 24 hours pending continue GI recommendations/management, continued resolution of GI bleeding, paracentesis and stability of hemoglobin   Consultants:  Gastroenterology  Procedures:  4/24: Upper endoscopy  Antimicrobials: None    Subjective: Patient reports itching over her whole body. Also reports insomnia, which is a chronic issue. Abdominal pain is still present but slightly improved.   Objective: BP 122/78 (BP Location: Right Arm)   Pulse (!) 109   Temp 98.5 F (36.9 C) (Oral)   Resp 16   Ht  (1.626 m)   Wt 57.7 kg   LMP 02/05/2023   SpO2 100%   BMI 21.83 kg/m   Examination:  General exam: Appears calm and comfortable Respiratory system: Clear to auscultation. Respiratory effort normal. Cardiovascular system: S1 & S2 heard, RRR. Gastrointestinal system: Abdomen is distended, soft and mildly tender. Normal bowel sounds heard. Central nervous system: Alert and oriented. No focal neurological deficits. Musculoskeletal: No calf tenderness Skin: No cyanosis. Dry Psychiatry: Judgement and insight appear normal. Mood & affect appropriate.    Data Reviewed: I have personally reviewed following labs and imaging studies  CBC Lab Results  Component Value Date   WBC 6.8 03/11/2023   RBC 2.46 (L) 03/11/2023   HGB 7.7 (L) 03/11/2023   HCT 23.7 (L) 03/11/2023   MCV 96.3 03/11/2023   MCH 31.3 03/11/2023   PLT 187 03/11/2023   MCHC 32.5 03/11/2023   RDW 15.6 (H) 03/11/2023   LYMPHSABS 2.7 01/29/2023   MONOABS 0.9 01/29/2023  EOSABS 0.3 01/29/2023    BASOSABS 0.0 01/29/2023     Last metabolic panel Lab Results  Component Value Date   NA 135 03/11/2023   K 3.2 (L) 03/11/2023   CL 111 03/11/2023   CO2 18 (L) 03/11/2023   BUN 8 03/11/2023   CREATININE 0.74 03/11/2023   GLUCOSE 98 03/11/2023   GFRNONAA >60 03/11/2023   GFRAA 134 08/12/2020   CALCIUM 8.2 (L) 03/11/2023   PHOS 2.8 03/09/2023   PROT 6.4 (L) 03/09/2023   ALBUMIN 2.7 (L) 03/09/2023   LABGLOB 3.5 01/31/2023   AGRATIO 0.8 01/31/2023   BILITOT 0.8 03/09/2023   ALKPHOS 57 03/09/2023   AST 20 03/09/2023   ALT 10 03/09/2023   ANIONGAP 6 03/11/2023    GFR: Estimated Creatinine Clearance: 79.9 mL/min (by C-G formula based on SCr of 0.74 mg/dL).  Recent Results (from the past 240 hour(s))  Gram stain     Status: None   Collection Time: 03/05/23  8:55 AM   Specimen: Ascitic  Result Value Ref Range Status   Specimen Description ASCITIC  Final   Special Requests NONE  Final   Gram Stain   Final    CYTOSPIN SMEAR NO ORGANISMS SEEN WBC PRESENT,BOTH PMN AND MONONUCLEAR Performed at [facility], [address] [place], [place] [address]    Report Status 03/05/2023 FINAL  Final  Culture, body fluid w Gram Stain-bottle     Status: None   Collection Time: 03/05/23  8:55 AM   Specimen: Ascitic  Result Value Ref Range Status   Specimen Description ASCITIC  Final   Special Requests 10CC BAA  Final   Culture   Final    NO GROWTH 5 DAYS Performed at [facility], [address], [place], [place] [address]    Report Status 03/10/2023 FINAL  Final      Radiology Studies: No results found.    LOS: 2 days    Jacquelin Hawking, MD [facility] 03/11/2023, 8:43 AM   If 7PM-7AM, please contact night-coverage [email or web address]

## 2023-03-11 NOTE — Progress Notes (Signed)
Patient tolerated right sided paracentesis procedure well today and 5 Liters of light amber colored ascites removed with labs collected and sent for processing. Patient verbalized understanding of post procedure instructions and returned to inpatient bed assignment via stretcher at this time with no acute distress noted.

## 2023-03-12 ENCOUNTER — Telehealth: Payer: Self-pay | Admitting: Gastroenterology

## 2023-03-12 ENCOUNTER — Encounter (HOSPITAL_COMMUNITY): Payer: Self-pay | Admitting: Internal Medicine

## 2023-03-12 DIAGNOSIS — K3189 Other diseases of stomach and duodenum: Secondary | ICD-10-CM | POA: Insufficient documentation

## 2023-03-12 DIAGNOSIS — K766 Portal hypertension: Secondary | ICD-10-CM | POA: Diagnosis not present

## 2023-03-12 DIAGNOSIS — R188 Other ascites: Secondary | ICD-10-CM

## 2023-03-12 DIAGNOSIS — K743 Primary biliary cirrhosis: Secondary | ICD-10-CM

## 2023-03-12 DIAGNOSIS — N939 Abnormal uterine and vaginal bleeding, unspecified: Secondary | ICD-10-CM

## 2023-03-12 DIAGNOSIS — K921 Melena: Secondary | ICD-10-CM | POA: Diagnosis not present

## 2023-03-12 LAB — CBC WITH DIFFERENTIAL/PLATELET
Abs Immature Granulocytes: 0.03 10*3/uL (ref 0.00–0.07)
Basophils Absolute: 0 10*3/uL (ref 0.0–0.1)
Basophils Relative: 1 %
Eosinophils Absolute: 0.2 10*3/uL (ref 0.0–0.5)
Eosinophils Relative: 3 %
HCT: 22.2 % — ABNORMAL LOW (ref 36.0–46.0)
Hemoglobin: 7.5 g/dL — ABNORMAL LOW (ref 12.0–15.0)
Immature Granulocytes: 1 %
Lymphocytes Relative: 33 %
Lymphs Abs: 2 10*3/uL (ref 0.7–4.0)
MCH: 31.8 pg (ref 26.0–34.0)
MCHC: 33.8 g/dL (ref 30.0–36.0)
MCV: 94.1 fL (ref 80.0–100.0)
Monocytes Absolute: 0.6 10*3/uL (ref 0.1–1.0)
Monocytes Relative: 10 %
Neutro Abs: 3.2 10*3/uL (ref 1.7–7.7)
Neutrophils Relative %: 52 %
Platelets: 166 10*3/uL (ref 150–400)
RBC: 2.36 MIL/uL — ABNORMAL LOW (ref 3.87–5.11)
RDW: 15.8 % — ABNORMAL HIGH (ref 11.5–15.5)
WBC: 6 10*3/uL (ref 4.0–10.5)
nRBC: 0 % (ref 0.0–0.2)

## 2023-03-12 LAB — COMPREHENSIVE METABOLIC PANEL
ALT: 9 U/L (ref 0–44)
AST: 19 U/L (ref 15–41)
Albumin: 2.1 g/dL — ABNORMAL LOW (ref 3.5–5.0)
Alkaline Phosphatase: 52 U/L (ref 38–126)
Anion gap: 6 (ref 5–15)
BUN: 8 mg/dL (ref 6–20)
CO2: 20 mmol/L — ABNORMAL LOW (ref 22–32)
Calcium: 8.5 mg/dL — ABNORMAL LOW (ref 8.9–10.3)
Chloride: 110 mmol/L (ref 98–111)
Creatinine, Ser: 0.69 mg/dL (ref 0.44–1.00)
GFR, Estimated: >60 mL/min (ref >60)
Glucose, Bld: 99 mg/dL (ref 70–99)
Potassium: 3.4 mmol/L — ABNORMAL LOW (ref 3.5–5.1)
Sodium: 136 mmol/L (ref 135–145)
Total Bilirubin: 1 mg/dL (ref 0.3–1.2)
Total Protein: 5.4 g/dL — ABNORMAL LOW (ref 6.5–8.1)

## 2023-03-12 LAB — PROTIME-INR
INR: 1.7 — ABNORMAL HIGH (ref 0.8–1.2)
Prothrombin Time: 19.7 seconds — ABNORMAL HIGH (ref 11.4–15.2)

## 2023-03-12 LAB — HEMOGLOBIN AND HEMATOCRIT, BLOOD
HCT: 27 % — ABNORMAL LOW (ref 36.0–46.0)
Hemoglobin: 8.7 g/dL — ABNORMAL LOW (ref 12.0–15.0)

## 2023-03-12 MED ORDER — FUROSEMIDE 40 MG PO TABS: 40.0000 mg | ORAL_TABLET | Freq: Two times a day (BID) | ORAL | 2 refills | Status: DC

## 2023-03-12 MED ORDER — SPIRONOLACTONE 100 MG PO TABS: 200.0000 mg | ORAL_TABLET | Freq: Every day | ORAL | 2 refills | Status: DC

## 2023-03-12 MED ORDER — NADOLOL 20 MG PO TABS: 20.0000 mg | ORAL_TABLET | Freq: Every day | ORAL | 2 refills | Status: DC

## 2023-03-12 MED ORDER — TRIAMCINOLONE ACETONIDE 0.5 % EX CREA: TOPICAL_CREAM | Freq: Two times a day (BID) | CUTANEOUS | 0 refills | Status: AC

## 2023-03-12 MED ORDER — POTASSIUM CHLORIDE CRYS ER 20 MEQ PO TBCR
40.0000 meq | EXTENDED_RELEASE_TABLET | Freq: Once | ORAL | Status: AC
  Administered 2023-03-12: 40 meq via ORAL
  Filled 2023-03-12: qty 2

## 2023-03-12 MED ORDER — HYDROXYZINE HCL 25 MG PO TABS: 25.0000 mg | ORAL_TABLET | Freq: Four times a day (QID) | ORAL | 0 refills | Status: DC | PRN

## 2023-03-12 MED ORDER — PANTOPRAZOLE SODIUM 40 MG PO TBEC: 40.0000 mg | DELAYED_RELEASE_TABLET | Freq: Two times a day (BID) | ORAL | 1 refills | Status: DC

## 2023-03-12 MED ORDER — URSODIOL 300 MG PO CAPS: 300.0000 mg | ORAL_CAPSULE | Freq: Three times a day (TID) | ORAL | 2 refills | Status: DC

## 2023-03-12 MED ORDER — POTASSIUM CHLORIDE ER 20 MEQ PO TBCR: 1.0000 | EXTENDED_RELEASE_TABLET | Freq: Two times a day (BID) | ORAL | Status: DC

## 2023-03-12 MED ORDER — ENSURE ENLIVE PO LIQD: 237.0000 mL | Freq: Two times a day (BID) | ORAL | 12 refills | Status: DC

## 2023-03-12 NOTE — Telephone Encounter (Signed)
Angelica Chessman -please make patient a hospital follow-up in 3-4 weeks with Mosetta Pigeon, Dr. Marletta Lor, or other any APP if not available..  Mindy/Tammy -please send a referral to portal hypertension clinic (IR) for evaluation of TIPS.  Jacques Earthly -please ensure patient gets a CMP and CBC in 1 week  Brooke Bonito, MSN, APRN, FNP-BC, AGACNP-BC [facility] [employer]

## 2023-03-12 NOTE — Progress Notes (Signed)
Gastroenterology Progress Note   Referring Provider: No ref. provider found Primary Care Physician:  Benetta Spar, MD Primary Gastroenterologist:  Dr. Marletta Lor  Patient ID: Mallory Mendoza; [id number]; [birth year]    Subjective  Mild abdominal pain but much improved from prior. Having normal brown stools. No melena. Tolerating diet without nausea/vomiting. Slightly decreased appetite.   Objective   Vital signs in last 24 hours Temp:  [98.1 F (36.7 C)-98.2 F (36.8 C)] 98.2 F (36.8 C) (04/26 0412) Pulse Rate:  [89-108] 89 (04/26 0412) Resp:  [16-18] 16 (04/26 0412) BP: (95-119)/(61-84) 98/68 (04/26 0412) SpO2:  [100 %] 100 % (04/26 0412) Last BM Date : 03/11/23  Physical Exam General:   Alert and oriented, pleasant Head:  Normocephalic and atraumatic. Eyes:  No icterus, sclera clear. Conjuctiva pink.  Mouth:  Without lesions, mucosa pink and moist.  Neck:  Supple, without thyromegaly or masses.  Heart:  S1, S2 present, no murmurs noted.  Lungs: Clear to auscultation bilaterally, without wheezing, rales, or rhonchi.  Abdomen:  Bowel sounds present, soft, non-distended. Mild ttp to mid left abdomen. No HSM or hernias noted. No rebound or guarding. No masses appreciated  Msk:  Symmetrical without gross deformities. Normal posture. Extremities:  Without clubbing or edema. Neurologic:  Alert and  oriented x4;  grossly normal neurologically. Skin:  Warm and dry, intact without significant lesions.  Cervical Nodes:  No significant cervical adenopathy. Psych:  Alert and cooperative. Normal mood and affect.  Intake/Output from previous day: 04/25 0701 - 04/26 0700 In: 1300 [P.O.:1200; IV Piggyback:100] Out: -  Intake/Output this shift: No intake/output data recorded.  Lab Results  Recent Labs    03/09/23 1205 03/11/23 0511 03/12/23 0445  WBC 8.0 6.8 6.0  HGB 8.8* 7.7* 7.5*  HCT 27.1* 23.7* 22.2*  PLT 206 187 166   BMET Recent Labs    03/09/23 1205  03/11/23 0511 03/11/23 1507 03/12/23 0445  NA 133* 135  --  136  K 3.1* 3.2* 3.6 3.4*  CL 109 111  --  110  CO2 16* 18*  --  20*  GLUCOSE 126* 98  --  99  BUN 17 8  --  8  CREATININE 0.84 0.74  --  0.69  CALCIUM 8.0* 8.2*  --  8.5*   LFT Recent Labs    03/09/23 1205 03/12/23 0445  PROT 6.4* 5.4*  ALBUMIN 2.7* 2.1*  AST 20 19  ALT 10 9  ALKPHOS 57 52  BILITOT 0.8 1.0   PT/INR Recent Labs    03/09/23 1205 03/12/23 0445  LABPROT 17.0* 19.7*  INR 1.4* 1.7*   Hepatitis Panel No results for input(s): "HEPBSAG", "HCVAB", "HEPAIGM", "HEPBIGM" in the last 72 hours.  Studies/Results US Paracentesis  Result Date: 03/11/2023 INDICATION: Patient with abdominal distention, recurrent ascites. Request made for diagnostic and therapeutic paracentesis EXAM: ULTRASOUND GUIDED THERAPEUTIC PARACENTESIS MEDICATIONS: 10 mL 1% lidocaine COMPLICATIONS: None immediate. PROCEDURE: Informed written consent was obtained from the patient after a discussion of the risks, benefits and alternatives to treatment. A timeout was performed prior to the initiation of the procedure. Initial ultrasound scanning demonstrates a large amount of ascites within the right lower abdominal quadrant. The right lower abdomen was prepped and draped in the usual sterile fashion. 1% lidocaine was used for local anesthesia. Following this, a 19 gauge, 7-cm, Yueh catheter was introduced. An ultrasound image was saved for documentation purposes. The paracentesis was performed. The catheter was removed and a dressing was applied. The patient  tolerated the procedure well without immediate post procedural complication. FINDINGS: A total of approximately 5.0 liters of amber fluid was removed. Samples were sent to the laboratory as requested by the clinical team. IMPRESSION: Successful ultrasound-guided paracentesis yielding 5.0 liters of peritoneal fluid. Read by: Loyce Dys PA-C PLAN: The patient has previously been formally  evaluated by the [facility] Interventional Radiology Portal Hypertension Clinic and is being actively followed for potential future intervention. Electronically Signed   By: Richarda Overlie M.D.   On: 03/11/2023 14:03   US Paracentesis  Result Date: 03/05/2023 INDICATION: Patient with a history of cirrhosis with recurrent ascites. Interventional Radiology asked to perform a diagnostic and therapeutic paracentesis with a 5 L max. EXAM: ULTRASOUND GUIDED PARACENTESIS MEDICATIONS: 1% lidocaine 10 ml COMPLICATIONS: None immediate. PROCEDURE: Informed written consent was obtained from the patient after a discussion of the risks, benefits and alternatives to treatment. A timeout was performed prior to the initiation of the procedure. Initial ultrasound scanning demonstrates a large amount of ascites within the right lower abdominal quadrant. The right lower abdomen was prepped and draped in the usual sterile fashion. 1% lidocaine was used for local anesthesia. Following this, a 19 gauge, 7-cm, Yueh catheter was introduced. An ultrasound image was saved for documentation purposes. The paracentesis was performed. The catheter was removed and a dressing was applied. The patient tolerated the procedure well without immediate post procedural complication. Patient received post-procedure intravenous albumin; see nursing notes for details. FINDINGS: A total of approximately 4.6 L of clear yellow fluid was removed. Samples were sent to the laboratory as requested by the clinical team. IMPRESSION: Successful ultrasound-guided paracentesis yielding 4.6 liters of peritoneal fluid. Read by: Alwyn Ren, NP PLAN: The patient has required >/=2 paracenteses in a 30 day period and a formal evaluation by the [facility] Interventional Radiology Portal Hypertension Clinic has been arranged. ** Please see Dr. Jerrye Noble note dated 02/11/23 ** Electronically Signed   By: Mauri Reading  Mir M.D.   On: 03/05/2023 11:21   US Paracentesis  Result  Date: 02/11/2023 INDICATION: Cirrhosis; recurrent ascites EXAM: ULTRASOUND GUIDED RLQ PARACENTESIS MEDICATIONS: 10 cc 1% lidocaine. COMPLICATIONS: None immediate. PROCEDURE: Informed written consent was obtained from the patient after a discussion of the risks, benefits and alternatives to treatment. A timeout was performed prior to the initiation of the procedure. Initial ultrasound scanning demonstrates a large amount of ascites within the right lower abdominal quadrant. The right lower abdomen was prepped and draped in the usual sterile fashion. 1% lidocaine was used for local anesthesia. Following this, a 19 gauge, 7-cm, Yueh catheter was introduced. An ultrasound image was saved for documentation purposes. The paracentesis was performed. The catheter was removed and a dressing was applied. The patient tolerated the procedure well without immediate post procedural complication. Patient received post-procedure intravenous albumin; see nursing notes for details. FINDINGS: A total of approximately 4.3 liters of yellow fluid was removed. Samples were sent to the laboratory as requested by the clinical team. IMPRESSION: Successful ultrasound-guided paracentesis yielding 4.3 liters of peritoneal fluid. PLAN: The patient has required >/=2 paracenteses in a 30 day period and a formal evaluation by the [facility] Interventional Radiology Portal Hypertension Clinic has been arranged. Read by Robet Leu [facility] Electronically Signed   By: Ulyses Southward M.D.   On: 02/11/2023 10:33    Assessment  42 y.o. female with a history of anxiety, depression, decompensated ETOH cirrhosis, PBC, alcoholic otitis in December 2023, hepatic encephalopathy, previously hospitalized for anasarca and required repeat paracentesis for  ascites since February 2024.  Admitted in March 2024 for possible SBP due to positive Gram stain but culture with Staph epidermidis and felt to be a contaminant however had decline of hemoglobin to 6.7 during  that admission requiring 1 unit PRBC.  She underwent EGD with no evidence of varices, medium amount of food in the stomach precluding complete exam with recommendations for outpatient EGD and colonoscopy.  She represented to the ED 4/22 reporting diarrhea and nausea since paracentesis on 4/19 and melena in the waiting room.  Acute onset nausea/vomiting/diarrhea: Unclear etiology.  Symptoms have resolved.  Melena: Likely secondary to cirrhosis and frequent NSAID use.  Compliant with PPI twice daily until she ran out of supply.  Hemoglobin 10.4 on admission with slow decline to 8.8.  Hemoglobin was 7.7 yesterday but stable at 7.5 today.  EGD 4/24 with no evidence of varices, severe portal gastropathy with touch friability/oozing, normal duodenum.  She was started on nadolol 20 mg daily yesterday and advised titration for heart rate in the 60s. HR stable in the 80's today.  BP stable.  Abdominal pain: Has chronic left upper quadrant and left lower quadrant abdominal pain.  No evidence of SBP on recent paracentesis.  CT A/P in February without acute abnormality.  Mildly tender today on exam.  Need to consider outpatient colonoscopy.  Irregular vaginal bleeding: GYN outpatient evaluation.  Decompensated cirrhosis: Secondary to PBC and EtOH.  Course been complicated by recurrent ascites requiring frequent paracenteses (last para 03/11/2023 with removal of 5 L, negative for SBP) and history of hepatic encephalopathy.  She is maintained on lactulose and rifaximin.  Has been compliant with diuretic therapy at home with 40 mg of Lasix daily and spironolactone 100 mg daily.  Given her need for frequent paracenteses, her diuretic regimen has been increased to Lasix 40 mg twice daily and spironolactone 200 mg daily, thus far her renal function is stable.  Given her frequent need for paracenteses she will be referred outpatient to IR to discuss possible TIPS.  Plan / Recommendations  Nadolol 20 mg daily, titrate for  HR 60s Ursodiol 300 mg TID Lactulose, titrate for 3 BM daily PPI BID Xifaxin 550 mg BID 2g sodium diet Lasix 40 mg BID and Spironolactone 200 mg daily Outpatient colonoscopy Will refer to IR outpatient for evaluation of TIPS and discuss the risks/benefits.  CBC and CMP in 1 week Follow up outpatient in 3 weeks    LOS: 3 days    03/12/2023, 11:40 AM   Brooke Bonito, MSN, FNP-BC, AGACNP-BC [facility]

## 2023-03-12 NOTE — Discharge Instructions (Signed)
Mallory Mendoza,  You were in the hospital because of GI bleeding. This appears to have been related to high blood pressure within the veins around your stomach. You have been started on medication to help with this elevated pressure issue. Please follow-up with your PCP as you will need to have repeat blood work done to check your potassium and hemoglobin levels. Please also follow-up with the GI doctor.

## 2023-03-12 NOTE — Discharge Summary (Signed)
Physician Discharge Summary   Patient: Mallory Mendoza MRN: [id number] DOB: October 05, 1981  Admit date:     03/08/2023  Discharge date: 03/12/23  Discharge Physician: Jacquelin Hawking, MD   PCP: Benetta Spar, MD   Recommendations at discharge:  PCP follow-up Repeat BMP to check potassium Recheck CBC to check hemoglobin Follow-up with GI Follow-up with IR for consideration of TIPS  Discharge Diagnoses: Principal Problem:   GI bleed Active Problems:   Alcoholic cirrhosis (HCC)   GERD without esophagitis   Abdominal pain   Nausea & vomiting   Acute diarrhea   Elevated lipase   Hypoalbuminemia due to protein-calorie malnutrition (HCC)   Melena   Portal hypertensive gastropathy (HCC)  Resolved Problems:   * No resolved hospital problems. *  Hospital Course: Mallory Mendoza is a 42 y.o. female with a history of alcoholic cirrhosis, anxiety, GERD. Patient presented secondary to diarrhea and abdominal pain but was also found to have evidence of melena. Protonix IV started. GI consulted and performed an upper endoscopy procedure on 4/24, significant for severe portal gastropathy.  Assessment and Plan:  Acute GI bleeding Melena noted in history. Patient with known gastritis but no prior evidence of varices. Complicated by NSAID use. Empiric Protonix IV started. GI consulted and performed an upper endoscopy on 4/24, which was significant for evidence of portal gastropathy with gastric oozing/friability noted. Patient discharged on Protonix. Recommendation for TIPS evaluation. Nadolol started for portal hypertensive gastropathy.   Normocytic anemia Chronic. Mild downward trend that stabilized prior to discharge.   Abdominal pain Lower abdominal. Unclear etiology but possibly related to ascites. No encephalopathy or fevers. Abdominal pain previously evaluated by CT abdomen without identification of etiology. Improved with paracentesis. Ascites fluid not suggestive of SBP.    Nausea/vomiting Unclear etiology. Possibly related to peptic ulcer disease. No emesis.   Diarrhea Appears to have improved. Possibly related to GI bleeding.   Hypokalemia Potassium supplementation. Recheck BMP next week   Nonanion gap metabolic acidosis Mild. Improved prior to discharge.   Left ankle rash Unclear etiology. Very itchy. Improved with triamcinolone cream; continue cream on discharge for 5 more days.   Pruritis Unclear etiology. Chronic issue. Improved with skin moisturizing and hydroxyzine PRN for symptoms.   Elevated lipase Lipase of 61 on admission. No symptoms consistent with acute pancreatitis.   Alcoholic cirrhosis of the liver Portal hypertensive gastropathy Associated ascites, requiring paracenteses. TIPS recommended. Patient discharged on Nadolol. GI increased patient to furosemide 40 mg BID and spironolactone 200 mg daily.   GERD Continue Protonix   Consultants: Gastroenterology Procedures performed:  4/24: Upper GI endoscopy    Disposition: Home Diet recommendation: 2 gram sodium diet  DISCHARGE MEDICATION: Allergies as of 03/12/2023   No Known Allergies      Medication List     STOP taking these medications    Advil PM 200-25 MG Caps Generic drug: Ibuprofen-diphenhydrAMINE HCl   metoprolol tartrate 25 MG tablet Commonly known as: LOPRESSOR   mirtazapine 7.5 MG tablet Commonly known as: REMERON       TAKE these medications    feeding supplement Liqd Take 237 mLs by mouth 2 (two) times daily between meals.   folic acid 1 MG tablet Commonly known as: FOLVITE Take 1 mg by mouth daily.   furosemide 40 MG tablet Commonly known as: LASIX Take 1 tablet (40 mg total) by mouth 2 (two) times daily. What changed: when to take this   gabapentin 100 MG capsule Commonly known as: NEURONTIN  Take 100 mg by mouth 2 (two) times daily.   hydrOXYzine 25 MG tablet Commonly known as: ATARAX Take 1 tablet (25 mg total) by mouth every 6  (six) hours as needed for itching.   lactulose 10 GM/15ML solution Commonly known as: CHRONULAC Take 30 mLs (20 g total) by mouth 3 (three) times daily. What changed: how much to take   nadolol 20 MG tablet Commonly known as: CORGARD Take 1 tablet (20 mg total) by mouth daily. Start taking on: March 13, 2023   nicotine 21 mg/24hr patch Commonly known as: NICODERM CQ - dosed in mg/24 hours SMARTSIG:Topical   pantoprazole 40 MG tablet Commonly known as: Protonix Take 1 tablet (40 mg total) by mouth 2 (two) times daily.   Potassium Chloride ER 20 MEQ Tbcr Take 1 tablet (20 mEq total) by mouth 2 (two) times daily for 2 days. Start taking on: March 13, 2023   rifaximin 550 MG Tabs tablet Commonly known as: XIFAXAN Take 1 tablet (550 mg total) by mouth 2 (two) times daily.   spironolactone 100 MG tablet Commonly known as: ALDACTONE Take 2 tablets (200 mg total) by mouth daily. Start taking on: March 13, 2023 What changed: how much to take   triamcinolone cream 0.5 % Commonly known as: KENALOG Apply topically 2 (two) times daily for 5 days. Apply to left ankle rash.   ursodiol 300 MG capsule Commonly known as: ACTIGALL Take 1 capsule (300 mg total) by mouth 3 (three) times daily.        Follow-up Information     Fanta, Wayland Salinas, MD. Schedule an appointment as soon as possible for a visit in 1 week(s).   Specialty: Internal Medicine Why: For hospital follow-up Contact information: [address] [place] [address] [phone]         Lanelle Bal, DO. Schedule an appointment as soon as possible for a visit.   Specialty: Gastroenterology Why: For hospital follow-up Contact information: [address] [place] [address] [phone]                Discharge Exam: BP 98/68 (BP Location: Right Arm)   Pulse 89   Temp 98.2 F (36.8 C) (Oral)   Resp 16   Ht 5\' 4"  (1.626 m)   Wt 57.7 kg   LMP 02/05/2023   SpO2 100%   BMI  21.83 kg/m   General exam: Appears calm and comfortable Respiratory system: Clear to auscultation. Respiratory effort normal. Cardiovascular system: S1 & S2 heard, RRR. No murmurs, rubs, gallops or clicks. Gastrointestinal system: Abdomen is distended, soft and mildly tender. Normal bowel sounds heard. Central nervous system: Alert and oriented. No focal neurological deficits. Psychiatry: Judgement and insight appear normal. Mood & affect appropriate.   Condition at discharge: stable  The results of significant diagnostics from this hospitalization (including imaging, microbiology, ancillary and laboratory) are listed below for reference.   Imaging Studies: US Paracentesis  Result Date: 03/11/2023 INDICATION: Patient with abdominal distention, recurrent ascites. Request made for diagnostic and therapeutic paracentesis EXAM: ULTRASOUND GUIDED THERAPEUTIC PARACENTESIS MEDICATIONS: 10 mL 1% lidocaine COMPLICATIONS: None immediate. PROCEDURE: Informed written consent was obtained from the patient after a discussion of the risks, benefits and alternatives to treatment. A timeout was performed prior to the initiation of the procedure. Initial ultrasound scanning demonstrates a large amount of ascites within the right lower abdominal quadrant. The right lower abdomen was prepped and draped in the usual sterile fashion. 1% lidocaine was used for local  anesthesia. Following this, a 19 gauge, 7-cm, Yueh catheter was introduced. An ultrasound image was saved for documentation purposes. The paracentesis was performed. The catheter was removed and a dressing was applied. The patient tolerated the procedure well without immediate post procedural complication. FINDINGS: A total of approximately 5.0 liters of amber fluid was removed. Samples were sent to the laboratory as requested by the clinical team. IMPRESSION: Successful ultrasound-guided paracentesis yielding 5.0 liters of peritoneal fluid. Read by: Loyce Dys PA-C PLAN: The patient has previously been formally evaluated by the [facility] Interventional Radiology Portal Hypertension Clinic and is being actively followed for potential future intervention. Electronically Signed   By: Richarda Overlie M.D.   On: 03/11/2023 14:03   US Paracentesis  Result Date: 03/05/2023 INDICATION: Patient with a history of cirrhosis with recurrent ascites. Interventional Radiology asked to perform a diagnostic and therapeutic paracentesis with a 5 L max. EXAM: ULTRASOUND GUIDED PARACENTESIS MEDICATIONS: 1% lidocaine 10 ml COMPLICATIONS: None immediate. PROCEDURE: Informed written consent was obtained from the patient after a discussion of the risks, benefits and alternatives to treatment. A timeout was performed prior to the initiation of the procedure. Initial ultrasound scanning demonstrates a large amount of ascites within the right lower abdominal quadrant. The right lower abdomen was prepped and draped in the usual sterile fashion. 1% lidocaine was used for local anesthesia. Following this, a 19 gauge, 7-cm, Yueh catheter was introduced. An ultrasound image was saved for documentation purposes. The paracentesis was performed. The catheter was removed and a dressing was applied. The patient tolerated the procedure well without immediate post procedural complication. Patient received post-procedure intravenous albumin; see nursing notes for details. FINDINGS: A total of approximately 4.6 L of clear yellow fluid was removed. Samples were sent to the laboratory as requested by the clinical team. IMPRESSION: Successful ultrasound-guided paracentesis yielding 4.6 liters of peritoneal fluid. Read by: Alwyn Ren, NP PLAN: The patient has required >/=2 paracenteses in a 30 day period and a formal evaluation by the [facility] Interventional Radiology Portal Hypertension Clinic has been arranged. ** Please see Dr. Jerrye Noble note dated 02/11/23 ** Electronically Signed   By: Mauri Reading   Mir M.D.   On: 03/05/2023 11:21   US Paracentesis  Result Date: 02/11/2023 INDICATION: Cirrhosis; recurrent ascites EXAM: ULTRASOUND GUIDED RLQ PARACENTESIS MEDICATIONS: 10 cc 1% lidocaine. COMPLICATIONS: None immediate. PROCEDURE: Informed written consent was obtained from the patient after a discussion of the risks, benefits and alternatives to treatment. A timeout was performed prior to the initiation of the procedure. Initial ultrasound scanning demonstrates a large amount of ascites within the right lower abdominal quadrant. The right lower abdomen was prepped and draped in the usual sterile fashion. 1% lidocaine was used for local anesthesia. Following this, a 19 gauge, 7-cm, Yueh catheter was introduced. An ultrasound image was saved for documentation purposes. The paracentesis was performed. The catheter was removed and a dressing was applied. The patient tolerated the procedure well without immediate post procedural complication. Patient received post-procedure intravenous albumin; see nursing notes for details. FINDINGS: A total of approximately 4.3 liters of yellow fluid was removed. Samples were sent to the laboratory as requested by the clinical team. IMPRESSION: Successful ultrasound-guided paracentesis yielding 4.3 liters of peritoneal fluid. PLAN: The patient has required >/=2 paracenteses in a 30 day period and a formal evaluation by the [facility] Interventional Radiology Portal Hypertension Clinic has been arranged. Read by Robet Leu [facility] Electronically Signed   By: Ulyses Southward M.D.   On: 02/11/2023 10:33  Microbiology: Results for orders placed or performed during the hospital encounter of 03/08/23  Gram stain     Status: None   Collection Time: 03/11/23 11:50 AM   Specimen: Ascitic  Result Value Ref Range Status   Specimen Description ASCITIC  Final   Special Requests NONE  Final   Gram Stain   Final    NO ORGANISMS SEEN CYTOSPIN SMEAR WBC PRESENT,BOTH PMN AND  MONONUCLEAR Performed at [facility], [address] [place], [place] [address]    Report Status 03/11/2023 FINAL  Final  Culture, body fluid w Gram Stain-bottle     Status: None (Preliminary result)   Collection Time: 03/11/23 11:50 AM   Specimen: Ascitic  Result Value Ref Range Status   Specimen Description ASCITIC  Final   Special Requests 10CC BAA  Final   Culture   Final    NO GROWTH < 24 HOURS Performed at [facility], [address], [place], [place] [address]    Report Status PENDING  Incomplete    Labs: CBC: Recent Labs  Lab 03/08/23 1737 03/09/23 1205 03/11/23 0511 03/12/23 0445 03/12/23 1248  WBC 9.3 8.0 6.8 6.0  --   NEUTROABS  --   --   --  3.2  --   HGB 10.4* 8.8* 7.7* 7.5* 8.7*  HCT 32.2* 27.1* 23.7* 22.2* 27.0*  MCV 96.7 96.8 96.3 94.1  --   PLT 230 206 187 166  --    Basic Metabolic Panel: Recent Labs  Lab 03/08/23 1737 03/09/23 1205 03/11/23 0511 03/11/23 1507 03/12/23 0445  NA 136 133* 135  --  136  K 3.6 3.1* 3.2* 3.6 3.4*  CL 109 109 111  --  110  CO2 17* 16* 18*  --  20*  GLUCOSE 86 126* 98  --  99  BUN 17 17 8   --  8  CREATININE 1.07* 0.84 0.74  --  0.69  CALCIUM 8.8* 8.0* 8.2*  --  8.5*  MG  --  1.7  --   --   --   PHOS  --  2.8  --   --   --    Liver Function Tests: Recent Labs  Lab 03/08/23 1737 03/09/23 1205 03/12/23 0445  AST 22 20 19   ALT 9 10 9   ALKPHOS 63 57 52  BILITOT 1.6* 0.8 1.0  PROT 7.4 6.4* 5.4*  ALBUMIN 3.0* 2.7* 2.1*   Discharge time spent: 35 minutes.  Signed: Jacquelin Hawking, MD [facility] 03/12/2023

## 2023-03-15 ENCOUNTER — Ambulatory Visit: Payer: Medicaid Other | Admitting: Gastroenterology

## 2023-03-15 LAB — CULTURE, BODY FLUID W GRAM STAIN -BOTTLE: Culture: NO GROWTH

## 2023-03-15 NOTE — Addendum Note (Signed)
Addended by: Armstead Peaks on: 03/15/2023 03:56 PM   Modules accepted: Orders

## 2023-03-15 NOTE — Telephone Encounter (Signed)
Referral placed to IR. Staff message sent to Chip Boer at [facility] making aware.

## 2023-03-16 ENCOUNTER — Telehealth: Payer: Self-pay

## 2023-03-16 ENCOUNTER — Other Ambulatory Visit: Payer: Self-pay | Admitting: *Deleted

## 2023-03-16 DIAGNOSIS — K7031 Alcoholic cirrhosis of liver with ascites: Secondary | ICD-10-CM

## 2023-03-16 DIAGNOSIS — K7682 Hepatic encephalopathy: Secondary | ICD-10-CM

## 2023-03-16 LAB — CULTURE, BODY FLUID W GRAM STAIN -BOTTLE

## 2023-03-16 NOTE — Transitions of Care (Post Inpatient/ED Visit) (Signed)
   03/16/2023  Name: Mallory Mendoza MRN: [id number] DOB: Oct 06, 1981  Today's TOC FU Call Status: Today's TOC FU Call Status:: Successful TOC FU Call Competed TOC FU Call Complete Date: 03/16/23  Transition Care Management Follow-up Telephone Call Date of Discharge: 03/12/23 [facility] Type of Discharge: Inpatient Admission Primary Inpatient Discharge Diagnosis:: Gastro How have you been since you were released from the hospital?: Better Any questions or concerns?: Yes Patient Questions/Concerns:: Patient states she is in need of in home services, BSW will send PCP a message to start the process  Items Reviewed: Did you receive and understand the discharge instructions provided?: Yes Medications obtained and verified?: Yes (Medications Reviewed) Dietary orders reviewed?: No Do you have support at home?: Yes  Home Care and Equipment/Supplies: Were Home Health Services Ordered?: No Any new equipment or medical supplies ordered?: No  Functional Questionnaire: Do you need assistance with meal preparation?: No Do you need assistance with eating?: No Do you have difficulty maintaining continence: No Do you need assistance with getting out of bed/getting out of a chair/moving?: Yes Do you have difficulty managing or taking your medications?: No  Follow up appointments reviewed: PCP Follow-up appointment confirmed?: Yes Date of PCP follow-up appointment?: 03/18/23 Specialist Hospital Follow-up appointment confirmed?: No Do you need transportation to your follow-up appointment?: No Do you understand care options if your condition(s) worsen?: Yes-patient verbalized understanding   SDOH Interventions    Flowsheet Row Telephone from 02/05/2023 in [place] [facility] ED to Hosp-Admission (Discharged) from 12/20/2022 in [facility] ED to Hosp-Admission (Discharged) from 10/30/2022 in [facility]  SDOH  Interventions     Housing Interventions -- Intervention Not Indicated --  Transportation Interventions Payor Benefit  [Provided patient with [employer] medical transportation [phone] -- Other (Comment), Inpatient TOC  [Pt states she does not have difficulty with transportation.]        Gus Puma, BSW, [facility]  Managed [facility] Social Worker [phone]

## 2023-03-16 NOTE — Transitions of Care (Post Inpatient/ED Visit) (Signed)
   03/16/2023  Name: NALANI ANDREEN MRN: [id number] DOB: 11-02-1981  The patient was given information about care management services as a benefit of their Medicaid health plan today.   Patient                                              did not agree to enrollment in care management services and does not wish to consider at this time.   Patient would like resources for utilities and rent just to have.   Abelino Derrick, MHA [facility]  Managed [facility] Social Worker [phone]

## 2023-03-16 NOTE — Telephone Encounter (Signed)
Spoke to pt, informed to have labs completed Friday at [place]. [facility] entered into [place].

## 2023-03-18 ENCOUNTER — Ambulatory Visit
Admission: RE | Admit: 2023-03-18 | Discharge: 2023-03-18 | Disposition: A | Discharge status: Home / Self Care | Payer: Medicaid Other | Source: Ambulatory Visit | Attending: Gastroenterology | Admitting: Gastroenterology

## 2023-03-18 DIAGNOSIS — K766 Portal hypertension: Secondary | ICD-10-CM

## 2023-03-18 DIAGNOSIS — R188 Other ascites: Secondary | ICD-10-CM

## 2023-03-18 DIAGNOSIS — I8511 Secondary esophageal varices with bleeding: Secondary | ICD-10-CM | POA: Diagnosis not present

## 2023-03-18 DIAGNOSIS — D500 Iron deficiency anemia secondary to blood loss (chronic): Secondary | ICD-10-CM | POA: Diagnosis not present
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
## 2023-03-19 ENCOUNTER — Other Ambulatory Visit (HOSPITAL_COMMUNITY)
Admission: RE | Admit: 2023-03-19 | Discharge: 2023-03-19 | Disposition: A | Discharge status: Home / Self Care | Payer: Medicaid Other | Source: Ambulatory Visit | Attending: Internal Medicine | Admitting: Internal Medicine

## 2023-03-19 DIAGNOSIS — K7682 Hepatic encephalopathy: Secondary | ICD-10-CM | POA: Insufficient documentation

## 2023-03-19 DIAGNOSIS — R188 Other ascites: Secondary | ICD-10-CM | POA: Diagnosis not present

## 2023-03-19 DIAGNOSIS — K743 Primary biliary cirrhosis: Secondary | ICD-10-CM | POA: Insufficient documentation

## 2023-03-19 DIAGNOSIS — K7031 Alcoholic cirrhosis of liver with ascites: Secondary | ICD-10-CM | POA: Insufficient documentation

## 2023-03-19 LAB — HEPATIC FUNCTION PANEL
ALT: 16 U/L (ref 0–44)
AST: 32 U/L (ref 15–41)
Albumin: 2.6 g/dL — ABNORMAL LOW (ref 3.5–5.0)
Alkaline Phosphatase: 77 U/L (ref 38–126)
Bilirubin, Direct: 0.6 mg/dL — ABNORMAL HIGH (ref 0.0–0.2)
Indirect Bilirubin: 1.1 mg/dL — ABNORMAL HIGH (ref 0.3–0.9)
Total Bilirubin: 1.7 mg/dL — ABNORMAL HIGH (ref 0.3–1.2)
Total Protein: 7 g/dL (ref 6.5–8.1)

## 2023-03-19 LAB — CBC WITH DIFFERENTIAL/PLATELET
Abs Immature Granulocytes: 0.05 10*3/uL (ref 0.00–0.07)
Basophils Absolute: 0 10*3/uL (ref 0.0–0.1)
Basophils Relative: 0 %
Eosinophils Absolute: 0.2 10*3/uL (ref 0.0–0.5)
Eosinophils Relative: 2 %
HCT: 26 % — ABNORMAL LOW (ref 36.0–46.0)
Hemoglobin: 8.6 g/dL — ABNORMAL LOW (ref 12.0–15.0)
Immature Granulocytes: 1 %
Lymphocytes Relative: 17 %
Lymphs Abs: 1.6 10*3/uL (ref 0.7–4.0)
MCH: 31.4 pg (ref 26.0–34.0)
MCHC: 33.1 g/dL (ref 30.0–36.0)
MCV: 94.9 fL (ref 80.0–100.0)
Monocytes Absolute: 0.8 10*3/uL (ref 0.1–1.0)
Monocytes Relative: 8 %
Neutro Abs: 7.1 10*3/uL (ref 1.7–7.7)
Neutrophils Relative %: 72 %
Platelets: 221 10*3/uL (ref 150–400)
RBC: 2.74 MIL/uL — ABNORMAL LOW (ref 3.87–5.11)
RDW: 16.1 % — ABNORMAL HIGH (ref 11.5–15.5)
WBC: 9.8 10*3/uL (ref 4.0–10.5)
nRBC: 0 % (ref 0.0–0.2)

## 2023-03-19 LAB — COMPREHENSIVE METABOLIC PANEL
ALT: 16 U/L (ref 0–44)
AST: 31 U/L (ref 15–41)
Albumin: 2.7 g/dL — ABNORMAL LOW (ref 3.5–5.0)
Alkaline Phosphatase: 77 U/L (ref 38–126)
Anion gap: 9 (ref 5–15)
BUN: 13 mg/dL (ref 6–20)
CO2: 21 mmol/L — ABNORMAL LOW (ref 22–32)
Calcium: 8.1 mg/dL — ABNORMAL LOW (ref 8.9–10.3)
Chloride: 103 mmol/L (ref 98–111)
Creatinine, Ser: 1.01 mg/dL — ABNORMAL HIGH (ref 0.44–1.00)
GFR, Estimated: >60 mL/min (ref >60)
Glucose, Bld: 121 mg/dL — ABNORMAL HIGH (ref 70–99)
Potassium: 3.8 mmol/L (ref 3.5–5.1)
Sodium: 133 mmol/L — ABNORMAL LOW (ref 135–145)
Total Bilirubin: 1.6 mg/dL — ABNORMAL HIGH (ref 0.3–1.2)
Total Protein: 7 g/dL (ref 6.5–8.1)

## 2023-03-19 LAB — BASIC METABOLIC PANEL
Anion gap: 9 (ref 5–15)
BUN: 13 mg/dL (ref 6–20)
CO2: 21 mmol/L — ABNORMAL LOW (ref 22–32)
Calcium: 8.1 mg/dL — ABNORMAL LOW (ref 8.9–10.3)
Chloride: 103 mmol/L (ref 98–111)
Creatinine, Ser: 1.02 mg/dL — ABNORMAL HIGH (ref 0.44–1.00)
GFR, Estimated: >60 mL/min (ref >60)
Glucose, Bld: 121 mg/dL — ABNORMAL HIGH (ref 70–99)
Potassium: 3.8 mmol/L (ref 3.5–5.1)
Sodium: 133 mmol/L — ABNORMAL LOW (ref 135–145)

## 2023-03-22 DIAGNOSIS — F411 Generalized anxiety disorder: Secondary | ICD-10-CM | POA: Diagnosis not present

## 2023-03-22 DIAGNOSIS — R2681 Unsteadiness on feet: Secondary | ICD-10-CM | POA: Diagnosis not present

## 2023-03-22 DIAGNOSIS — G629 Polyneuropathy, unspecified: Secondary | ICD-10-CM | POA: Diagnosis not present

## 2023-03-23 ENCOUNTER — Other Ambulatory Visit: Payer: Self-pay | Admitting: *Deleted

## 2023-03-23 ENCOUNTER — Other Ambulatory Visit: Payer: Self-pay | Admitting: Interventional Radiology

## 2023-03-23 DIAGNOSIS — Z01818 Encounter for other preprocedural examination: Secondary | ICD-10-CM

## 2023-03-23 DIAGNOSIS — K7011 Alcoholic hepatitis with ascites: Secondary | ICD-10-CM

## 2023-03-23 DIAGNOSIS — R5381 Other malaise: Secondary | ICD-10-CM | POA: Diagnosis not present

## 2023-03-23 DIAGNOSIS — R000 Tachycardia, unspecified: Secondary | ICD-10-CM | POA: Diagnosis not present

## 2023-03-23 DIAGNOSIS — R188 Other ascites: Secondary | ICD-10-CM

## 2023-03-23 NOTE — Addendum Note (Signed)
Addended by: Elinor Dodge on: 03/23/2023 03:40 PM   Modules accepted: Orders
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
## 2023-03-23 NOTE — Progress Notes (Signed)
error 

## 2023-03-24 ENCOUNTER — Ambulatory Visit (HOSPITAL_COMMUNITY)
Admission: RE | Admit: 2023-03-24 | Discharge: 2023-03-24 | Disposition: A | Discharge status: Home / Self Care | Payer: Medicaid Other | Source: Ambulatory Visit | Attending: Gastroenterology | Admitting: Gastroenterology

## 2023-03-24 ENCOUNTER — Other Ambulatory Visit: Payer: Self-pay | Admitting: *Deleted

## 2023-03-24 ENCOUNTER — Ambulatory Visit (INDEPENDENT_AMBULATORY_CARE_PROVIDER_SITE_OTHER): Payer: Medicaid Other | Admitting: Gastroenterology

## 2023-03-24 ENCOUNTER — Encounter: Payer: Self-pay | Admitting: Gastroenterology

## 2023-03-24 ENCOUNTER — Encounter (HOSPITAL_COMMUNITY): Payer: Self-pay

## 2023-03-24 VITALS — BP 99/65 | HR 103 | Temp 98.1°F | Ht 64.0 in | Wt 127.2 lb

## 2023-03-24 DIAGNOSIS — R188 Other ascites: Secondary | ICD-10-CM

## 2023-03-24 DIAGNOSIS — D649 Anemia, unspecified: Secondary | ICD-10-CM | POA: Diagnosis not present

## 2023-03-24 DIAGNOSIS — K743 Primary biliary cirrhosis: Secondary | ICD-10-CM | POA: Diagnosis not present

## 2023-03-24 DIAGNOSIS — R109 Unspecified abdominal pain: Secondary | ICD-10-CM | POA: Diagnosis not present

## 2023-03-24 LAB — GRAM STAIN: Gram Stain: NONE SEEN

## 2023-03-24 LAB — BODY FLUID CELL COUNT WITH DIFFERENTIAL
Eos, Fluid: 0 %
Lymphs, Fluid: 52 %
Monocyte-Macrophage-Serous Fluid: 31 % — ABNORMAL LOW (ref 50–90)
Neutrophil Count, Fluid: 17 % (ref 0–25)
Total Nucleated Cell Count, Fluid: 135 cu mm (ref 0–1000)

## 2023-03-24 MED ORDER — ALBUMIN HUMAN 25 % IV SOLN
INTRAVENOUS | Status: AC
  Filled 2023-03-24: qty 100

## 2023-03-24 MED ORDER — ALBUMIN HUMAN 25 % IV SOLN
0.0000 g | Freq: Once | INTRAVENOUS | Status: AC
  Administered 2023-03-24: 25 g via INTRAVENOUS
  Filled 2023-03-24: qty 400

## 2023-03-24 NOTE — Progress Notes (Signed)
Patient tolerated right sided paracentesis procedure and 25G of IV albumin well today and 6 Liters of clear yellow ascites removed with labs collected and sent for processing. Patient verbalized understanding of discharge instructions and ambulatory with walker at departure with no acute distress noted.

## 2023-03-24 NOTE — Procedures (Signed)
PROCEDURE SUMMARY:  Successful ultrasound guided paracentesis from the right lower quadrant.  Yielded 6 L of clear yellow fluid.  No immediate complications.  The patient tolerated the procedure well.   Specimen sent for labs.  EBL < 2 mL  The patient has previously been formally evaluated by the [facility] and is being actively followed for potential future intervention.  ** Pending TIPS with Dr. Grace Isaac. Please see note dated 03/18/23  Alwyn Ren, AGACNP-BC [phone] 03/24/2023, 11:43 AM

## 2023-03-24 NOTE — Patient Instructions (Addendum)
Please have blood work completed at [facility]  We will refer you to [facility] in [place] for transplant evaluation.   For now, continue Lasix 40 mg twice daily and spironolactone 100 mg daily.  I will make adjustments to this once I have your blood work back.  Continue Xifaxan 550 mg twice daily.  Continue lactulose 30g twice daily with a goal of 4 bowel movements per day.  Continue ursodiol 300 mg 3 times a day  We will place standing orders for paracentesis.  You can have paracentesis up to once weekly if needed.  Continue pantoprazole 40 mg twice daily.  Follow-up with your primary care doctor regarding your trouble with sleep.  Will plan to see you back in 4-6 weeks.  Ermalinda Memos, PA-C [facility]

## 2023-03-25 LAB — CULTURE, BODY FLUID W GRAM STAIN -BOTTLE: Culture: NO GROWTH

## 2023-03-26 LAB — PATHOLOGIST SMEAR REVIEW

## 2023-03-28 ENCOUNTER — Encounter: Payer: Self-pay | Admitting: Gastroenterology

## 2023-03-28 LAB — CULTURE, BODY FLUID W GRAM STAIN -BOTTLE

## 2023-03-30 ENCOUNTER — Ambulatory Visit (HOSPITAL_COMMUNITY)
Admission: RE | Admit: 2023-03-30 | Discharge: 2023-03-30 | Disposition: A | Discharge status: Home / Self Care | Payer: Medicaid Other | Source: Ambulatory Visit | Attending: Gastroenterology | Admitting: Gastroenterology

## 2023-03-30 ENCOUNTER — Encounter (HOSPITAL_COMMUNITY): Payer: Self-pay

## 2023-03-30 DIAGNOSIS — R188 Other ascites: Secondary | ICD-10-CM | POA: Diagnosis not present

## 2023-03-30 DIAGNOSIS — K7460 Unspecified cirrhosis of liver: Secondary | ICD-10-CM | POA: Diagnosis not present

## 2023-03-30 LAB — GRAM STAIN

## 2023-03-30 LAB — BODY FLUID CELL COUNT WITH DIFFERENTIAL
Eos, Fluid: 0 %
Lymphs, Fluid: 38 %
Monocyte-Macrophage-Serous Fluid: 48 % — ABNORMAL LOW (ref 50–90)
Neutrophil Count, Fluid: 14 % (ref 0–25)
Total Nucleated Cell Count, Fluid: 131 cu mm (ref 0–1000)

## 2023-03-30 MED ORDER — ALBUMIN HUMAN 25 % IV SOLN
INTRAVENOUS | Status: AC
  Filled 2023-03-30: qty 100

## 2023-03-30 MED ORDER — ALBUMIN HUMAN 25 % IV SOLN
0.0000 g | Freq: Once | INTRAVENOUS | Status: AC
  Administered 2023-03-30: 25 g via INTRAVENOUS

## 2023-03-30 NOTE — Progress Notes (Signed)
Patient tolerated right sided paracentesis procedure and 25G of IV Albumin well today and 5 Liters of clear yellow ascites removed with labs collected/sent for processing. Patient verbalized understanding of discharge instructions and ambulatory at departure with no acute distress noted.

## 2023-03-31 ENCOUNTER — Telehealth: Payer: Self-pay | Admitting: *Deleted

## 2023-03-31 NOTE — Telephone Encounter (Signed)
Received fax from [facility] patient wanted to discuss prior to scheduling.  Called pt. She wanted to know why she was going to them. I advised it was for liver transplant eval. She then asked about her tips eval. I advised she will need to contact IR. She stated they last called her about 3 weeks ago. I have her [facility] # and I also told her to call [facility] # to scheduler her appt also. She voiced understanding

## 2023-04-04 LAB — CULTURE, BODY FLUID W GRAM STAIN -BOTTLE: Culture: NO GROWTH

## 2023-04-05 ENCOUNTER — Encounter (HOSPITAL_COMMUNITY): Payer: Self-pay | Admitting: *Deleted

## 2023-04-05 ENCOUNTER — Observation Stay (HOSPITAL_COMMUNITY)
Admission: EM | Admit: 2023-04-05 | Discharge: 2023-04-06 | Disposition: A | Discharge status: Home / Self Care | Payer: Medicaid Other | Attending: Family Medicine | Admitting: Family Medicine

## 2023-04-05 ENCOUNTER — Emergency Department (HOSPITAL_COMMUNITY): Payer: Medicaid Other

## 2023-04-05 ENCOUNTER — Other Ambulatory Visit: Payer: Self-pay

## 2023-04-05 DIAGNOSIS — N938 Other specified abnormal uterine and vaginal bleeding: Principal | ICD-10-CM | POA: Insufficient documentation

## 2023-04-05 DIAGNOSIS — R188 Other ascites: Secondary | ICD-10-CM | POA: Diagnosis not present

## 2023-04-05 DIAGNOSIS — K766 Portal hypertension: Secondary | ICD-10-CM | POA: Diagnosis present

## 2023-04-05 DIAGNOSIS — N179 Acute kidney failure, unspecified: Secondary | ICD-10-CM | POA: Insufficient documentation

## 2023-04-05 DIAGNOSIS — R000 Tachycardia, unspecified: Secondary | ICD-10-CM | POA: Diagnosis not present

## 2023-04-05 DIAGNOSIS — D684 Acquired coagulation factor deficiency: Secondary | ICD-10-CM | POA: Diagnosis present

## 2023-04-05 DIAGNOSIS — N939 Abnormal uterine and vaginal bleeding, unspecified: Secondary | ICD-10-CM | POA: Diagnosis present

## 2023-04-05 DIAGNOSIS — K7031 Alcoholic cirrhosis of liver with ascites: Secondary | ICD-10-CM | POA: Diagnosis present

## 2023-04-05 DIAGNOSIS — E871 Hypo-osmolality and hyponatremia: Secondary | ICD-10-CM | POA: Diagnosis present

## 2023-04-05 DIAGNOSIS — K3189 Other diseases of stomach and duodenum: Secondary | ICD-10-CM | POA: Diagnosis present

## 2023-04-05 DIAGNOSIS — F17200 Nicotine dependence, unspecified, uncomplicated: Secondary | ICD-10-CM | POA: Diagnosis present

## 2023-04-05 DIAGNOSIS — D649 Anemia, unspecified: Secondary | ICD-10-CM | POA: Diagnosis present

## 2023-04-05 DIAGNOSIS — Z9851 Tubal ligation status: Secondary | ICD-10-CM

## 2023-04-05 DIAGNOSIS — Z79899 Other long term (current) drug therapy: Secondary | ICD-10-CM | POA: Diagnosis not present

## 2023-04-05 DIAGNOSIS — N920 Excessive and frequent menstruation with regular cycle: Secondary | ICD-10-CM | POA: Diagnosis present

## 2023-04-05 DIAGNOSIS — D62 Acute posthemorrhagic anemia: Secondary | ICD-10-CM | POA: Diagnosis present

## 2023-04-05 DIAGNOSIS — E872 Acidosis, unspecified: Secondary | ICD-10-CM | POA: Diagnosis present

## 2023-04-05 DIAGNOSIS — D509 Iron deficiency anemia, unspecified: Secondary | ICD-10-CM | POA: Diagnosis present

## 2023-04-05 DIAGNOSIS — Z8249 Family history of ischemic heart disease and other diseases of the circulatory system: Secondary | ICD-10-CM

## 2023-04-05 DIAGNOSIS — F419 Anxiety disorder, unspecified: Secondary | ICD-10-CM | POA: Diagnosis present

## 2023-04-05 DIAGNOSIS — R58 Hemorrhage, not elsewhere classified: Secondary | ICD-10-CM | POA: Diagnosis not present

## 2023-04-05 DIAGNOSIS — F17210 Nicotine dependence, cigarettes, uncomplicated: Secondary | ICD-10-CM | POA: Diagnosis not present

## 2023-04-05 LAB — CBC WITH DIFFERENTIAL/PLATELET
Abs Immature Granulocytes: 0.02 10*3/uL (ref 0.00–0.07)
Basophils Absolute: 0 10*3/uL (ref 0.0–0.1)
Basophils Relative: 0 %
Eosinophils Absolute: 0.2 10*3/uL (ref 0.0–0.5)
Eosinophils Relative: 2 %
HCT: 22.3 % — ABNORMAL LOW (ref 36.0–46.0)
Hemoglobin: 7.4 g/dL — ABNORMAL LOW (ref 12.0–15.0)
Immature Granulocytes: 0 %
Lymphocytes Relative: 22 %
Lymphs Abs: 1.8 10*3/uL (ref 0.7–4.0)
MCH: 31.8 pg (ref 26.0–34.0)
MCHC: 33.2 g/dL (ref 30.0–36.0)
MCV: 95.7 fL (ref 80.0–100.0)
Monocytes Absolute: 0.6 10*3/uL (ref 0.1–1.0)
Monocytes Relative: 7 %
Neutro Abs: 5.7 10*3/uL (ref 1.7–7.7)
Neutrophils Relative %: 69 %
Platelets: 222 10*3/uL (ref 150–400)
RBC: 2.33 MIL/uL — ABNORMAL LOW (ref 3.87–5.11)
RDW: 15.2 % (ref 11.5–15.5)
WBC: 8.4 10*3/uL (ref 4.0–10.5)
nRBC: 0 % (ref 0.0–0.2)

## 2023-04-05 LAB — IRON AND TIBC
Iron: 56 ug/dL (ref 28–170)
Saturation Ratios: 35 % — ABNORMAL HIGH (ref 10.4–31.8)
TIBC: 160 ug/dL — ABNORMAL LOW (ref 250–450)
UIBC: 104 ug/dL

## 2023-04-05 LAB — COMPREHENSIVE METABOLIC PANEL
ALT: 13 U/L (ref 0–44)
AST: 29 U/L (ref 15–41)
Albumin: 2.6 g/dL — ABNORMAL LOW (ref 3.5–5.0)
Alkaline Phosphatase: 73 U/L (ref 38–126)
Anion gap: 10 (ref 5–15)
BUN: 43 mg/dL — ABNORMAL HIGH (ref 6–20)
CO2: 12 mmol/L — ABNORMAL LOW (ref 22–32)
Calcium: 8.2 mg/dL — ABNORMAL LOW (ref 8.9–10.3)
Chloride: 106 mmol/L (ref 98–111)
Creatinine, Ser: 2.59 mg/dL — ABNORMAL HIGH (ref 0.44–1.00)
GFR, Estimated: 23 mL/min — ABNORMAL LOW (ref >60)
Glucose, Bld: 111 mg/dL — ABNORMAL HIGH (ref 70–99)
Potassium: 4.1 mmol/L (ref 3.5–5.1)
Sodium: 128 mmol/L — ABNORMAL LOW (ref 135–145)
Total Bilirubin: 1 mg/dL (ref 0.3–1.2)
Total Protein: 6.4 g/dL — ABNORMAL LOW (ref 6.5–8.1)

## 2023-04-05 LAB — TYPE AND SCREEN: Unit division: 0

## 2023-04-05 LAB — PROTIME-INR
INR: 1.4 — ABNORMAL HIGH (ref 0.8–1.2)
Prothrombin Time: 17.5 seconds — ABNORMAL HIGH (ref 11.4–15.2)

## 2023-04-05 LAB — PREPARE RBC (CROSSMATCH)

## 2023-04-05 LAB — FOLATE: Folate: 37.9 ng/mL (ref >5.9)

## 2023-04-05 LAB — HCG, QUANTITATIVE, PREGNANCY: hCG, Beta Chain, Quant, S: 16 m[IU]/mL — ABNORMAL HIGH (ref <5)

## 2023-04-05 LAB — HEMOGLOBIN AND HEMATOCRIT, BLOOD
HCT: 21.1 % — ABNORMAL LOW (ref 36.0–46.0)
Hemoglobin: 7 g/dL — ABNORMAL LOW (ref 12.0–15.0)

## 2023-04-05 LAB — I-STAT BETA HCG BLOOD, ED (MC, WL, AP ONLY): I-stat hCG, quantitative: 11.4 m[IU]/mL — ABNORMAL HIGH (ref <5)

## 2023-04-05 LAB — VITAMIN B12: Vitamin B-12: 605 pg/mL (ref 180–914)

## 2023-04-05 LAB — BPAM RBC: Blood Product Expiration Date: 202406012359

## 2023-04-05 LAB — FERRITIN: Ferritin: 198 ng/mL (ref 11–307)

## 2023-04-05 MED ORDER — ONDANSETRON HCL 4 MG PO TABS: 4.0000 mg | ORAL_TABLET | Freq: Four times a day (QID) | ORAL | Status: DC | PRN

## 2023-04-05 MED ORDER — SODIUM BICARBONATE 650 MG PO TABS
1300.0000 mg | ORAL_TABLET | Freq: Three times a day (TID) | ORAL | Status: DC
  Administered 2023-04-05 – 2023-04-06 (×2): 1300 mg via ORAL
  Filled 2023-04-05 (×2): qty 2

## 2023-04-05 MED ORDER — MIRTAZAPINE 15 MG PO TABS
7.5000 mg | ORAL_TABLET | Freq: Every day | ORAL | Status: DC
  Administered 2023-04-05: 7.5 mg via ORAL
  Filled 2023-04-05: qty 1

## 2023-04-05 MED ORDER — SODIUM CHLORIDE 0.9% FLUSH
3.0000 mL | Freq: Two times a day (BID) | INTRAVENOUS | Status: DC
  Administered 2023-04-05 – 2023-04-06 (×2): 3 mL via INTRAVENOUS

## 2023-04-05 MED ORDER — FUROSEMIDE 40 MG PO TABS
40.0000 mg | ORAL_TABLET | Freq: Two times a day (BID) | ORAL | Status: DC
  Administered 2023-04-05: 40 mg via ORAL
  Filled 2023-04-05: qty 1

## 2023-04-05 MED ORDER — LACTULOSE 10 GM/15ML PO SOLN
30.0000 g | Freq: Two times a day (BID) | ORAL | Status: DC
  Administered 2023-04-05: 30 g via ORAL
  Filled 2023-04-05 (×2): qty 60

## 2023-04-05 MED ORDER — FOLIC ACID 1 MG PO TABS
1.0000 mg | ORAL_TABLET | Freq: Every day | ORAL | Status: DC
  Administered 2023-04-05 – 2023-04-06 (×2): 1 mg via ORAL
  Filled 2023-04-05 (×2): qty 1

## 2023-04-05 MED ORDER — SPIRONOLACTONE 100 MG PO TABS
200.0000 mg | ORAL_TABLET | Freq: Every day | ORAL | Status: DC
  Administered 2023-04-05: 200 mg via ORAL
  Filled 2023-04-05: qty 2

## 2023-04-05 MED ORDER — BISACODYL 10 MG RE SUPP: 10.0000 mg | Freq: Every day | RECTAL | Status: DC | PRN

## 2023-04-05 MED ORDER — ACETAMINOPHEN 325 MG PO TABS
650.0000 mg | ORAL_TABLET | Freq: Four times a day (QID) | ORAL | Status: DC | PRN
  Administered 2023-04-05: 650 mg via ORAL
  Filled 2023-04-05: qty 2

## 2023-04-05 MED ORDER — NADOLOL 20 MG PO TABS: 20.0000 mg | ORAL_TABLET | Freq: Every day | ORAL | Status: DC

## 2023-04-05 MED ORDER — SODIUM CHLORIDE 0.9% FLUSH: 3.0000 mL | INTRAVENOUS | Status: DC | PRN

## 2023-04-05 MED ORDER — TRAZODONE HCL 50 MG PO TABS: 50.0000 mg | ORAL_TABLET | Freq: Every evening | ORAL | Status: DC | PRN

## 2023-04-05 MED ORDER — ENSURE ENLIVE PO LIQD
237.0000 mL | Freq: Two times a day (BID) | ORAL | Status: DC
  Administered 2023-04-06: 237 mL via ORAL

## 2023-04-05 MED ORDER — OXYCODONE HCL 5 MG PO TABS
5.0000 mg | ORAL_TABLET | Freq: Four times a day (QID) | ORAL | Status: DC | PRN
  Administered 2023-04-05 – 2023-04-06 (×2): 5 mg via ORAL
  Filled 2023-04-05 (×2): qty 1

## 2023-04-05 MED ORDER — URSODIOL 300 MG PO CAPS
300.0000 mg | ORAL_CAPSULE | Freq: Three times a day (TID) | ORAL | Status: DC
  Administered 2023-04-05 – 2023-04-06 (×3): 300 mg via ORAL
  Filled 2023-04-05 (×6): qty 1

## 2023-04-05 MED ORDER — HYDROXYZINE HCL 25 MG PO TABS: 25.0000 mg | ORAL_TABLET | Freq: Four times a day (QID) | ORAL | Status: DC | PRN

## 2023-04-05 MED ORDER — NICOTINE 21 MG/24HR TD PT24
21.0000 mg | MEDICATED_PATCH | Freq: Every day | TRANSDERMAL | Status: DC
  Administered 2023-04-05 – 2023-04-06 (×2): 21 mg via TRANSDERMAL
  Filled 2023-04-05 (×2): qty 1

## 2023-04-05 MED ORDER — PANTOPRAZOLE SODIUM 40 MG PO TBEC
40.0000 mg | DELAYED_RELEASE_TABLET | Freq: Two times a day (BID) | ORAL | Status: DC
  Administered 2023-04-05 – 2023-04-06 (×2): 40 mg via ORAL
  Filled 2023-04-05 (×2): qty 1

## 2023-04-05 MED ORDER — SODIUM CHLORIDE 0.9% IV SOLUTION: Freq: Once | INTRAVENOUS | Status: AC

## 2023-04-05 MED ORDER — GABAPENTIN 100 MG PO CAPS
100.0000 mg | ORAL_CAPSULE | Freq: Two times a day (BID) | ORAL | Status: DC
  Administered 2023-04-05 – 2023-04-06 (×2): 100 mg via ORAL
  Filled 2023-04-05 (×2): qty 1

## 2023-04-05 MED ORDER — MEGESTROL ACETATE 40 MG PO TABS
40.0000 mg | ORAL_TABLET | Freq: Two times a day (BID) | ORAL | Status: DC
  Administered 2023-04-05: 40 mg via ORAL
  Filled 2023-04-05: qty 1

## 2023-04-05 MED ORDER — POLYETHYLENE GLYCOL 3350 17 G PO PACK: 17.0000 g | PACK | Freq: Every day | ORAL | Status: DC | PRN

## 2023-04-05 MED ORDER — MEGESTROL ACETATE 40 MG PO TABS
80.0000 mg | ORAL_TABLET | Freq: Two times a day (BID) | ORAL | Status: DC
  Administered 2023-04-05 – 2023-04-06 (×2): 80 mg via ORAL
  Filled 2023-04-05 (×4): qty 2

## 2023-04-05 MED ORDER — MEGESTROL ACETATE 40 MG PO TABS: 40.0000 mg | ORAL_TABLET | Freq: Every day | ORAL | Status: DC

## 2023-04-05 MED ORDER — SODIUM CHLORIDE 0.9% FLUSH
3.0000 mL | Freq: Two times a day (BID) | INTRAVENOUS | Status: DC
  Administered 2023-04-05 – 2023-04-06 (×3): 3 mL via INTRAVENOUS

## 2023-04-05 MED ORDER — ONDANSETRON HCL 4 MG/2ML IJ SOLN: 4.0000 mg | Freq: Four times a day (QID) | INTRAMUSCULAR | Status: DC | PRN

## 2023-04-05 MED ORDER — ACETAMINOPHEN 650 MG RE SUPP: 650.0000 mg | Freq: Four times a day (QID) | RECTAL | Status: DC | PRN

## 2023-04-05 MED ORDER — SODIUM CHLORIDE 0.9 % IV SOLN: INTRAVENOUS | Status: DC

## 2023-04-05 MED ORDER — RIFAXIMIN 550 MG PO TABS
550.0000 mg | ORAL_TABLET | Freq: Two times a day (BID) | ORAL | Status: DC
  Administered 2023-04-05 – 2023-04-06 (×2): 550 mg via ORAL
  Filled 2023-04-05 (×2): qty 1

## 2023-04-05 MED ORDER — SODIUM CHLORIDE 0.9 % IV SOLN: INTRAVENOUS | Status: DC | PRN

## 2023-04-05 NOTE — ED Notes (Signed)
Patient transported to Ultrasound 
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
## 2023-04-05 NOTE — Progress Notes (Signed)
Pt arrived to room 305 via WC from ED. Pt ambulatory without assist from chair to bed and then to foot scale to bed. A&O x4. Pt only c/o is abd pain, entire abd aching with more soreness and pressure in lower abd. DDI to RLQ abd s/p paracentesis site. Denies n/v. Oriented to room and safety procedures, states understanding. Call bell within reach, advised to call for needs, states understanding. Lab currently at bedside for blood draw .
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
## 2023-04-05 NOTE — ED Notes (Signed)
Pt transported for paracentesis  

## 2023-04-05 NOTE — ED Triage Notes (Signed)
Pt BIB for vaginal bleeding x 3 days, heavy bleeding the other night.  Hx of ovarian cyst.  Pt states with clots passing and large per pt.  Pt with cirrhosis of liver and scheduled to have paracentesis tomorrow.

## 2023-04-05 NOTE — Procedures (Signed)
PROCEDURE SUMMARY:  Successful ultrasound guided paracentesis from the right lower quadrant.  Yielded 6 L of clear yellow fluid.  No immediate complications.  The patient tolerated the procedure well.   Specimen not sent for labs.  EBL < 2 mL  The patient has previously been formally evaluated by the [facility] and is being actively followed for potential future intervention.   ** Pending TIPS with Dr. Grace Isaac. Please see note dated 03/18/23   Alwyn Ren, AGACNP-BC [phone] 04/05/2023, 3:54 PM

## 2023-04-05 NOTE — Progress Notes (Signed)
Patient tolerated right sided paracentesis procedure well today and 6 Liters of clear yellow ascites removed with no acute distress noted. Patient verbalized understanding of post procedure instructions and returned to ED bed assignment via stretcher at this time.

## 2023-04-06 ENCOUNTER — Ambulatory Visit (HOSPITAL_COMMUNITY): Admission: RE | Admit: 2023-04-06 | Payer: Medicaid Other | Source: Ambulatory Visit

## 2023-04-06 ENCOUNTER — Encounter (HOSPITAL_COMMUNITY): Payer: Self-pay

## 2023-04-06 DIAGNOSIS — N939 Abnormal uterine and vaginal bleeding, unspecified: Secondary | ICD-10-CM | POA: Diagnosis not present

## 2023-04-06 LAB — HEMOGLOBIN AND HEMATOCRIT, BLOOD
HCT: 23.6 % — ABNORMAL LOW (ref 36.0–46.0)
Hemoglobin: 7.9 g/dL — ABNORMAL LOW (ref 12.0–15.0)

## 2023-04-06 LAB — CBC
HCT: 23.9 % — ABNORMAL LOW (ref 36.0–46.0)
Hemoglobin: 8.1 g/dL — ABNORMAL LOW (ref 12.0–15.0)
MCH: 31.3 pg (ref 26.0–34.0)
MCHC: 33.9 g/dL (ref 30.0–36.0)
MCV: 92.3 fL (ref 80.0–100.0)
Platelets: 211 10*3/uL (ref 150–400)
RBC: 2.59 MIL/uL — ABNORMAL LOW (ref 3.87–5.11)
RDW: 15.2 % (ref 11.5–15.5)
WBC: 9.1 10*3/uL (ref 4.0–10.5)
nRBC: 0 % (ref 0.0–0.2)

## 2023-04-06 LAB — BPAM RBC
ISSUE DATE / TIME: 202405201800
Unit Type and Rh: 6200

## 2023-04-06 LAB — TYPE AND SCREEN
ABO/RH(D): AB POS
Antibody Screen: NEGATIVE

## 2023-04-06 LAB — BASIC METABOLIC PANEL
Anion gap: 8 (ref 5–15)
BUN: 41 mg/dL — ABNORMAL HIGH (ref 6–20)
CO2: 13 mmol/L — ABNORMAL LOW (ref 22–32)
Calcium: 8.1 mg/dL — ABNORMAL LOW (ref 8.9–10.3)
Chloride: 110 mmol/L (ref 98–111)
Creatinine, Ser: 2.18 mg/dL — ABNORMAL HIGH (ref 0.44–1.00)
GFR, Estimated: 28 mL/min — ABNORMAL LOW (ref >60)
Glucose, Bld: 107 mg/dL — ABNORMAL HIGH (ref 70–99)
Potassium: 3.6 mmol/L (ref 3.5–5.1)
Sodium: 131 mmol/L — ABNORMAL LOW (ref 135–145)

## 2023-04-06 MED ORDER — FOLIC ACID 1 MG PO TABS: 1.0000 mg | ORAL_TABLET | Freq: Every day | ORAL | 3 refills | Status: DC

## 2023-04-06 MED ORDER — SPIRONOLACTONE 100 MG PO TABS: 100.0000 mg | ORAL_TABLET | Freq: Every day | ORAL | 5 refills | Status: DC

## 2023-04-06 MED ORDER — POTASSIUM CHLORIDE ER 10 MEQ PO TBCR: 10.0000 meq | EXTENDED_RELEASE_TABLET | Freq: Every day | ORAL | 2 refills | Status: DC

## 2023-04-06 MED ORDER — FUROSEMIDE 40 MG PO TABS: 40.0000 mg | ORAL_TABLET | Freq: Two times a day (BID) | ORAL | 3 refills | Status: DC

## 2023-04-06 MED ORDER — PANTOPRAZOLE SODIUM 40 MG PO TBEC: 40.0000 mg | DELAYED_RELEASE_TABLET | Freq: Two times a day (BID) | ORAL | 5 refills | Status: DC

## 2023-04-06 MED ORDER — MIRTAZAPINE 7.5 MG PO TABS: 7.5000 mg | ORAL_TABLET | Freq: Every day | ORAL | 3 refills | Status: DC

## 2023-04-06 MED ORDER — URSODIOL 300 MG PO CAPS: 300.0000 mg | ORAL_CAPSULE | Freq: Three times a day (TID) | ORAL | 5 refills | Status: DC

## 2023-04-06 MED ORDER — RIFAXIMIN 550 MG PO TABS: 550.0000 mg | ORAL_TABLET | Freq: Two times a day (BID) | ORAL | 1 refills | Status: DC

## 2023-04-06 MED ORDER — TRAMADOL HCL 50 MG PO TABS: 50.0000 mg | ORAL_TABLET | Freq: Two times a day (BID) | ORAL | 0 refills | Status: DC | PRN

## 2023-04-06 MED ORDER — LACTULOSE 10 GM/15ML PO SOLN: 30.0000 g | Freq: Two times a day (BID) | ORAL | 3 refills | Status: DC

## 2023-04-06 MED ORDER — SODIUM BICARBONATE 650 MG PO TABS: 650.0000 mg | ORAL_TABLET | Freq: Three times a day (TID) | ORAL | 3 refills | Status: DC

## 2023-04-06 MED ORDER — NICOTINE 21 MG/24HR TD PT24: 21.0000 mg | MEDICATED_PATCH | Freq: Every day | TRANSDERMAL | 0 refills | Status: DC

## 2023-04-06 MED ORDER — MEGESTROL ACETATE 40 MG PO TABS: 40.0000 mg | ORAL_TABLET | Freq: Two times a day (BID) | ORAL | 0 refills | Status: DC

## 2023-04-06 MED ORDER — NADOLOL 20 MG PO TABS: 20.0000 mg | ORAL_TABLET | Freq: Every day | ORAL | 11 refills | Status: DC

## 2023-04-06 NOTE — Discharge Instructions (Addendum)
1)Avoid ibuprofen/Advil/Aleve/Motrin/Goody Powders/Naproxen/BC powders/Meloxicam/Diclofenac/Indomethacin and other Nonsteroidal anti-inflammatory medications as these will make you more likely to bleed and can cause stomach ulcers, can also cause Kidney problems.   2) Please follow-up with OB/GYN Dr. Lazaro Arms, MD ---at 0910 am on Friday 04/09/23  at Address: [address] [place], [place] [address], Phone: [phone] within 1 week for recheck   3)Please note that you have changes to your medications  4) repeat CBC and CMP blood test advised on Friday, 04/09/2023
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
## 2023-04-09 ENCOUNTER — Ambulatory Visit: Payer: Medicaid Other | Admitting: Obstetrics & Gynecology

## 2023-04-13 ENCOUNTER — Ambulatory Visit (HOSPITAL_COMMUNITY)
Admission: RE | Admit: 2023-04-13 | Discharge: 2023-04-13 | Disposition: A | Discharge status: Home / Self Care | Payer: Medicaid Other | Source: Ambulatory Visit | Attending: Gastroenterology | Admitting: Gastroenterology

## 2023-04-13 ENCOUNTER — Encounter (HOSPITAL_COMMUNITY): Payer: Self-pay

## 2023-04-13 DIAGNOSIS — R188 Other ascites: Secondary | ICD-10-CM | POA: Diagnosis not present

## 2023-04-13 DIAGNOSIS — K7460 Unspecified cirrhosis of liver: Secondary | ICD-10-CM | POA: Diagnosis not present

## 2023-04-13 LAB — GRAM STAIN: Gram Stain: NONE SEEN

## 2023-04-13 LAB — BODY FLUID CELL COUNT WITH DIFFERENTIAL
Eos, Fluid: 0 %
Lymphs, Fluid: 66 %
Monocyte-Macrophage-Serous Fluid: 16 % — ABNORMAL LOW (ref 50–90)
Neutrophil Count, Fluid: 18 % (ref 0–25)
Total Nucleated Cell Count, Fluid: 46 cu mm (ref 0–1000)

## 2023-04-13 MED ORDER — ALBUMIN HUMAN 25 % IV SOLN
INTRAVENOUS | Status: AC
  Filled 2023-04-13: qty 100

## 2023-04-13 MED ORDER — ALBUMIN HUMAN 25 % IV SOLN
0.0000 g | Freq: Once | INTRAVENOUS | Status: AC
  Administered 2023-04-13: 50 g via INTRAVENOUS
  Filled 2023-04-13: qty 400

## 2023-04-13 NOTE — Progress Notes (Signed)
Patient tolerated right sided paracentesis and 50G of IV albumin well today and 5 Liters of clear yellow ascites removed with labs collected and sent to lab for processing. Patient verbalized understanding of discharge instructions and ambulatory with walker at departure with no acute distress noted.

## 2023-04-13 NOTE — Procedures (Signed)
PROCEDURE SUMMARY:  Successful ultrasound guided paracentesis from the right lower quadrant.  Yielded 5 Liters of pale yellow fluid.  No immediate complications.  The patient tolerated the procedure well.   Specimen were sent for labs.  EBL < 2mL  The patient has previously been formally evaluated by the [facility] Interventional Radiology Portal Hypertension Clinic and is being actively followed for potential future intervention. Patient seen in IR clinic with IR Attending Dr. Odis Luster. ECHO scheduled for 6.28.24

## 2023-04-14 LAB — CULTURE, BODY FLUID W GRAM STAIN -BOTTLE

## 2023-04-16 ENCOUNTER — Ambulatory Visit (HOSPITAL_COMMUNITY)
Admission: RE | Admit: 2023-04-16 | Discharge: 2023-04-16 | Disposition: A | Discharge status: Home / Self Care | Payer: Medicaid Other | Source: Ambulatory Visit | Attending: Gastroenterology | Admitting: Gastroenterology

## 2023-04-16 ENCOUNTER — Encounter (HOSPITAL_COMMUNITY): Payer: Self-pay

## 2023-04-16 DIAGNOSIS — R188 Other ascites: Secondary | ICD-10-CM | POA: Insufficient documentation

## 2023-04-16 LAB — BODY FLUID CELL COUNT WITH DIFFERENTIAL
Eos, Fluid: 0 %
Lymphs, Fluid: 52 %
Monocyte-Macrophage-Serous Fluid: 25 % — ABNORMAL LOW (ref 50–90)
Neutrophil Count, Fluid: 23 % (ref 0–25)
Total Nucleated Cell Count, Fluid: 68 cu mm (ref 0–1000)

## 2023-04-16 LAB — GRAM STAIN

## 2023-04-16 MED ORDER — ALBUMIN HUMAN 25 % IV SOLN
INTRAVENOUS | Status: AC
  Filled 2023-04-16: qty 200

## 2023-04-16 MED ORDER — ALBUMIN HUMAN 25 % IV SOLN
0.0000 g | Freq: Once | INTRAVENOUS | Status: AC
  Administered 2023-04-16: 50 g via INTRAVENOUS
  Filled 2023-04-16: qty 400

## 2023-04-16 NOTE — Progress Notes (Signed)
Patient tolerated right sided paracentesis and 50G of IV albumin well today and 5 Liters of clear yellow ascites removed with labs collect/sent to lab for processing. Patient verbalized understanding of dischagre instructions and ambulatory with walker at departure with no acute distress noted.

## 2023-04-17 LAB — CULTURE, BODY FLUID W GRAM STAIN -BOTTLE
Culture: NO GROWTH
Culture: NO GROWTH

## 2023-04-18 LAB — CULTURE, BODY FLUID W GRAM STAIN -BOTTLE

## 2023-04-20 ENCOUNTER — Ambulatory Visit (HOSPITAL_COMMUNITY): Payer: Medicaid Other

## 2023-04-21 LAB — CULTURE, BODY FLUID W GRAM STAIN -BOTTLE

## 2023-04-23 ENCOUNTER — Encounter (HOSPITAL_COMMUNITY): Payer: Self-pay

## 2023-04-23 ENCOUNTER — Ambulatory Visit (HOSPITAL_COMMUNITY)
Admission: RE | Admit: 2023-04-23 | Discharge: 2023-04-23 | Disposition: A | Discharge status: Home / Self Care | Payer: Medicaid Other | Source: Ambulatory Visit | Attending: Gastroenterology | Admitting: Gastroenterology

## 2023-04-23 DIAGNOSIS — R188 Other ascites: Secondary | ICD-10-CM | POA: Diagnosis not present

## 2023-04-23 LAB — GRAM STAIN

## 2023-04-23 LAB — BODY FLUID CELL COUNT WITH DIFFERENTIAL
Eos, Fluid: 0 %
Lymphs, Fluid: 27 %
Monocyte-Macrophage-Serous Fluid: 48 % — ABNORMAL LOW (ref 50–90)
Neutrophil Count, Fluid: 25 % (ref 0–25)
Total Nucleated Cell Count, Fluid: 68 cu mm (ref 0–1000)

## 2023-04-23 MED ORDER — LIDOCAINE HCL (PF) 2 % IJ SOLN
10.0000 mL | Freq: Once | INTRAMUSCULAR | Status: AC
  Administered 2023-04-23: 10 mL

## 2023-04-23 MED ORDER — ALBUMIN HUMAN 25 % IV SOLN
INTRAVENOUS | Status: AC
  Filled 2023-04-23: qty 100

## 2023-04-23 MED ORDER — ALBUMIN HUMAN 25 % IV SOLN
0.0000 g | Freq: Once | INTRAVENOUS | Status: AC
  Administered 2023-04-23: 25 g via INTRAVENOUS
  Filled 2023-04-23: qty 400

## 2023-04-23 NOTE — Progress Notes (Signed)
Patient tolerated right sided paracentesis procedure and 25G of IV albumin well today and 5 Liters of clear yellow ascites removed with labs collected and sent for processing. Patient verbalized understanding of discharge instructions and ambulatory with walker at departure with no acute distress noted.

## 2023-04-24 LAB — CULTURE, BODY FLUID W GRAM STAIN -BOTTLE

## 2023-04-27 ENCOUNTER — Ambulatory Visit (HOSPITAL_COMMUNITY)
Admission: RE | Admit: 2023-04-27 | Discharge: 2023-04-27 | Disposition: A | Discharge status: Home / Self Care | Payer: Medicaid Other | Source: Ambulatory Visit | Attending: Gastroenterology | Admitting: Gastroenterology

## 2023-04-27 ENCOUNTER — Encounter (HOSPITAL_COMMUNITY): Payer: Self-pay

## 2023-04-27 DIAGNOSIS — R188 Other ascites: Secondary | ICD-10-CM | POA: Insufficient documentation

## 2023-04-27 LAB — CULTURE, BODY FLUID W GRAM STAIN -BOTTLE: Culture: NO GROWTH

## 2023-04-27 LAB — GRAM STAIN: Gram Stain: NONE SEEN

## 2023-04-27 LAB — BODY FLUID CELL COUNT WITH DIFFERENTIAL
Eos, Fluid: 0 %
Lymphs, Fluid: 60 %
Monocyte-Macrophage-Serous Fluid: 16 % — ABNORMAL LOW (ref 50–90)
Neutrophil Count, Fluid: 24 % (ref 0–25)
Total Nucleated Cell Count, Fluid: 50 cu mm (ref 0–1000)

## 2023-04-27 MED ORDER — ALBUMIN HUMAN 25 % IV SOLN
50.0000 g | Freq: Once | INTRAVENOUS | Status: AC
  Administered 2023-04-27: 50 g via INTRAVENOUS

## 2023-04-27 MED ORDER — ALBUMIN HUMAN 25 % IV SOLN
INTRAVENOUS | Status: AC
  Filled 2023-04-27: qty 200

## 2023-04-27 NOTE — Progress Notes (Signed)
Patient tolerated right sided paracentesis procedure and 50G of IV albumin well today and 5 Liters of clear yellow ascites removed with labs collected and sent for processing. Patient verbalized understanding of discharge instructions and ambulatory with walker at departure with no acute distress noted.

## 2023-04-27 NOTE — Procedures (Signed)
PROCEDURE SUMMARY:  Successful image-guided paracentesis from the right lower abdomen.  Yielded 5 liters of pale yellow fluid.  No immediate complications.  EBL = trace. Patient tolerated well.   Specimen was sent for labs.  Please see imaging section of Epic for full dictation.  TIPSconsultation completed 03/18/2023 by Dr. Grace Isaac. Procedure pending echocardiogram.  Kennieth Francois PA-C 04/27/2023 1:31 PM

## 2023-04-28 ENCOUNTER — Telehealth: Payer: Self-pay | Admitting: *Deleted

## 2023-04-28 LAB — PATHOLOGIST SMEAR REVIEW

## 2023-04-28 LAB — CULTURE, BODY FLUID W GRAM STAIN -BOTTLE

## 2023-04-28 NOTE — Telephone Encounter (Signed)
Sending to you in absence of Leota Sauers lab called and states critical gram positive cocci and they are sending it to [facility] for further work up.

## 2023-04-28 NOTE — Telephone Encounter (Signed)
Thank you for letting me know.  Will follow culture results.  PMN reassuring. ?contaminant.  If patient has evidence of fever, worsening abdominal pain, she needs to proceed to the emergency room.  Thank you

## 2023-04-29 NOTE — Telephone Encounter (Signed)
Noted. Thank you for responding. Informed the pt.

## 2023-04-30 ENCOUNTER — Ambulatory Visit (HOSPITAL_COMMUNITY)
Admission: RE | Admit: 2023-04-30 | Discharge: 2023-04-30 | Disposition: A | Discharge status: Home / Self Care | Payer: Medicaid Other | Source: Ambulatory Visit | Attending: Gastroenterology | Admitting: Gastroenterology

## 2023-04-30 DIAGNOSIS — R188 Other ascites: Secondary | ICD-10-CM

## 2023-04-30 LAB — CULTURE, BODY FLUID W GRAM STAIN -BOTTLE

## 2023-04-30 LAB — BODY FLUID CELL COUNT WITH DIFFERENTIAL
Eos, Fluid: 0 %
Lymphs, Fluid: 39 %
Monocyte-Macrophage-Serous Fluid: 13 % — ABNORMAL LOW (ref 50–90)
Neutrophil Count, Fluid: 48 % — ABNORMAL HIGH (ref 0–25)
Total Nucleated Cell Count, Fluid: 204 cu mm (ref 0–1000)

## 2023-04-30 LAB — GRAM STAIN

## 2023-04-30 NOTE — Procedures (Signed)
PROCEDURE SUMMARY:  Successful ultrasound guided paracentesis from the right lower quadrant.  Yielded 3.6 of serosanguineous fluid. Procedure aborted at this amount due to symptomatic hypotension - small amount of residual fluid remains on post procedure US examination. No immediate complications.  The patient tolerated the procedure well.   Specimen was sent for labs.  EBL < 5mL  The patient has previously been formally evaluated by the [facility] and is being actively followed for potential future intervention. Patient seen by Dr. Grace Isaac 03/18/23 - per hist note, "- Obtain cardiac Echo to ensure adequate right-sided heart function - Once the cardiac echo is performed and deemed acceptable, we will schedule elective TIPS creation with potential variceal embolization and large-volume paracentesis at [facility] with general anesthesia.  The procedure will entail an overnight admission for continued observation and PCA usage." Patient scheduled for echo 05/14/23.  Lynnette Caffey, PA-C
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
## 2023-05-02 LAB — CULTURE, BODY FLUID W GRAM STAIN -BOTTLE

## 2023-05-02 NOTE — Telephone Encounter (Signed)
Culture with staph capitis. Likely contaminant as repeat paracentesis on 6/14 was negative.

## 2023-05-03 ENCOUNTER — Encounter: Payer: Self-pay | Admitting: Gastroenterology

## 2023-05-03 ENCOUNTER — Other Ambulatory Visit: Payer: Self-pay | Admitting: *Deleted

## 2023-05-03 ENCOUNTER — Ambulatory Visit (INDEPENDENT_AMBULATORY_CARE_PROVIDER_SITE_OTHER): Payer: Medicaid Other | Admitting: Gastroenterology

## 2023-05-03 ENCOUNTER — Telehealth: Payer: Self-pay | Admitting: *Deleted

## 2023-05-03 VITALS — BP 90/55 | HR 120 | Temp 98.9°F | Ht 64.0 in | Wt 114.0 lb

## 2023-05-03 DIAGNOSIS — D649 Anemia, unspecified: Secondary | ICD-10-CM

## 2023-05-03 DIAGNOSIS — K7031 Alcoholic cirrhosis of liver with ascites: Secondary | ICD-10-CM

## 2023-05-03 DIAGNOSIS — R634 Abnormal weight loss: Secondary | ICD-10-CM | POA: Diagnosis not present

## 2023-05-03 DIAGNOSIS — R188 Other ascites: Secondary | ICD-10-CM

## 2023-05-03 DIAGNOSIS — I959 Hypotension, unspecified: Secondary | ICD-10-CM | POA: Diagnosis not present

## 2023-05-03 DIAGNOSIS — R1310 Dysphagia, unspecified: Secondary | ICD-10-CM

## 2023-05-03 NOTE — Patient Instructions (Addendum)
Please have blood work completed at [facility].   Due to low blood pressure, you need to hold nadolol for now.  Please purchase a blood pressure cuff from a drugstore and monitor your blood pressure every day.  If your blood pressure remains below 90 on the top number, you need to proceed to the emergency room.   Please increase your water intake.  Drink enough water to keep your urine pale yellow to clear.  If you continue to feel poorly with symptoms of fatigue, lightheadedness, you need to proceed to the emergency room.   Depending on what your blood work shows, I will send in a liquid form of spironolactone.  Continue Lasix 40 mg twice daily for now.  As we discussed, it is very important that you follow a strict low-sodium diet.  No more than 2000 mg of sodium per day.  You should never use a saltshaker.  You may continue to hold Xifaxan for now as this medication was causing nausea and vomiting.  Continue your current dose of lactulose with a goal of 4 bowel movements daily.  Continue ursodiol.  Drink 2-3 ensure daily to help prevent further weight loss.   The number you asked me to write down was [phone].   Ermalinda Memos, PA-C [facility]

## 2023-05-03 NOTE — Telephone Encounter (Signed)
Spoke with pt and she is aware of dg esophagus appt details. She voiced understanding.

## 2023-05-04 ENCOUNTER — Encounter (HOSPITAL_COMMUNITY): Payer: Self-pay | Admitting: Emergency Medicine

## 2023-05-04 ENCOUNTER — Inpatient Hospital Stay (HOSPITAL_COMMUNITY)
Admission: EM | Admit: 2023-05-04 | Discharge: 2023-05-10 | DRG: 682 | Disposition: A | Discharge status: Other Acute Inpatient Hospital | Payer: Medicaid Other | Source: Ambulatory Visit | Attending: Internal Medicine | Admitting: Internal Medicine

## 2023-05-04 ENCOUNTER — Encounter: Payer: Self-pay | Admitting: Gastroenterology

## 2023-05-04 ENCOUNTER — Other Ambulatory Visit: Payer: Self-pay

## 2023-05-04 ENCOUNTER — Telehealth: Payer: Self-pay | Admitting: *Deleted

## 2023-05-04 ENCOUNTER — Other Ambulatory Visit (HOSPITAL_COMMUNITY)
Admission: RE | Admit: 2023-05-04 | Discharge: 2023-05-04 | Disposition: A | Discharge status: Home / Self Care | Payer: Medicaid Other | Source: Ambulatory Visit | Attending: Gastroenterology | Admitting: Gastroenterology

## 2023-05-04 DIAGNOSIS — E876 Hypokalemia: Secondary | ICD-10-CM | POA: Diagnosis present

## 2023-05-04 DIAGNOSIS — D270 Benign neoplasm of right ovary: Secondary | ICD-10-CM | POA: Diagnosis present

## 2023-05-04 DIAGNOSIS — Z91119 Patient's noncompliance with dietary regimen due to unspecified reason: Secondary | ICD-10-CM | POA: Diagnosis not present

## 2023-05-04 DIAGNOSIS — Z681 Body mass index (BMI) 19 or less, adult: Secondary | ICD-10-CM | POA: Diagnosis not present

## 2023-05-04 DIAGNOSIS — K219 Gastro-esophageal reflux disease without esophagitis: Secondary | ICD-10-CM | POA: Diagnosis present

## 2023-05-04 DIAGNOSIS — E871 Hypo-osmolality and hyponatremia: Secondary | ICD-10-CM | POA: Diagnosis present

## 2023-05-04 DIAGNOSIS — K767 Hepatorenal syndrome: Secondary | ICD-10-CM | POA: Diagnosis present

## 2023-05-04 DIAGNOSIS — Z3201 Encounter for pregnancy test, result positive: Secondary | ICD-10-CM | POA: Diagnosis present

## 2023-05-04 DIAGNOSIS — G894 Chronic pain syndrome: Secondary | ICD-10-CM | POA: Diagnosis present

## 2023-05-04 DIAGNOSIS — I959 Hypotension, unspecified: Secondary | ICD-10-CM | POA: Diagnosis present

## 2023-05-04 DIAGNOSIS — D649 Anemia, unspecified: Secondary | ICD-10-CM

## 2023-05-04 DIAGNOSIS — K7682 Hepatic encephalopathy: Secondary | ICD-10-CM | POA: Diagnosis present

## 2023-05-04 DIAGNOSIS — Z8249 Family history of ischemic heart disease and other diseases of the circulatory system: Secondary | ICD-10-CM

## 2023-05-04 DIAGNOSIS — I9589 Other hypotension: Secondary | ICD-10-CM | POA: Diagnosis not present

## 2023-05-04 DIAGNOSIS — R109 Unspecified abdominal pain: Secondary | ICD-10-CM | POA: Diagnosis present

## 2023-05-04 DIAGNOSIS — N186 End stage renal disease: Secondary | ICD-10-CM | POA: Diagnosis present

## 2023-05-04 DIAGNOSIS — D62 Acute posthemorrhagic anemia: Secondary | ICD-10-CM | POA: Diagnosis present

## 2023-05-04 DIAGNOSIS — N179 Acute kidney failure, unspecified: Secondary | ICD-10-CM | POA: Diagnosis present

## 2023-05-04 DIAGNOSIS — F5104 Psychophysiologic insomnia: Secondary | ICD-10-CM | POA: Diagnosis present

## 2023-05-04 DIAGNOSIS — K7031 Alcoholic cirrhosis of liver with ascites: Secondary | ICD-10-CM | POA: Diagnosis present

## 2023-05-04 DIAGNOSIS — K3189 Other diseases of stomach and duodenum: Secondary | ICD-10-CM | POA: Diagnosis present

## 2023-05-04 DIAGNOSIS — R278 Other lack of coordination: Secondary | ICD-10-CM | POA: Diagnosis not present

## 2023-05-04 DIAGNOSIS — R634 Abnormal weight loss: Secondary | ICD-10-CM | POA: Diagnosis present

## 2023-05-04 DIAGNOSIS — Z5982 Transportation insecurity: Secondary | ICD-10-CM

## 2023-05-04 DIAGNOSIS — F419 Anxiety disorder, unspecified: Secondary | ICD-10-CM | POA: Diagnosis present

## 2023-05-04 DIAGNOSIS — R1310 Dysphagia, unspecified: Secondary | ICD-10-CM | POA: Diagnosis present

## 2023-05-04 DIAGNOSIS — N920 Excessive and frequent menstruation with regular cycle: Secondary | ICD-10-CM | POA: Diagnosis present

## 2023-05-04 DIAGNOSIS — K7030 Alcoholic cirrhosis of liver without ascites: Secondary | ICD-10-CM | POA: Diagnosis present

## 2023-05-04 DIAGNOSIS — D631 Anemia in chronic kidney disease: Secondary | ICD-10-CM | POA: Diagnosis present

## 2023-05-04 DIAGNOSIS — R008 Other abnormalities of heart beat: Secondary | ICD-10-CM | POA: Diagnosis not present

## 2023-05-04 DIAGNOSIS — Z5941 Food insecurity: Secondary | ICD-10-CM

## 2023-05-04 DIAGNOSIS — F32A Depression, unspecified: Secondary | ICD-10-CM | POA: Diagnosis present

## 2023-05-04 DIAGNOSIS — K7460 Unspecified cirrhosis of liver: Secondary | ICD-10-CM | POA: Diagnosis not present

## 2023-05-04 DIAGNOSIS — F17210 Nicotine dependence, cigarettes, uncomplicated: Secondary | ICD-10-CM | POA: Diagnosis present

## 2023-05-04 DIAGNOSIS — E872 Acidosis, unspecified: Secondary | ICD-10-CM | POA: Diagnosis present

## 2023-05-04 DIAGNOSIS — K766 Portal hypertension: Secondary | ICD-10-CM | POA: Diagnosis present

## 2023-05-04 DIAGNOSIS — E8809 Other disorders of plasma-protein metabolism, not elsewhere classified: Secondary | ICD-10-CM | POA: Diagnosis present

## 2023-05-04 DIAGNOSIS — R112 Nausea with vomiting, unspecified: Secondary | ICD-10-CM | POA: Diagnosis present

## 2023-05-04 DIAGNOSIS — Z79899 Other long term (current) drug therapy: Secondary | ICD-10-CM

## 2023-05-04 LAB — COMPREHENSIVE METABOLIC PANEL
ALT: 26 U/L (ref 0–44)
ALT: 25 U/L (ref 0–44)
AST: 47 U/L — ABNORMAL HIGH (ref 15–41)
AST: 44 U/L — ABNORMAL HIGH (ref 15–41)
Albumin: 2.7 g/dL — ABNORMAL LOW (ref 3.5–5.0)
Albumin: 2.7 g/dL — ABNORMAL LOW (ref 3.5–5.0)
Alkaline Phosphatase: 122 U/L (ref 38–126)
Alkaline Phosphatase: 117 U/L (ref 38–126)
Anion gap: 13 (ref 5–15)
Anion gap: 13 (ref 5–15)
BUN: 42 mg/dL — ABNORMAL HIGH (ref 6–20)
BUN: 43 mg/dL — ABNORMAL HIGH (ref 6–20)
CO2: 11 mmol/L — ABNORMAL LOW (ref 22–32)
CO2: 10 mmol/L — ABNORMAL LOW (ref 22–32)
Calcium: 7.8 mg/dL — ABNORMAL LOW (ref 8.9–10.3)
Calcium: 7.5 mg/dL — ABNORMAL LOW (ref 8.9–10.3)
Chloride: 105 mmol/L (ref 98–111)
Chloride: 102 mmol/L (ref 98–111)
Creatinine, Ser: 5.2 mg/dL — ABNORMAL HIGH (ref 0.44–1.00)
Creatinine, Ser: 5.17 mg/dL — ABNORMAL HIGH (ref 0.44–1.00)
GFR, Estimated: 10 mL/min — ABNORMAL LOW (ref >60)
GFR, Estimated: 10 mL/min — ABNORMAL LOW (ref >60)
Glucose, Bld: 113 mg/dL — ABNORMAL HIGH (ref 70–99)
Glucose, Bld: 129 mg/dL — ABNORMAL HIGH (ref 70–99)
Potassium: 3.1 mmol/L — ABNORMAL LOW (ref 3.5–5.1)
Potassium: 3.1 mmol/L — ABNORMAL LOW (ref 3.5–5.1)
Sodium: 129 mmol/L — ABNORMAL LOW (ref 135–145)
Sodium: 125 mmol/L — ABNORMAL LOW (ref 135–145)
Total Bilirubin: 1 mg/dL (ref 0.3–1.2)
Total Bilirubin: 0.9 mg/dL (ref 0.3–1.2)
Total Protein: 5.8 g/dL — ABNORMAL LOW (ref 6.5–8.1)
Total Protein: 5.8 g/dL — ABNORMAL LOW (ref 6.5–8.1)

## 2023-05-04 LAB — PROTIME-INR
INR: 1.7 — ABNORMAL HIGH (ref 0.8–1.2)
Prothrombin Time: 19.8 seconds — ABNORMAL HIGH (ref 11.4–15.2)

## 2023-05-04 LAB — URINALYSIS, ROUTINE W REFLEX MICROSCOPIC
Bilirubin Urine: NEGATIVE
Glucose, UA: NEGATIVE mg/dL
Ketones, ur: NEGATIVE mg/dL
Nitrite: NEGATIVE
Protein, ur: 100 mg/dL — AB
Specific Gravity, Urine: 1.016 (ref 1.005–1.030)
WBC, UA: >50 WBC/hpf (ref 0–5)
pH: 5 (ref 5.0–8.0)

## 2023-05-04 LAB — CBC WITH DIFFERENTIAL/PLATELET
Abs Immature Granulocytes: 0.04 10*3/uL (ref 0.00–0.07)
Basophils Absolute: 0 10*3/uL (ref 0.0–0.1)
Basophils Relative: 0 %
Eosinophils Absolute: 0.2 10*3/uL (ref 0.0–0.5)
Eosinophils Relative: 2 %
HCT: 18.2 % — ABNORMAL LOW (ref 36.0–46.0)
Hemoglobin: 6.3 g/dL — CL (ref 12.0–15.0)
Immature Granulocytes: 0 %
Lymphocytes Relative: 16 %
Lymphs Abs: 2 10*3/uL (ref 0.7–4.0)
MCH: 29.4 pg (ref 26.0–34.0)
MCHC: 34.6 g/dL (ref 30.0–36.0)
MCV: 85 fL (ref 80.0–100.0)
Monocytes Absolute: 1.2 10*3/uL — ABNORMAL HIGH (ref 0.1–1.0)
Monocytes Relative: 9 %
Neutro Abs: 8.9 10*3/uL — ABNORMAL HIGH (ref 1.7–7.7)
Neutrophils Relative %: 73 %
Platelets: 247 10*3/uL (ref 150–400)
RBC: 2.14 MIL/uL — ABNORMAL LOW (ref 3.87–5.11)
RDW: 14.7 % (ref 11.5–15.5)
WBC: 12.4 10*3/uL — ABNORMAL HIGH (ref 4.0–10.5)
nRBC: 0 % (ref 0.0–0.2)

## 2023-05-04 LAB — TYPE AND SCREEN: Unit division: 0

## 2023-05-04 LAB — CBC
HCT: 17.6 % — ABNORMAL LOW (ref 36.0–46.0)
Hemoglobin: 5.8 g/dL — CL (ref 12.0–15.0)
MCH: 29 pg (ref 26.0–34.0)
MCHC: 33 g/dL (ref 30.0–36.0)
MCV: 88 fL (ref 80.0–100.0)
Platelets: 244 10*3/uL (ref 150–400)
RBC: 2 MIL/uL — ABNORMAL LOW (ref 3.87–5.11)
RDW: 14.9 % (ref 11.5–15.5)
WBC: 13 10*3/uL — ABNORMAL HIGH (ref 4.0–10.5)
nRBC: 0 % (ref 0.0–0.2)

## 2023-05-04 LAB — LIPASE, BLOOD: Lipase: 51 U/L (ref 11–51)

## 2023-05-04 LAB — CREATININE, URINE, RANDOM: Creatinine, Urine: 249 mg/dL

## 2023-05-04 LAB — HEMOGLOBIN AND HEMATOCRIT, BLOOD
HCT: 19.1 % — ABNORMAL LOW (ref 36.0–46.0)
Hemoglobin: 6.6 g/dL — CL (ref 12.0–15.0)

## 2023-05-04 LAB — AMMONIA: Ammonia: 76 umol/L — ABNORMAL HIGH (ref 9–35)

## 2023-05-04 LAB — PREPARE RBC (CROSSMATCH)

## 2023-05-04 LAB — POC OCCULT BLOOD, ED: Fecal Occult Blood: NEGATIVE

## 2023-05-04 LAB — SODIUM, URINE, RANDOM: Sodium, Ur: <10 mmol/L

## 2023-05-04 LAB — BPAM RBC

## 2023-05-04 LAB — MRSA NEXT GEN BY PCR, NASAL: MRSA by PCR Next Gen: NOT DETECTED

## 2023-05-04 MED ORDER — NICOTINE 14 MG/24HR TD PT24
14.0000 mg | MEDICATED_PATCH | Freq: Once | TRANSDERMAL | Status: AC
  Administered 2023-05-04: 14 mg via TRANSDERMAL
  Filled 2023-05-04: qty 1

## 2023-05-04 MED ORDER — PROCHLORPERAZINE EDISYLATE 10 MG/2ML IJ SOLN
10.0000 mg | Freq: Four times a day (QID) | INTRAMUSCULAR | Status: DC | PRN
  Administered 2023-05-05 – 2023-05-10 (×7): 10 mg via INTRAVENOUS
  Filled 2023-05-04 (×7): qty 2

## 2023-05-04 MED ORDER — SODIUM BICARBONATE 650 MG PO TABS
650.0000 mg | ORAL_TABLET | Freq: Three times a day (TID) | ORAL | Status: DC
  Administered 2023-05-04 – 2023-05-06 (×5): 650 mg via ORAL
  Filled 2023-05-04 (×5): qty 1

## 2023-05-04 MED ORDER — ACETAMINOPHEN 650 MG RE SUPP: 650.0000 mg | Freq: Four times a day (QID) | RECTAL | Status: DC | PRN

## 2023-05-04 MED ORDER — SODIUM CHLORIDE 0.9% IV SOLUTION: Freq: Once | INTRAVENOUS | Status: DC

## 2023-05-04 MED ORDER — DIPHENHYDRAMINE HCL 25 MG PO CAPS: 25.0000 mg | ORAL_CAPSULE | Freq: Every evening | ORAL | Status: DC | PRN

## 2023-05-04 MED ORDER — HYDROXYZINE HCL 25 MG PO TABS
25.0000 mg | ORAL_TABLET | Freq: Four times a day (QID) | ORAL | Status: DC | PRN
  Administered 2023-05-04: 25 mg via ORAL
  Filled 2023-05-04: qty 1

## 2023-05-04 MED ORDER — MIRTAZAPINE 15 MG PO TABS
7.5000 mg | ORAL_TABLET | Freq: Every day | ORAL | Status: DC
  Administered 2023-05-04 – 2023-05-08 (×5): 7.5 mg via ORAL
  Filled 2023-05-04 (×5): qty 1

## 2023-05-04 MED ORDER — ONDANSETRON 8 MG PO TBDP: 8.0000 mg | ORAL_TABLET | Freq: Three times a day (TID) | ORAL | Status: DC | PRN

## 2023-05-04 MED ORDER — PANTOPRAZOLE SODIUM 40 MG PO TBEC
40.0000 mg | DELAYED_RELEASE_TABLET | Freq: Two times a day (BID) | ORAL | Status: DC
  Administered 2023-05-04 – 2023-05-10 (×12): 40 mg via ORAL
  Filled 2023-05-04 (×12): qty 1

## 2023-05-04 MED ORDER — ONDANSETRON HCL 4 MG PO TABS: 4.0000 mg | ORAL_TABLET | Freq: Four times a day (QID) | ORAL | Status: DC | PRN

## 2023-05-04 MED ORDER — ALBUMIN HUMAN 25 % IV SOLN
50.0000 g | Freq: Three times a day (TID) | INTRAVENOUS | Status: AC
  Administered 2023-05-05 (×2): 50 g via INTRAVENOUS
  Filled 2023-05-04 (×2): qty 200

## 2023-05-04 MED ORDER — POTASSIUM CHLORIDE CRYS ER 10 MEQ PO TBCR
10.0000 meq | EXTENDED_RELEASE_TABLET | Freq: Every day | ORAL | Status: DC
  Administered 2023-05-04: 10 meq via ORAL
  Filled 2023-05-04 (×3): qty 1

## 2023-05-04 MED ORDER — ACETAMINOPHEN 325 MG PO TABS: 650.0000 mg | ORAL_TABLET | Freq: Four times a day (QID) | ORAL | Status: DC | PRN

## 2023-05-04 MED ORDER — ONDANSETRON HCL 4 MG/2ML IJ SOLN
4.0000 mg | Freq: Four times a day (QID) | INTRAMUSCULAR | Status: DC | PRN
  Administered 2023-05-04 – 2023-05-10 (×8): 4 mg via INTRAVENOUS
  Filled 2023-05-04 (×10): qty 2

## 2023-05-04 MED ORDER — MIDODRINE HCL 5 MG PO TABS
5.0000 mg | ORAL_TABLET | Freq: Three times a day (TID) | ORAL | Status: DC
  Administered 2023-05-04 – 2023-05-06 (×5): 5 mg via ORAL
  Filled 2023-05-04 (×5): qty 1

## 2023-05-04 MED ORDER — URSODIOL 300 MG PO CAPS
300.0000 mg | ORAL_CAPSULE | Freq: Three times a day (TID) | ORAL | Status: DC
  Administered 2023-05-04 – 2023-05-10 (×18): 300 mg via ORAL
  Filled 2023-05-04 (×23): qty 1

## 2023-05-04 MED ORDER — ONDANSETRON HCL 4 MG/2ML IJ SOLN
4.0000 mg | Freq: Once | INTRAMUSCULAR | Status: AC | PRN
  Administered 2023-05-04: 4 mg via INTRAVENOUS

## 2023-05-04 MED ORDER — RIFAXIMIN 550 MG PO TABS
550.0000 mg | ORAL_TABLET | Freq: Two times a day (BID) | ORAL | Status: DC
  Administered 2023-05-04 – 2023-05-10 (×11): 550 mg via ORAL
  Filled 2023-05-04 (×12): qty 1

## 2023-05-04 MED ORDER — NADOLOL 20 MG PO TABS
20.0000 mg | ORAL_TABLET | Freq: Every day | ORAL | Status: DC
  Filled 2023-05-04: qty 1

## 2023-05-04 MED ORDER — DIPHENHYDRAMINE HCL 25 MG PO CAPS: 25.0000 mg | ORAL_CAPSULE | Freq: Four times a day (QID) | ORAL | Status: DC | PRN

## 2023-05-04 MED ORDER — LACTULOSE 10 GM/15ML PO SOLN
30.0000 g | Freq: Two times a day (BID) | ORAL | Status: DC
  Administered 2023-05-04 – 2023-05-07 (×6): 30 g via ORAL
  Filled 2023-05-04 (×6): qty 60

## 2023-05-04 MED ORDER — FENTANYL CITRATE PF 50 MCG/ML IJ SOSY
50.0000 ug | PREFILLED_SYRINGE | Freq: Once | INTRAMUSCULAR | Status: AC
  Administered 2023-05-04: 50 ug via INTRAVENOUS
  Filled 2023-05-04: qty 1

## 2023-05-04 MED ORDER — ALBUMIN HUMAN 25 % IV SOLN
50.0000 g | Freq: Three times a day (TID) | INTRAVENOUS | Status: DC
  Administered 2023-05-04: 50 g via INTRAVENOUS
  Filled 2023-05-04: qty 200

## 2023-05-04 MED ORDER — ENSURE ENLIVE PO LIQD
237.0000 mL | Freq: Two times a day (BID) | ORAL | Status: DC
  Administered 2023-05-05 – 2023-05-10 (×6): 237 mL via ORAL

## 2023-05-04 MED ORDER — HYDROMORPHONE HCL 1 MG/ML IJ SOLN
0.5000 mg | INTRAMUSCULAR | Status: DC | PRN
  Administered 2023-05-04 – 2023-05-06 (×9): 1 mg via INTRAVENOUS
  Filled 2023-05-04 (×10): qty 1

## 2023-05-04 MED ORDER — FOLIC ACID 1 MG PO TABS
1.0000 mg | ORAL_TABLET | Freq: Every day | ORAL | Status: DC
  Administered 2023-05-05 – 2023-05-10 (×6): 1 mg via ORAL
  Filled 2023-05-04 (×6): qty 1
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
## 2023-05-04 NOTE — Progress Notes (Addendum)
Subjective: Feeling a little worse today. Sluggish and vomited this morning not sure why. Hasn't slept. Chronic insomnia. No overt GI bleeding. Intermittent low volume vaginal bleeding. Chronic LLQ abdominal pain, overall about the same, maybe slightly worse. No tense ascites at this time. Did not take nadolol this morning. Only took Zofran and vitamins. GERD remains well controlled. Has always had trouble swallowing pills dating back to childhood.   Objective: Vital signs in last 24 hours: Temp:  [97.9 F (36.6 C)-98.2 F (36.8 C)] 98.2 F (36.8 C) (06/18 1610) Pulse Rate:  [105-114] 107 (06/18 1611) Resp:  [14-22] 16 (06/18 1611) BP: (97-113)/(55-63) 106/55 (06/18 1610) SpO2:  [100 %] 100 % (06/18 1611) Weight:  [51.7 kg] 51.7 kg (06/18 1133) Last BM Date : 05/04/23 General:   Alert and oriented, pleasant, NAD.  Head:  Normocephalic and atraumatic. Eyes:  No icterus, sclera clear. Conjuctiva pink.  Heart:  S1, S2 present, no murmurs noted.  Lungs: Clear to auscultation bilaterally, without wheezing, rales, or rhonchi.  Abdomen:  Bowel sounds present, soft,  mild distension but non tense. Mild TTP in LLQ. No rebound or guarding. No masses appreciated  Msk:  Symmetrical without gross deformities. Normal posture. Extremities:  Without edema. Neurologic:  Alert and  oriented x4;  grossly normal neurologically. Slight tremor but no asterixis.  Skin:  Warm and dry, intact without significant lesions.  Psych:   Normal mood and affect.  Intake/Output from previous day: No intake/output data recorded. Intake/Output this shift: No intake/output data recorded.  Lab Results: Recent Labs    05/04/23 0837 05/04/23 1217  WBC 12.4* 13.0*  HGB 6.3* 5.8*  HCT 18.2* 17.6*  PLT 247 244   BMET Recent Labs    05/04/23 0837 05/04/23 1217  NA 129* 125*  K 3.1* 3.1*  CL 105 102  CO2 11* 10*  GLUCOSE 113* 129*  BUN 42* 43*  CREATININE 5.20* 5.17*  CALCIUM 7.8* 7.5*   LFT Recent  Labs    05/04/23 0837 05/04/23 1217  PROT 5.8* 5.8*  ALBUMIN 2.7* 2.7*  AST 47* 44*  ALT 26 25  ALKPHOS 122 117  BILITOT 1.0 0.9   PT/INR Recent Labs    05/04/23 0837  LABPROT 19.8*  INR 1.7*     Assessment: 42 year old female with history of anxiety, depression, decompensated alcoholic cirrhosis, PBC,  alcoholic hepatitis December 2023, hepatic encephalopathy, recurrent ascites requiring frequent paracentesis, chronic anemia with recurrent hospitalzations due to acute on chronic anemia with most recent admission in May 2024 with acute on chronic anemia and AKI, now returning to the ER due to outpatient labs again with acute on chronic anemia and worsening AKI.   Acute on chronic anemia:  Chronic history of anemia with frequent hospitalizations in the setting of acute on chronic anemia.  Most recent admission 04/05/2023 with hemoglobin declining to 7, no evidence of iron, B12, or folate deficiency.  She received 1 unit PRBCs with hemoglobin improved to 8.1.  Repeat labs today outpatient with hemoglobin down to 6.3.  Hemoglobin 5.8 in the emergency room.  She denies overt GI bleeding.  FOBT was negative in the ED.  She does report intermittent low-volume vaginal bleeding, but overall prior heavy bleeding has resolved.    Etiology of acute on chronic anemia is not entirely clear.  Could have oozing from known portal hypertensive gastropathy.  She has never had a colonoscopy, thus unable to rule out colonic etiology.  Recommend repeat EGD and completing first-ever colonoscopy prior  to discharge, once AKI has improved.  For now, agree with supportive measures including blood transfusion and PPI twice daily.  AKI/?Hepatorenal syndrome:  Cr previously normal, bumped up to 2 range on 5/20 and now 5.17. Likely multifactorial in the setting of hypotension outpatient (nadolol placed on hold yesterday), frequent paracentesis, diuretics, frequent bowel movements related to lactulose, and recent  nausea and vomiting.  Diuretics currently on hold.  Starting IV albumin every 8 hours x 3 doses and midodrine 5 mg 3 times daily.  Nephrology has been consulted.  Appreciate their assistance.  N/V:  Etiology unclear.  Patient initially reported this was secondary to intolerance to the taste of medications when splitting/crushing her medicines with resolution after discontinuing spironolactone and Xifaxan.  However, she had an episode of vomiting this morning.  This may be related to worsening AKI.  She does have LLQ abdominal pain, but this is chronic without significant change and less likely to be the etiology of her nausea/vomiting.  No SBP on recent paracentesis 04/30/2023.  If worsening abdominal pain, will need to consider diagnostic paracentesis and/or repeating CT abdomen pelvis without contrast.  Weight loss: Down 13 pounds in the last month. Likely influenced by decreased p.o. intake in the setting of recurrent ascites as well as nausea/vomiting related to inability to tolerate the taste of spironolactone or Xifaxan when splitting her pills. Recent EGD April 2024 severe portal gastropathy. No prior colonoscopy.   Decompensated Cirrhosis:  Secondary to ETOH and PBC. Abstinent from alcohol since November 2023. Cirrhosis has been complicated by recurrent ascites, HE previously, and coagulopathy with elevated INR. MELD 3.0 is 34 today, up from 16 in April. Currently undergoing TIPS evaluation. She has been referred to [facility] in [place] for  transplant evaluation, but has not scheduled an appointment.   She has had recurrent ascites in the setting of non compliance with 2g sodium diet, hypoalbuminemia. She is on Lasix 40 mg BID only, stopped spironolactone about 2 weeks ago due to inability to tolerate taste with splitting the pill. She has been undergoing frequent paracentesis, up to to biweekly recently. No true history of SBP. She has had a couple paras with suspected contamination.  Last para 6/14 with PMN <250, gram stain and culture negative. She has evidence of ascites on exam, but non-tense, so will hold off on repeat para in the setting of AKI. Diuretics also on hold.   Regarding history of HE, her ammonia is mildly elevated today at 76, but no clinical encephalopathy.  No asterixis.  She does have a chronic mild tremor.  Currently taking lactulose only with about 5 bowel movements per day.  Stop Xifaxan about 2 weeks ago due to not tolerating the taste when cutting/crushing the pill. We will continue lactulose and see how she does with Xifaxan here.   No history of esophageal varices.  EGD April 2024 with severe portal gastropathy with touch friability/oozing.  She was started on nadolol at that time.  However, nadolol was placed on hold yesterday due to hypotension.  Can consider resuming this once AKI has improved and she has good blood pressure stability.    Plan: Agree with transfusing 2 units PRBCs.  Protonix BID Consider EGD and colonosocpy this admission pending improvement in kidney function.  Continue to monitor H/H and transfuse additional PRBCs as needed.  Albumin 50 g every 8 hours x3 doses Midodrine 5mg  TID Diuretics on hold for now. Duw to AKI.  Nadolol on hold for now due to AKI/outpatient  hypotension.  Agree with nephrology consultation.  Hold off on paracentesis due to AKI unless tense ascites or concerns for SBP .  2g sodium diet.  Continue Lactulose.  We can try giving xifaxan while she is here and see how she tolerates it.   Zofran 4 mg q6 hours prn.  Monitor for worsening of chronic abdominal pain.  Daily MELD labs Needs to arrange outpatient appointment with [facility] in [place] to start transplant evaluation.    LOS: 0 days    05/04/2023, 4:58 PM   Ermalinda Memos, [facility]

## 2023-05-04 NOTE — Telephone Encounter (Signed)
Mallory Mendoza lab called with a critical hemoglobin at 6.3.

## 2023-05-04 NOTE — Telephone Encounter (Signed)
Spoke to pt, informed her to go to ED now, because her hemoglobin is low. She states she is going.

## 2023-05-04 NOTE — ED Triage Notes (Signed)
Pt via POV sent from GI for abdominal pain and cirrhosis, concerned for developing renal failure. LLQ pain rated 9/10 and stabbing radiating around to back. PMH includes cirrhosis, renal disease.

## 2023-05-04 NOTE — ED Provider Notes (Signed)
[place] EMERGENCY DEPARTMENT AT [facility] Provider Note   CSN: [id number] Arrival date & time: 05/04/23  1017     History  Chief Complaint  Patient presents with   Abdominal Pain    Mallory Mendoza is a 42 y.o. female with a past medical history of anxiety, depression, decompensated alcoholic cirrhosis, chronic anemia, history of hepatic encephalopathy who was sent in by her GI specialist for worsening renal function.  She gets paracentesis twice weekly.  She reports being tired and cold all the time.  She has been referred for both a TIPS evaluation and an echocardiogram.  She denies any swelling in her lower extremities or black stool.  She states she frequently has loose stools due to her lactulose use. Patient states she is supposed to take spironolactone but has not been taking it because it makes her throw up and she is has trouble taking pills.  Abdominal Pain      Home Medications Prior to Admission medications   Medication Sig Start Date End Date Taking? Authorizing Provider  folic acid (FOLVITE) 1 MG tablet Take 1 tablet (1 mg total) by mouth daily. 04/06/23   Shon Hale, MD  furosemide (LASIX) 40 MG tablet Take 1 tablet (40 mg total) by mouth 2 (two) times daily. 04/06/23 07/05/23  Shon Hale, MD  gabapentin (NEURONTIN) 100 MG capsule Take 100 mg by mouth 2 (two) times daily. 12/02/22   [provider]  hydrOXYzine (ATARAX) 25 MG tablet Take 1 tablet (25 mg total) by mouth every 6 (six) hours as needed for itching. 03/12/23   Narda Bonds, MD  lactulose (CHRONULAC) 10 GM/15ML solution Take 45 mLs (30 g total) by mouth 2 (two) times daily. 04/06/23   Shon Hale, MD  mirtazapine (REMERON) 7.5 MG tablet Take 1 tablet (7.5 mg total) by mouth at bedtime. 04/06/23   Shon Hale, MD  nadolol (CORGARD) 20 MG tablet Take 1 tablet (20 mg total) by mouth daily. 04/06/23 04/05/24  Shon Hale, MD  ondansetron (ZOFRAN-ODT) 8 MG  disintegrating tablet 8 mg every 8 (eight) hours as needed. 04/28/23   [provider]  pantoprazole (PROTONIX) 40 MG tablet Take 1 tablet (40 mg total) by mouth 2 (two) times daily. 04/06/23 04/05/24  Shon Hale, MD  potassium chloride (KLOR-CON) 10 MEQ tablet Take 1 tablet (10 mEq total) by mouth daily. Take While taking Lasix/furosemide 04/06/23   Shon Hale, MD  rifaximin (XIFAXAN) 550 MG TABS tablet Take 1 tablet (550 mg total) by mouth 2 (two) times daily. Patient not taking: Reported on 05/03/2023 04/06/23   Shon Hale, MD  sodium bicarbonate 650 MG tablet Take 1 tablet (650 mg total) by mouth 3 (three) times daily. 04/06/23   Shon Hale, MD  spironolactone (ALDACTONE) 100 MG tablet Take 1 tablet (100 mg total) by mouth daily. Patient not taking: Reported on 05/03/2023 04/06/23 04/05/24  Shon Hale, MD  ursodiol (ACTIGALL) 300 MG capsule Take 1 capsule (300 mg total) by mouth 3 (three) times daily. 04/06/23 07/05/23  Shon Hale, MD      Allergies    Patient has no known allergies.    Review of Systems   Review of Systems  Gastrointestinal:  Positive for abdominal pain.    Physical Exam Updated Vital Signs BP (!) 107/58   Pulse (!) 114   Temp 98 F (36.7 C) (Oral)   Resp 14   Ht 5\' 4"  (1.626 m)   Wt 51.7 kg   LMP 04/26/2023 (  Approximate)   SpO2 100%   BMI 19.57 kg/m  Physical Exam Vitals and nursing note reviewed.  Constitutional:      General: She is not in acute distress.    Appearance: She is well-developed. She is not diaphoretic.  HENT:     Head: Normocephalic and atraumatic.     Right Ear: External ear normal.     Left Ear: External ear normal.     Nose: Nose normal.     Mouth/Throat:     Mouth: Mucous membranes are moist.  Eyes:     General: Scleral icterus present.     Conjunctiva/sclera: Conjunctivae normal.  Cardiovascular:     Rate and Rhythm: Normal rate and regular rhythm.     Heart sounds: Normal heart sounds. No  murmur heard.    No friction rub. No gallop.  Pulmonary:     Effort: Pulmonary effort is normal. No respiratory distress.     Breath sounds: Normal breath sounds.  Abdominal:     General: Bowel sounds are normal. There is distension.     Palpations: Abdomen is soft. There is no mass.     Tenderness: There is no abdominal tenderness. There is no guarding.  Musculoskeletal:     Cervical back: Normal range of motion.  Skin:    General: Skin is warm and dry.  Neurological:     Mental Status: She is alert and oriented to person, place, and time.  Psychiatric:        Behavior: Behavior normal.     ED Results / Procedures / Treatments   Labs (all labs ordered are listed, but only abnormal results are displayed) Labs Reviewed  COMPREHENSIVE METABOLIC PANEL - Abnormal; Notable for the following components:      Result Value   Sodium 125 (*)    Potassium 3.1 (*)    CO2 10 (*)    Glucose, Bld 129 (*)    BUN 43 (*)    Creatinine, Ser 5.17 (*)    Calcium 7.5 (*)    Total Protein 5.8 (*)    Albumin 2.7 (*)    AST 44 (*)    GFR, Estimated 10 (*)    All other components within normal limits  CBC - Abnormal; Notable for the following components:   WBC 13.0 (*)    RBC 2.00 (*)    Hemoglobin 5.8 (*)    HCT 17.6 (*)    All other components within normal limits  AMMONIA - Abnormal; Notable for the following components:   Ammonia 76 (*)    All other components within normal limits  LIPASE, BLOOD  URINALYSIS, ROUTINE W REFLEX MICROSCOPIC  POC URINE PREG, ED  TYPE AND SCREEN    EKG None  Radiology No results found.  Procedures .Critical Care  Performed by: Arthor Captain, PA-C Authorized by: Arthor Captain, PA-C   Critical care provider statement:    Critical care time (minutes):  60   Critical care was necessary to treat or prevent imminent or life-threatening deterioration of the following conditions:  Circulatory failure, renal failure and hepatic failure   Critical  care was time spent personally by me on the following activities:  Development of treatment plan with patient or surrogate, discussions with consultants, evaluation of patient's response to treatment, examination of patient, ordering and review of laboratory studies, ordering and review of radiographic studies, ordering and performing treatments and interventions, pulse oximetry, re-evaluation of patient's condition and review of old charts   Care discussed with:  admitting provider       Medications Ordered in ED Medications  ondansetron (ZOFRAN) injection 4 mg (has no administration in time range)    ED Course/ Medical Decision Making/ A&P Clinical Course as of 05/04/23 1436  Tue May 04, 2023  1433 Sodium(!): 125 Case discussed with Dr. Kathrene Bongo of nephrology.  She states that patient can be seen at [facility] if she does not need emergent dialysis she does not have to be transferred to [facility].  She also recommends holding her Aldactone.  Patient states she has not been taking this medication anyway. [AH]    Clinical Course User Index [AH] Arthor Captain, PA-C                             Medical Decision Making Amount and/or Complexity of Data Reviewed Labs: ordered.  Risk Prescription drug management.   Is a 42 year old female who presents emergency department for known end-stage hepatic cirrhosis and new renal failure.  She is also found to be profoundly anemic with a hemoglobin of 5.8.  Differential diagnosis includes ACD due to renal insufficiency, GI bleed, B12 insufficiency, folate insufficiency.   Co morbidities:  end-stage alcoholic cirrhosis  Social Determinants of Health:   SDOH Screenings   Food Insecurity: Food Insecurity Present (04/05/2023)  Housing: Medium Risk (04/05/2023)  Transportation Needs: Unmet Transportation Needs (04/05/2023)  Utilities: At Risk (04/05/2023)  Alcohol Screen: Medium Risk (03/11/2021)  Depression (PHQ2-9): Medium Risk (03/11/2021)   Financial Resource Strain: Medium Risk (03/11/2021)  Physical Activity: Insufficiently Active (03/11/2021)  Social Connections: Moderately Integrated (03/11/2021)  Stress: Stress Concern Present (03/11/2021)  Tobacco Use: High Risk (05/04/2023)     Additional history:  {Additional history obtained from patient at bedside {External records from outside source obtained and reviewed including outpatient GI notes  Lab Tests:  I Ordered, and personally interpreted labs.  The pertinent results include:    Hemoglobin  Date Value Ref Range Status  05/04/2023 5.8 (LL) 12.0 - 15.0 g/dL Final    Comment:    REPEATED TO VERIFY THIS CRITICAL RESULT HAS VERIFIED AND BEEN CALLED TO TALBOTT, TINA BY ASHLEY FRATTO ON 06 18 2024 AT 1232, AND HAS BEEN READ BACK.    05/04/2023 6.3 (LL) 12.0 - 15.0 g/dL Final    Comment:    REPEATED TO VERIFY THIS CRITICAL RESULT HAS VERIFIED AND BEEN CALLED TO SANCHEZ, COURTNEY BY ASHLEY FRATTO ON 06 18 2024 AT 0914, AND HAS BEEN READ BACK.    04/06/2023 8.1 (L) 12.0 - 15.0 g/dL Final  27/01/5008 7.9 (L) 12.0 - 15.0 g/dL Final   ALT  Date Value Ref Range Status  05/04/2023 25 0 - 44 U/L Final  05/04/2023 26 0 - 44 U/L Final  04/05/2023 13 0 - 44 U/L Final  03/19/2023 16 0 - 44 U/L Final    AST  Date Value Ref Range Status  05/04/2023 44 (H) 15 - 41 U/L Final  05/04/2023 47 (H) 15 - 41 U/L Final  04/05/2023 29 15 - 41 U/L Final  03/19/2023 32 15 - 41 U/L Final   BUN  Date Value Ref Range Status  05/04/2023 43 (H) 6 - 20 mg/dL Final  38/18/2993 42 (H) 6 - 20 mg/dL Final  71/69/6789 41 (H) 6 - 20 mg/dL Final  38/08/1750 43 (H) 6 - 20 mg/dL Final   Creat  Date Value Ref Range Status  08/12/2020 0.59 0.50 - 1.10 mg/dL Final  Creatinine, Ser  Date Value Ref Range Status  05/04/2023 5.17 (H) 0.44 - 1.00 mg/dL Final  16/08/9603 5.40 (H) 0.44 - 1.00 mg/dL Final  98/09/9146 8.29 (H) 0.44 - 1.00 mg/dL Final  56/21/3086 5.78 (H) 0.44 - 1.00 mg/dL Final    Sodium  Date Value Ref Range Status  05/04/2023 125 (L) 135 - 145 mmol/L Final  05/04/2023 129 (L) 135 - 145 mmol/L Final  04/06/2023 131 (L) 135 - 145 mmol/L Final  04/05/2023 128 (L) 135 - 145 mmol/L Final     Imaging Studies: N/a  Cardiac Monitoring/ECG:  The patient was maintained on a cardiac monitor.  I personally viewed and interpreted the cardiac monitored which showed an underlying rhythm of:nsr  Medicines ordered and prescription drug management:  I ordered medication including  Medications  ondansetron (ZOFRAN) injection 4 mg (has no administration in time range)  0.9 %  sodium chloride infusion (Manually program via Guardrails IV Fluids) (0 mLs Intravenous Hold 05/04/23 1436)  fentaNYL (SUBLIMAZE) injection 50 mcg (50 mcg Intravenous Given 05/04/23 1416)   for anemia and abd pain Reevaluation of the patient after these medicines showed that the patient improved I have reviewed the patients home medicines and have made adjustments as needed  Test Considered:   did not feel the patient needed abdominal fluid analysis for SBP if she is not having any significant redness heat or tenderness.  Critical Interventions:   2 units of blood for transfusion  Consultations Obtained: Case discussed with both Dr. Kathrene Bongo of the renal service as well as Dr. Levon Hedger who is on for gastroenterology  Problem List / ED Course:     ICD-10-CM   1. Hepatorenal failure (HCC)  K76.7     2. Anemia, unspecified type  D64.9     3. Hyponatremia  E87.1       MDM: 42 year old female with end-stage renal disease due to alcoholic cirrhosis, worsening renal failure, and also profound anemia likely all related to hepatorenal syndrome.  She does not appear to have acute GI bleed.  Case discussed with Dr. Sherryll Burger of the hospitalist service who will admit the patient.   Dispostion:  After consideration of the diagnostic results and the patients response to treatment, I feel that  the patent would benefit from admission.  Final Clinical Impression(s) / ED Diagnoses Final diagnoses:  None    Rx / DC Orders ED Discharge Orders     None         Arthor Captain, PA-C 05/04/23 1501    Gerhard Munch, MD 05/05/23 [phone]

## 2023-05-04 NOTE — Telephone Encounter (Signed)
Please call patient and let her know hemoglobin is 6.3. She needs to proceed to the emergency room at this time.

## 2023-05-05 ENCOUNTER — Inpatient Hospital Stay (HOSPITAL_COMMUNITY): Payer: Medicaid Other

## 2023-05-05 DIAGNOSIS — E876 Hypokalemia: Secondary | ICD-10-CM

## 2023-05-05 DIAGNOSIS — R634 Abnormal weight loss: Secondary | ICD-10-CM

## 2023-05-05 DIAGNOSIS — K766 Portal hypertension: Secondary | ICD-10-CM | POA: Diagnosis not present

## 2023-05-05 DIAGNOSIS — E871 Hypo-osmolality and hyponatremia: Secondary | ICD-10-CM | POA: Diagnosis not present

## 2023-05-05 DIAGNOSIS — N179 Acute kidney failure, unspecified: Secondary | ICD-10-CM

## 2023-05-05 DIAGNOSIS — K219 Gastro-esophageal reflux disease without esophagitis: Secondary | ICD-10-CM | POA: Diagnosis not present

## 2023-05-05 DIAGNOSIS — K3189 Other diseases of stomach and duodenum: Secondary | ICD-10-CM

## 2023-05-05 DIAGNOSIS — R112 Nausea with vomiting, unspecified: Secondary | ICD-10-CM

## 2023-05-05 DIAGNOSIS — D270 Benign neoplasm of right ovary: Secondary | ICD-10-CM

## 2023-05-05 DIAGNOSIS — K7460 Unspecified cirrhosis of liver: Secondary | ICD-10-CM

## 2023-05-05 DIAGNOSIS — D649 Anemia, unspecified: Secondary | ICD-10-CM | POA: Diagnosis not present

## 2023-05-05 DIAGNOSIS — K767 Hepatorenal syndrome: Secondary | ICD-10-CM | POA: Insufficient documentation

## 2023-05-05 DIAGNOSIS — K7031 Alcoholic cirrhosis of liver with ascites: Secondary | ICD-10-CM

## 2023-05-05 LAB — CULTURE, BODY FLUID W GRAM STAIN -BOTTLE: Culture: NO GROWTH

## 2023-05-05 LAB — CBC
HCT: 22.9 % — ABNORMAL LOW (ref 36.0–46.0)
Hemoglobin: 7.8 g/dL — ABNORMAL LOW (ref 12.0–15.0)
MCH: 29.8 pg (ref 26.0–34.0)
MCHC: 34.1 g/dL (ref 30.0–36.0)
MCV: 87.4 fL (ref 80.0–100.0)
Platelets: 196 10*3/uL (ref 150–400)
RBC: 2.62 MIL/uL — ABNORMAL LOW (ref 3.87–5.11)
RDW: 14.9 % (ref 11.5–15.5)
WBC: 9.8 10*3/uL (ref 4.0–10.5)
nRBC: 0 % (ref 0.0–0.2)

## 2023-05-05 LAB — BPAM RBC
Blood Product Expiration Date: 202407022359
Blood Product Expiration Date: 202407022359
ISSUE DATE / TIME: 202406181452
ISSUE DATE / TIME: 202406182101
Unit Type and Rh: 6200
Unit Type and Rh: 6200

## 2023-05-05 LAB — TYPE AND SCREEN
ABO/RH(D): AB POS
Antibody Screen: NEGATIVE
Unit division: 0

## 2023-05-05 LAB — COMPREHENSIVE METABOLIC PANEL
ALT: 17 U/L (ref 0–44)
AST: 29 U/L (ref 15–41)
Albumin: 4.7 g/dL (ref 3.5–5.0)
Alkaline Phosphatase: 86 U/L (ref 38–126)
Anion gap: 15 (ref 5–15)
BUN: 43 mg/dL — ABNORMAL HIGH (ref 6–20)
CO2: 12 mmol/L — ABNORMAL LOW (ref 22–32)
Calcium: 8.2 mg/dL — ABNORMAL LOW (ref 8.9–10.3)
Chloride: 101 mmol/L (ref 98–111)
Creatinine, Ser: 4.59 mg/dL — ABNORMAL HIGH (ref 0.44–1.00)
GFR, Estimated: 12 mL/min — ABNORMAL LOW (ref >60)
Glucose, Bld: 111 mg/dL — ABNORMAL HIGH (ref 70–99)
Potassium: 2.7 mmol/L — CL (ref 3.5–5.1)
Sodium: 128 mmol/L — ABNORMAL LOW (ref 135–145)
Total Bilirubin: 4.5 mg/dL — ABNORMAL HIGH (ref 0.3–1.2)
Total Protein: 7.2 g/dL (ref 6.5–8.1)

## 2023-05-05 LAB — MAGNESIUM: Magnesium: 2 mg/dL (ref 1.7–2.4)

## 2023-05-05 LAB — PROTIME-INR
INR: 1.8 — ABNORMAL HIGH (ref 0.8–1.2)
Prothrombin Time: 21.5 seconds — ABNORMAL HIGH (ref 11.4–15.2)

## 2023-05-05 MED ORDER — OCTREOTIDE ACETATE 100 MCG/ML IJ SOLN
100.0000 ug | Freq: Three times a day (TID) | INTRAMUSCULAR | Status: DC
  Administered 2023-05-05 – 2023-05-06 (×4): 100 ug via SUBCUTANEOUS
  Filled 2023-05-05 (×16): qty 1

## 2023-05-05 MED ORDER — CAMPHOR-MENTHOL 0.5-0.5 % EX LOTN
TOPICAL_LOTION | CUTANEOUS | Status: DC | PRN
  Filled 2023-05-05: qty 222

## 2023-05-05 MED ORDER — TEMAZEPAM 15 MG PO CAPS: 15.0000 mg | ORAL_CAPSULE | Freq: Every evening | ORAL | Status: DC | PRN

## 2023-05-05 MED ORDER — ALBUMIN HUMAN 25 % IV SOLN
50.0000 g | Freq: Three times a day (TID) | INTRAVENOUS | Status: AC
  Administered 2023-05-05 – 2023-05-06 (×3): 50 g via INTRAVENOUS
  Filled 2023-05-05 (×3): qty 200

## 2023-05-05 MED ORDER — POTASSIUM CHLORIDE CRYS ER 20 MEQ PO TBCR: 40.0000 meq | EXTENDED_RELEASE_TABLET | Freq: Once | ORAL | Status: DC

## 2023-05-05 MED ORDER — POTASSIUM CHLORIDE CRYS ER 20 MEQ PO TBCR
20.0000 meq | EXTENDED_RELEASE_TABLET | Freq: Once | ORAL | Status: AC
  Administered 2023-05-05: 20 meq via ORAL
  Filled 2023-05-05: qty 1

## 2023-05-05 MED ORDER — POTASSIUM CHLORIDE 10 MEQ/100ML IV SOLN
10.0000 meq | INTRAVENOUS | Status: AC
  Administered 2023-05-05 (×4): 10 meq via INTRAVENOUS
  Filled 2023-05-05 (×4): qty 100

## 2023-05-05 MED ORDER — IRON SUCROSE 500 MG IVPB - SIMPLE MED
500.0000 mg | Freq: Once | INTRAVENOUS | Status: AC
  Administered 2023-05-05: 500 mg via INTRAVENOUS
  Filled 2023-05-05: qty 500

## 2023-05-05 MED ORDER — ADULT MULTIVITAMIN W/MINERALS CH
1.0000 | ORAL_TABLET | Freq: Every day | ORAL | Status: DC
  Administered 2023-05-05 – 2023-05-10 (×6): 1 via ORAL
  Filled 2023-05-05 (×6): qty 1

## 2023-05-05 MED ORDER — CHLORHEXIDINE GLUCONATE CLOTH 2 % EX PADS
6.0000 | MEDICATED_PAD | Freq: Every day | CUTANEOUS | Status: DC
  Administered 2023-05-05 – 2023-05-10 (×7): 6 via TOPICAL

## 2023-05-05 MED ORDER — NICOTINE 14 MG/24HR TD PT24
14.0000 mg | MEDICATED_PATCH | Freq: Every day | TRANSDERMAL | Status: DC
  Administered 2023-05-05 – 2023-05-10 (×6): 14 mg via TRANSDERMAL
  Filled 2023-05-05 (×6): qty 1

## 2023-05-05 NOTE — Progress Notes (Signed)
Subjective: Not feeling good today. Has continued LLQ pain. Notes she vomited last night, nausea is somewhat better today. She thinks she vomited after xifaxan. Family at bedside reports small BM this morning. Alert and oriented   Objective: Vital signs in last 24 hours: Temp:  [97.5 F (36.4 C)-98.2 F (36.8 C)] 97.6 F (36.4 C) (06/18 2333) Pulse Rate:  [84-114] 94 (06/19 0900) Resp:  [11-22] 17 (06/19 0900) BP: (88-125)/(48-75) 115/61 (06/19 0900) SpO2:  [98 %-100 %] 100 % (06/19 0900) Weight:  [51.7 kg-53.2 kg] 53.2 kg (06/19 0428) Last BM Date : 05/05/23 General:   Alert and oriented, pleasant Head:  Normocephalic and atraumatic. Eyes:  scleral icterus  Mouth:  Without lesions, mucosa pink and moist.  Heart:  S1, S2 present, no murmurs noted.  Lungs: Clear to auscultation bilaterally, without wheezing, rales, or rhonchi.  Abdomen:  Bowel sounds present, some distention though non taut. TTP of LLQ. No HSM or hernias noted. No rebound or guarding. No masses appreciated  Msk:  Symmetrical without gross deformities. Normal posture. Extremities:  Without clubbing or edema. Neurologic:  Alert and  oriented x4;  grossly normal neurologically. No asterixis Skin:  Warm and dry, intact without significant lesions.  Psych:  Alert and cooperative. Normal mood and affect.  Intake/Output from previous day: 06/18 0701 - 06/19 0700 In: 1049.4 [P.O.:240; XWRUE:454; IV Piggyback:146.4] Out: 100 [Urine:100] Intake/Output this shift: No intake/output data recorded.  Lab Results: Recent Labs    05/04/23 0837 05/04/23 1217 05/04/23 2005 05/05/23 0515  WBC 12.4* 13.0*  --  9.8  HGB 6.3* 5.8* 6.6* 7.8*  HCT 18.2* 17.6* 19.1* 22.9*  PLT 247 244  --  196   BMET Recent Labs    05/04/23 0837 05/04/23 1217 05/05/23 0515  NA 129* 125* 128*  K 3.1* 3.1* 2.7*  CL 105 102 101  CO2 11* 10* 12*  GLUCOSE 113* 129* 111*  BUN 42* 43* 43*  CREATININE 5.20* 5.17* 4.59*  CALCIUM 7.8* 7.5*  8.2*   LFT Recent Labs    05/04/23 0837 05/04/23 1217 05/05/23 0515  PROT 5.8* 5.8* 7.2  ALBUMIN 2.7* 2.7* 4.7  AST 47* 44* 29  ALT 26 25 17   ALKPHOS 122 117 86  BILITOT 1.0 0.9 4.5*   PT/INR Recent Labs    05/04/23 0837 05/05/23 0515  LABPROT 19.8* 21.5*  INR 1.7* 1.8*    Assessment: 42 year old female with PMH of anxiety, depression, decompensated alcoholic cirrhosis, PBC,  alcoholic hepatitis December 2023, HE, recurrent ascites requiring frequent paras, chronic anemia with recurrent hospitalzations due to acute on chronic anemia, most recent admission in May 2024 with acute on chronic anemia and AKI, presenting to the ED due to outpatient labs with worsening anemia, AKI  Acute on chronic anemia: ongoing.  Frequent hospitalizations, most recent in May with hemoglobin down to 7, no evidence of iron, B12, folate deficiency.  Recent outpatient labs with hemoglobin of 6.3.  Hemoglobin 5.8 on arrival to the ED.  She denies GI bleeding.  FOBT negative in the ED.  She has reported low-volume intermittent vaginal bleeding.  Etiology of acute on chronic anemia is unclear.  has never had a colonoscopy. Anemia could be secondary to kidney and liver disease, oozing from PHG (less likely given negative FOBT), query if this is GYN related given history of vaginal bleeding, she failed to follow up with GYN as previously recommended. Last EGD in April. Will continue with PPI BID, trending H&H, transfusions as needed. She received 2  units PRBCs yesterday.   She will need colonoscopy at some point, likely as outpatient unless she has overt GI bleeding. Recommend GYN evaluation for ongoing vaginal bleeding.  AKI/?Hepatorenal syndrome:  Cr previously normal, up to 5.17 on admission, 4.59 today.  Likely multifactorial in the setting of hypotension outpatient (nadolol placed on hold yesterday), frequent paras, diuretics, frequent BMs related to lactulose, and recent nausea and vomiting.  Diuretics  currently on hold.  Started on IV albumin Q8H x 3 doses and midodrine 5 mg TID.  Nephrology involved, recommended continued hold of diuretics, continued albumin today.    Nausea/vomiting: etiology unclear, likely multifactorial. Has chronic LLQ pain, unclear if this is related to her N/v. Consider CT A/P w/o contrast if symptoms persist, EGD to be done inpatient as above  Weight loss: down 13 pounds in the past month. Likely secondary to decreased PO intake/nausea/vomiting, recent EGD as above, no TCS. Given multiple ongoing problems this admission, no overt bleeding can plan to pursue outpatient colonoscopy for further evaluation.   Decompensated cirrhosis: Secondary to EtOH and PBC.  No EtOH since November 2023.  Cirrhosis has been complicated with recurrent ascites, HCC previously, coagulopathy with elevated INR.  MELD 3.0 is**currently undergoing TIPS evaluation.  Has been referred to [facility] for transplant evaluation, no appointment scheduled yet.  She has recurrent ascites in setting of noncompliance with 2 g sodium diet, hypoalbuminemia.  On Lasix 40 mg BID only as outpatient, she stopped spironolactone 2 weeks ago due intolerance of taste.  Has been undergoing frequent paracentesis up to biweekly recently.  No true history of SBP. Last para 6/14 without SBP. ascites on exam today but not tense, she feels ascites is about the same as it has been.  Diuretics are on hold in setting of AKI.nephrology on board.  History of HE, ammonia mildly elevated at 76 yesterday but not appearing to clinically be encephalopathic.  No asterixis.  Taking lactulose only as outpatient, having 5 BMs per day.  She stopped Xifaxan 2 weeks ago due to intolerance of taste.  Will continue lactulose for now, restarted xifaxan though she thinks she vomited after taking this last night.   History of EV's.  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  Started on nadolol at that time.  Nadolol placed  on hold yesterday due to hypotension.  Will consider resuming BB once AKI has improved and BP is stable.  MELD 3.0-- 36  Plan: Trend h&h, transfuse hgb <7 Monitor for overt GI bleeding PPI BID Colonoscopy/possible repeat EGD as outpatient unless overt GI bleeding Resume nadolol once BP/AKI improved  Resume diuretics once AKI stable 2g sodium diet Continue lactulose Xifaxan 550mg  BID Daily MELD labs Needs to schedule outpatient appt for [facility] transplant eval GYN evaluation    LOS: 1 day    05/05/2023, 10:03 AM   Shia Eber L. Jeanmarie Hubert, MSN, APRN, AGNP-C Adult-Gerontology Nurse Practitioner [facility] at [facility]

## 2023-05-05 NOTE — Discharge Instructions (Addendum)
Food Resources   Agency Name: [employer] Address: [address] [place], [place] [address] Phone: [phone] Website: [email or web address].[email or web address] Email: [email or web address]  Services Offered: Programme researcher, broadcasting/film/video, CPR Classes, disaster services (fire)  Agency Name: [facility] Address: [address] [place], [place] [address] Phone: [phone] Website: www.adtsrc.org Services Offered: Meals on [employer]. Home care, at home assisted living, volunteer services, [facility], transportation  Agency Name: [employer] Address: Sites vary. Must call first. Food Pantry location: [address], [place], [place] [address] [employer] Phone: [phone] Website: none Services Offered: Museum/gallery curator, utility assistance if funds available Technical brewer for all of [place], [employer], [employer], Office manager and [employer] for [employer] area only) Walk-in current Id and current address verification required. WedThurs: 9:30-12:00  Agency Name: [facility] Address: [address] [place], [place] [address] Phone: [phone] Website: none Services Offered: Food assistance  Agency Name: [employer] Address: [address] [place], [place] [address] Phone: [phone] Contact: Annabelle Harman Website: none Services Offered: Serve 1 hot meal a day at 11:00 am Monday-Sunday and also 4 pm on Sunday  Agency Name: [facility] and [employer] Services/Social Services Address: [phone], [place], [place] [address] Phone: [phone] Website: [email or web address] Services Offered: Food Stamps December 16, 2015 7  Agency Name: [facility] Address: [address], [place], [place] [address] Contact: Jackson Latino Director Phone: [phone] Services Offered: Programme researcher, broadcasting/film/video, [employer], health clinic  (8:30-11:30)  Agency Name: [employer] Address: [address] [place] / [address] [place] Phone: [phone] [place] / [phone] [place] Website: [email or web address] Services Offered: Civil Service fast streamer, food, showers, hygiene products utility payment assistance, thrift shops, rental assistance

## 2023-05-05 NOTE — Consult Note (Signed)
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 Agree with midodrine. Will add octreotidesubq TID -renal ultrasound to rule out obstruction  -will check ANCA, ANA, C3,C4, anti dsDNA Ab, anti-GBM given rather significant microscopic hematuria (this is probably a sequelae of her menorrhagia) Avoid nephrotoxic medications including NSAIDs and iodinated intravenous contrast exposure unless the latter is absolutely indicated.  Preferred narcotic agents for pain control are hydromorphone, fentanyl, and methadone. Morphine should not be used. Avoid Baclofen and avoid oral sodium phosphate and magnesium citrate based laxatives / bowel preps. Continue strict Input and Output monitoring. Will monitor the patient closely with you and intervene or adjust therapy as indicated by changes in clinical status/labs   Acute on chronic anemia -transfuse prn for Hgb <7 -GI following, open for EGD, c-scope  Decompensated cirrhosis -GI following -if paracentesis is to be performed then would recommend not removing more than 4L and would recommend giving albumin with para's  Hypotension -on midodrine 5mg  TID & octreotide as above, can titrate accordingly  Hypokalemia -replete PRN  Hyponatremia -acute on chronic in the context of AKI and cirrhosis. Na up to 128 today. Would recommend fluids restriction of 1.5L/day for now  Metabolic acidosis -likely multifactorial: AKI and decreased lactate clearance from cirrhosis -currently on nahco3 650mg  TID, can titrate to 1300mg  if needed  Recommendations conveyed to primary service.    Anthony Sar [facility] 05/05/2023 9:34  AM   _____________________________________________________________________________________   History of Present Illness: Mallory Mendoza is a/an 42 y.o. female with a past medical history of alcoholic cirrhosis, recurrent ascites requiring frequent paracentesis, recurrent GIB, tobacco abuse, recent h/o of menorrhagia, anxiety/depression who presents to [facility] with abnormal labs (AKI and worsening anemia). Found to have a hgb of 5.8 and Cr of 5.17. Received 2u PRBC. Occult stool study negative. She is to be evaluated for TIPS as an outpatient given recurrent bleeding from portal gastropathy. Statred on midodrine and albumin by GI. Has not established with [facility] for liver txp eval.   Patient seen and examined bedside in ICU. She reports that she really wasn't urinating much anyway when she was taking the lasix. Has not been taking spironolactone given bad taste and would make her nauseated. Her biggest issue is anxiety and insomnia (chronic issues) which she will be discussing with the primary service. Has also been having some dizziness & fatigue as well. She reports that she typically has vaginal spotting twice a week. Denies any chest pain, SOB, swelling in her legs, dysuria, hematuria.   Medications:  Current Facility-Administered Medications  Medication Dose Route Frequency Provider Last Rate Last Admin   0.9 %  sodium chloride infusion (Manually program via Guardrails IV Fluids)   Intravenous Once Sherryll Burger, Pratik D, DO   Held at 05/04/23 1436   0.9 %  sodium chloride infusion (Manually program via Guardrails IV Fluids)   Intravenous Once Adefeso, Oladapo, DO       acetaminophen (TYLENOL) tablet 650 mg  650 mg Oral Q6H PRN Sherryll Burger, Pratik D, DO       Or   acetaminophen (TYLENOL) suppository 650 mg  650 mg Rectal Q6H PRN Sherryll Burger, Pratik D, DO       Chlorhexidine Gluconate Cloth 2 % PADS 6 each  6 each Topical Daily Sherryll Burger,  Pratik D, DO       diphenhydrAMINE (BENADRYL) capsule 25 mg  25 mg Oral QHS PRN Sherryll Burger,  Pratik D, DO       feeding supplement (ENSURE ENLIVE / ENSURE PLUS) liquid 237 mL  237 mL Oral BID BM Sherryll Burger, Pratik D, DO       folic acid (FOLVITE) tablet 1 mg  1 mg Oral Daily Shah, Pratik D, DO       HYDROmorphone (DILAUDID) injection 0.5-1 mg  0.5-1 mg Intravenous Q2H PRN Sherryll Burger, Pratik D, DO   1 mg at 05/05/23 0147   hydrOXYzine (ATARAX) tablet 25 mg  25 mg Oral Q6H PRN Sherryll Burger, Pratik D, DO   25 mg at 05/04/23 2209   iron sucrose (VENOFER) 500 mg in sodium chloride 0.9 % 250 mL IVPB  500 mg Intravenous Once Vassie Loll, MD       lactulose (CHRONULAC) 10 GM/15ML solution 30 g  30 g Oral BID Sherryll Burger, Pratik D, DO   30 g at 05/04/23 1716   midodrine (PROAMATINE) tablet 5 mg  5 mg Oral TID WC Ermalinda Memos S, PA-C   5 mg at 05/05/23 0745   mirtazapine (REMERON) tablet 7.5 mg  7.5 mg Oral QHS Sherryll Burger, Pratik D, DO   7.5 mg at 05/04/23 2201   nicotine (NICODERM CQ - dosed in mg/24 hours) patch 14 mg  14 mg Transdermal Once Sherryll Burger, Pratik D, DO   14 mg at 05/04/23 1513   nicotine (NICODERM CQ - dosed in mg/24 hours) patch 14 mg  14 mg Transdermal Daily Vassie Loll, MD       ondansetron [facility]) tablet 4 mg  4 mg Oral Q6H PRN Sherryll Burger, Pratik D, DO       Or   ondansetron (ZOFRAN) injection 4 mg  4 mg Intravenous Q6H PRN Sherryll Burger, Pratik D, DO   4 mg at 05/04/23 1753   pantoprazole (PROTONIX) EC tablet 40 mg  40 mg Oral BID Maurilio Lovely D, DO   40 mg at 05/04/23 2200   potassium chloride (KLOR-CON M) CR tablet 10 mEq  10 mEq Oral Daily Sherryll Burger, Pratik D, DO   10 mEq at 05/04/23 1716   potassium chloride 10 mEq in 100 mL IVPB  10 mEq Intravenous Q1 Hr x 4 Adefeso, Oladapo, DO 100 mL/hr at 05/05/23 0900 10 mEq at 05/05/23 0900   potassium chloride SA (KLOR-CON M) CR tablet 20 mEq  20 mEq Oral Once Vassie Loll, MD       prochlorperazine (COMPAZINE) injection 10 mg  10 mg Intravenous Q6H PRN Adefeso, Oladapo, DO   10 mg at 05/05/23 0904   rifaximin (XIFAXAN) tablet 550 mg  550 mg Oral BID Maurilio Lovely D, DO   550 mg  at 05/04/23 2201   sodium bicarbonate tablet 650 mg  650 mg Oral TID Maurilio Lovely D, DO   650 mg at 05/04/23 2201   ursodiol (ACTIGALL) capsule 300 mg  300 mg Oral TID Maurilio Lovely D, DO   300 mg at 05/04/23 2200     ALLERGIES Patient has no known allergies.  MEDICAL HISTORY Past Medical History:  Diagnosis Date   Abscess    Anxiety    Cirrhosis (HCC)    PBC/ETOH   Depression    Nausea & vomiting 03/09/2023     SOCIAL HISTORY Social History   Socioeconomic History   Marital status: Single    Spouse name: Not on file   Number of children: 4  Years of education: Not on file   Highest education level: Not on file  Occupational History   Not on file  Tobacco Use   Smoking status: Every Day    Packs/day: 0.50    Years: 12.00    Additional pack years: 0.00    Total pack years: 6.00    Types: Cigarettes   Smokeless tobacco: Never  Vaping Use   Vaping Use: Never used  Substance and Sexual Activity   Alcohol use: Not Currently    Alcohol/week: 14.0 standard drinks of alcohol    Types: 14 Cans of beer per week    Comment: Quit November 2023   Drug use: Not Currently    Types: Marijuana    Comment: "sometimes"   Sexual activity: Not Currently    Birth control/protection: Surgical    Comment: tubal  Other Topics Concern   Not on file  Social History Narrative   ** Merged History Encounter **       Social Determinants of Health   Financial Resource Strain: Medium Risk (03/11/2021)   Overall Financial Resource Strain (CARDIA)    Difficulty of Paying Living Expenses: Somewhat hard  Food Insecurity: Food Insecurity Present (05/04/2023)   Hunger Vital Sign    Worried About Running Out of Food in the Last Year: Often true    Ran Out of Food in the Last Year: Often true  Transportation Needs: Unmet Transportation Needs (05/04/2023)   PRAPARE - Transportation    Lack of Transportation (Medical): Yes    Lack of Transportation (Non-Medical): Yes  Physical Activity:  Insufficiently Active (03/11/2021)   Exercise Vital Sign    Days of Exercise per Week: 1 day    Minutes of Exercise per Session: 10 min  Stress: Stress Concern Present (03/11/2021)   [employer] of Occupational Health - Occupational Stress Questionnaire    Feeling of Stress : Very much  Social Connections: Moderately Integrated (03/11/2021)   Social Connection and Isolation Panel [NHANES]    Frequency of Communication with Friends and Family: More than three times a week    Frequency of Social Gatherings with Friends and Family: More than three times a week    Attends Religious Services: 1 to 4 times per year    Active Member of [employer] or Organizations: No    Attends Banker Meetings: 1 to 4 times per year    Marital Status: Never married  Intimate Partner Violence: Not At Risk (04/05/2023)   Humiliation, Afraid, Rape, and Kick questionnaire    Fear of Current or Ex-Partner: No    Emotionally Abused: No    Physically Abused: No    Sexually Abused: No     FAMILY HISTORY Family History  Problem Relation Age of Onset   Hypertension Sister    Hypertension Mother      Review of Systems: 12 systems reviewed Otherwise as per HPI, all other systems reviewed and negative  Physical Exam: Vitals:   05/05/23 0800 05/05/23 0900  BP: (!) 104/53 115/61  Pulse: (!) 101 94  Resp: 11 17  Temp:    SpO2: 100% 100%   No intake/output data recorded.  Intake/Output Summary (Last 24 hours) at 05/05/2023 0934 Last data filed at 05/05/2023 0428 Gross per 24 hour  Intake 1049.39 ml  Output 100 ml  Net 949.39 ml   General: no acute distress HEENT: anicteric sclera, oropharynx clear without lesions CV: regular rate, normal rhythm, no murmurs, no gallops, no rubs,  Lungs: clear to auscultation  bilaterally, normal work of breathing Abd: more on the firm side, distended & TTP Skin: no visible lesions or rashes Psych: alert, engaged, appropriate mood and affect Musculoskeletal:  no edema Neuro: normal speech, no gross focal deficits   Test Results Reviewed Lab Results  Component Value Date   NA 128 (L) 05/05/2023   K 2.7 (LL) 05/05/2023   CL 101 05/05/2023   CO2 12 (L) 05/05/2023   BUN 43 (H) 05/05/2023   CREATININE 4.59 (H) 05/05/2023   CALCIUM 8.2 (L) 05/05/2023   ALBUMIN 4.7 05/05/2023   PHOS 2.8 03/09/2023     I have reviewed all relevant outside healthcare records related to the patient's kidney injury.
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
## 2023-05-05 NOTE — Progress Notes (Signed)
Initial Nutrition Assessment  DOCUMENTATION CODES:   Not applicable  INTERVENTION:  Liberalize diet to 2g sodium Ensure Enlive po BID, each supplement provides 350 kcal and 20 grams of protein. Snacks TID between meals for smaller, more frequent intake MVI with minerals daliy  NUTRITION DIAGNOSIS:   Inadequate oral intake related to acute illness as evidenced by meal completion < 50%.  GOAL:   Patient will meet greater than or equal to 90% of their needs  MONITOR:   PO intake, Supplement acceptance, Labs, Weight trends  REASON FOR ASSESSMENT:   Malnutrition Screening Tool    ASSESSMENT:   Pt admitted with abnormal labs r/t acute on chronic symptomatic anemia. PMH significant for alcoholic liver cirrhosis with recurrent ascites requiring frequent paracentesis, recurrent GIB, anxiety, depression, chronic anemia and recent admission d/t menorrhagia with acute on chronic anemia.   Per GI, pt with emesis last night. Nausea somewhat improved today. Etiology unclear. No plans for EGD/colonoscopy inpatient unless overt GIB.   RD working remotely. Pt remains in ICU/SDU. Unable to obtain detailed nutrition related history. Per review of chart, pt has been experiencing decreased appetite and PO intake. Suspect pt likely to also experience early satiety d/t ascites. She would benefit from a liberalized diet and smaller, more frequent meals/snacks to try to optimize PO intake.   Meal completions: 6/18: 30% dinner 6/19:25% breakfast  Reviewed weight history. Pt's weight noted have decreased 19% within the last 6 months which is clinically significant for time frame. Question whether this is all true dry weight loss d/t recurrent ascites and frequent paracentesis.   Medications: folvite, lactulose, remeron, octreotide, protonix, klor-con, sodium bicarbonate Drips: albumin, venofer  Labs: sodium 128, potassium 2.7, BUN 43, Cr 4.59, GFR 12, hgb 7.8  NUTRITION - FOCUSED PHYSICAL  EXAM: RD working remotely. Deferred to follow up.   Diet Order:   Diet Order             Diet Heart Room service appropriate? Yes; Fluid consistency: Thin; Fluid restriction: 1200 mL Fluid  Diet effective now                   EDUCATION NEEDS:   No education needs have been identified at this time  Skin:  Skin Assessment: Reviewed RN Assessment  Last BM:  6/19  Height:   Ht Readings from Last 1 Encounters:  05/04/23 5\' 4"  (1.626 m)    Weight:   Wt Readings from Last 1 Encounters:  05/05/23 53.2 kg   BMI:  Body mass index is 20.13 kg/m.  Estimated Nutritional Needs:   Kcal:  1600-1800  Protein:  75-90g  Fluid:  >/=1.6L  Drusilla Kanner, RDN, LDN Clinical Nutrition

## 2023-05-05 NOTE — Progress Notes (Signed)
Progress Note   Patient: Mallory Mendoza ZOX:[id number] DOB: Mar 26, 1981 DOA: 05/04/2023     1 DOS: the patient was seen and examined on 05/05/2023   Brief hospital admission narrative course: As per H&P written by Dr. Sherryll Burger on 05/05/2023 Mallory Mendoza is a 42 y.o. female with medical history significant for alcoholic liver cirrhosis with recurrent ascites requiring frequent paracentesis, recurrent GI bleed, history of anxiety and depression, chronic anemia, and recent discharge for menorrhagia with acute on chronic anemia requiring 1 unit PRBC transfusion who presents to the ED with abnormal labs that were drawn by GI.  She was noted to have a hemoglobin of 5.8 and worsening creatinine levels up to 5.17.  It appears that her creatinine has recently been upward trending on last admission up to around 2-2.5.  She is also noted to have chronic hyponatremia.  She complains of feeling somewhat fatigued and has chronic abdominal pain related to her abdominal distention.  She continues to have some mild vaginal bleeding and has not followed up with gynecology since her recent discharge and is not currently on Megace.  Additionally she has been complaining of shivering and chills and feels quite cold and uses several blankets every day to help warm up.   ED Course: Vital signs stable and patient is afebrile.  Hemoglobin 5.8 with 2 units PRBCs ordered.  Creatinine 5.17 with some metabolic acidosis discussion had with nephrology with no emergent need for hemodialysis, but they will follow and routine consultation.  GI to follow inpatient.  Ammonia is 76, but she is mentating well.  Stool occult study negative.  Assessment and Plan: 1-acute on chronic symptomatic anemia -In the setting of ongoing vaginal bleeding -Status post 2 unit PRBC transfusion -Fecal occult blood test negative -Continue patient follow-up with Dr. Despina Hidden as an outpatient -if needed will give depo shot IM -Stable to transfer to telemetry  bed.  2-worsening acute kidney injury with metabolic acidosis -Continue to follow nephrology service recommendation -Maintain adequate hydration -Avoid nephrotoxic agent -Continue serum bicarbonate, PPI, octreotide and albumin -Follow renal function trend.  3-alcoholic liver cirrhosis with portal hypertension and recurring ascites -Echo will be done to facilitate/sees TIPS procedure as an outpatient -Continue to follow GI service recommendation -Continue PPI -Continue lactulose and rifaximin -Normal mentation at the moment. -Outpatient endoscopic evaluation anticipated.  4-chronic pain syndrome -Continue as needed analgesics -Avoid the use of NSAIDs.  5-hyponatremia -Appears to be chronic in the setting of renal failure and liver cirrhosis with chronic diuretic usage -Continue to follow electrolytes trend and follow nephrology recommendations.  6-history of anxiety/depression -Overall stable mood -No suicidal ideation or hallucination -Continue anxiolytic/antidepressant regimen.  7-tobacco abuse -Cessation counseling provided -Continue nicotine patch.  8-insomnia -As needed Restoril has been ordered.  Subjective:  No fever, no chest pain, no nausea or vomiting.  Complaining of abdominal pain, insomnia and generalized weakness.  Physical Exam: Vitals:   05/05/23 1500 05/05/23 1600 05/05/23 1700 05/05/23 1800  BP: 109/63 116/74 123/60 (!) 101/57  Pulse: 96 (!) 102 (!) 108 (!) 105  Resp: 12 14 16  (!) 9  Temp:  (!) 97.1 F (36.2 C)    TempSrc:  Axillary    SpO2: 100% 100% 99% 96%  Weight:      Height:       General exam: Alert, awake, oriented x 3; reporting generalized weakness and chronic abdominal pain.  No nausea, no vomiting.  Afebrile. Respiratory system: Clear to auscultation. Respiratory effort normal.  Good saturation on room  air. Cardiovascular system:RRR. No rubs or gallops; no JVD. Gastrointestinal system: Abdomen is distended, positive ascites,  generalized tenderness with palpation; positive bowel sounds.  Guarding. Central nervous system: Alert and oriented. No focal neurological deficits. Extremities: No cyanosis, clubbing or edema. Skin: No petechiae. Psychiatry: Judgement and insight appear normal.  Data Reviewed: Magnesium: 2.0 Comprehensive metabolic panel: Sodium 128, potassium 2.7, chloride 101, bicarb 12, glucose 111, BUN 43, creatinine 4.59 and normal LFTs CBC: WBCs 9.8, hemoglobin 7.8 and platelet count 196 K  Family Communication: Mother at bedside.  Disposition: Status is: Inpatient Remains inpatient appropriate because: Continue treatment for acute on chronic renal failure.   Planned Discharge Destination: Home   Time spent: 50 minutes  Author: Vassie Loll, MD 05/05/2023 6:56 PM  For on call review [email or web address].

## 2023-05-06 ENCOUNTER — Inpatient Hospital Stay (HOSPITAL_COMMUNITY): Payer: Medicaid Other

## 2023-05-06 ENCOUNTER — Other Ambulatory Visit (HOSPITAL_COMMUNITY): Payer: Self-pay | Admitting: *Deleted

## 2023-05-06 ENCOUNTER — Inpatient Hospital Stay (HOSPITAL_COMMUNITY)
Admission: RE | Admit: 2023-05-06 | Discharge: 2023-05-06 | Disposition: A | Discharge status: Home / Self Care | Payer: Medicaid Other | Source: Ambulatory Visit | Attending: Gastroenterology | Admitting: Gastroenterology

## 2023-05-06 DIAGNOSIS — K219 Gastro-esophageal reflux disease without esophagitis: Secondary | ICD-10-CM | POA: Diagnosis not present

## 2023-05-06 DIAGNOSIS — K766 Portal hypertension: Secondary | ICD-10-CM | POA: Diagnosis not present

## 2023-05-06 DIAGNOSIS — R008 Other abnormalities of heart beat: Secondary | ICD-10-CM | POA: Diagnosis not present

## 2023-05-06 DIAGNOSIS — E871 Hypo-osmolality and hyponatremia: Secondary | ICD-10-CM | POA: Diagnosis not present

## 2023-05-06 DIAGNOSIS — D649 Anemia, unspecified: Secondary | ICD-10-CM | POA: Diagnosis not present

## 2023-05-06 LAB — ANTI-DNA ANTIBODY, DOUBLE-STRANDED: ds DNA Ab: <1 IU/mL (ref 0–9)

## 2023-05-06 LAB — ECHOCARDIOGRAM COMPLETE
AR max vel: 2.45 cm2
AV Area VTI: 2.22 cm2
AV Area mean vel: 2.42 cm2
AV Mean grad: 8 mmHg
AV Peak grad: 18 mmHg
Ao pk vel: 2.12 m/s
Area-P 1/2: 2.17 cm2
Height: 64 in
S' Lateral: 1.8 cm
Weight: 1876.56 oz

## 2023-05-06 LAB — RENAL FUNCTION PANEL
Albumin: 4.7 g/dL (ref 3.5–5.0)
Albumin: 6 g/dL — ABNORMAL HIGH (ref 3.5–5.0)
Anion gap: 14 (ref 5–15)
Anion gap: 17 — ABNORMAL HIGH (ref 5–15)
BUN: 43 mg/dL — ABNORMAL HIGH (ref 6–20)
BUN: 42 mg/dL — ABNORMAL HIGH (ref 6–20)
CO2: 12 mmol/L — ABNORMAL LOW (ref 22–32)
CO2: 12 mmol/L — ABNORMAL LOW (ref 22–32)
Calcium: 8.3 mg/dL — ABNORMAL LOW (ref 8.9–10.3)
Calcium: 9 mg/dL (ref 8.9–10.3)
Chloride: 102 mmol/L (ref 98–111)
Chloride: 100 mmol/L (ref 98–111)
Creatinine, Ser: 4.42 mg/dL — ABNORMAL HIGH (ref 0.44–1.00)
Creatinine, Ser: 4.64 mg/dL — ABNORMAL HIGH (ref 0.44–1.00)
GFR, Estimated: 12 mL/min — ABNORMAL LOW (ref >60)
GFR, Estimated: 12 mL/min — ABNORMAL LOW (ref >60)
Glucose, Bld: 130 mg/dL — ABNORMAL HIGH (ref 70–99)
Glucose, Bld: 114 mg/dL — ABNORMAL HIGH (ref 70–99)
Phosphorus: 5.1 mg/dL — ABNORMAL HIGH (ref 2.5–4.6)
Phosphorus: 5.1 mg/dL — ABNORMAL HIGH (ref 2.5–4.6)
Potassium: 3.4 mmol/L — ABNORMAL LOW (ref 3.5–5.1)
Potassium: 3.2 mmol/L — ABNORMAL LOW (ref 3.5–5.1)
Sodium: 128 mmol/L — ABNORMAL LOW (ref 135–145)
Sodium: 129 mmol/L — ABNORMAL LOW (ref 135–145)

## 2023-05-06 LAB — ANCA PROFILE
Anti-MPO Antibodies: <0.2 units (ref 0.0–0.9)
Anti-PR3 Antibodies: <0.2 units (ref 0.0–0.9)
Atypical P-ANCA titer: <1:20 {titer}
C-ANCA: <1:20 {titer}
P-ANCA: <1:20 {titer}

## 2023-05-06 LAB — CBC
HCT: 23.5 % — ABNORMAL LOW (ref 36.0–46.0)
Hemoglobin: 7.9 g/dL — ABNORMAL LOW (ref 12.0–15.0)
MCH: 29.5 pg (ref 26.0–34.0)
MCHC: 33.6 g/dL (ref 30.0–36.0)
MCV: 87.7 fL (ref 80.0–100.0)
Platelets: 178 10*3/uL (ref 150–400)
RBC: 2.68 MIL/uL — ABNORMAL LOW (ref 3.87–5.11)
RDW: 14.7 % (ref 11.5–15.5)
WBC: 10.5 10*3/uL (ref 4.0–10.5)
nRBC: 0 % (ref 0.0–0.2)

## 2023-05-06 LAB — GLOMERULAR BASEMENT MEMBRANE ANTIBODIES: GBM Ab: <0.2 units (ref 0.0–0.9)

## 2023-05-06 MED ORDER — IOHEXOL 300 MG/ML  SOLN
50.0000 mL | Freq: Once | INTRAMUSCULAR | Status: AC | PRN
  Administered 2023-05-06: 50 mL via ORAL

## 2023-05-06 MED ORDER — SODIUM BICARBONATE 650 MG PO TABS
1300.0000 mg | ORAL_TABLET | Freq: Three times a day (TID) | ORAL | Status: DC
  Administered 2023-05-06 – 2023-05-10 (×12): 1300 mg via ORAL
  Filled 2023-05-06 (×12): qty 2

## 2023-05-06 MED ORDER — MIDODRINE HCL 5 MG PO TABS
10.0000 mg | ORAL_TABLET | Freq: Three times a day (TID) | ORAL | Status: DC
  Administered 2023-05-06 (×2): 10 mg via ORAL
  Filled 2023-05-06 (×3): qty 2

## 2023-05-06 MED ORDER — IOHEXOL 9 MG/ML PO SOLN
ORAL | Status: AC
  Filled 2023-05-06: qty 1000

## 2023-05-06 MED ORDER — SOD CITRATE-CITRIC ACID 500-334 MG/5ML PO SOLN
30.0000 mL | Freq: Two times a day (BID) | ORAL | Status: AC
  Administered 2023-05-06 – 2023-05-07 (×4): 30 mL via ORAL
  Filled 2023-05-06 (×4): qty 30

## 2023-05-06 NOTE — Progress Notes (Signed)
*  PRELIMINARY RESULTS* Echocardiogram 2D Echocardiogram has been performed.  Stacey Drain 05/06/2023, 3:24 PM

## 2023-05-06 NOTE — Progress Notes (Addendum)
Per nursing, patient vomiting last night and around 12:00. Vomited a large amount of clear yellow emesis. She completed barium esophagram earlier this morning. Exam limited by her inability to stand and position independently on exam table. She also was unable to swallow barium tablet. She had trace gastroesophageal reflux to distal esophagus.   Monitor for now, may require CT A/P if ongoing symptoms.   Leanna Battles. Dixon Boos [facility] [phone] 6/20/202412:47 PM   Addendum: discussed with Dr. Marletta Lor, proceed with CT A/P with oral contrast if patient can tolerate. No IV contrast planned.   Leanna Battles. Dixon Boos [facility] [phone] 6/20/20243:04 PM

## 2023-05-06 NOTE — Progress Notes (Signed)
Pt notified MD that the patient had not voided on the current shift yet, pt attempted to void and was unsuccessful earlier in the shift, pt currently states she does not feel like she needs to void at this time, bladder scan showed however nurse informed MD that nurse is unsure if the amount is the patient's ascites or from the bladder, MD states since pt still feels comfortable to wait to see if she voids on her own later in the day.

## 2023-05-06 NOTE — Progress Notes (Signed)
Pt. Came to 300 from ICU today. Pt. Did not eat her supper and attempted to give evening meds with applesauce and she vomited back up within 15 minutes. Pt.'s mother has noted patient has not ate anything all day d/t stomach feeling bad all day.

## 2023-05-06 NOTE — Progress Notes (Signed)
Gastroenterology Progress Note   Referring Provider: No ref. provider found Primary Care Physician:  Benetta Spar, MD Primary Gastroenterologist:  Dr. Marletta Lor  Patient ID: Mallory Mendoza; [id number]; 05-25-1981   Subjective:    Inquiring about holding xifaxan due to nausea. Has been cutting in half as she chronically has difficultly taking larger pills. Does not tolerate taste. Vomiting two nights ago after taking it.  Has had one loose stool this morning, light brown in color. No melena, brbpr. NPO for barium esophagram.  Objective:   Vital signs in last 24 hours: Temp:  [96.1 F (35.6 C)-97.7 F (36.5 C)] 97.6 F (36.4 C) (06/20 0736) Pulse Rate:  [93-108] 94 (06/20 0712) Resp:  [7-22] 15 (06/20 0712) BP: (90-123)/(46-74) 91/49 (06/20 0712) SpO2:  [96 %-100 %] 98 % (06/20 0712) Last BM Date : 05/05/23 General:   Alert,  chronically ill appearing. Pleasant and cooperative in NAD Head:  Normocephalic and atraumatic. Eyes:  Sclera clear, no icterus.   Abdomen:  Soft, some distention. Some llq tenderness. Normal bowel sounds, without guarding, and without rebound.   Extremities:  Without clubbing, deformity or edema. Neurologic:  Alert and  oriented x4;  grossly normal neurologically. Negative asterixis.  Skin:  Intact without significant lesions or rashes. Psych:  Alert and cooperative. Normal mood and affect.  Intake/Output from previous day: 06/19 0701 - 06/20 0700 In: 1863.7 [P.O.:480; IV Piggyback:1383.7] Out: 250 [Urine:250] Intake/Output this shift: No intake/output data recorded.  Lab Results: CBC Recent Labs    05/04/23 1217 05/04/23 2005 05/05/23 0515 05/06/23 0410  WBC 13.0*  --  9.8 10.5  HGB 5.8* 6.6* 7.8* 7.9*  HCT 17.6* 19.1* 22.9* 23.5*  MCV 88.0  --  87.4 87.7  PLT 244  --  196 178   BMET Recent Labs    05/04/23 1217 05/05/23 0515 05/06/23 0410  NA 125* 128* 128*  K 3.1* 2.7* 3.4*  CL 102 101 102  CO2 10* 12* 12*  GLUCOSE  129* 111* 130*  BUN 43* 43* 43*  CREATININE 5.17* 4.59* 4.42*  CALCIUM 7.5* 8.2* 8.3*   LFTs Recent Labs    05/04/23 0837 05/04/23 1217 05/05/23 0515 05/06/23 0410  BILITOT 1.0 0.9 4.5*  --   ALKPHOS 122 117 86  --   AST 47* 44* 29  --   ALT 26 25 17   --   PROT 5.8* 5.8* 7.2  --   ALBUMIN 2.7* 2.7* 4.7 4.7   Recent Labs    05/04/23 1217  LIPASE 51   PT/INR Recent Labs    05/04/23 0837 05/05/23 0515  LABPROT 19.8* 21.5*  INR 1.7* 1.8*         Imaging Studies: US RENAL  Result Date: 05/05/2023 CLINICAL DATA:  Acute kidney injury EXAM: RENAL / URINARY TRACT ULTRASOUND COMPLETE COMPARISON:  Ascites ultrasound 04/30/2019. CT chest abdomen pelvis 12/20/2018 FINDINGS: Right Kidney: Renal measurements: 11.2 x 4.2 x 6.0 cm = volume: 148.4 mL. Echogenicity within normal limits. No mass or hydronephrosis visualized. Left Kidney: Renal measurements: 10.4 x 5.4 x 5.3 cm = volume: 156.9 mL. Echogenicity within normal limits. No mass or hydronephrosis visualized. Bladder: Bladder is poorly seen. Other: Diffuse ascites. IMPRESSION: No collecting system dilatation.  Diffuse ascites. Electronically Signed   By: Karen Kays M.D.   On: 05/05/2023 13:52   US Paracentesis  Result Date: 04/30/2023 INDICATION: Patient with history of ETOH cirrhosis, recurrent ascites. Request for diagnostic and therapeutic paracentesis with 5.0 L limit. EXAM: ULTRASOUND  GUIDED DIAGNOSTIC AND THERAPEUTIC PARACENTESIS MEDICATIONS: 7 mL 1% lidocaine COMPLICATIONS: None immediate. PROCEDURE: Informed written consent was obtained from the patient after a discussion of the risks, benefits and alternatives to treatment. A timeout was performed prior to the initiation of the procedure. Initial ultrasound scanning demonstrates a large amount of ascites within the right lower abdominal quadrant. The right lower abdomen was prepped and draped in the usual sterile fashion. 1% lidocaine was used for local anesthesia. Following  this, a 19 gauge, 7-cm, Yueh catheter was introduced. An ultrasound image was saved for documentation purposes. The paracentesis was performed. The catheter was removed and a dressing was applied. The patient tolerated the procedure well without immediate post procedural complication. FINDINGS: A total of approximately 3.6 L of serosanguineous fluid was removed. Samples were sent to the laboratory as requested by the clinical team. IMPRESSION: Successful ultrasound-guided paracentesis yielding 3.6 liters of peritoneal fluid. Performed by Lynnette Caffey, PA-C Electronically Signed   By: Olive Bass M.D.   On: 04/30/2023 15:25   US Paracentesis  Result Date: 04/27/2023 INDICATION: History of cirrhosis with recurrent ascites. Patient presents today for diagnostic and therapeutic paracentesis with 5 L maximum. EXAM: ULTRASOUND GUIDED diagnostic and therapeutic PARACENTESIS MEDICATIONS: None. COMPLICATIONS: None immediate. PROCEDURE: Informed written consent was obtained from the patient after a discussion of the risks, benefits and alternatives to treatment. A timeout was performed prior to the initiation of the procedure. Initial ultrasound scanning demonstrates a large amount of ascites within the right lower abdominal quadrant. The right lower abdomen was prepped and draped in the usual sterile fashion. 1% lidocaine was used for local anesthesia. Following this, a 19 gauge, 7-cm, Yueh catheter was introduced. An ultrasound image was saved for documentation purposes. The paracentesis was performed. The catheter was removed and a dressing was applied. The patient tolerated the procedure well without immediate post procedural complication. Patient received post-procedure intravenous albumin; see nursing notes for details. FINDINGS: A total of approximately 5 L of clear yellow fluid was removed. Samples were sent to the laboratory as requested by the clinical team. IMPRESSION: Successful ultrasound-guided  paracentesis yielding 5 liters of peritoneal fluid. PLAN: The patient has previously been formally evaluated by the [facility] Interventional Radiology Portal Hypertension Clinic and is being actively followed for potential future intervention. TIPS consultation completed 03/18/2023 by Dr. Grace Isaac. TIPS procedure pending echocardiogram. Procedure performed by Mina Marble, PA-C Electronically Signed   By: Irish Lack M.D.   On: 04/27/2023 14:28   US Paracentesis  Result Date: 04/23/2023 INDICATION: ascites Briefly, 42 year old female with a history of EtOH cirrhosis and refractory ascites. TIPS consultation completed 03/18/2023, pending procedure. EXAM: ULTRASOUND GUIDED DIAGNOSTIC AND THERAPEUTIC PARACENTESIS MEDICATIONS: 10 mL lidocaine 1% COMPLICATIONS: None immediate. PROCEDURE: Informed written consent was obtained from the patient after a discussion of the risks, benefits and alternatives to treatment. A timeout was performed prior to the initiation of the procedure. Initial ultrasound scanning demonstrates a large amount of ascites within the right lower abdominal quadrant. The right lower abdomen was prepped and draped in the usual sterile fashion. 1% lidocaine was used for local anesthesia. Following this, a 6 Fr Safe-T-Centesis catheter was introduced. An ultrasound image was saved for documentation purposes. The paracentesis was performed. The catheter was removed and a dressing was applied. The patient tolerated the procedure well without immediate post procedural complication. Patient received post-procedure intravenous albumin; see nursing notes for details. FINDINGS: A total of approximately 5 L of serous peritoneal fluid was removed. Samples were sent  to the laboratory as requested by the clinical team. IMPRESSION: Successful ultrasound-guided paracentesis yielding 5 L of peritoneal fluid. PLAN: The patient has previously been formally evaluated by the [facility] Interventional Radiology Portal  Hypertension Clinic and is being actively followed for potential future intervention. TIPS consultation completed 03/18/2023 by Dr. Grace Isaac. Procedure pending echocardiogram. Roanna Banning, MD [facility] Electronically Signed   By: Roanna Banning M.D.   On: 04/23/2023 11:46   US Paracentesis  Result Date: 04/16/2023 INDICATION: ascites Briefly, 42 year old female with a history of EtOH cirrhosis with refractory ascites. TIPS consultation completed 03/18/2023, pending procedure EXAM: ULTRASOUND GUIDED DIAGNOSTIC and THERAPEUTIC PARACENTESIS MEDICATIONS: 10 mL lidocaine 1% COMPLICATIONS: None immediate. PROCEDURE: Informed written consent was obtained from the patient after a discussion of the risks, benefits and alternatives to treatment. A timeout was performed prior to the initiation of the procedure. Initial ultrasound scanning demonstrates a large amount of ascites within the right lower abdominal quadrant. The right lower abdomen was prepped and draped in the usual sterile fashion. 1% lidocaine was used for local anesthesia. Following this, a 19 gauge, 7-cm, Yueh catheter was introduced. An ultrasound image was saved for documentation purposes. The paracentesis was performed. The catheter was removed and a dressing was applied. The patient tolerated the procedure well without immediate post procedural complication. Patient received post-procedure intravenous albumin; see nursing notes for details. FINDINGS: A total of approximately 5 L of serous peritoneal fluid was removed. Samples were sent to the laboratory as requested by the clinical team. IMPRESSION: Successful ultrasound-guided paracentesis yielding 5 L of peritoneal fluid. PLAN: The patient has previously been formally evaluated by the [facility] Interventional Radiology Portal Hypertension Clinic and is being actively followed for potential future intervention. TIPS consultation completed  03/18/2023 by Dr. Grace Isaac. Procedure pending echocardiogram. Roanna Banning, MD [facility] Electronically Signed   By: Roanna Banning M.D.   On: 04/16/2023 10:17   US Paracentesis  Result Date: 04/13/2023 INDICATION: 42 y.o. Female. History of cirrhosis with recurrent ascites. Patient presents for therapeutic and diagnotic paracentesis. Maximum 5 Liters. EXAM: ULTRASOUND GUIDED THERAPEUTIC AND DIAGNOSTIC PARACENTESIS MEDICATIONS: Lidocaine 1% - 10 ml's COMPLICATIONS: None immediate. PROCEDURE: Informed written consent was obtained from the patient after a discussion of the risks, benefits and alternatives to treatment. A timeout was performed prior to the initiation of the procedure. Initial ultrasound scanning demonstrates a large amount of ascites within the right lower abdominal quadrant. The right lower abdomen was prepped and draped in the usual sterile fashion. 1% lidocaine was used for local anesthesia. Following this, a 19 gauge, 7-cm, Yueh catheter was introduced. An ultrasound image was saved for documentation purposes. The paracentesis was performed. The catheter was removed and a dressing was applied. The patient tolerated the procedure well without immediate post procedural complication. Patient received post-procedure intravenous albumin; see nursing notes for details. FINDINGS: A total of approximately 5 L of pale yellow fluid was removed. Samples were sent to the laboratory as requested by the clinical team. IMPRESSION: Successful ultrasound-guided therapeutic and diagnostic paracentesis yielding 5 liters of peritoneal fluid. Performed by: Anders Grant NP PLAN: The patient has previously been evaluated by the [facility] Interventional Radiology Portal Hypertension Clinic, and deemed not a candidate for intervention. See note from IR Attending Dr. Odis Luster. Patient scheduled for ECHO on 6.28.24 Electronically Signed   By: Lupita Raider  M.D.   On: 04/13/2023 12:43  [2 weeks]  Assessment:   42 year old  female with PMH of anxiety, depression, decompensated alcoholic cirrhosis, PBC,  alcoholic hepatitis December 2023, HE, recurrent ascites requiring frequent paras, chronic anemia with recurrent hospitalzations due to acute on chronic anemia, most recent admission in May 2024 with acute on chronic anemia and AKI, presenting to the ED due to outpatient labs with worsening anemia, AKI   Acute on chronic anemia: ongoing.  Frequent hospitalizations, most recent in May with hemoglobin down to 7, no evidence of iron, B12, folate deficiency.  Recent outpatient labs with hemoglobin of 6.3.  Hemoglobin 5.8 on arrival to the ED.  She denies GI bleeding.  FOBT negative in the ED.  She has reported low-volume intermittent vaginal bleeding.  Etiology of acute on chronic anemia is unclear.  has never had a colonoscopy. Anemia could be secondary to kidney and liver disease, oozing from PHG, and vaginal bleeding could be contributing, she failed to follow up with GYN as previously recommended. Last EGD in April. Will continue with PPI BID, trending H&H, transfusions as needed. She received 2 units PRBCs this admission, H/H stable.   She will need colonoscopy at some point, likely as outpatient unless she has overt GI bleeding. Recommend GYN evaluation for ongoing vaginal bleeding.   AKI/?Hepatorenal syndrome:  Cr previously normal, up to 5.17 on admission, 4.64 today.  Likely multifactorial in the setting of hypotension outpatient (nadolol placed on hold yesterday), frequent paras, diuretics, frequent BMs related to lactulose, and recent nausea and vomiting.  Diuretics currently on hold.  Started on IV albumin Q8H x 3 doses and midodrine 5 mg TID.  Nephrology involved, recommended continued hold of diuretics, continued albumin today.    Nausea/vomiting: etiology unclear, likely multifactorial. Has chronic LLQ pain, unclear if this is related to her N/v.  Consider CT A/P w/o contrast if symptoms persist. Patient requesting to hold Xifaxan as she believes it is contributing.    Weight loss: down 13 pounds in the past month. Likely secondary to decreased PO intake/nausea/vomiting, recent EGD as above, no TCS. Given multiple ongoing problems this admission, no overt bleeding can plan to pursue outpatient colonoscopy for further evaluation.    Decompensated cirrhosis: Secondary to EtOH and PBC.  No EtOH since November 2023.  Cirrhosis has been complicated with recurrent ascites, HE previously, coagulopathy with elevated INR. She is undergoing TIPS evaluation.  Has been referred to [facility] for transplant evaluation, no appointment scheduled yet.   She has recurrent ascites in setting of noncompliance with 2 g sodium diet, hypoalbuminemia.  On Lasix 40 mg BID only as outpatient, she stopped spironolactone 2 weeks ago due intolerance of taste.  Has been undergoing frequent paracentesis up to biweekly recently.  No true history of SBP. Last para 6/14 without SBP. ascites on exam today but not tense, she feels ascites is about the same as it has been.  Diuretics are on hold in setting of AKI.nephrology on board.   History of HE, ammonia mildly elevated at 76 yesterday but not appearing to clinically be encephalopathic.  No asterixis.  Taking lactulose only as outpatient, having 5 BMs per day.  She stopped Xifaxan 2 weeks ago due to intolerance of taste.  Will continue lactulose for now. Patient requesting to hold xifaxan as she thinks she vomited after taking this two nights ago.    History of EV's.  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  Started on nadolol at that time.  Nadolol placed on hold yesterday due to hypotension.  Will consider resuming  BB once AKI has improved and BP is stable.   MELD 3.0-- 36 yesterday.      Plan:   Daily labs for MELD 3.0. Monitor for overt bleeding. PPI BID. Colonoscopy as outpatient,  could consider repeat EGD if ongoing vomiting.  2 gram sodium restricted diet.  Continue lactulose.  Outpatient appt for [facility] evaluation.  Outpatient gyn evaluation. Hold Xifaxan.  Appreciate nephrology input.  Holding diuretics and nadolol until BP/AKI improved.   LOS: 2 days   Leanna Battles. Dixon Boos [facility] [phone] 6/20/20248:11 AM

## 2023-05-06 NOTE — Progress Notes (Signed)
Progress Note   Patient: Mallory Mendoza:[id number] DOB: 12/26/1980 DOA: 05/04/2023     2 DOS: the patient was seen and examined on 05/06/2023   Brief hospital admission narrative course: As per H&P written by Dr. Sherryll Burger on 05/05/2023 Mallory Mendoza is a 42 y.o. female with medical history significant for alcoholic liver cirrhosis with recurrent ascites requiring frequent paracentesis, recurrent GI bleed, history of anxiety and depression, chronic anemia, and recent discharge for menorrhagia with acute on chronic anemia requiring 1 unit PRBC transfusion who presents to the ED with abnormal labs that were drawn by GI.  She was noted to have a hemoglobin of 5.8 and worsening creatinine levels up to 5.17.  It appears that her creatinine has recently been upward trending on last admission up to around 2-2.5.  She is also noted to have chronic hyponatremia.  She complains of feeling somewhat fatigued and has chronic abdominal pain related to her abdominal distention.  She continues to have some mild vaginal bleeding and has not followed up with gynecology since her recent discharge and is not currently on Megace.  Additionally she has been complaining of shivering and chills and feels quite cold and uses several blankets every day to help warm up.   ED Course: Vital signs stable and patient is afebrile.  Hemoglobin 5.8 with 2 units PRBCs ordered.  Creatinine 5.17 with some metabolic acidosis discussion had with nephrology with no emergent need for hemodialysis, but they will follow and routine consultation.  GI to follow inpatient.  Ammonia is 76, but she is mentating well.  Stool occult study negative.  Assessment and Plan: 1-acute on chronic symptomatic anemia -In the setting of ongoing vaginal bleeding -Status post 2 unit PRBC transfusion -Fecal occult blood test negative -Continue patient follow-up with Dr. Despina Hidden as an outpatient -if needed will give depo shot IM -Hemoglobin 7.9 and is stable,  continue to monitor.  2-worsening acute kidney injury with metabolic acidosis -With concern for hepatorenal syndrome -Continue to follow nephrology service recommendations. -Continue to maintain adequate hydration -Continue to avoid nephrotoxic agent -Continue sodium bicarbonate, PPI, octreotide and albumin -Follow renal function trend. -Fluid restriction to 1.5 L daily  3-alcoholic liver cirrhosis with portal hypertension and recurring ascites -Echo will be done to facilitate/accelerate TIPS procedure as an outpatient; will follow results. -Continue to follow GI service recommendation -Continue PPI -Continue lactulose and rifaximin -Normal mentation at the moment. -Outpatient endoscopic evaluation anticipated. -No signs of active bleeding currently.  4-chronic pain syndrome -Continue as needed analgesics -Continue to avoid the use of NSAIDs.  5-hyponatremia -Appears to be chronic in the setting of renal failure and liver cirrhosis with chronic diuretic usage -Continue to follow electrolytes trend and follow nephrology recommendations. -Serum level 128 currently  6-history of anxiety/depression -Overall stable mood -No suicidal ideation or hallucination -Continue home anxiolytic/antidepressant regimen.  7-tobacco abuse -Cessation counseling provided -Continue nicotine patch. -Patient was receptive to counseling  8-insomnia -As needed Restoril has been ordered. -Continue to follow response.  Subjective:  No fever, no chest pain, no nausea or vomiting.  Still reporting intermittent abdominal pain and generalized weakness.  Physical Exam: Vitals:   05/06/23 0100 05/06/23 0500 05/06/23 0712 05/06/23 0736  BP: (!) 116/59  (!) 91/49   Pulse: 96  94   Resp: 13  15   Temp:  97.7 F (36.5 C)  97.6 F (36.4 C)  TempSrc:  Oral  Oral  SpO2: 99%  98%   Weight:  Height:       General exam: Alert, awake, following commands appropriately and in no major distress;  patient reports better night sleep and decrease in her itching.  No dysuria or hematuria.  Continues to have intermittent abdominal pain. Respiratory system: Clear to auscultation. Respiratory effort normal.  Good saturation on room air. Cardiovascular system:RRR. No rubs or gallops; no JVD. Gastrointestinal system: Abdomen is distended and with positive ascites; positive bowel sounds and no guarding. Central nervous system: Alert and oriented. No focal neurological deficits. Extremities: No cyanosis, clubbing or edema. Skin: No petechiae. Psychiatry: Judgement and insight appear normal.  Flat affect appreciated.  Data Reviewed: Magnesium: 2.0 CBC: WBCs 10.5, hemoglobin 7.9 and platelet count 178 K Renal function panel: Sodium 128, potassium 3.4, chloride 102, bicarb 12, BUN 43, creatinine 4.42 and GFR 12 Phosphorus: 5.1  Family Communication: Mother at bedside.  Disposition: Status is: Inpatient Remains inpatient appropriate because: Continue treatment for acute on chronic renal failure.   Planned Discharge Destination: Home   Time spent: 50 minutes  Author: Vassie Loll, MD 05/06/2023 8:27 AM  For on call review [email or web address].

## 2023-05-06 NOTE — Progress Notes (Signed)
Pt had a recurrent vomiting episode that was unwitnessed by the nurse some where between 1300-1330, but found the evidence in the emesis bag in the trash can, it was clear yellow bile like the previous episode, nurse notified GI team, no new orders at this time.

## 2023-05-06 NOTE — Progress Notes (Signed)
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 No obstruction on reanl u/s -cont to hold hold lasix. 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 C/w midodrine & octreotide -will check ANCA, ANA, C3,C4, anti dsDNA Ab, anti-GBM given rather significant microscopic hematuria (this is probably a sequelae of her menorrhagia)--pending -cr 4.4 this AM, repeat 4.6 later in the morning Avoid nephrotoxic medications including NSAIDs and iodinated intravenous contrast exposure unless the latter is absolutely indicated.  Preferred narcotic agents for pain control are hydromorphone, fentanyl, and methadone. Morphine should not be used. Avoid Baclofen and avoid oral sodium phosphate and magnesium citrate based laxatives / bowel preps. Continue strict Input and Output monitoring. Will monitor the patient closely with you and intervene or adjust therapy as indicated by changes in clinical status/labs    Acute on chronic anemia -transfuse prn for Hgb <7 -GI following, open for EGD, c-scope   Decompensated cirrhosis -GI following -if paracentesis is to be performed then would recommend not removing more than 4L and would recommend giving albumin with para's   Hypotension -on midodrine 5mg  TID & octreotide. Increased midodrine to 10mg  daily given MAPs are not at goal   Hypokalemia -replete PRN   Hyponatremia -acute on chronic in the context of AKI and cirrhosis. Na up to 129 today. Would recommend fluids restriction of 1.5L/day for now   Metabolic acidosis -likely multifactorial: AKI and decreased lactate clearance from cirrhosis -currently on nahco3 650mg  TID, increased to 1300mg  TID  Subjective:   Patient seen and examined bedside in ICU. S/p barium swallow, has some nausea/vomiting post procedure. Trying to keep hydrated and trying  to eat. She reports that she is urinating a little bit. No other acute events.   Objective:   BP (!) 95/59   Pulse 98   Temp 97.6 F (36.4 C) (Oral)   Resp 10   Ht 5\' 4"  (1.626 m)   Wt 53.2 kg   LMP 04/26/2023 (Approximate)   SpO2 100%   BMI 20.13 kg/m   Intake/Output Summary (Last 24 hours) at 05/06/2023 1137 Last data filed at 05/06/2023 0841 Gross per 24 hour  Intake 992.11 ml  Output 250 ml  Net 742.11 ml   Weight change:   Physical Exam: Gen: NAD, sitting up in bed CVS: RRR Resp: normal wob Abd: distended, TTP lower quadrants Ext: no edema Neuro: awake, alert  Imaging: DG ESOPHAGUS W SINGLE CM (SOL OR THIN BA)  Result Date: 05/06/2023 CLINICAL DATA:  42 yo female with history of ETOH cirrhosis, recurrent GI bleed, anxiety, depression and menorrhagia admitted for anemia and AKI. Pt complains of worsening solid food and medication dysphagia. She denies previous esophagus or stomach surgeries. Pt referred for fluoroscopic esophagram study. EXAM: ESOPHAGUS/BARIUM SWALLOW/TABLET STUDY TECHNIQUE: Single contrast examination was performed using water soluble contrast. This exam was performed by Alex Gardener, NP, and was supervised and interpreted by Roanna Banning, MD. FLUOROSCOPY: Radiation Exposure Index (as provided by the fluoroscopic device): 20.20 mGy Kerma COMPARISON:  None Available. FINDINGS: Exam limited due to patients inability to stand or position independently on exam table. Swallowing: Appears normal. No vestibular penetration or aspiration seen. Pharynx: Unremarkable. Esophagus: Normal appearance. Esophageal motility: Within normal limits. Hiatal Hernia: None. Gastroesophageal reflux: Spontaneous trace gastric reflux to distal esophagus without provocation Ingested 13mm barium tablet: Pt states she could not swallow tablet. Other:  None. IMPRESSION: Exam limited due to patients inability to stand and position independently on exam table. Trace gastroesophageal reflux to  distal esophagus. Otherwise, normal esophagram. Electronically Signed   By: Roanna Banning M.D.   On: 05/06/2023 11:17   US RENAL  Result Date: 05/05/2023 CLINICAL DATA:  Acute kidney injury EXAM: RENAL / URINARY TRACT ULTRASOUND COMPLETE COMPARISON:  Ascites ultrasound 04/30/2019. CT chest abdomen pelvis 12/20/2018 FINDINGS: Right Kidney: Renal measurements: 11.2 x 4.2 x 6.0 cm = volume: 148.4 mL. Echogenicity within normal limits. No mass or hydronephrosis visualized. Left Kidney: Renal measurements: 10.4 x 5.4 x 5.3 cm = volume: 156.9 mL. Echogenicity within normal limits. No mass or hydronephrosis visualized. Bladder: Bladder is poorly seen. Other: Diffuse ascites. IMPRESSION: No collecting system dilatation.  Diffuse ascites. Electronically Signed   By: Karen Kays M.D.   On: 05/05/2023 13:52    Labs: BMET Recent Labs  Lab 05/04/23 0837 05/04/23 1217 05/05/23 0515 05/06/23 0410 05/06/23 0935  NA 129* 125* 128* 128* 129*  K 3.1* 3.1* 2.7* 3.4* 3.2*  CL 105 102 101 102 100  CO2 11* 10* 12* 12* 12*  GLUCOSE 113* 129* 111* 130* 114*  BUN 42* 43* 43* 43* 42*  CREATININE 5.20* 5.17* 4.59* 4.42* 4.64*  CALCIUM 7.8* 7.5* 8.2* 8.3* 9.0  PHOS  --   --   --  5.1* 5.1*   CBC Recent Labs  Lab 05/04/23 0837 05/04/23 1217 05/04/23 2005 05/05/23 0515 05/06/23 0410  WBC 12.4* 13.0*  --  9.8 10.5  NEUTROABS 8.9*  --   --   --   --   HGB 6.3* 5.8* 6.6* 7.8* 7.9*  HCT 18.2* 17.6* 19.1* 22.9* 23.5*  MCV 85.0 88.0  --  87.4 87.7  PLT 247 244  --  196 178    Medications:     sodium chloride   Intravenous Once   sodium chloride   Intravenous Once   Chlorhexidine Gluconate Cloth  6 each Topical Daily   feeding supplement  237 mL Oral BID BM   folic acid  1 mg Oral Daily   lactulose  30 g Oral BID   midodrine  10 mg Oral TID WC   mirtazapine  7.5 mg Oral QHS   multivitamin with minerals  1 tablet Oral Daily   nicotine  14 mg Transdermal Daily   octreotide  100 mcg Subcutaneous TID    pantoprazole  40 mg Oral BID   rifaximin  550 mg Oral BID   sodium bicarbonate  1,300 mg Oral TID   sodium citrate-citric acid  30 mL Oral Q12H   ursodiol  300 mg Oral TID      Anthony Sar, MD [facility] 05/06/2023, 11:37 AM

## 2023-05-06 NOTE — Progress Notes (Signed)
Nurse notified the hospitalist and GI team that patient had a large amount of clear yellow vomit episode without any medication or food/drink inducing it and that pt also had a vomiting episode last night per night shift nurse, GI team would like to be notified again if pt has any additional episodes of vomiting.

## 2023-05-07 ENCOUNTER — Encounter (HOSPITAL_COMMUNITY): Payer: Self-pay

## 2023-05-07 ENCOUNTER — Inpatient Hospital Stay (HOSPITAL_COMMUNITY): Payer: Medicaid Other

## 2023-05-07 ENCOUNTER — Ambulatory Visit (HOSPITAL_COMMUNITY)
Admission: RE | Admit: 2023-05-07 | Discharge: 2023-05-07 | Disposition: A | Discharge status: Home / Self Care | Payer: Medicaid Other | Source: Ambulatory Visit | Attending: Gastroenterology | Admitting: Gastroenterology

## 2023-05-07 DIAGNOSIS — K7682 Hepatic encephalopathy: Secondary | ICD-10-CM | POA: Diagnosis not present

## 2023-05-07 DIAGNOSIS — K767 Hepatorenal syndrome: Secondary | ICD-10-CM | POA: Diagnosis not present

## 2023-05-07 DIAGNOSIS — D649 Anemia, unspecified: Secondary | ICD-10-CM | POA: Diagnosis not present

## 2023-05-07 DIAGNOSIS — K7031 Alcoholic cirrhosis of liver with ascites: Secondary | ICD-10-CM | POA: Diagnosis not present

## 2023-05-07 DIAGNOSIS — K766 Portal hypertension: Secondary | ICD-10-CM | POA: Diagnosis not present

## 2023-05-07 LAB — COMPREHENSIVE METABOLIC PANEL
ALT: 11 U/L (ref 0–44)
AST: 16 U/L (ref 15–41)
Albumin: 4.5 g/dL (ref 3.5–5.0)
Alkaline Phosphatase: 54 U/L (ref 38–126)
Anion gap: 14 (ref 5–15)
BUN: 44 mg/dL — ABNORMAL HIGH (ref 6–20)
CO2: 13 mmol/L — ABNORMAL LOW (ref 22–32)
Calcium: 9.3 mg/dL (ref 8.9–10.3)
Chloride: 102 mmol/L (ref 98–111)
Creatinine, Ser: 5.65 mg/dL — ABNORMAL HIGH (ref 0.44–1.00)
GFR, Estimated: 9 mL/min — ABNORMAL LOW (ref >60)
Glucose, Bld: 102 mg/dL — ABNORMAL HIGH (ref 70–99)
Potassium: 3.1 mmol/L — ABNORMAL LOW (ref 3.5–5.1)
Sodium: 129 mmol/L — ABNORMAL LOW (ref 135–145)
Total Bilirubin: 2.2 mg/dL — ABNORMAL HIGH (ref 0.3–1.2)
Total Protein: 6 g/dL — ABNORMAL LOW (ref 6.5–8.1)

## 2023-05-07 LAB — GLUCOSE, PLEURAL OR PERITONEAL FLUID: Glucose, Fluid: 102 mg/dL

## 2023-05-07 LAB — ENA+DNA/DS+ANTICH+CENTRO+JO...
Anti JO-1: <0.2 AI (ref 0.0–0.9)
Centromere Ab Screen: <0.2 AI (ref 0.0–0.9)
Chromatin Ab SerPl-aCnc: <0.2 AI (ref 0.0–0.9)
ENA SM Ab Ser-aCnc: 1.6 AI — ABNORMAL HIGH (ref 0.0–0.9)
Ribonucleic Protein: <0.2 AI (ref 0.0–0.9)
SSA (Ro) (ENA) Antibody, IgG: <0.2 AI (ref 0.0–0.9)
SSB (La) (ENA) Antibody, IgG: <0.2 AI (ref 0.0–0.9)
Scleroderma (Scl-70) (ENA) Antibody, IgG: <0.2 AI (ref 0.0–0.9)
ds DNA Ab: <1 IU/mL (ref 0–9)

## 2023-05-07 LAB — CBC
HCT: 21.2 % — ABNORMAL LOW (ref 36.0–46.0)
Hemoglobin: 7.4 g/dL — ABNORMAL LOW (ref 12.0–15.0)
MCH: 30 pg (ref 26.0–34.0)
MCHC: 34.9 g/dL (ref 30.0–36.0)
MCV: 85.8 fL (ref 80.0–100.0)
Platelets: 167 10*3/uL (ref 150–400)
RBC: 2.47 MIL/uL — ABNORMAL LOW (ref 3.87–5.11)
RDW: 14.8 % (ref 11.5–15.5)
WBC: 9.6 10*3/uL (ref 4.0–10.5)
nRBC: 0 % (ref 0.0–0.2)

## 2023-05-07 LAB — C3 COMPLEMENT: C3 Complement: 49 mg/dL — ABNORMAL LOW (ref 82–167)

## 2023-05-07 LAB — PROTIME-INR
INR: 2.1 — ABNORMAL HIGH (ref 0.8–1.2)
Prothrombin Time: 24 seconds — ABNORMAL HIGH (ref 11.4–15.2)

## 2023-05-07 LAB — BODY FLUID CELL COUNT WITH DIFFERENTIAL
Eos, Fluid: 0 %
Lymphs, Fluid: 22 %
Monocyte-Macrophage-Serous Fluid: 57 % (ref 50–90)
Neutrophil Count, Fluid: 21 % (ref 0–25)
Total Nucleated Cell Count, Fluid: 152 cu mm (ref 0–1000)

## 2023-05-07 LAB — GRAM STAIN: Gram Stain: NONE SEEN

## 2023-05-07 LAB — LACTATE DEHYDROGENASE, PLEURAL OR PERITONEAL FLUID: LD, Fluid: 26 U/L — ABNORMAL HIGH (ref 3–23)

## 2023-05-07 LAB — C4 COMPLEMENT: Complement C4, Body Fluid: 10 mg/dL — ABNORMAL LOW (ref 12–38)

## 2023-05-07 LAB — AMMONIA: Ammonia: 153 umol/L — ABNORMAL HIGH (ref 9–35)

## 2023-05-07 LAB — ANA W/REFLEX IF POSITIVE: Anti Nuclear Antibody (ANA): POSITIVE — AB

## 2023-05-07 MED ORDER — MIDODRINE HCL 5 MG PO TABS
15.0000 mg | ORAL_TABLET | Freq: Three times a day (TID) | ORAL | Status: DC
  Administered 2023-05-07 – 2023-05-10 (×11): 15 mg via ORAL
  Filled 2023-05-07 (×12): qty 3

## 2023-05-07 MED ORDER — HYDROMORPHONE HCL 1 MG/ML IJ SOLN
0.5000 mg | INTRAMUSCULAR | Status: DC | PRN
  Administered 2023-05-08: 0.5 mg via INTRAVENOUS
  Filled 2023-05-07 (×2): qty 0.5

## 2023-05-07 MED ORDER — LACTULOSE 10 GM/15ML PO SOLN
30.0000 g | Freq: Three times a day (TID) | ORAL | Status: DC
  Administered 2023-05-07 – 2023-05-10 (×10): 30 g via ORAL
  Filled 2023-05-07 (×10): qty 60

## 2023-05-07 MED ORDER — ZINC SULFATE 220 (50 ZN) MG PO CAPS
220.0000 mg | ORAL_CAPSULE | Freq: Every day | ORAL | Status: DC
  Administered 2023-05-07 – 2023-05-10 (×4): 220 mg via ORAL
  Filled 2023-05-07 (×4): qty 1

## 2023-05-07 MED ORDER — OCTREOTIDE ACETATE 100 MCG/ML IJ SOLN
200.0000 ug | Freq: Three times a day (TID) | INTRAMUSCULAR | Status: DC
  Administered 2023-05-07 – 2023-05-10 (×11): 200 ug via SUBCUTANEOUS
  Filled 2023-05-07 (×16): qty 2

## 2023-05-07 MED ORDER — POTASSIUM CHLORIDE CRYS ER 20 MEQ PO TBCR
40.0000 meq | EXTENDED_RELEASE_TABLET | Freq: Once | ORAL | Status: AC
  Administered 2023-05-07: 40 meq via ORAL
  Filled 2023-05-07: qty 2

## 2023-05-07 MED ORDER — ALBUMIN HUMAN 25 % IV SOLN
25.0000 g | Freq: Four times a day (QID) | INTRAVENOUS | Status: AC
  Administered 2023-05-07 – 2023-05-09 (×8): 25 g via INTRAVENOUS
  Filled 2023-05-07 (×8): qty 100

## 2023-05-07 MED ORDER — LIDOCAINE HCL (PF) 2 % IJ SOLN
INTRAMUSCULAR | Status: AC
  Filled 2023-05-07: qty 10

## 2023-05-07 NOTE — Progress Notes (Signed)
[name] Progress Note    Assessment/ Plan:   AKI -suspecting this is secondary to HRS but hypotension/diuretics/acute anemia could also be culprits. 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  Does not really change plan - leading thought as above is HRS-  on midodrine and octreotide-  will inc doses a little and also renal function had worsened when albumin was stopped so will resume No indications for dialysis but trend is not good-  if needed HD would likely need CRRT given low BP-  usually dialysis is not offered in these cases if not a bridge to transplant as prognosis is poor    Acute on chronic anemia -transfuse prn for Hgb <7 -GI following, open for EGD, c-scope Will add ESA   Decompensated cirrhosis -GI following -if paracentesis is to be performed then would recommend not removing more than 4L and would recommend giving albumin with para's I calcultate MELD at 33 giving 3 month mortality at over 50%-  has not established with transplant-  has palliative seen ?    Hypotension -on midodrine 10mg  TID & octreotide. Increased midodrine to 15mg  tid given MAPs are not at goal   Hypokalemia -replete PRN-  give 40 today    Hyponatremia -acute on chronic in the context of AKI and cirrhosis. Na 129 today. Would recommend fluids restriction of 1.5L/day for now   Metabolic acidosis -likely multifactorial: AKI and decreased lactate clearance from cirrhosis -currently on bicarb  1300mg  TID  Pt will not be physically seen over the weekend-  Dr. Signe Colt is on call-  she will review labs and make recommendations -  call her with questions     Subjective:   UOP not well recorded-  BP soft-  renal function worsening -  complements are low but other serologies are negative.  Patient very disinterested in what I have to say-  grunts responses-   keeps eyes closed    Objective:   BP (!) 95/56   Pulse 92   Temp 98.2 F (36.8 C)   Resp 18   Ht 5\' 4"  (1.626 m)   Wt 53.2 kg   LMP 04/26/2023 (Approximate)   SpO2 100%   BMI 20.13 kg/m   Intake/Output Summary (Last 24 hours) at 05/07/2023 8295 Last data filed at 05/07/2023 0500 Gross per 24 hour  Intake 440 ml  Output 200 ml  Net 240 ml   Weight change:   Physical Exam: Gen: NAD, sitting up in bed CVS: RRR Resp: normal wob Abd: distended, TTP lower quadrants Ext: no edema Neuro: awake, alert  Imaging: CT ABDOMEN PELVIS WO CONTRAST  Result Date: 05/06/2023 CLINICAL DATA:  Abdominal pain and distention. EXAM: CT ABDOMEN AND PELVIS WITHOUT CONTRAST TECHNIQUE: Multidetector CT imaging of the abdomen and pelvis was performed following the standard protocol without IV contrast. RADIATION DOSE REDUCTION: This exam was performed according to the departmental dose-optimization program which includes automated exposure control, adjustment of the mA and/or kV according to patient size and/or use of iterative reconstruction technique. COMPARISON:  December 20, 2022. FINDINGS: Lower chest: Small left pleural effusion is noted. Hepatobiliary: No cholelithiasis or biliary dilatation is noted. Slightly nodular hepatic margins are noted suggesting hepatic cirrhosis. Pancreas: Unremarkable. No pancreatic ductal dilatation or surrounding inflammatory changes. Spleen: Normal in size without focal abnormality. Adrenals/Urinary Tract: Adrenal glands are unremarkable. Kidneys are normal, without  renal calculi, focal lesion, or hydronephrosis. Bladder is unremarkable. Stomach/Bowel: Stomach is within normal limits. Appendix appears normal. No evidence of bowel wall thickening, distention, or inflammatory changes. Vascular/Lymphatic: Aortic atherosclerosis. No enlarged abdominal or pelvic lymph nodes. Reproductive: Uterus and bilateral adnexa are unremarkable. Other: Moderate to severe ascites is noted.  No  hernia is noted. Musculoskeletal: No acute or significant osseous findings. IMPRESSION: Moderate to severe ascites is noted secondary to hepatic cirrhosis. Small left pleural effusion is noted. Aortic Atherosclerosis (ICD10-I70.0). Electronically Signed   By: Lupita Raider M.D.   On: 05/06/2023 18:08   ECHOCARDIOGRAM COMPLETE  Result Date: 05/06/2023    ECHOCARDIOGRAM REPORT   Patient Name:   Mallory Mendoza Date of Exam: 05/06/2023 Medical Rec #:  [id number]       Height:       64.0 in Accession #:    [id number]      Weight:       117.3 lb Date of Birth:  06/16/1981       BSA:          1.559 m Patient Age:    41 years        BP:           91/49 mmHg Patient Gender: F               HR:           94 bpm. [facility] Procedure: 2D Echo, Cardiac Doppler and Color Doppler Indications:    Other abnormalities of the heart R00.8  History:        Patient has prior history of Echocardiogram examinations, most                 recent 12/21/2022. Alcoholic cirrhosis of liver with ascites                 (HCC), Tobacco use disorder.  Sonographer:    Celesta Gentile RCS Referring Phys: [phone] CARLOS MADERA IMPRESSIONS  1. Left ventricular ejection fraction, by estimation, is 70 to 75%. The left ventricle has hyperdynamic function. The left ventricle has no regional wall motion abnormalities. There is mild left ventricular hypertrophy. Left ventricular diastolic parameters were normal.  2. Right ventricular systolic function is normal. The right ventricular size is normal. Tricuspid regurgitation signal is inadequate for assessing PA pressure.  3. The mitral valve is grossly normal. Trivial mitral valve regurgitation.  4. The aortic valve is tricuspid. Aortic valve regurgitation is not visualized. No aortic stenosis is present. Aortic valve mean gradient measures 8.0 mmHg.  5. The inferior vena cava is normal in size with greater than 50% respiratory variability, suggesting right atrial pressure of 3 mmHg.  Comparison(s): Prior images reviewed side by side. LVEF vigorous at 70-75%. FINDINGS  Left Ventricle: Left ventricular ejection fraction, by estimation, is 70 to 75%. The left ventricle has hyperdynamic function. The left ventricle has no regional wall motion abnormalities. The left ventricular internal cavity size was normal in size. There is mild left ventricular hypertrophy. Left ventricular diastolic parameters were normal. Right Ventricle: The right ventricular size is normal. No increase in right ventricular wall thickness. Right ventricular systolic function is normal. Tricuspid regurgitation signal is inadequate for assessing PA pressure. Left Atrium: Left atrial size was normal in size. Right Atrium: Right atrial size was normal in size. Pericardium: There is no evidence of pericardial effusion. Mitral Valve: The mitral valve is grossly normal. Trivial mitral valve regurgitation. Tricuspid Valve: The  tricuspid valve is grossly normal. Tricuspid valve regurgitation is mild. Aortic Valve: The aortic valve is tricuspid. Aortic valve regurgitation is not visualized. No aortic stenosis is present. Aortic valve mean gradient measures 8.0 mmHg. Aortic valve peak gradient measures 18.0 mmHg. Aortic valve area, by VTI measures 2.22  cm. Pulmonic Valve: The pulmonic valve was grossly normal. Pulmonic valve regurgitation is trivial. Aorta: The aortic root is normal in size and structure. Venous: The inferior vena cava is normal in size with greater than 50% respiratory variability, suggesting right atrial pressure of 3 mmHg. IAS/Shunts: No atrial level shunt detected by color flow Doppler.  LEFT VENTRICLE PLAX 2D LVIDd:         4.20 cm   Diastology LVIDs:         1.80 cm   LV e' medial:    8.70 cm/s LV PW:         1.10 cm   LV E/e' medial:  12.9 LV IVS:        1.00 cm   LV e' lateral:   11.40 cm/s LVOT diam:     2.00 cm   LV E/e' lateral: 9.8 LV SV:         77 LV SV Index:   49 LVOT Area:     3.14 cm  RIGHT  VENTRICLE RV S prime:     16.50 cm/s TAPSE (M-mode): 2.1 cm LEFT ATRIUM             Index        RIGHT ATRIUM           Index LA diam:        3.60 cm 2.31 cm/m   RA Area:     12.40 cm LA Vol (A2C):   38.9 ml 24.95 ml/m  RA Volume:   27.30 ml  17.51 ml/m LA Vol (A4C):   40.9 ml 26.23 ml/m LA Biplane Vol: 43.7 ml 28.02 ml/m  AORTIC VALVE AV Area (Vmax):    2.45 cm AV Area (Vmean):   2.42 cm AV Area (VTI):     2.22 cm AV Vmax:           212.00 cm/s AV Vmean:          120.000 cm/s AV VTI:            0.345 m AV Peak Grad:      18.0 mmHg AV Mean Grad:      8.0 mmHg LVOT Vmax:         165.00 cm/s LVOT Vmean:        92.600 cm/s LVOT VTI:          0.244 m LVOT/AV VTI ratio: 0.71  AORTA Ao Root diam: 3.00 cm MITRAL VALVE MV Area (PHT): 2.17 cm     SHUNTS MV Decel Time: 349 msec     Systemic VTI:  0.24 m MV E velocity: 112.00 cm/s  Systemic Diam: 2.00 cm MV A velocity: 92.20 cm/s MV E/A ratio:  1.21 Nona Dell MD Electronically signed by Nona Dell MD Signature Date/Time: 05/06/2023/3:48:03 PM    Final    DG ESOPHAGUS W SINGLE CM (SOL OR THIN BA)  Result Date: 05/06/2023 CLINICAL DATA:  42 yo female with history of ETOH cirrhosis, recurrent GI bleed, anxiety, depression and menorrhagia admitted for anemia and AKI. Pt complains of worsening solid food and medication dysphagia. She denies previous esophagus or stomach surgeries. Pt referred for fluoroscopic esophagram study. EXAM: ESOPHAGUS/BARIUM SWALLOW/TABLET STUDY TECHNIQUE: Single  contrast examination was performed using water soluble contrast. This exam was performed by Alex Gardener, NP, and was supervised and interpreted by Roanna Banning, MD. FLUOROSCOPY: Radiation Exposure Index (as provided by the fluoroscopic device): 20.20 mGy Kerma COMPARISON:  None Available. FINDINGS: Exam limited due to patients inability to stand or position independently on exam table. Swallowing: Appears normal. No vestibular penetration or aspiration seen. Pharynx:  Unremarkable. Esophagus: Normal appearance. Esophageal motility: Within normal limits. Hiatal Hernia: None. Gastroesophageal reflux: Spontaneous trace gastric reflux to distal esophagus without provocation Ingested 13mm barium tablet: Pt states she could not swallow tablet. Other: None. IMPRESSION: Exam limited due to patients inability to stand and position independently on exam table. Trace gastroesophageal reflux to distal esophagus. Otherwise, normal esophagram. Electronically Signed   By: Roanna Banning M.D.   On: 05/06/2023 11:17   US RENAL  Result Date: 05/05/2023 CLINICAL DATA:  Acute kidney injury EXAM: RENAL / URINARY TRACT ULTRASOUND COMPLETE COMPARISON:  Ascites ultrasound 04/30/2019. CT chest abdomen pelvis 12/20/2018 FINDINGS: Right Kidney: Renal measurements: 11.2 x 4.2 x 6.0 cm = volume: 148.4 mL. Echogenicity within normal limits. No mass or hydronephrosis visualized. Left Kidney: Renal measurements: 10.4 x 5.4 x 5.3 cm = volume: 156.9 mL. Echogenicity within normal limits. No mass or hydronephrosis visualized. Bladder: Bladder is poorly seen. Other: Diffuse ascites. IMPRESSION: No collecting system dilatation.  Diffuse ascites. Electronically Signed   By: Karen Kays M.D.   On: 05/05/2023 13:52    Labs: BMET Recent Labs  Lab 05/04/23 0837 05/04/23 1217 05/05/23 0515 05/06/23 0410 05/06/23 0935 05/07/23 0438  NA 129* 125* 128* 128* 129* 129*  K 3.1* 3.1* 2.7* 3.4* 3.2* 3.1*  CL 105 102 101 102 100 102  CO2 11* 10* 12* 12* 12* 13*  GLUCOSE 113* 129* 111* 130* 114* 102*  BUN 42* 43* 43* 43* 42* 44*  CREATININE 5.20* 5.17* 4.59* 4.42* 4.64* 5.65*  CALCIUM 7.8* 7.5* 8.2* 8.3* 9.0 9.3  PHOS  --   --   --  5.1* 5.1*  --    CBC Recent Labs  Lab 05/04/23 0837 05/04/23 1217 05/04/23 2005 05/05/23 0515 05/06/23 0410 05/07/23 0438  WBC 12.4* 13.0*  --  9.8 10.5 9.6  NEUTROABS 8.9*  --   --   --   --   --   HGB 6.3* 5.8* 6.6* 7.8* 7.9* 7.4*  HCT 18.2* 17.6* 19.1* 22.9* 23.5*  21.2*  MCV 85.0 88.0  --  87.4 87.7 85.8  PLT 247 244  --  196 178 167    Medications:     sodium chloride   Intravenous Once   sodium chloride   Intravenous Once   Chlorhexidine Gluconate Cloth  6 each Topical Daily   feeding supplement  237 mL Oral BID BM   folic acid  1 mg Oral Daily   lactulose  30 g Oral BID   midodrine  10 mg Oral TID WC   mirtazapine  7.5 mg Oral QHS   multivitamin with minerals  1 tablet Oral Daily   nicotine  14 mg Transdermal Daily   octreotide  100 mcg Subcutaneous TID   pantoprazole  40 mg Oral BID   rifaximin  550 mg Oral BID   sodium bicarbonate  1,300 mg Oral TID   sodium citrate-citric acid  30 mL Oral Q12H   ursodiol  300 mg Oral TID      Cecille Aver  [facility] 05/07/2023, 8:29 AM

## 2023-05-07 NOTE — Progress Notes (Addendum)
Patient returned from paracentesis at 1700 blood pressure 85/49 via vital sign machine, manually was 78/50 all other vital signs normal, noted patient had tremors. Patient reported she was cold. Research officer, political party at bedside. Patient given PRN Midodrine. MD Gwenlyn Perking made aware. No new orders.

## 2023-05-07 NOTE — Progress Notes (Signed)
Progress Note   Patient: Mallory Mendoza Mendoza ZOX:[id number] DOB: 06-21-1981 DOA: 05/04/2023     3 DOS: the patient was seen and examined on 05/07/2023   Brief hospital admission narrative course: As per H&P written by Dr. Sherryll Burger on 05/05/2023 Mallory Mendoza Mendoza is a 42 y.o. female with medical history significant for alcoholic liver cirrhosis with recurrent ascites requiring frequent paracentesis, recurrent GI bleed, history of anxiety and depression, chronic anemia, and recent discharge for menorrhagia with acute on chronic anemia requiring 1 unit PRBC transfusion who presents to the ED with abnormal labs that were drawn by GI.  She was noted to have a hemoglobin of 5.8 and worsening creatinine levels up to 5.17.  It appears that her creatinine has recently been upward trending on last admission up to around 2-2.5.  She is also noted to have chronic hyponatremia.  She complains of feeling somewhat fatigued and has chronic abdominal pain related to her abdominal distention.  She continues to have some mild vaginal bleeding and has not followed up with gynecology since her recent discharge and is not currently on Megace.  Additionally she has been complaining of shivering and chills and feels quite cold and uses several blankets every day to help warm up.   ED Course: Vital signs stable and patient is afebrile.  Hemoglobin 5.8 with 2 units PRBCs ordered.  Creatinine 5.17 with some metabolic acidosis discussion had with nephrology with no emergent need for hemodialysis, but they will follow and routine consultation.  GI to follow inpatient.  Ammonia is 76, but she is mentating well.  Stool occult study negative.  Assessment and Plan: 1-acute on chronic symptomatic anemia -In the setting of ongoing vaginal bleeding -Status post 2 unit PRBC transfusion -Fecal occult blood test negative -Continue patient follow-up with Dr. Despina Hidden as an outpatient -if needed will give depo shot IM -Hemoglobin 7.4 and is stable,  continue to monitor.  2-worsening acute kidney injury with metabolic acidosis -With concern for hepatorenal syndrome -Continue to follow nephrology service recommendations. -Continue to maintain adequate hydration -Continue to avoid nephrotoxic agent -Continue sodium bicarbonate, PPI, octreotide and albumin -Follow renal function trend. -Fluid restriction to 1.5 L daily -Patient's midodrine dose has been adjusted to 15 mg 3 times a day; will continue to follow nephrology service recommendation  3-alcoholic liver cirrhosis with portal hypertension and recurring ascites -Echo will be done to facilitate/accelerate TIPS procedure as an outpatient; will follow results. -Continue to follow GI service recommendation -Continue PPI -Continue lactulose and rifaximin -Normal mentation at the moment. -Outpatient endoscopic evaluation anticipated. -No signs of active bleeding currently. -Gastroenterology service with concern for SBP; paracentesis diagnostic has been requested.  Will follow results.  No fever, normal WBCs at the moment; holding on antibiotics currently.  4-chronic pain syndrome -Continue as needed analgesics -Continue to avoid the use of NSAIDs.  5-hyponatremia -Appears to be chronic in the setting of renal failure and liver cirrhosis with chronic diuretic usage -Continue to follow electrolytes trend and follow nephrology recommendations. -Serum level 128 currently  6-history of anxiety/depression -Overall stable mood -No suicidal ideation or hallucination -Continue home anxiolytic/antidepressant regimen.  7-tobacco abuse -Cessation counseling provided -Continue nicotine patch. -Patient was receptive to counseling  8-insomnia -Continue supportive care; holding on as needed Restoril given increased somnolence.  Subjective:  No fever, no chest pain, no nausea or vomiting.  Continue reporting intermittent abdominal discomfort.  Patient appears to be somnolent, slow to  respond and with positive accelerations.  Physical Exam: Vitals:  05/06/23 2143 05/07/23 0130 05/07/23 0503 05/07/23 1346  BP: (!) 95/57 102/64 (!) 95/56 100/66  Pulse: 97 95 92 90  Resp: 18 18 18 18   Temp: 98.3 F (36.8 C) 98.2 F (36.8 C) 98.2 F (36.8 C) 98.5 F (36.9 C)  TempSrc:  Oral  Oral  SpO2: 100% 100% 100% 100%  Weight:      Height:       General exam: Alert, awake, following commands but demonstrating increased somnolence.  Positive asterixis appreciated. No chest pain, no nausea, no vomiting.  Reporting abdominal discomfort. Respiratory system: Clear to auscultation. Respiratory effort normal.  Good saturation on room air. Cardiovascular system:RRR. No murmurs, rubs, gallops. Gastrointestinal system: Abdomen is distended, mildly tender to palpation and with positive bowel sounds.  No guarding.  Positive fluid wave appreciated. Central nervous system: Moving 4 limbs spontaneously; positive asterixis.  No focal neurological deficits. Extremities: No cyanosis or clubbing. Skin: No petechiae. Psychiatry: Flat affect appreciated.  Data Reviewed: CBC: White blood cell 9.6, hemoglobin 7.4 and platelet counts 167 K CMET: Sodium 129, potassium 3.1, chloride 102, bicarb 13, BUN 44, creatinine 5.65 stable LFTs.  GFR 9 total bilirubin 2.2   Family Communication: Mother at bedside.  Disposition: Status is: Inpatient Remains inpatient appropriate because: Continue treatment for acute on chronic renal failure.   Planned Discharge Destination: Home   Time spent: 50 minutes  Author: Vassie Loll, MD 05/07/2023 3:21 PM  For on call review [email or web address].

## 2023-05-07 NOTE — Progress Notes (Signed)
Gastroenterology Progress Note   Referring Provider: No ref. provider found Primary Care Physician:  Benetta Spar, MD Primary Gastroenterologist:  Dr. Marletta Lor  Patient ID: Mallory Mendoza; [id number]; [birth year]    Subjective   Patient very drowsy this morning but with some stimulation awoke to answer questions.  She denied any abdominal pain or nausea/vomiting this morning however she also has been sleeping and has not attempted to eat. She denies much urination. Had a small loose BM overnight she reported.   Objective   Vital signs in last 24 hours Temp:  [97.5 F (36.4 C)-98.3 F (36.8 C)] 98.2 F (36.8 C) (06/21 0503) Pulse Rate:  [92-99] 92 (06/21 0503) Resp:  [16-18] 18 (06/21 0503) BP: (95-115)/(55-64) 95/56 (06/21 0503) SpO2:  [100 %] 100 % (06/21 0503) Last BM Date : 05/06/23  Physical Exam General:   Drowsy and oriented, pleasant Head:  Normocephalic and atraumatic. Eyes:  Mild icterus, sclera clear. Conjuctiva pink.  Abdomen:  Bowel sounds present, soft, non-tender, mildly distended. No HSM or hernias noted.  Msk:  Symmetrical without gross deformities. Normal posture. Extremities:  Without clubbing or edema. Neurologic:  Drowsy and oriented x4;  asterixis present Psych:  Alert and cooperative. Normal mood and affect.  Intake/Output from previous day: 06/20 0701 - 06/21 0700 In: 440 [P.O.:240; IV Piggyback:200] Out: 200 [Emesis/NG output:200] Intake/Output this shift: No intake/output data recorded.  Lab Results  Recent Labs    05/05/23 0515 05/06/23 0410 05/07/23 0438  WBC 9.8 10.5 9.6  HGB 7.8* 7.9* 7.4*  HCT 22.9* 23.5* 21.2*  PLT 196 178 167   BMET Recent Labs    05/06/23 0410 05/06/23 0935 05/07/23 0438  NA 128* 129* 129*  K 3.4* 3.2* 3.1*  CL 102 100 102  CO2 12* 12* 13*  GLUCOSE 130* 114* 102*  BUN 43* 42* 44*  CREATININE 4.42* 4.64* 5.65*  CALCIUM 8.3* 9.0 9.3   LFT Recent Labs    05/04/23 1217 05/05/23 0515  05/06/23 0410 05/06/23 0935 05/07/23 0438  PROT 5.8* 7.2  --   --  6.0*  ALBUMIN 2.7* 4.7 4.7 6.0* 4.5  AST 44* 29  --   --  16  ALT 25 17  --   --  11  ALKPHOS 117 86  --   --  54  BILITOT 0.9 4.5*  --   --  2.2*   PT/INR Recent Labs    05/05/23 0515 05/07/23 0438  LABPROT 21.5* 24.0*  INR 1.8* 2.1*   Hepatitis Panel No results for input(s): "HEPBSAG", "HCVAB", "HEPAIGM", "HEPBIGM" in the last 72 hours.  Studies/Results CT ABDOMEN PELVIS WO CONTRAST  Result Date: 05/06/2023 CLINICAL DATA:  Abdominal pain and distention. EXAM: CT ABDOMEN AND PELVIS WITHOUT CONTRAST TECHNIQUE: Multidetector CT imaging of the abdomen and pelvis was performed following the standard protocol without IV contrast. RADIATION DOSE REDUCTION: This exam was performed according to the departmental dose-optimization program which includes automated exposure control, adjustment of the mA and/or kV according to patient size and/or use of iterative reconstruction technique. COMPARISON:  December 20, 2022. FINDINGS: Lower chest: Small left pleural effusion is noted. Hepatobiliary: No cholelithiasis or biliary dilatation is noted. Slightly nodular hepatic margins are noted suggesting hepatic cirrhosis. Pancreas: Unremarkable. No pancreatic ductal dilatation or surrounding inflammatory changes. Spleen: Normal in size without focal abnormality. Adrenals/Urinary Tract: Adrenal glands are unremarkable. Kidneys are normal, without renal calculi, focal lesion, or hydronephrosis. Bladder is unremarkable. Stomach/Bowel: Stomach is within normal limits. Appendix appears normal.  No evidence of bowel wall thickening, distention, or inflammatory changes. Vascular/Lymphatic: Aortic atherosclerosis. No enlarged abdominal or pelvic lymph nodes. Reproductive: Uterus and bilateral adnexa are unremarkable. Other: Moderate to severe ascites is noted.  No hernia is noted. Musculoskeletal: No acute or significant osseous findings. IMPRESSION:  Moderate to severe ascites is noted secondary to hepatic cirrhosis. Small left pleural effusion is noted. Aortic Atherosclerosis (ICD10-I70.0). Electronically Signed   By: Lupita Raider M.D.   On: 05/06/2023 18:08   ECHOCARDIOGRAM COMPLETE  Result Date: 05/06/2023    ECHOCARDIOGRAM REPORT   Patient Name:   Mallory Mendoza Date of Exam: 05/06/2023 Medical Rec #:  [id number]       Height:       64.0 in Accession #:    [id number]      Weight:       117.3 lb Date of Birth:  08-25-1981       BSA:          1.559 m Patient Age:    42 years        BP:           91/49 mmHg Patient Gender: F               HR:           94 bpm. [facility] Procedure: 2D Echo, Cardiac Doppler and Color Doppler Indications:    Other abnormalities of the heart R00.8  History:        Patient has prior history of Echocardiogram examinations, most                 recent 12/21/2022. Alcoholic cirrhosis of liver with ascites                 (HCC), Tobacco use disorder.  Sonographer:    Celesta Gentile RCS Referring Phys: [phone] CARLOS MADERA IMPRESSIONS  1. Left ventricular ejection fraction, by estimation, is 70 to 75%. The left ventricle has hyperdynamic function. The left ventricle has no regional wall motion abnormalities. There is mild left ventricular hypertrophy. Left ventricular diastolic parameters were normal.  2. Right ventricular systolic function is normal. The right ventricular size is normal. Tricuspid regurgitation signal is inadequate for assessing PA pressure.  3. The mitral valve is grossly normal. Trivial mitral valve regurgitation.  4. The aortic valve is tricuspid. Aortic valve regurgitation is not visualized. No aortic stenosis is present. Aortic valve mean gradient measures 8.0 mmHg.  5. The inferior vena cava is normal in size with greater than 50% respiratory variability, suggesting right atrial pressure of 3 mmHg. Comparison(s): Prior images reviewed side by side. LVEF vigorous at 70-75%. FINDINGS  Left  Ventricle: Left ventricular ejection fraction, by estimation, is 70 to 75%. The left ventricle has hyperdynamic function. The left ventricle has no regional wall motion abnormalities. The left ventricular internal cavity size was normal in size. There is mild left ventricular hypertrophy. Left ventricular diastolic parameters were normal. Right Ventricle: The right ventricular size is normal. No increase in right ventricular wall thickness. Right ventricular systolic function is normal. Tricuspid regurgitation signal is inadequate for assessing PA pressure. Left Atrium: Left atrial size was normal in size. Right [facility]: Right atrial size was normal in size. Pericardium: There is no evidence of pericardial effusion. Mitral Valve: The mitral valve is grossly normal. Trivial mitral valve regurgitation. Tricuspid Valve: The tricuspid valve is grossly normal. Tricuspid valve regurgitation is mild. Aortic Valve: The aortic valve is tricuspid. Aortic  valve regurgitation is not visualized. No aortic stenosis is present. Aortic valve mean gradient measures 8.0 mmHg. Aortic valve peak gradient measures 18.0 mmHg. Aortic valve area, by VTI measures 2.22  cm. Pulmonic Valve: The pulmonic valve was grossly normal. Pulmonic valve regurgitation is trivial. Aorta: The aortic root is normal in size and structure. Venous: The inferior vena cava is normal in size with greater than 50% respiratory variability, suggesting right atrial pressure of 3 mmHg. IAS/Shunts: No atrial level shunt detected by color flow Doppler.  LEFT VENTRICLE PLAX 2D LVIDd:         4.20 cm   Diastology LVIDs:         1.80 cm   LV e' medial:    8.70 cm/s LV PW:         1.10 cm   LV E/e' medial:  12.9 LV IVS:        1.00 cm   LV e' lateral:   11.40 cm/s LVOT diam:     2.00 cm   LV E/e' lateral: 9.8 LV SV:         77 LV SV Index:   49 LVOT Area:     3.14 cm  RIGHT VENTRICLE RV S prime:     16.50 cm/s TAPSE (M-mode): 2.1 cm LEFT ATRIUM             Index         RIGHT ATRIUM           Index LA diam:        3.60 cm 2.31 cm/m   RA Area:     12.40 cm LA Vol (A2C):   38.9 ml 24.95 ml/m  RA Volume:   27.30 ml  17.51 ml/m LA Vol (A4C):   40.9 ml 26.23 ml/m LA Biplane Vol: 43.7 ml 28.02 ml/m  AORTIC VALVE AV Area (Vmax):    2.45 cm AV Area (Vmean):   2.42 cm AV Area (VTI):     2.22 cm AV Vmax:           212.00 cm/s AV Vmean:          120.000 cm/s AV VTI:            0.345 m AV Peak Grad:      18.0 mmHg AV Mean Grad:      8.0 mmHg LVOT Vmax:         165.00 cm/s LVOT Vmean:        92.600 cm/s LVOT VTI:          0.244 m LVOT/AV VTI ratio: 0.71  AORTA Ao Root diam: 3.00 cm MITRAL VALVE MV Area (PHT): 2.17 cm     SHUNTS MV Decel Time: 349 msec     Systemic VTI:  0.24 m MV E velocity: 112.00 cm/s  Systemic Diam: 2.00 cm MV A velocity: 92.20 cm/s MV E/A ratio:  1.21 Nona Dell MD Electronically signed by Nona Dell MD Signature Date/Time: 05/06/2023/3:48:03 PM    Final    DG ESOPHAGUS W SINGLE CM (SOL OR THIN BA)  Result Date: 05/06/2023 CLINICAL DATA:  42 yo female with history of ETOH cirrhosis, recurrent GI bleed, anxiety, depression and menorrhagia admitted for anemia and AKI. Pt complains of worsening solid food and medication dysphagia. She denies previous esophagus or stomach surgeries. Pt referred for fluoroscopic esophagram study. EXAM: ESOPHAGUS/BARIUM SWALLOW/TABLET STUDY TECHNIQUE: Single contrast examination was performed using water soluble contrast. This exam was performed by Alex Gardener, NP, and was  supervised and interpreted by Roanna Banning, MD. FLUOROSCOPY: Radiation Exposure Index (as provided by the fluoroscopic device): 20.20 mGy Kerma COMPARISON:  None Available. FINDINGS: Exam limited due to patients inability to stand or position independently on exam table. Swallowing: Appears normal. No vestibular penetration or aspiration seen. Pharynx: Unremarkable. Esophagus: Normal appearance. Esophageal motility: Within normal limits. Hiatal Hernia:  None. Gastroesophageal reflux: Spontaneous trace gastric reflux to distal esophagus without provocation Ingested 13mm barium tablet: Pt states she could not swallow tablet. Other: None. IMPRESSION: Exam limited due to patients inability to stand and position independently on exam table. Trace gastroesophageal reflux to distal esophagus. Otherwise, normal esophagram. Electronically Signed   By: Roanna Banning M.D.   On: 05/06/2023 11:17   US RENAL  Result Date: 05/05/2023 CLINICAL DATA:  Acute kidney injury EXAM: RENAL / URINARY TRACT ULTRASOUND COMPLETE COMPARISON:  Ascites ultrasound 04/30/2019. CT chest abdomen pelvis 12/20/2018 FINDINGS: Right Kidney: Renal measurements: 11.2 x 4.2 x 6.0 cm = volume: 148.4 mL. Echogenicity within normal limits. No mass or hydronephrosis visualized. Left Kidney: Renal measurements: 10.4 x 5.4 x 5.3 cm = volume: 156.9 mL. Echogenicity within normal limits. No mass or hydronephrosis visualized. Bladder: Bladder is poorly seen. Other: Diffuse ascites. IMPRESSION: No collecting system dilatation.  Diffuse ascites. Electronically Signed   By: Karen Kays M.D.   On: 05/05/2023 13:52   US Paracentesis  Result Date: 04/30/2023 INDICATION: Patient with history of ETOH cirrhosis, recurrent ascites. Request for diagnostic and therapeutic paracentesis with 5.0 L limit. EXAM: ULTRASOUND GUIDED DIAGNOSTIC AND THERAPEUTIC PARACENTESIS MEDICATIONS: 7 mL 1% lidocaine COMPLICATIONS: None immediate. PROCEDURE: Informed written consent was obtained from the patient after a discussion of the risks, benefits and alternatives to treatment. A timeout was performed prior to the initiation of the procedure. Initial ultrasound scanning demonstrates a large amount of ascites within the right lower abdominal quadrant. The right lower abdomen was prepped and draped in the usual sterile fashion. 1% lidocaine was used for local anesthesia. Following this, a 19 gauge, 7-cm, Yueh catheter was introduced. An  ultrasound image was saved for documentation purposes. The paracentesis was performed. The catheter was removed and a dressing was applied. The patient tolerated the procedure well without immediate post procedural complication. FINDINGS: A total of approximately 3.6 L of serosanguineous fluid was removed. Samples were sent to the laboratory as requested by the clinical team. IMPRESSION: Successful ultrasound-guided paracentesis yielding 3.6 liters of peritoneal fluid. Performed by Lynnette Caffey, PA-C Electronically Signed   By: Olive Bass M.D.   On: 04/30/2023 15:25   US Paracentesis  Result Date: 04/27/2023 INDICATION: History of cirrhosis with recurrent ascites. Patient presents today for diagnostic and therapeutic paracentesis with 5 L maximum. EXAM: ULTRASOUND GUIDED diagnostic and therapeutic PARACENTESIS MEDICATIONS: None. COMPLICATIONS: None immediate. PROCEDURE: Informed written consent was obtained from the patient after a discussion of the risks, benefits and alternatives to treatment. A timeout was performed prior to the initiation of the procedure. Initial ultrasound scanning demonstrates a large amount of ascites within the right lower abdominal quadrant. The right lower abdomen was prepped and draped in the usual sterile fashion. 1% lidocaine was used for local anesthesia. Following this, a 19 gauge, 7-cm, Yueh catheter was introduced. An ultrasound image was saved for documentation purposes. The paracentesis was performed. The catheter was removed and a dressing was applied. The patient tolerated the procedure well without immediate post procedural complication. Patient received post-procedure intravenous albumin; see nursing notes for details. FINDINGS: A total of approximately 5 L  of clear yellow fluid was removed. Samples were sent to the laboratory as requested by the clinical team. IMPRESSION: Successful ultrasound-guided paracentesis yielding 5 liters of peritoneal fluid. PLAN: The  patient has previously been formally evaluated by the [facility] Interventional Radiology Portal Hypertension Clinic and is being actively followed for potential future intervention. TIPS consultation completed 03/18/2023 by Dr. Grace Isaac. TIPS procedure pending echocardiogram. Procedure performed by Mina Marble, PA-C Electronically Signed   By: Irish Lack M.D.   On: 04/27/2023 14:28   US Paracentesis  Result Date: 04/23/2023 INDICATION: ascites Briefly, 42 year old female with a history of EtOH cirrhosis and refractory ascites. TIPS consultation completed 03/18/2023, pending procedure. EXAM: ULTRASOUND GUIDED DIAGNOSTIC AND THERAPEUTIC PARACENTESIS MEDICATIONS: 10 mL lidocaine 1% COMPLICATIONS: None immediate. PROCEDURE: Informed written consent was obtained from the patient after a discussion of the risks, benefits and alternatives to treatment. A timeout was performed prior to the initiation of the procedure. Initial ultrasound scanning demonstrates a large amount of ascites within the right lower abdominal quadrant. The right lower abdomen was prepped and draped in the usual sterile fashion. 1% lidocaine was used for local anesthesia. Following this, a 6 Fr Safe-T-Centesis catheter was introduced. An ultrasound image was saved for documentation purposes. The paracentesis was performed. The catheter was removed and a dressing was applied. The patient tolerated the procedure well without immediate post procedural complication. Patient received post-procedure intravenous albumin; see nursing notes for details. FINDINGS: A total of approximately 5 L of serous peritoneal fluid was removed. Samples were sent to the laboratory as requested by the clinical team. IMPRESSION: Successful ultrasound-guided paracentesis yielding 5 L of peritoneal fluid. PLAN: The patient has previously been formally evaluated by the [facility] Interventional Radiology Portal Hypertension Clinic and is being actively followed for  potential future intervention. TIPS consultation completed 03/18/2023 by Dr. Grace Isaac. Procedure pending echocardiogram. Roanna Banning, MD [facility] Radiology Electronically Signed   By: Roanna Banning M.D.   On: 04/23/2023 11:46   US Paracentesis  Result Date: 04/16/2023 INDICATION: ascites Briefly, 42 year old female with a history of EtOH cirrhosis with refractory ascites. TIPS consultation completed 03/18/2023, pending procedure EXAM: ULTRASOUND GUIDED DIAGNOSTIC and THERAPEUTIC PARACENTESIS MEDICATIONS: 10 mL lidocaine 1% COMPLICATIONS: None immediate. PROCEDURE: Informed written consent was obtained from the patient after a discussion of the risks, benefits and alternatives to treatment. A timeout was performed prior to the initiation of the procedure. Initial ultrasound scanning demonstrates a large amount of ascites within the right lower abdominal quadrant. The right lower abdomen was prepped and draped in the usual sterile fashion. 1% lidocaine was used for local anesthesia. Following this, a 19 gauge, 7-cm, Yueh catheter was introduced. An ultrasound image was saved for documentation purposes. The paracentesis was performed. The catheter was removed and a dressing was applied. The patient tolerated the procedure well without immediate post procedural complication. Patient received post-procedure intravenous albumin; see nursing notes for details. FINDINGS: A total of approximately 5 L of serous peritoneal fluid was removed. Samples were sent to the laboratory as requested by the clinical team. IMPRESSION: Successful ultrasound-guided paracentesis yielding 5 L of peritoneal fluid. PLAN: The patient has previously been formally evaluated by the [facility] Interventional Radiology Portal Hypertension Clinic and is being actively followed for potential future intervention. TIPS consultation completed 03/18/2023 by Dr. Grace Isaac. Procedure pending echocardiogram. Roanna Banning, MD [facility] Radiology Electronically Signed   By: Roanna Banning M.D.   On: 04/16/2023 10:17   US Paracentesis  Result Date: 04/13/2023 INDICATION: 42 y.o. Female. History of cirrhosis with recurrent ascites. Patient presents for therapeutic and diagnotic paracentesis. Maximum 5 Liters. EXAM: ULTRASOUND GUIDED THERAPEUTIC AND DIAGNOSTIC PARACENTESIS MEDICATIONS: Lidocaine 1% - 10 ml's COMPLICATIONS: None immediate. PROCEDURE: Informed written consent was obtained from the patient after a discussion of the risks, benefits and alternatives to treatment. A timeout was performed prior to the initiation of the procedure. Initial ultrasound scanning demonstrates a large amount of ascites within the right lower abdominal quadrant. The right lower abdomen was prepped and draped in the usual sterile fashion. 1% lidocaine was used for local anesthesia. Following this, a 19 gauge, 7-cm, Yueh catheter was introduced. An ultrasound image was saved for documentation purposes. The paracentesis was performed. The catheter was removed and a dressing was applied. The patient tolerated the procedure well without immediate post procedural complication. Patient received post-procedure intravenous albumin; see nursing notes for details. FINDINGS: A total of approximately 5 L of pale yellow fluid was removed. Samples were sent to the laboratory as requested by the clinical team. IMPRESSION: Successful ultrasound-guided therapeutic and diagnostic paracentesis yielding 5 liters of peritoneal fluid. Performed by: Anders Grant NP PLAN: The patient has previously been evaluated by the [facility] Interventional Radiology Portal Hypertension Clinic, and deemed not a candidate for intervention. See note from IR Attending Dr. Odis Luster. Patient scheduled for ECHO on 6.28.24 Electronically Signed   By: Lupita Raider M.D.   On: 04/13/2023 12:43    Assessment  42 y.o. female with  a history of decompensated alcoholic cirrhosis, PBC, alcoholic hepatitis in December 2023 complicated by [facility], recurrent ascites with frequent paracenteses, chronic anemia with recurrent hospitalization due to acute on chronic anemia, anxiety, depression, and recent admission in May with acute on chronic anemia and AKI he represented to the ED at the recommendation of our office due to worsening anemia and AKI on outpatient labs.  Decompensated Cirrhosis: Secondary to prior EtOH as well as PBC.  Reportedly sober since November 2023.  Course has been complicated by recurrent ascites, previous EGD, and coagulopathy.  Has seen IR and is undergoing TIPS evaluation.  Also previously referred to [facility] for transplant evaluation however appointment has not been scheduled yet.  She presents this admission with recurrent ascites in the setting of noncompliance to diet.  She has hypoalbuminemia and she has been on Lasix 40 mg twice daily only for the last 2 weeks given she stopped spironolactone herself due to taste.  The last several weeks she has been undergoing biweekly paracentesis.  Last para on 6/14 without SBP.  She continues to have presence of ascites on exam but does not overtly tense and denies any tenderness.  Lasix has been on hold in the setting of AKI and concern for hepatorenal syndrome.  Evidence of hepatic encephalopathy present today. More drowsy than previously reported and has presence of asterixis on exam. Will check ammonia level and increase lactulose since Xifaxin is on hold.   Update: Ammonia level elevated. Need to monitor mental status closely and if she remains excessively drowsy or becomes more altered then may need to transition to lactulose enemas.   MELD 3.0: 35 (36 on 6/19)  AKI, likely Hepatorenal syndrome: Creatinine previously normal however creatinine 5.17 on admission with some improvement to 4.42 yesterday however with frequent reduction of albumin she has had an  increase in her creatinine to 5.65 today.  AKI likely multifactorial in the setting of hypotension secondary to vaginal, frequent paracenteses, diuretics, loose  stools, and nausea/vomiting.  Diuretics have been on hold given AKI and nadolol on hold due to hypotension.  She has been receiving albumin and was started on midodrine.  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  Nephrology did increase midodrine to 15 mg 3 times daily today and advised that it paracentesis needed she should have no more than 4 L removed and would require additional albumin.  Dysphagia, Nausea/vomiting: Etiology is likely multifactorial but unclear at this time.  Patient thought this was secondary to spironolactone as well as Xifaxan.  She has had chronic LLQ pain and poor p.o. intake this admission.  She previously had complaints of mild dysphagia. BPE attempted 6/20 but exam limited given her inability to swallow barium tablet. There was evidence of trace reflux, otherwise normal. CT A/P 6/20 with moderate to severe ascites, small left pleural effusion. No evidence of obstruction.   Weight loss: Likely secondary to poor p.o. intake in the setting of nausea and vomiting.  Recent EGD with portal hypertensive gastropathy and touch friability to gastric mucosa.  Appears she does have a 13 pound weight loss.  Needs outpatient colonoscopy for further evaluation.  Acute on chronic anemia: Recurrent issue.  Has had frequent hospitalizations for anemia.  In May she had hemoglobin of 7 without evidence of iron, B12, or folate deficiency.  Prior recent outpatient labs with hemoglobin of 6.3 and was revealed to be 5.8 on arrival.   Stool was heme-negative.  She has reported low-volume intermittent vaginal bleeding.  As noted above she has never had a colonoscopy, but EGD with PHG and touch friability which has been oozing.  No evidence of esophageal varices.  She has been maintained on PPI twice daily.  Received 2 units PRBCs this admission.  Hemoglobin remained stable today at 7.4 (7.9 yesterday).  Plan is for outpatient colonoscopy unless she develops overt GI bleeding.  She has been recommended to follow-up with GYN regarding vaginal bleeding  Plan / Recommendations  Ammonia level Continue albumin for 48 hours Appreciate ongoing nephrology input Daily CBC, CMP, INR PPI BID Antiemetics as needed.  2g sodium diet Hold Xifaxin Continue to hold nadolol and Lasix Continue lactulose, increase to 30 g TID Needs outpatient transplant evaluation with [facility], if no improvement inpatient may need to consider transfer Outpatient GYN evaluation Outpatient colonoscopy, possible repeat EGD if persistent vomiting    LOS: 3 days    05/07/2023, 9:15 AM   Brooke Bonito, MSN, FNP-BC, AGACNP-BC [facility]

## 2023-05-08 ENCOUNTER — Inpatient Hospital Stay (HOSPITAL_COMMUNITY): Payer: Medicaid Other

## 2023-05-08 DIAGNOSIS — N179 Acute kidney failure, unspecified: Secondary | ICD-10-CM | POA: Diagnosis not present

## 2023-05-08 DIAGNOSIS — D649 Anemia, unspecified: Secondary | ICD-10-CM | POA: Diagnosis not present

## 2023-05-08 DIAGNOSIS — K766 Portal hypertension: Secondary | ICD-10-CM | POA: Diagnosis not present

## 2023-05-08 DIAGNOSIS — I9589 Other hypotension: Secondary | ICD-10-CM

## 2023-05-08 DIAGNOSIS — I959 Hypotension, unspecified: Secondary | ICD-10-CM

## 2023-05-08 DIAGNOSIS — K7682 Hepatic encephalopathy: Secondary | ICD-10-CM | POA: Diagnosis not present

## 2023-05-08 DIAGNOSIS — K7031 Alcoholic cirrhosis of liver with ascites: Secondary | ICD-10-CM | POA: Diagnosis not present

## 2023-05-08 DIAGNOSIS — K767 Hepatorenal syndrome: Secondary | ICD-10-CM | POA: Diagnosis not present

## 2023-05-08 LAB — COMPREHENSIVE METABOLIC PANEL
ALT: 9 U/L (ref 0–44)
AST: 14 U/L — ABNORMAL LOW (ref 15–41)
Albumin: 5.2 g/dL — ABNORMAL HIGH (ref 3.5–5.0)
Alkaline Phosphatase: 47 U/L (ref 38–126)
Anion gap: 15 (ref 5–15)
BUN: 47 mg/dL — ABNORMAL HIGH (ref 6–20)
CO2: 13 mmol/L — ABNORMAL LOW (ref 22–32)
Calcium: 10.2 mg/dL (ref 8.9–10.3)
Chloride: 107 mmol/L (ref 98–111)
Creatinine, Ser: 6.68 mg/dL — ABNORMAL HIGH (ref 0.44–1.00)
GFR, Estimated: 7 mL/min — ABNORMAL LOW (ref >60)
Glucose, Bld: 110 mg/dL — ABNORMAL HIGH (ref 70–99)
Potassium: 3.3 mmol/L — ABNORMAL LOW (ref 3.5–5.1)
Sodium: 135 mmol/L (ref 135–145)
Total Bilirubin: 2.6 mg/dL — ABNORMAL HIGH (ref 0.3–1.2)
Total Protein: 6.3 g/dL — ABNORMAL LOW (ref 6.5–8.1)

## 2023-05-08 LAB — PROTIME-INR
INR: 2.2 — ABNORMAL HIGH (ref 0.8–1.2)
Prothrombin Time: 24.7 seconds — ABNORMAL HIGH (ref 11.4–15.2)

## 2023-05-08 LAB — CBC
HCT: 20.1 % — ABNORMAL LOW (ref 36.0–46.0)
Hemoglobin: 7.2 g/dL — ABNORMAL LOW (ref 12.0–15.0)
MCH: 30.3 pg (ref 26.0–34.0)
MCHC: 35.8 g/dL (ref 30.0–36.0)
MCV: 84.5 fL (ref 80.0–100.0)
Platelets: 163 10*3/uL (ref 150–400)
RBC: 2.38 MIL/uL — ABNORMAL LOW (ref 3.87–5.11)
RDW: 15.1 % (ref 11.5–15.5)
WBC: 8.9 10*3/uL (ref 4.0–10.5)
nRBC: 0 % (ref 0.0–0.2)

## 2023-05-08 LAB — CULTURE, BODY FLUID W GRAM STAIN -BOTTLE

## 2023-05-08 MED ORDER — HYDROMORPHONE HCL 1 MG/ML IJ SOLN
0.5000 mg | Freq: Four times a day (QID) | INTRAMUSCULAR | Status: DC | PRN
  Administered 2023-05-09: 0.5 mg via INTRAVENOUS
  Filled 2023-05-08: qty 0.5

## 2023-05-08 MED ORDER — MELATONIN 3 MG PO TABS
3.0000 mg | ORAL_TABLET | Freq: Every day | ORAL | Status: DC
  Administered 2023-05-08 – 2023-05-09 (×2): 3 mg via ORAL
  Filled 2023-05-08 (×2): qty 1

## 2023-05-08 MED ORDER — OXYCODONE HCL 5 MG PO TABS
5.0000 mg | ORAL_TABLET | ORAL | Status: DC | PRN
  Administered 2023-05-08 – 2023-05-10 (×3): 5 mg via ORAL
  Filled 2023-05-08 (×3): qty 1

## 2023-05-08 MED ORDER — SODIUM CHLORIDE 0.9 % IV SOLN
250.0000 mL | INTRAVENOUS | Status: DC
  Administered 2023-05-08: 250 mL via INTRAVENOUS

## 2023-05-08 MED ORDER — NOREPINEPHRINE 4 MG/250ML-% IV SOLN
2.0000 ug/min | INTRAVENOUS | Status: DC
  Administered 2023-05-08: 2 ug/min via INTRAVENOUS
  Filled 2023-05-08: qty 250

## 2023-05-08 NOTE — Progress Notes (Addendum)
Spoke earlier today with Dr. Sonda Rumble from [facility] liver transplant team.  We discussed the patient's case in detail.  She is needed given her worsening mental status, it will be difficult to determine the patient's psychosocial status prior to considering her for emergent liver transplant evaluation.  Stated that if her mental status were to improve, her evaluation could be reconsidered.  For now, she recommended improving her hepatorenal syndrome as much as possible with Levophed in the ICU on top of her current medical treatment.  Notably, I was notified that the patient had a positive urine pregnancy test diagnosed 04/05/2023.  I confirmed her hCG at that time was positive at 16 - a repeat test on the same day was 11.4.  I discussed with the nursing staff to obtain urine straight cath to check her urine hCG but I will order blood hCG level in the morning to rule out possible pregnancy.

## 2023-05-08 NOTE — Progress Notes (Signed)
Mallory Mendoza, M.D. Gastroenterology & Hepatology   Interval History:  Patient remains encephalopathic as she has presented frequent somnolence and does not follow commands adequately, cannot have a conversation. Had 1 large bowel movement today.  No melena or hematochezia. Underwent paracentesis yesterday with removal of 4 L of fluid, negative for SBP.   Inpatient Medications:  Current Facility-Administered Medications:    0.9 %  sodium chloride infusion (Manually program via Guardrails IV Fluids), , Intravenous, Once, Vassie Loll, MD, Held at 05/04/23 1436   0.9 %  sodium chloride infusion (Manually program via Guardrails IV Fluids), , Intravenous, Once, Vassie Loll, MD   acetaminophen (TYLENOL) tablet 650 mg, 650 mg, Oral, Q6H PRN **OR** acetaminophen (TYLENOL) suppository 650 mg, 650 mg, Rectal, Q6H PRN, Vassie Loll, MD   albumin human 25 % solution 25 g, 25 g, Intravenous, Q6H, Annie Sable, MD, Last Rate: 60 mL/hr at 05/08/23 0336, 25 g at 05/08/23 0336   camphor-menthol (SARNA) lotion, , Topical, PRN, Vassie Loll, MD, Given at 05/06/23 0940   Chlorhexidine Gluconate Cloth 2 % PADS 6 each, 6 each, Topical, Daily, Vassie Loll, MD, 6 each at 05/08/23 0853   feeding supplement (ENSURE ENLIVE / ENSURE PLUS) liquid 237 mL, 237 mL, Oral, BID BM, Vassie Loll, MD, 237 mL at 05/06/23 1132   folic acid (FOLVITE) tablet 1 mg, 1 mg, Oral, Daily, Vassie Loll, MD, 1 mg at 05/08/23 0844   HYDROmorphone (DILAUDID) injection 0.5 mg, 0.5 mg, Intravenous, Q4H PRN, Vassie Loll, MD   hydrOXYzine (ATARAX) tablet 25 mg, 25 mg, Oral, Q6H PRN, Vassie Loll, MD, 25 mg at 05/04/23 2209   lactulose (CHRONULAC) 10 GM/15ML solution 30 g, 30 g, Oral, TID, Mahon, Courtney L, NP, 30 g at 05/08/23 0843   melatonin tablet 3 mg, 3 mg, Oral, QHS, Vassie Loll, MD   midodrine (PROAMATINE) tablet 15 mg, 15 mg, Oral, TID WC, Annie Sable, MD, 15 mg at 05/08/23 0844    mirtazapine (REMERON) tablet 7.5 mg, 7.5 mg, Oral, QHS, Vassie Loll, MD, 7.5 mg at 05/07/23 2146   multivitamin with minerals tablet 1 tablet, 1 tablet, Oral, Daily, Vassie Loll, MD, 1 tablet at 05/08/23 0844   nicotine (NICODERM CQ - dosed in mg/24 hours) patch 14 mg, 14 mg, Transdermal, Daily, Vassie Loll, MD, 14 mg at 05/08/23 0844   octreotide (SANDOSTATIN) injection 200 mcg, 200 mcg, Subcutaneous, TID, Annie Sable, MD, 200 mcg at 05/07/23 2204   ondansetron (ZOFRAN) tablet 4 mg, 4 mg, Oral, Q6H PRN **OR** ondansetron (ZOFRAN) injection 4 mg, 4 mg, Intravenous, Q6H PRN, Vassie Loll, MD, 4 mg at 05/07/23 1000   pantoprazole (PROTONIX) EC tablet 40 mg, 40 mg, Oral, BID, Vassie Loll, MD, 40 mg at 05/08/23 0844   prochlorperazine (COMPAZINE) injection 10 mg, 10 mg, Intravenous, Q6H PRN, Vassie Loll, MD, 10 mg at 05/06/23 1347   rifaximin (XIFAXAN) tablet 550 mg, 550 mg, Oral, BID, Vassie Loll, MD, 550 mg at 05/08/23 0844   sodium bicarbonate tablet 1,300 mg, 1,300 mg, Oral, TID, Anthony Sar, MD, 1,300 mg at 05/08/23 0844   ursodiol (ACTIGALL) capsule 300 mg, 300 mg, Oral, TID, Vassie Loll, MD, 300 mg at 05/08/23 0847   zinc sulfate capsule 220 mg, 220 mg, Oral, Daily, Marguerita Merles, Reuel Boom, MD, 220 mg at 05/08/23 0844   I/O    Intake/Output Summary (Last 24 hours) at 05/08/2023 0857 Last data filed at 05/07/2023 1528 Gross per 24 hour  Intake 441.03 ml  Output --  Net 441.03 ml  Physical Exam: Temp:  [97.9 F (36.6 C)-98.9 F (37.2 C)] 98.6 F (37 C) (06/22 0405) Pulse Rate:  [86-96] 96 (06/22 0405) Resp:  [17-20] 20 (06/22 0405) BP: (81-100)/(47-66) 95/58 (06/22 0405) SpO2:  [100 %] 100 % (06/22 0405)  Temp (24hrs), Avg:98.5 F (36.9 C), Min:97.9 F (36.6 C), Max:98.9 F (37.2 C) GENERAL: The patient is Awake but OX1, in no acute distress. HEENT: Head is normocephalic and atraumatic. EOMI are intact. Mouth is well hydrated and without  lesions. NECK: Supple. No masses LUNGS: Clear to auscultation. No presence of rhonchi/wheezing/rales. Adequate chest expansion HEART: RRR, normal s1 and s2. ABDOMEN: Soft, nontender, no guarding, no peritoneal signs.  Moderate distention but not tense.  BS +. No masses. EXTREMITIES: Without any cyanosis, clubbing, rash, lesions or edema. NEUROLOGIC: AOx1, no focal motor deficit.  Has asterixis. SKIN: no jaundice, no rashes  Laboratory Data: CBC:     Component Value Date/Time   WBC 8.9 05/08/2023 0453   RBC 2.38 (L) 05/08/2023 0453   HGB 7.2 (L) 05/08/2023 0453   HCT 20.1 (L) 05/08/2023 0453   PLT 163 05/08/2023 0453   MCV 84.5 05/08/2023 0453   MCH 30.3 05/08/2023 0453   MCHC 35.8 05/08/2023 0453   RDW 15.1 05/08/2023 0453   LYMPHSABS 2.0 05/04/2023 0837   MONOABS 1.2 (H) 05/04/2023 0837   EOSABS 0.2 05/04/2023 0837   BASOSABS 0.0 05/04/2023 0837   COAG:  Lab Results  Component Value Date   INR 2.2 (H) 05/08/2023   INR 2.1 (H) 05/07/2023   INR 1.8 (H) 05/05/2023    BMP:     Latest Ref Rng & Units 05/08/2023    4:53 AM 05/07/2023    4:38 AM 05/06/2023    9:35 AM  BMP  Glucose 70 - 99 mg/dL 782  956  213   BUN 6 - 20 mg/dL 47  44  42   Creatinine 0.44 - 1.00 mg/dL 0.86  5.78  4.69   Sodium 135 - 145 mmol/L 135  129  129   Potassium 3.5 - 5.1 mmol/L 3.3  3.1  3.2   Chloride 98 - 111 mmol/L 107  102  100   CO2 22 - 32 mmol/L 13  13  12    Calcium 8.9 - 10.3 mg/dL 62.9  9.3  9.0     HEPATIC:     Latest Ref Rng & Units 05/08/2023    4:53 AM 05/07/2023    4:38 AM 05/06/2023    9:35 AM  Hepatic Function  Total Protein 6.5 - 8.1 g/dL 6.3  6.0    Albumin 3.5 - 5.0 g/dL 5.2  4.5  6.0   AST 15 - 41 U/L 14  16    ALT 0 - 44 U/L 9  11    Alk Phosphatase 38 - 126 U/L 47  54    Total Bilirubin 0.3 - 1.2 mg/dL 2.6  2.2      CARDIAC: No results found for: "CKTOTAL", "CKMB", "CKMBINDEX", "TROPONINI"    Imaging: I personally reviewed and interpreted the available labs, imaging  and endoscopic files.   Assessment/Plan: 42 year old female with history of anxiety, depression, decompensated alcoholic cirrhosis, PBC, alcoholic hepatitis, hepatic encephalopathy, recurrent ascites, CKD, who was admitted after presenting worsening anemia without significant GI bleeding.  Patient was found to have significant drop in her hemoglobin but no melena or hematochezia.  The patient has presented worsening mental status during the current hospitalization along with worsening AKI on CKD.  Nephrology  is currently following her and she is receiving treatment with midodrine and octreotide, as well as albumin with very poor improvement of her renal function.  This has been concerning for possible hepatorenal syndrome which has been poorly responsive.  Her mental status has not improved despite using Xifaxan, lactulose and zinc.  Of note, her MELD 3.0 has been persistently elevated above 30, with last value of 32 today.  Overall, I am concerned about acute on chronic liver failure.  SBP was ruled out with paracentesis yesterday.  We will obtain a chest x-ray and I urinalysis, may have low threshold to start her on broad-spectrum antibiotics given the possible risk of concomitant infection.  As her overall condition has been declining, I will favor evaluation by tertiary transplant center.  I called [facility] for evaluation will discuss the case with them.  I discussed this with her son who is in agreement.  Guarded prognosis  Will discuss case with [facility] liver transplant center for possible transfer Continue octreotide and midodrine for possible HRS appreciate ongoing nephrology input Daily CBC, CMP, INR PPI BID Antiemetics as needed.  2g sodium diet Continue Xifaxan 550 mg twice daily Continue zinc 220 mg daily Continue to hold nadolol and Lasix Continue lactulose 30 g TID, titrate to achieve 2-3 bowel movements per day  Mallory Blazing, MD Gastroenterology and Hepatology [facility]

## 2023-05-08 NOTE — Progress Notes (Signed)
Progress Note   Patient: Mallory Mendoza:[id number] DOB: Aug 08, 1981 DOA: 05/04/2023     4 DOS: the patient was seen and examined on 05/08/2023   Brief hospital admission narrative course: As per H&P written by Dr. Sherryll Burger on 05/05/2023 [name] is a 42 y.o. female with medical history significant for alcoholic liver cirrhosis with recurrent ascites requiring frequent paracentesis, recurrent GI bleed, history of anxiety and depression, chronic anemia, and recent discharge for menorrhagia with acute on chronic anemia requiring 1 unit PRBC transfusion who presents to the ED with abnormal labs that were drawn by GI.  She was noted to have a hemoglobin of 5.8 and worsening creatinine levels up to 5.17.  It appears that her creatinine has recently been upward trending on last admission up to around 2-2.5.  She is also noted to have chronic hyponatremia.  She complains of feeling somewhat fatigued and has chronic abdominal pain related to her abdominal distention.  She continues to have some mild vaginal bleeding and has not followed up with gynecology since her recent discharge and is not currently on Megace.  Additionally she has been complaining of shivering and chills and feels quite cold and uses several blankets every day to help warm up.   ED Course: Vital signs stable and patient is afebrile.  Hemoglobin 5.8 with 2 units PRBCs ordered.  Creatinine 5.17 with some metabolic acidosis discussion had with nephrology with no emergent need for hemodialysis, but they will follow and routine consultation.  GI to follow inpatient.  Ammonia is 76, but she is mentating well.  Stool occult study negative.  Assessment and Plan: 1-acute on chronic symptomatic anemia -In the setting of ongoing vaginal bleeding -Status post 2 unit PRBC transfusion this admission. -Fecal occult blood test negative -Continue patient follow-up with Dr. Despina Hidden as an outpatient -if needed will give depo shot IM -Hemoglobin 7.2 and  is stable, continue to monitor; plan to transfuse for Hgb less than 7.  2-worsening acute kidney injury with metabolic acidosis -With concern for hepatorenal syndrome -Continue to follow nephrology service recommendations. -Continue to maintain adequate hydration -Continue to avoid nephrotoxic agent -Continue sodium bicarbonate, PPI, octreotide, midodrine and albumin -Follow renal function trend. -Fluid restriction to 1.5 L daily -Patient's midodrine dose has been adjusted to 15 mg 3 times a day given low BP and MAP; will continue to follow nephrology service recommendation. -Cr has continue to worsen and currently up to 6.68; electrolyte and BUN ok. If HD needed will require CRRT given BP.  3-alcoholic liver cirrhosis with portal hypertension and recurring ascites -Echo will be done to facilitate/accelerate TIPS procedure as an outpatient; will follow results. -Continue to follow GI service recommendation -Continue PPI -Continue lactulose at adjusted dosages and follow ammonia level. -Normal mentation at the moment. -Outpatient endoscopic evaluation anticipated. -No signs of active bleeding currently. -Gastroenterology service with concern for SBP; paracentesis diagnostic has been performed on 05/07/23; gram stain without microorganism seen, final results pending, but preliminarily not suggesting to have SBP.  4-chronic pain syndrome -Continue as needed analgesics -Continue to avoid the use of NSAIDs.  5-hyponatremia -Appears to be chronic in the setting of renal failure and liver cirrhosis with chronic diuretic usage. -currently 135 -Continue to follow electrolytes trend and follow nephrology recommendations.  6-history of anxiety/depression -Overall stable mood -No suicidal ideation or hallucination -Continue current anxiolytic/antidepressant regimen.  7-tobacco abuse -Cessation counseling provided -Continue nicotine patch. -Patient was receptive to  counseling  8-insomnia -Continue supportive care -Sleep hygiene and  qhs melatonin discussed with patient.  Subjective:  No fever, no chest pain, no nausea or vomiting.  Continue reporting intermittent abdominal pain and having some decrease in her urine output.  Physical Exam: Vitals:   05/07/23 1757 05/07/23 1901 05/07/23 1926 05/08/23 0405  BP: (!) 85/56 (!) 92/55 (!) 90/57 (!) 95/58  Pulse: 93  86 96  Resp:   18 20  Temp:   97.9 F (36.6 C) 98.6 F (37 C)  TempSrc:   Axillary Oral  SpO2:   100% 100%  Weight:      Height:       General exam: Alert, following commands appropriately and just slow to respond.  No fever, no chest pain, no nausea or vomiting. Respiratory system: No using accessory muscles; good saturation on room air. Cardiovascular system:RRR. No rubs or gallops.  No JVD. Gastrointestinal system: Abdomen is mildly tender to deep palpation diffusely; positive fluid wave and distention appreciated.  Positive bowel sounds.  No guarding Central nervous system: Positive asterixis; no focal neurological deficits. Extremities: No cyanosis, clubbing or edema. Skin: No petechiae. Psychiatry: Judgement and insight appear normal.  Flat affect appreciated.  Data Reviewed: CBC: WBCs 8.9, hemoglobin 7.2, platelet count 163 K Comprehensive metabolic panel: Sodium 135, potassium 3.3, chloride 107, bicarb 13, glucose 110, BUN 47, creatinine 6.68, AST 14, ALT 9, alkaline phosphatase 47, total bilirubin 2.6 and GFR 7  Family Communication: Mother at bedside.  Disposition: Status is: Inpatient Remains inpatient appropriate because: Continue treatment for acute on chronic renal failure.   Planned Discharge Destination: Home  Time spent: 50 minutes  Author: Vassie Loll, MD 05/08/2023 8:34 AM  For on call review [email or web address].

## 2023-05-08 NOTE — Procedures (Signed)
Procedure Note  05/08/23    Preoperative Diagnosis: Hypotension, poor access, acute on chronic renal failure    Postoperative Diagnosis: Same   Procedure(s) Performed: Central Line placement, right internal jugular    Surgeon: Leatrice Jewels. Henreitta Leber, MD   Assistants: None   Anesthesia: 1% lidocaine    Complications: None    Indications: [name] is a 41 y.o. with alcoholic cirrhosis, hypotension, concern for hepatorenal syndrome with acute on chronic renal failure presenting with anemia. She needs pressors and also access. I discussed the risk and benefits of placement of the central line with her family, including but not limited to bleeding, infection, and risk of pneumothorax. They have given consent for the procedure.    Procedure: The patient placed supine. The right chest and neck was prepped and draped in the usual sterile fashion.  Wearing full gown and gloves, I performed the procedure.  One percent lidocaine was used for local anesthesia. An ultrasound was utilized to assess the jugular vein.  The needle with syringe was advanced into the vein with dark venous return, and a wire was placed using the Seldinger technique without difficulty.  Ectopia was not noted.  The skin was knicked and a dilator was placed, and the three lumen catheter was placed over the wire with continued control of the wire.  There was good draw back of blood from all three lumens and each flushed easily with saline.  The catheter was secured in 4 points with 2-0 silk and a biopatch and dressing was placed.     The patient tolerated the procedure well, and the CXR was ordered to confirm position of the central line.   The line was a little deep and I pulled it back 2cm with sterile gloves and resewed it in and placed a new sterile dressing. The repeat CXR showed it in good position.   Algis Greenhouse, MD [facility] [address] [place], [place] [address] [phone]  (office)

## 2023-05-08 NOTE — Progress Notes (Signed)
[facility]  Cxr with line in good position, no ptx.  Algis Greenhouse, MD

## 2023-05-09 DIAGNOSIS — D649 Anemia, unspecified: Secondary | ICD-10-CM | POA: Diagnosis not present

## 2023-05-09 DIAGNOSIS — K767 Hepatorenal syndrome: Secondary | ICD-10-CM | POA: Diagnosis not present

## 2023-05-09 DIAGNOSIS — K7682 Hepatic encephalopathy: Secondary | ICD-10-CM | POA: Diagnosis not present

## 2023-05-09 DIAGNOSIS — K219 Gastro-esophageal reflux disease without esophagitis: Secondary | ICD-10-CM | POA: Diagnosis not present

## 2023-05-09 DIAGNOSIS — K7031 Alcoholic cirrhosis of liver with ascites: Secondary | ICD-10-CM | POA: Diagnosis not present

## 2023-05-09 DIAGNOSIS — I959 Hypotension, unspecified: Secondary | ICD-10-CM

## 2023-05-09 LAB — CBC
HCT: 20.4 % — ABNORMAL LOW (ref 36.0–46.0)
Hemoglobin: 7.3 g/dL — ABNORMAL LOW (ref 12.0–15.0)
MCH: 30 pg (ref 26.0–34.0)
MCHC: 35.8 g/dL (ref 30.0–36.0)
MCV: 84 fL (ref 80.0–100.0)
Platelets: 159 10*3/uL (ref 150–400)
RBC: 2.43 MIL/uL — ABNORMAL LOW (ref 3.87–5.11)
RDW: 15.2 % (ref 11.5–15.5)
WBC: 9.6 10*3/uL (ref 4.0–10.5)
nRBC: 0 % (ref 0.0–0.2)

## 2023-05-09 LAB — RENAL FUNCTION PANEL
Albumin: 5.3 g/dL — ABNORMAL HIGH (ref 3.5–5.0)
Anion gap: 18 — ABNORMAL HIGH (ref 5–15)
BUN: 52 mg/dL — ABNORMAL HIGH (ref 6–20)
CO2: 13 mmol/L — ABNORMAL LOW (ref 22–32)
Calcium: 11.2 mg/dL — ABNORMAL HIGH (ref 8.9–10.3)
Chloride: 107 mmol/L (ref 98–111)
Creatinine, Ser: 7.36 mg/dL — ABNORMAL HIGH (ref 0.44–1.00)
GFR, Estimated: 7 mL/min — ABNORMAL LOW (ref >60)
Glucose, Bld: 139 mg/dL — ABNORMAL HIGH (ref 70–99)
Phosphorus: 5.9 mg/dL — ABNORMAL HIGH (ref 2.5–4.6)
Potassium: 3.1 mmol/L — ABNORMAL LOW (ref 3.5–5.1)
Sodium: 138 mmol/L (ref 135–145)

## 2023-05-09 LAB — HEPATIC FUNCTION PANEL
ALT: 9 U/L (ref 0–44)
AST: 13 U/L — ABNORMAL LOW (ref 15–41)
Albumin: 5.3 g/dL — ABNORMAL HIGH (ref 3.5–5.0)
Alkaline Phosphatase: 45 U/L (ref 38–126)
Bilirubin, Direct: 1 mg/dL — ABNORMAL HIGH (ref 0.0–0.2)
Indirect Bilirubin: 1.4 mg/dL — ABNORMAL HIGH (ref 0.3–0.9)
Total Bilirubin: 2.4 mg/dL — ABNORMAL HIGH (ref 0.3–1.2)
Total Protein: 6.8 g/dL (ref 6.5–8.1)

## 2023-05-09 LAB — AMMONIA: Ammonia: 83 umol/L — ABNORMAL HIGH (ref 9–35)

## 2023-05-09 LAB — CULTURE, BODY FLUID W GRAM STAIN -BOTTLE

## 2023-05-09 LAB — HCG, QUANTITATIVE, PREGNANCY: hCG, Beta Chain, Quant, S: 31 m[IU]/mL — ABNORMAL HIGH (ref <5)

## 2023-05-09 MED ORDER — POTASSIUM CHLORIDE 10 MEQ/100ML IV SOLN
10.0000 meq | INTRAVENOUS | Status: AC
  Administered 2023-05-09 (×4): 10 meq via INTRAVENOUS
  Filled 2023-05-09 (×4): qty 100

## 2023-05-09 NOTE — Progress Notes (Signed)
Mallory Mendoza, M.D. Gastroenterology & Hepatology   Interval History:  Patient was transferred to ICU to start Levophed drip, maps have been maintained above 65 mmHg. Per nursing staff, she was more conversant in the morning and was talking to her family members.  Now she is trying to stay asleep.  No more vomiting or nausea during this shift.  No melena or hematochezia. Labs showed persistently low hemoglobin at 7.3.  hCG today in the morning was still mildly elevated at 31.  Inpatient Medications:  Current Facility-Administered Medications:    0.9 %  sodium chloride infusion (Manually program via Guardrails IV Fluids), , Intravenous, Once, Vassie Loll, MD, Held at 05/04/23 1436   0.9 %  sodium chloride infusion (Manually program via Guardrails IV Fluids), , Intravenous, Once, Vassie Loll, MD   0.9 %  sodium chloride infusion, 250 mL, Intravenous, Continuous, Vassie Loll, MD, Last Rate: 10 mL/hr at 05/08/23 1600, 250 mL at 05/08/23 1600   acetaminophen (TYLENOL) tablet 650 mg, 650 mg, Oral, Q6H PRN **OR** acetaminophen (TYLENOL) suppository 650 mg, 650 mg, Rectal, Q6H PRN, Vassie Loll, MD   camphor-menthol Wynelle Fanny) lotion, , Topical, PRN, Vassie Loll, MD, Given at 05/06/23 0940   Chlorhexidine Gluconate Cloth 2 % PADS 6 each, 6 each, Topical, Daily, Vassie Loll, MD, 6 each at 05/09/23 0917   feeding supplement (ENSURE ENLIVE / ENSURE PLUS) liquid 237 mL, 237 mL, Oral, BID BM, Vassie Loll, MD, 237 mL at 05/06/23 1132   folic acid (FOLVITE) tablet 1 mg, 1 mg, Oral, Daily, Vassie Loll, MD, 1 mg at 05/09/23 1610   HYDROmorphone (DILAUDID) injection 0.5 mg, 0.5 mg, Intravenous, Q6H PRN, Vassie Loll, MD   hydrOXYzine (ATARAX) tablet 25 mg, 25 mg, Oral, Q6H PRN, Vassie Loll, MD, 25 mg at 05/04/23 2209   lactulose (CHRONULAC) 10 GM/15ML solution 30 g, 30 g, Oral, TID, Vassie Loll, MD, 30 g at 05/09/23 9604   melatonin tablet 3 mg, 3 mg, Oral, QHS, Vassie Loll,  MD, 3 mg at 05/08/23 2202   midodrine (PROAMATINE) tablet 15 mg, 15 mg, Oral, TID WC, Vassie Loll, MD, 15 mg at 05/09/23 5409   mirtazapine (REMERON) tablet 7.5 mg, 7.5 mg, Oral, QHS, Vassie Loll, MD, 7.5 mg at 05/08/23 2201   multivitamin with minerals tablet 1 tablet, 1 tablet, Oral, Daily, Vassie Loll, MD, 1 tablet at 05/09/23 8119   nicotine (NICODERM CQ - dosed in mg/24 hours) patch 14 mg, 14 mg, Transdermal, Daily, Vassie Loll, MD, 14 mg at 05/09/23 1478   norepinephrine (LEVOPHED) 4mg  in (0.016 mg/mL) premix infusion, 2-10 mcg/min, Intravenous, Titrated, Vassie Loll, MD, Last Rate: 7.5 mL/hr at 05/09/23 2956, 2 mcg/min at 05/09/23 2130   octreotide (SANDOSTATIN) injection 200 mcg, 200 mcg, Subcutaneous, TID, Vassie Loll, MD, 200 mcg at 05/09/23 0906   ondansetron (ZOFRAN) tablet 4 mg, 4 mg, Oral, Q6H PRN **OR** ondansetron (ZOFRAN) injection 4 mg, 4 mg, Intravenous, Q6H PRN, Vassie Loll, MD, 4 mg at 05/08/23 1445   oxyCODONE (Oxy IR/ROXICODONE) immediate release tablet 5 mg, 5 mg, Oral, Q4H PRN, Vassie Loll, MD, 5 mg at 05/08/23 2201   pantoprazole (PROTONIX) EC tablet 40 mg, 40 mg, Oral, BID, Vassie Loll, MD, 40 mg at 05/09/23 0906   potassium chloride 10 mEq in 100 mL IVPB, 10 mEq, Intravenous, Q1 Hr x 4, Zierle-Ghosh, Asia B, DO, Last Rate: 100 mL/hr at 05/09/23 0918, 10 mEq at 05/09/23 0918   prochlorperazine (COMPAZINE) injection 10 mg, 10 mg, Intravenous, Q6H PRN, Gwenlyn Perking,  Mikle Bosworth, MD, 10 mg at 05/08/23 2213   rifaximin (XIFAXAN) tablet 550 mg, 550 mg, Oral, BID, Vassie Loll, MD, 550 mg at 05/09/23 1610   sodium bicarbonate tablet 1,300 mg, 1,300 mg, Oral, TID, Vassie Loll, MD, 1,300 mg at 05/09/23 9604   ursodiol (ACTIGALL) capsule 300 mg, 300 mg, Oral, TID, Vassie Loll, MD, 300 mg at 05/09/23 5409   zinc sulfate capsule 220 mg, 220 mg, Oral, Daily, Vassie Loll, MD, 220 mg at 05/09/23 8119   I/O    Intake/Output Summary (Last 24 hours) at  05/09/2023 0951 Last data filed at 05/09/2023 0918 Gross per 24 hour  Intake 1133.15 ml  Output --  Net 1133.15 ml     Physical Exam: Temp:  [97.9 F (36.6 C)-98.9 F (37.2 C)] 98.4 F (36.9 C) (06/23 0734) Pulse Rate:  [72-107] 87 (06/23 0930) Resp:  [11-47] 18 (06/23 0930) BP: (91-118)/(52-69) 109/63 (06/23 0930) SpO2:  [99 %-100 %] 100 % (06/23 0930) Weight:  [51.4 kg] 51.4 kg (06/23 0514)  Temp (24hrs), Avg:98.5 F (36.9 C), Min:97.9 F (36.6 C), Max:98.9 F (37.2 C) GENERAL: The patient is Awake but OX2, somnolentin no acute distress. HEENT: Head is normocephalic and atraumatic. EOMI are intact. Mouth is well hydrated and without lesions. NECK: Supple. No masses LUNGS: Clear to auscultation. No presence of rhonchi/wheezing/rales. Adequate chest expansion HEART: RRR, normal s1 and s2. ABDOMEN: Soft, nontender, no guarding, no peritoneal signs.  Moderate distention but not tense.  BS +. No masses. EXTREMITIES: Without any cyanosis, clubbing, rash, lesions or edema. NEUROLOGIC: AOx2, no focal motor deficit.  Has mild asterixis. SKIN: no jaundice, no rashes  Laboratory Data: CBC:     Component Value Date/Time   WBC 9.6 05/09/2023 0932   RBC 2.43 (L) 05/09/2023 0932   HGB 7.3 (L) 05/09/2023 0932   HCT 20.4 (L) 05/09/2023 0932   PLT 159 05/09/2023 0932   MCV 84.0 05/09/2023 0932   MCH 30.0 05/09/2023 0932   MCHC 35.8 05/09/2023 0932   RDW 15.2 05/09/2023 0932   LYMPHSABS 2.0 05/04/2023 0837   MONOABS 1.2 (H) 05/04/2023 0837   EOSABS 0.2 05/04/2023 0837   BASOSABS 0.0 05/04/2023 0837   COAG:  Lab Results  Component Value Date   INR 2.2 (H) 05/08/2023   INR 2.1 (H) 05/07/2023   INR 1.8 (H) 05/05/2023    BMP:     Latest Ref Rng & Units 05/09/2023    4:06 AM 05/08/2023    4:53 AM 05/07/2023    4:38 AM  BMP  Glucose 70 - 99 mg/dL 147  829  562   BUN 6 - 20 mg/dL 52  47  44   Creatinine 0.44 - 1.00 mg/dL 1.30  8.65  7.84   Sodium 135 - 145 mmol/L 138  135  129    Potassium 3.5 - 5.1 mmol/L 3.1  3.3  3.1   Chloride 98 - 111 mmol/L 107  107  102   CO2 22 - 32 mmol/L 13  13  13    Calcium 8.9 - 10.3 mg/dL 69.6  29.5  9.3     HEPATIC:     Latest Ref Rng & Units 05/09/2023    4:06 AM 05/08/2023    4:53 AM 05/07/2023    4:38 AM  Hepatic Function  Total Protein 6.5 - 8.1 g/dL  6.3  6.0   Albumin 3.5 - 5.0 g/dL 5.3  5.2  4.5   AST 15 - 41 U/L  14  16  ALT 0 - 44 U/L  9  11   Alk Phosphatase 38 - 126 U/L  47  54   Total Bilirubin 0.3 - 1.2 mg/dL  2.6  2.2     CARDIAC: No results found for: "CKTOTAL", "CKMB", "CKMBINDEX", "TROPONINI"    Imaging: I personally reviewed and interpreted the available labs, imaging and endoscopic files.   Assessment/Plan: 42 year old female with history of anxiety, depression, decompensated alcoholic cirrhosis, PBC, alcoholic hepatitis, hepatic encephalopathy, recurrent ascites, CKD, who was admitted after presenting worsening anemia without significant GI bleeding.  Patient was found to have significant drop in her hemoglobin but no melena or hematochezia.   The patient has presented worsening mental status during the current hospitalization along with worsening AKI on CKD.  Nephrology is currently following her and she is receiving treatment with midodrine and octreotide, as well as albumin with very poor improvement of her renal function.  This has been concerning for possible hepatorenal syndrome which has been poorly responsive. Due to this, decision was made to transfer her to ICU to improve her mean arterial pressures with Levophed.  No emergent indication for hemodialysis per nephrology but if she were to worsen she may need to be transferred to [facility] for CRRT.   Her mental status has been difficult to manage despite using Xifaxan, lactulose and zinc.  She has been presenting some improvement today, will need to continue aggressive management of encephalopathy.  I will discontinue her Remeron for now, will need to  minimize the use of opiates or other sedatives.  Chest x-ray was negative for infection yesterday.  Urinalysis has not been obtained.  Present disease was negative for SBP. may have low threshold to start her on broad-spectrum antibiotics given the possible risk of concomitant infection.   Of note, her MELD 3.0 has been persistently elevated above 30, with last value of 32 yesterday.  We will update her MELD labs today.  Overall, I am concerned about acute on chronic liver failure.  I discussed the case yesterday with Dr.  Titus Dubin from [facility] transplant hepatology. Given her worsening mental status, it will be difficult to determine the patient's psychosocial status prior to considering her for emergent liver transplant evaluation. Stated that if her mental status were to improve, her evaluation could be reconsidered in which she will reach them again.   I discussed this with her son in the past.  Also, it was noted that her hCG is still positive at a very low titer.  I do consider this is less likely related to pregnancy but we will need to obtain GYN input to clear her in case she gets eventually transferred for emergent liver transplant valuation.   Guarded prognosis   May discuss with [facility] transplant center for possible transfer if her mental status improves Continue octreotide and midodrine for possible HRS Continue ICU monitoring and Levophed drip to maintain MAPs above 65 appreciate ongoing nephrology input Daily CBC, CMP, INR PPI BID Antiemetics as needed.  2g sodium diet Continue Xifaxan 550 mg twice daily Continue zinc 220 mg daily Continue to hold nadolol and Lasix Continue lactulose 30 g TID, titrate to achieve 2-3 bowel movements per day Stop Remeron GYN evaluation to obtain clearance for elevated hCG   Mallory Blazing, MD Gastroenterology and Hepatology [facility]

## 2023-05-09 NOTE — Progress Notes (Signed)
Progress Note   Patient: Mallory Mendoza ZOX:[id number] DOB: 10/22/1981 DOA: 05/04/2023     5 DOS: the patient was seen and examined on 05/09/2023   Brief hospital admission narrative course: As per H&P written by Dr. Sherryll Burger on 05/05/2023 Mallory Mendoza is a 42 y.o. female with medical history significant for alcoholic liver cirrhosis with recurrent ascites requiring frequent paracentesis, recurrent GI bleed, history of anxiety and depression, chronic anemia, and recent discharge for menorrhagia with acute on chronic anemia requiring 1 unit PRBC transfusion who presents to the ED with abnormal labs that were drawn by GI.  She was noted to have a hemoglobin of 5.8 and worsening creatinine levels up to 5.17.  It appears that her creatinine has recently been upward trending on last admission up to around 2-2.5.  She is also noted to have chronic hyponatremia.  She complains of feeling somewhat fatigued and has chronic abdominal pain related to her abdominal distention.  She continues to have some mild vaginal bleeding and has not followed up with gynecology since her recent discharge and is not currently on Megace.  Additionally she has been complaining of shivering and chills and feels quite cold and uses several blankets every day to help warm up.   ED Course: Vital signs stable and patient is afebrile.  Hemoglobin 5.8 with 2 units PRBCs ordered.  Creatinine 5.17 with some metabolic acidosis discussion had with nephrology with no emergent need for hemodialysis, but they will follow and routine consultation.  GI to follow inpatient.  Ammonia is 76, but she is mentating well.  Stool occult study negative.  Assessment and Plan: 1-acute on chronic symptomatic anemia -In the setting of ongoing vaginal bleeding -Status post 2 unit PRBC transfusion this admission. -Fecal occult blood test negative -Continue patient follow-up with Dr. Despina Hidden as an outpatient -if needed will give depo shot IM -Hemoglobin 7.3 and  is stable, continue to monitor; plan to transfuse for Hgb less than 7.  2-worsening acute kidney injury with metabolic acidosis -With concern for hepatorenal syndrome -Continue to follow nephrology service recommendations. -Continue to maintain adequate hydration -Continue to avoid nephrotoxic agent -Continue sodium bicarbonate, PPI, octreotide, midodrine and albumin -Follow renal function trend. -Fluid restriction to 1.5 L daily -Patient's midodrine dose has been adjusted to 15 mg 3 times a day given low BP and MAP; will continue to follow nephrology service recommendation. -Cr has continue to worsen and currently up to 7.36, electrolyte and BUN ok. If HD needed will require CRRT given BP.  3-alcoholic liver cirrhosis with portal hypertension and recurring ascites -Echo will be done to facilitate/accelerate TIPS procedure as an outpatient; will follow results. -Continue to follow GI service recommendation -Continue PPI -Continue lactulose at adjusted dosages and follow ammonia level. -Normal mentation at the moment; given she is slow to response demonstrated overall good insight. -Outpatient endoscopic evaluation anticipated. -No signs of active bleeding currently. -Gastroenterology service was called for SBP; so far reports no suggestion of active infection. -Given decrease in MAP and further worsening renal function patient was transferred to ICU for Levophed therapy. -She continued demonstrating worsening hepatorenal syndrome and at this moment GI service has current touch with transplant service at [facility] for further recommendations and potential transfer.  4-chronic pain syndrome -Continue as needed analgesics -Continue to avoid the use of NSAIDs.  5-hyponatremia -Appears to be chronic in the setting of renal failure and liver cirrhosis with chronic diuretic usage. -currently 135 -Continue to follow electrolytes trend and follow nephrology recommendations.  6-history of  anxiety/depression -Overall stable mood -No suicidal ideation or hallucination -Continue current anxiolytic/antidepressant regimen.  7-tobacco abuse -Cessation counseling provided -Continue nicotine patch. -Patient was receptive to counseling  8-insomnia -Continue supportive care -Sleep hygiene and qhs melatonin discussed with patient.  9-positive beta-hCG -Patient reports unprotected sexual intercourse or concern for pregnancy -Qualitative labels and positive results possible associated with known ovarian cyst.  Subjective:  Reporting decrease in urine output; no fever, no chest pain, no shortness of breath.  Renal function continue worsening.  After initiation of Levophed there have been increased in overall blood pressure and MAP.  Physical Exam: Vitals:   05/09/23 1645 05/09/23 1646 05/09/23 1700 05/09/23 1715  BP: 113/73 113/73 116/66 117/66  Pulse: 93 88 97 94  Resp: 10 15 16 13   Temp:  98.7 F (37.1 C)    TempSrc:  Oral    SpO2: 99% 100% 100% 100%  Weight:      Height:       General exam: Alert, awake, oriented x 3; slow to response but more alert and interactive.  Reported intermittent abdominal pain and denying chest pain or shortness of breath.  Patient is afebrile. Respiratory system: No wheezing, no crackles, good saturation on room air. Cardiovascular system:RRR. No rubs or gallops; no JVD. Gastrointestinal system: Abdomen is just tended, diffusely/basically tender on palpation, positive bowel sounds, positive fluid waves. Central nervous system: No focal neurological deficits.  Decreased/improved asterixis Extremities: No cyanosis, clubbing or edema. Skin: No petechiae. Psychiatry: Judgement and insight appear normal.  Flat affect.  Udgement and insight appear normal.  Flat affect appreciated.  Data Reviewed: CBC: WBCs 9.6, hemoglobin 7.3 and platelet count 159 K Ammonia: 83 Renal function panel: Sodium 138, potassium 3.1, chloride 107, bicarb 13, BUN 52,  creatinine 7.36, calcium 11.2 and GFR 7 Hepatic function panel: Total protein 6.8, AST 13, ALT 9, alkaline phosphatase 45, total bilirubin 2.4 Hcg quantitative: 31  Family Communication: Mother at bedside.  Disposition: Status is: Inpatient Remains inpatient appropriate because: Continue treatment for acute on chronic renal failure.   Planned Discharge Destination: Home  Time spent: 50 minutes  Author: Vassie Loll, MD 05/09/2023 5:38 PM  For on call review [email or web address].

## 2023-05-09 NOTE — Progress Notes (Signed)
Brief Progress Note:  Reviewed notes, labs, UOP.   Cr continues to rise MS not great UOP not documented fully On Levophed in the ICU now.   Emergent transplant difficult d/t inability to assess psychosocial situation--> per GI notes.   Does not appear that there is an emergent indication for renal replacement-- however if transplant is a possibility then would recommend transfer to [facility] for CRRT.  If she does not qualify for transplant- prognosis would be poor in the setting of HRS and I would advocate for a palliative care c/s.   Please feel free to contact me with questions or concerns. I am happy to assist.  Bufford Buttner MD [facility] [phone]

## 2023-05-10 DIAGNOSIS — D649 Anemia, unspecified: Secondary | ICD-10-CM | POA: Diagnosis not present

## 2023-05-10 DIAGNOSIS — K7031 Alcoholic cirrhosis of liver with ascites: Secondary | ICD-10-CM | POA: Diagnosis not present

## 2023-05-10 DIAGNOSIS — K767 Hepatorenal syndrome: Secondary | ICD-10-CM | POA: Diagnosis not present

## 2023-05-10 DIAGNOSIS — E871 Hypo-osmolality and hyponatremia: Secondary | ICD-10-CM | POA: Diagnosis not present

## 2023-05-10 DIAGNOSIS — K766 Portal hypertension: Secondary | ICD-10-CM | POA: Diagnosis not present

## 2023-05-10 LAB — CBC
HCT: 20.8 % — ABNORMAL LOW (ref 36.0–46.0)
Hemoglobin: 7.2 g/dL — ABNORMAL LOW (ref 12.0–15.0)
MCH: 29.9 pg (ref 26.0–34.0)
MCHC: 34.6 g/dL (ref 30.0–36.0)
MCV: 86.3 fL (ref 80.0–100.0)
Platelets: 165 10*3/uL (ref 150–400)
RBC: 2.41 MIL/uL — ABNORMAL LOW (ref 3.87–5.11)
RDW: 15.5 % (ref 11.5–15.5)
WBC: 9.9 10*3/uL (ref 4.0–10.5)
nRBC: 0 % (ref 0.0–0.2)

## 2023-05-10 LAB — COMPREHENSIVE METABOLIC PANEL
ALT: 9 U/L (ref 0–44)
AST: 14 U/L — ABNORMAL LOW (ref 15–41)
Albumin: 4.9 g/dL (ref 3.5–5.0)
Alkaline Phosphatase: 58 U/L (ref 38–126)
Anion gap: 13 (ref 5–15)
BUN: 51 mg/dL — ABNORMAL HIGH (ref 6–20)
CO2: 14 mmol/L — ABNORMAL LOW (ref 22–32)
Calcium: 11 mg/dL — ABNORMAL HIGH (ref 8.9–10.3)
Chloride: 105 mmol/L (ref 98–111)
Creatinine, Ser: 7.38 mg/dL — ABNORMAL HIGH (ref 0.44–1.00)
GFR, Estimated: 7 mL/min — ABNORMAL LOW (ref >60)
Glucose, Bld: 118 mg/dL — ABNORMAL HIGH (ref 70–99)
Potassium: 3.1 mmol/L — ABNORMAL LOW (ref 3.5–5.1)
Sodium: 132 mmol/L — ABNORMAL LOW (ref 135–145)
Total Bilirubin: 1.7 mg/dL — ABNORMAL HIGH (ref 0.3–1.2)
Total Protein: 6.3 g/dL — ABNORMAL LOW (ref 6.5–8.1)

## 2023-05-10 LAB — PH, BODY FLUID: pH, Body Fluid: 7.3

## 2023-05-10 LAB — PROTIME-INR
INR: 2.2 — ABNORMAL HIGH (ref 0.8–1.2)
Prothrombin Time: 24.4 seconds — ABNORMAL HIGH (ref 11.4–15.2)

## 2023-05-10 LAB — CULTURE, BODY FLUID W GRAM STAIN -BOTTLE

## 2023-05-10 MED ORDER — NICOTINE 14 MG/24HR TD PT24: 14.0000 mg | MEDICATED_PATCH | Freq: Every day | TRANSDERMAL | Status: DC

## 2023-05-10 MED ORDER — LACTULOSE 10 GM/15ML PO SOLN: 30.0000 g | Freq: Three times a day (TID) | ORAL | Status: DC

## 2023-05-10 MED ORDER — ZINC SULFATE 220 (50 ZN) MG PO CAPS: 220.0000 mg | ORAL_CAPSULE | Freq: Every day | ORAL | Status: DC

## 2023-05-10 MED ORDER — PROCHLORPERAZINE EDISYLATE 10 MG/2ML IJ SOLN: 10.0000 mg | Freq: Four times a day (QID) | INTRAMUSCULAR | Status: DC | PRN

## 2023-05-10 MED ORDER — MIDODRINE HCL 5 MG PO TABS: 15.0000 mg | ORAL_TABLET | Freq: Three times a day (TID) | ORAL | Status: DC

## 2023-05-10 MED ORDER — STERILE WATER FOR INJECTION IV SOLN
INTRAVENOUS | Status: DC
  Filled 2023-05-10 (×2): qty 1000

## 2023-05-10 MED ORDER — OCTREOTIDE ACETATE 100 MCG/ML IJ SOLN: 200.0000 ug | Freq: Three times a day (TID) | INTRAMUSCULAR | Status: DC

## 2023-05-10 MED ORDER — CAMPHOR-MENTHOL 0.5-0.5 % EX LOTN: TOPICAL_LOTION | CUTANEOUS | 0 refills | Status: DC | PRN

## 2023-05-10 MED ORDER — GABAPENTIN 100 MG PO CAPS: 100.0000 mg | ORAL_CAPSULE | Freq: Every day | ORAL | Status: DC

## 2023-05-10 MED ORDER — ADULT MULTIVITAMIN W/MINERALS CH: 1.0000 | ORAL_TABLET | Freq: Every day | ORAL | Status: DC

## 2023-05-10 MED ORDER — ENSURE ENLIVE PO LIQD: 237.0000 mL | Freq: Two times a day (BID) | ORAL | 12 refills | Status: DC

## 2023-05-10 MED ORDER — NOREPINEPHRINE 4 MG/250ML-% IV SOLN: 2.0000 ug/min | INTRAVENOUS | Status: DC

## 2023-05-10 NOTE — Discharge Summary (Signed)
Physician Discharge Summary   Patient: Mallory Mendoza MRN: [id number] DOB: [birth year]  Admit date:     05/04/2023  Discharge date: 05/10/23  Discharge Physician: Vassie Loll   PCP: Benetta Spar, MD   Recommendations at discharge:  Continue to follow hemoglobin trend and transfuse for hemoglobin less than 7 Repeat complete metabolic panel to follow electrolytes, renal function and LFTs. Further recommendations, care and interventions to be dictated by transplant hepatology service.  Discharge Diagnoses: Principal Problem:   Acute anemia Active Problems:   AKI (acute kidney injury) (HCC)   Alcoholic cirrhosis (HCC)   Dermoid cyst of right ovary   Hyponatremia   GERD without esophagitis   Portal hypertensive gastropathy (HCC)   Hepatorenal syndrome (HCC)   Arterial hypotension  Brief hospital admission narrative course: As per H&P written by Dr. Sherryll Burger on 05/05/2023 Mallory Mendoza is a 42 y.o. female with medical history significant for alcoholic liver cirrhosis with recurrent ascites requiring frequent paracentesis, recurrent GI bleed, history of anxiety and depression, chronic anemia, and recent discharge for menorrhagia with acute on chronic anemia requiring 1 unit PRBC transfusion who presents to the ED with abnormal labs that were drawn by GI.  She was noted to have a hemoglobin of 5.8 and worsening creatinine levels up to 5.17.  It appears that her creatinine has recently been upward trending on last admission up to around 2-2.5.  She is also noted to have chronic hyponatremia.  She complains of feeling somewhat fatigued and has chronic abdominal pain related to her abdominal distention.  She continues to have some mild vaginal bleeding and has not followed up with gynecology since her recent discharge and is not currently on Megace.  Additionally she has been complaining of shivering and chills and feels quite cold and uses several blankets every day to help warm up.    ED Course: Vital signs stable and patient is afebrile.  Hemoglobin 5.8 with 2 units PRBCs ordered.  Creatinine 5.17 with some metabolic acidosis discussion had with nephrology with no emergent need for hemodialysis, but they will follow and routine consultation.  GI to follow inpatient.  Ammonia is 76, but she is mentating well.  Stool occult study negative.  Assessment and Plan: 1-acute on chronic symptomatic anemia -In the setting of ongoing vaginal bleeding -Status post 2 unit PRBC transfusion this admission. -Fecal occult blood test negative -Continue patient follow-up with Dr. Despina Hidden as an outpatient -if needed will give depo shot IM -Hemoglobin 7.2 and is stable, continue to monitor; plan to transfuse for Hgb less than 7.   2-worsening acute kidney injury with metabolic acidosis -With concern for hepatorenal syndrome -Continue to follow nephrology service recommendations. -Continue to maintain adequate hydration -Continue to avoid nephrotoxic agent -Continue sodium bicarbonate infusion, PPI, octreotide, midodrine and intermittent albumin therapy. -Follow renal function trend. -Fluid restriction to 2.000 L daily -Patient's midodrine dose has been adjusted to 15 mg 3 times a day given low BP and MAP; will continue to follow nephrology service recommendation. -Cr has continue to worsen and currently up to 7.38, electrolyte and BUN ok. If HD needed will require CRRT given BP.   3-alcoholic liver cirrhosis with portal hypertension and recurring ascites -Echo will be done to facilitate/accelerate TIPS procedure as an outpatient; will follow results. -Continue to follow GI service recommendation -Continue PPI -Continue lactulose at adjusted dosages and follow ammonia level. -Normal mentation at the moment; given she is slow to response demonstrated overall good insight. -Outpatient endoscopic evaluation anticipated. -  No signs of active bleeding currently. -Gastroenterology service was  called for SBP; so far reports no suggestion of active infection. -Given decrease in MAP and further worsening renal function patient has been transferred to ICU for Levophed therapy. -She continued demonstrating worsening hepatorenal syndrome and at this moment GI service has touch basis with transplant service at [facility] for further recommendations and potential transfer and future care.   4-chronic pain syndrome -Continue as needed analgesics -Continue to avoid the use of NSAIDs.   5-hyponatremia -Appears to be chronic in the setting of renal failure and liver cirrhosis with chronic diuretic usage. -currently 135 -Continue to follow electrolytes trend and follow nephrology recommendations.   6-history of anxiety/depression -Overall stable mood -No suicidal ideation or hallucination -Continue current anxiolytic/antidepressant regimen.   7-tobacco abuse -Cessation counseling provided -Continue nicotine patch. -Patient was receptive to counseling   8-insomnia -Continue supportive care -Sleep hygiene and qhs melatonin discussed with patient.   9-positive beta-hCG -Patient reports unprotected sexual intercourse or concern for pregnancy -Qualitative labels and positive results possible associated with known ovarian cyst.  Consultants: Gastroenterology service, nephrology, transplant hepatology Procedures performed: See below for x-ray reports. Disposition: Transfer to [facility] service for further evaluation and management. Diet recommendation: Low-sodium diet.  DISCHARGE MEDICATION: Allergies as of 05/10/2023   No Known Allergies      Medication List     STOP taking these medications    furosemide 40 MG tablet Commonly known as: LASIX   mirtazapine 7.5 MG tablet Commonly known as: REMERON   nadolol 20 MG tablet Commonly known as: CORGARD   potassium chloride 10 MEQ tablet Commonly known as: KLOR-CON   sodium bicarbonate 650 MG tablet   spironolactone 100 MG  tablet Commonly known as: ALDACTONE       TAKE these medications    camphor-menthol lotion Commonly known as: SARNA Apply topically as needed for itching.   feeding supplement Liqd Take 237 mLs by mouth 2 (two) times daily between meals. Start taking on: May 11, 2023   folic acid 1 MG tablet Commonly known as: FOLVITE Take 1 tablet (1 mg total) by mouth daily.   gabapentin 100 MG capsule Commonly known as: NEURONTIN Take 1 capsule (100 mg total) by mouth at bedtime. What changed: when to take this   hydrOXYzine 25 MG tablet Commonly known as: ATARAX Take 1 tablet (25 mg total) by mouth every 6 (six) hours as needed for itching. What changed:  how much to take when to take this   lactulose 10 GM/15ML solution Commonly known as: CHRONULAC Take 45 mLs (30 g total) by mouth 3 (three) times daily. What changed: when to take this   midodrine 5 MG tablet Commonly known as: PROAMATINE Take 3 tablets (15 mg total) by mouth 3 (three) times daily with meals. Start taking on: May 11, 2023   multivitamin with minerals Tabs tablet Take 1 tablet by mouth daily. Start taking on: May 11, 2023   nicotine 14 mg/24hr patch Commonly known as: NICODERM CQ - dosed in mg/24 hours Place 1 patch (14 mg total) onto the skin daily. Start taking on: May 11, 2023   norepinephrine 4-5 MG/250ML-% Soln Commonly known as: LEVOPHED Inject 2-10 mcg/min into the vein continuous.   octreotide 100 MCG/ML Soln injection Commonly known as: SANDOSTATIN Inject 2 mLs (200 mcg total) into the skin 3 (three) times daily.   ondansetron 8 MG disintegrating tablet Commonly known as: ZOFRAN-ODT Take 8 mg by mouth every 8 (eight) hours  as needed for vomiting or nausea.   pantoprazole 40 MG tablet Commonly known as: Protonix Take 1 tablet (40 mg total) by mouth 2 (two) times daily.   prochlorperazine 10 MG/2ML injection Commonly known as: COMPAZINE Inject 2 mLs (10 mg total) into the vein every  6 (six) hours as needed.   rifaximin 550 MG Tabs tablet Commonly known as: XIFAXAN Take 1 tablet (550 mg total) by mouth 2 (two) times daily.   ursodiol 300 MG capsule Commonly known as: ACTIGALL Take 1 capsule (300 mg total) by mouth 3 (three) times daily.   zinc sulfate 220 (50 Zn) MG capsule Take 1 capsule (220 mg total) by mouth daily. Start taking on: May 11, 2023        Discharge Exam: Ceasar Mons Weights   05/05/23 0428 05/09/23 0514 05/10/23 0432  Weight: 53.2 kg 51.4 kg 53.1 kg   General exam: Alert, awake, oriented x 3; afebrile, interactive and in not acute distress. Reported intermittent abdominal pain and denying chest pain or shortness of breath.   Respiratory system: No wheezing, no crackles, good saturation on room air. Cardiovascular system:RRR. No rubs or gallops; no JVD. Gastrointestinal system: Abdomen is just tended, diffusely/basically tender on palpation, positive bowel sounds, positive fluid waves. Central nervous system: No focal neurological deficits.  Decreased/improved asterixis Extremities: No cyanosis, clubbing or edema. Skin: No petechiae. Psychiatry: Judgement and insight appear normal.  Flat affect.  Udgement and insight appear normal.  Flat affect appreciated.  Condition at discharge: stable  The results of significant diagnostics from this hospitalization (including imaging, microbiology, ancillary and laboratory) are listed below for reference.   Imaging Studies: DG CHEST PORT 1 VIEW  Result Date: 05/08/2023 CLINICAL DATA:  Central line placement. EXAM: PORTABLE CHEST 1 VIEW COMPARISON:  May 08, 2023 FINDINGS: Since the prior study there is been interval right internal jugular venous catheter placement. Its distal tip sits approximally 2.2 cm distal to the junction of the superior vena cava and right atrium. The heart size and mediastinal contours are within normal limits. Low lung volumes are noted. Mild atelectasis is seen within the left lung  base. There is no evidence of focal consolidation, pleural effusion or pneumothorax. The visualized skeletal structures are unremarkable. IMPRESSION: 1. Right internal jugular venous catheter positioning, as described above. 2. Low lung volumes with mild left basilar atelectasis. Electronically Signed   By: Aram Candela M.D.   On: 05/08/2023 18:45   DG CHEST PORT 1 VIEW  Result Date: 05/08/2023 CLINICAL DATA:  Shortness of breath EXAM: PORTABLE CHEST 1 VIEW COMPARISON:  None Available. FINDINGS: Cardiac shadow is within normal limits. Lungs are well aerated bilaterally. No focal infiltrate or effusion is seen. No bony abnormality is noted. IMPRESSION: No active disease. Electronically Signed   By: Alcide Clever M.D.   On: 05/08/2023 11:42   US Paracentesis  Result Date: 05/07/2023 INDICATION: 201095 SBP (spontaneous bacterial peritonitis) (HCC) 201095 EXAM: ULTRASOUND GUIDED DIAGNOSTIC and THERAPEUTIC PARACENTESIS MEDICATIONS: 10 mL lidocaine 2 % COMPLICATIONS: None immediate. PROCEDURE: Informed written consent was obtained from the patient after a discussion of the risks, benefits and alternatives to treatment. A timeout was performed prior to the initiation of the procedure. Initial ultrasound scanning demonstrates a large amount of ascites within the right lower abdominal quadrant. The right lower abdomen was prepped and draped in the usual sterile fashion. 1% lidocaine was used for local anesthesia. Following this, a 6 Fr Safe-T-Centesis catheter was introduced. An ultrasound image was saved for documentation purposes.  The paracentesis was performed. The catheter was removed and a dressing was applied. The patient tolerated the procedure well without immediate post procedural complication. FINDINGS: A total of approximately 4.2 L of bilious-tinged, serous peritoneal fluid was removed. Samples were sent to the laboratory as requested by the clinical team. IMPRESSION: Successful diagnostic and  therapeutic paracentesis yielding 4.2 L liters of peritoneal fluid. PLAN: The patient has previously been formally evaluated by the [facility] Interventional Radiology Portal Hypertension Clinic and is being actively followed for potential future intervention. Roanna Banning, MD [facility] Radiology Electronically Signed   By: Roanna Banning M.D.   On: 05/07/2023 16:39   CT ABDOMEN PELVIS WO CONTRAST  Result Date: 05/06/2023 CLINICAL DATA:  Abdominal pain and distention. EXAM: CT ABDOMEN AND PELVIS WITHOUT CONTRAST TECHNIQUE: Multidetector CT imaging of the abdomen and pelvis was performed following the standard protocol without IV contrast. RADIATION DOSE REDUCTION: This exam was performed according to the departmental dose-optimization program which includes automated exposure control, adjustment of the mA and/or kV according to patient size and/or use of iterative reconstruction technique. COMPARISON:  December 20, 2022. FINDINGS: Lower chest: Small left pleural effusion is noted. Hepatobiliary: No cholelithiasis or biliary dilatation is noted. Slightly nodular hepatic margins are noted suggesting hepatic cirrhosis. Pancreas: Unremarkable. No pancreatic ductal dilatation or surrounding inflammatory changes. Spleen: Normal in size without focal abnormality. Adrenals/Urinary Tract: Adrenal glands are unremarkable. Kidneys are normal, without renal calculi, focal lesion, or hydronephrosis. Bladder is unremarkable. Stomach/Bowel: Stomach is within normal limits. Appendix appears normal. No evidence of bowel wall thickening, distention, or inflammatory changes. Vascular/Lymphatic: Aortic atherosclerosis. No enlarged abdominal or pelvic lymph nodes. Reproductive: Uterus and bilateral adnexa are unremarkable. Other: Moderate to severe ascites is noted.  No hernia is noted. Musculoskeletal: No acute or significant osseous findings. IMPRESSION: Moderate to severe ascites is  noted secondary to hepatic cirrhosis. Small left pleural effusion is noted. Aortic Atherosclerosis (ICD10-I70.0). Electronically Signed   By: Lupita Raider M.D.   On: 05/06/2023 18:08   ECHOCARDIOGRAM COMPLETE  Result Date: 05/06/2023    ECHOCARDIOGRAM REPORT   Patient Name:   RENIYA MCCLEES Date of Exam: 05/06/2023 Medical Rec #:  [id number]       Height:       64.0 in Accession #:    [id number]      Weight:       117.3 lb Date of Birth:  1981-08-03       BSA:          1.559 m Patient Age:    41 years        BP:           91/49 mmHg Patient Gender: F               HR:           94 bpm. [facility] Procedure: 2D Echo, Cardiac Doppler and Color Doppler Indications:    Other abnormalities of the heart R00.8  History:        Patient has prior history of Echocardiogram examinations, most                 recent 12/21/2022. Alcoholic cirrhosis of liver with ascites                 (HCC), Tobacco use disorder.  Sonographer:    Celesta Gentile RCS Referring Phys: [phone] Skyy Nilan IMPRESSIONS  1. Left ventricular ejection fraction, by estimation,  is 70 to 75%. The left ventricle has hyperdynamic function. The left ventricle has no regional wall motion abnormalities. There is mild left ventricular hypertrophy. Left ventricular diastolic parameters were normal.  2. Right ventricular systolic function is normal. The right ventricular size is normal. Tricuspid regurgitation signal is inadequate for assessing PA pressure.  3. The mitral valve is grossly normal. Trivial mitral valve regurgitation.  4. The aortic valve is tricuspid. Aortic valve regurgitation is not visualized. No aortic stenosis is present. Aortic valve mean gradient measures 8.0 mmHg.  5. The inferior vena cava is normal in size with greater than 50% respiratory variability, suggesting right atrial pressure of 3 mmHg. Comparison(s): Prior images reviewed side by side. LVEF vigorous at 70-75%. FINDINGS  Left Ventricle: Left ventricular ejection  fraction, by estimation, is 70 to 75%. The left ventricle has hyperdynamic function. The left ventricle has no regional wall motion abnormalities. The left ventricular internal cavity size was normal in size. There is mild left ventricular hypertrophy. Left ventricular diastolic parameters were normal. Right Ventricle: The right ventricular size is normal. No increase in right ventricular wall thickness. Right ventricular systolic function is normal. Tricuspid regurgitation signal is inadequate for assessing PA pressure. Left Atrium: Left atrial size was normal in size. Right Atrium: Right atrial size was normal in size. Pericardium: There is no evidence of pericardial effusion. Mitral Valve: The mitral valve is grossly normal. Trivial mitral valve regurgitation. Tricuspid Valve: The tricuspid valve is grossly normal. Tricuspid valve regurgitation is mild. Aortic Valve: The aortic valve is tricuspid. Aortic valve regurgitation is not visualized. No aortic stenosis is present. Aortic valve mean gradient measures 8.0 mmHg. Aortic valve peak gradient measures 18.0 mmHg. Aortic valve area, by VTI measures 2.22  cm. Pulmonic Valve: The pulmonic valve was grossly normal. Pulmonic valve regurgitation is trivial. Aorta: The aortic root is normal in size and structure. Venous: The inferior vena cava is normal in size with greater than 50% respiratory variability, suggesting right atrial pressure of 3 mmHg. IAS/Shunts: No atrial level shunt detected by color flow Doppler.  LEFT VENTRICLE PLAX 2D LVIDd:         4.20 cm   Diastology LVIDs:         1.80 cm   LV e' medial:    8.70 cm/s LV PW:         1.10 cm   LV E/e' medial:  12.9 LV IVS:        1.00 cm   LV e' lateral:   11.40 cm/s LVOT diam:     2.00 cm   LV E/e' lateral: 9.8 LV SV:         77 LV SV Index:   49 LVOT Area:     3.14 cm  RIGHT VENTRICLE RV S prime:     16.50 cm/s TAPSE (M-mode): 2.1 cm LEFT ATRIUM             Index        RIGHT ATRIUM           Index LA diam:         3.60 cm 2.31 cm/m   RA Area:     12.40 cm LA Vol (A2C):   38.9 ml 24.95 ml/m  RA Volume:   27.30 ml  17.51 ml/m LA Vol (A4C):   40.9 ml 26.23 ml/m LA Biplane Vol: 43.7 ml 28.02 ml/m  AORTIC VALVE AV Area (Vmax):    2.45 cm AV Area (Vmean):   2.42 cm  AV Area (VTI):     2.22 cm AV Vmax:           212.00 cm/s AV Vmean:          120.000 cm/s AV VTI:            0.345 m AV Peak Grad:      18.0 mmHg AV Mean Grad:      8.0 mmHg LVOT Vmax:         165.00 cm/s LVOT Vmean:        92.600 cm/s LVOT VTI:          0.244 m LVOT/AV VTI ratio: 0.71  AORTA Ao Root diam: 3.00 cm MITRAL VALVE MV Area (PHT): 2.17 cm     SHUNTS MV Decel Time: 349 msec     Systemic VTI:  0.24 m MV E velocity: 112.00 cm/s  Systemic Diam: 2.00 cm MV A velocity: 92.20 cm/s MV E/A ratio:  1.21 Nona Dell MD Electronically signed by Nona Dell MD Signature Date/Time: 05/06/2023/3:48:03 PM    Final    DG ESOPHAGUS W SINGLE CM (SOL OR THIN BA)  Result Date: 05/06/2023 CLINICAL DATA:  42 yo female with history of ETOH cirrhosis, recurrent GI bleed, anxiety, depression and menorrhagia admitted for anemia and AKI. Pt complains of worsening solid food and medication dysphagia. She denies previous esophagus or stomach surgeries. Pt referred for fluoroscopic esophagram study. EXAM: ESOPHAGUS/BARIUM SWALLOW/TABLET STUDY TECHNIQUE: Single contrast examination was performed using water soluble contrast. This exam was performed by Alex Gardener, NP, and was supervised and interpreted by Roanna Banning, MD. FLUOROSCOPY: Radiation Exposure Index (as provided by the fluoroscopic device): 20.20 mGy Kerma COMPARISON:  None Available. FINDINGS: Exam limited due to patients inability to stand or position independently on exam table. Swallowing: Appears normal. No vestibular penetration or aspiration seen. Pharynx: Unremarkable. Esophagus: Normal appearance. Esophageal motility: Within normal limits. Hiatal Hernia: None. Gastroesophageal reflux: Spontaneous  trace gastric reflux to distal esophagus without provocation Ingested 13mm barium tablet: Pt states she could not swallow tablet. Other: None. IMPRESSION: Exam limited due to patients inability to stand and position independently on exam table. Trace gastroesophageal reflux to distal esophagus. Otherwise, normal esophagram. Electronically Signed   By: Roanna Banning M.D.   On: 05/06/2023 11:17   US RENAL  Result Date: 05/05/2023 CLINICAL DATA:  Acute kidney injury EXAM: RENAL / URINARY TRACT ULTRASOUND COMPLETE COMPARISON:  Ascites ultrasound 04/30/2019. CT chest abdomen pelvis 12/20/2018 FINDINGS: Right Kidney: Renal measurements: 11.2 x 4.2 x 6.0 cm = volume: 148.4 mL. Echogenicity within normal limits. No mass or hydronephrosis visualized. Left Kidney: Renal measurements: 10.4 x 5.4 x 5.3 cm = volume: 156.9 mL. Echogenicity within normal limits. No mass or hydronephrosis visualized. Bladder: Bladder is poorly seen. Other: Diffuse ascites. IMPRESSION: No collecting system dilatation.  Diffuse ascites. Electronically Signed   By: Karen Kays M.D.   On: 05/05/2023 13:52   US Paracentesis  Result Date: 04/30/2023 INDICATION: Patient with history of ETOH cirrhosis, recurrent ascites. Request for diagnostic and therapeutic paracentesis with 5.0 L limit. EXAM: ULTRASOUND GUIDED DIAGNOSTIC AND THERAPEUTIC PARACENTESIS MEDICATIONS: 7 mL 1% lidocaine COMPLICATIONS: None immediate. PROCEDURE: Informed written consent was obtained from the patient after a discussion of the risks, benefits and alternatives to treatment. A timeout was performed prior to the initiation of the procedure. Initial ultrasound scanning demonstrates a large amount of ascites within the right lower abdominal quadrant. The right lower abdomen was prepped and draped in the usual sterile fashion. 1%  lidocaine was used for local anesthesia. Following this, a 19 gauge, 7-cm, Yueh catheter was introduced. An ultrasound image was saved for  documentation purposes. The paracentesis was performed. The catheter was removed and a dressing was applied. The patient tolerated the procedure well without immediate post procedural complication. FINDINGS: A total of approximately 3.6 L of serosanguineous fluid was removed. Samples were sent to the laboratory as requested by the clinical team. IMPRESSION: Successful ultrasound-guided paracentesis yielding 3.6 liters of peritoneal fluid. Performed by Lynnette Caffey, PA-C Electronically Signed   By: Olive Bass M.D.   On: 04/30/2023 15:25   US Paracentesis  Result Date: 04/27/2023 INDICATION: History of cirrhosis with recurrent ascites. Patient presents today for diagnostic and therapeutic paracentesis with 5 L maximum. EXAM: ULTRASOUND GUIDED diagnostic and therapeutic PARACENTESIS MEDICATIONS: None. COMPLICATIONS: None immediate. PROCEDURE: Informed written consent was obtained from the patient after a discussion of the risks, benefits and alternatives to treatment. A timeout was performed prior to the initiation of the procedure. Initial ultrasound scanning demonstrates a large amount of ascites within the right lower abdominal quadrant. The right lower abdomen was prepped and draped in the usual sterile fashion. 1% lidocaine was used for local anesthesia. Following this, a 19 gauge, 7-cm, Yueh catheter was introduced. An ultrasound image was saved for documentation purposes. The paracentesis was performed. The catheter was removed and a dressing was applied. The patient tolerated the procedure well without immediate post procedural complication. Patient received post-procedure intravenous albumin; see nursing notes for details. FINDINGS: A total of approximately 5 L of clear yellow fluid was removed. Samples were sent to the laboratory as requested by the clinical team. IMPRESSION: Successful ultrasound-guided paracentesis yielding 5 liters of peritoneal fluid. PLAN: The patient has previously been  formally evaluated by the [facility] Interventional Radiology Portal Hypertension Clinic and is being actively followed for potential future intervention. TIPS consultation completed 03/18/2023 by Dr. Grace Isaac. TIPS procedure pending echocardiogram. Procedure performed by Mina Marble, PA-C Electronically Signed   By: Irish Lack M.D.   On: 04/27/2023 14:28   US Paracentesis  Result Date: 04/23/2023 INDICATION: ascites Briefly, 42 year old female with a history of EtOH cirrhosis and refractory ascites. TIPS consultation completed 03/18/2023, pending procedure. EXAM: ULTRASOUND GUIDED DIAGNOSTIC AND THERAPEUTIC PARACENTESIS MEDICATIONS: 10 mL lidocaine 1% COMPLICATIONS: None immediate. PROCEDURE: Informed written consent was obtained from the patient after a discussion of the risks, benefits and alternatives to treatment. A timeout was performed prior to the initiation of the procedure. Initial ultrasound scanning demonstrates a large amount of ascites within the right lower abdominal quadrant. The right lower abdomen was prepped and draped in the usual sterile fashion. 1% lidocaine was used for local anesthesia. Following this, a 6 Fr Safe-T-Centesis catheter was introduced. An ultrasound image was saved for documentation purposes. The paracentesis was performed. The catheter was removed and a dressing was applied. The patient tolerated the procedure well without immediate post procedural complication. Patient received post-procedure intravenous albumin; see nursing notes for details. FINDINGS: A total of approximately 5 L of serous peritoneal fluid was removed. Samples were sent to the laboratory as requested by the clinical team. IMPRESSION: Successful ultrasound-guided paracentesis yielding 5 L of peritoneal fluid. PLAN: The patient has previously been formally evaluated by the [facility] Interventional Radiology Portal Hypertension [facility] and is being actively followed for potential future intervention.  TIPS consultation completed 03/18/2023 by Dr. Grace Isaac. Procedure pending echocardiogram. Roanna Banning, MD [facility] Radiology Electronically Signed   By: Roanna Banning  M.D.   On: 04/23/2023 11:46   US Paracentesis  Result Date: 04/16/2023 INDICATION: ascites Briefly, 42 year old female with a history of EtOH cirrhosis with refractory ascites. TIPS consultation completed 03/18/2023, pending procedure EXAM: ULTRASOUND GUIDED DIAGNOSTIC and THERAPEUTIC PARACENTESIS MEDICATIONS: 10 mL lidocaine 1% COMPLICATIONS: None immediate. PROCEDURE: Informed written consent was obtained from the patient after a discussion of the risks, benefits and alternatives to treatment. A timeout was performed prior to the initiation of the procedure. Initial ultrasound scanning demonstrates a large amount of ascites within the right lower abdominal quadrant. The right lower abdomen was prepped and draped in the usual sterile fashion. 1% lidocaine was used for local anesthesia. Following this, a 19 gauge, 7-cm, Yueh catheter was introduced. An ultrasound image was saved for documentation purposes. The paracentesis was performed. The catheter was removed and a dressing was applied. The patient tolerated the procedure well without immediate post procedural complication. Patient received post-procedure intravenous albumin; see nursing notes for details. FINDINGS: A total of approximately 5 L of serous peritoneal fluid was removed. Samples were sent to the laboratory as requested by the clinical team. IMPRESSION: Successful ultrasound-guided paracentesis yielding 5 L of peritoneal fluid. PLAN: The patient has previously been formally evaluated by the [facility] Interventional Radiology Portal Hypertension Clinic and is being actively followed for potential future intervention. TIPS consultation completed 03/18/2023 by Dr. Grace Isaac. Procedure pending echocardiogram. Roanna Banning, MD [facility] Radiology Electronically Signed   By: Roanna Banning M.D.   On: 04/16/2023 10:17   US Paracentesis  Result Date: 04/13/2023 INDICATION: 42 y.o. Female. History of cirrhosis with recurrent ascites. Patient presents for therapeutic and diagnotic paracentesis. Maximum 5 Liters. EXAM: ULTRASOUND GUIDED THERAPEUTIC AND DIAGNOSTIC PARACENTESIS MEDICATIONS: Lidocaine 1% - 10 ml's COMPLICATIONS: None immediate. PROCEDURE: Informed written consent was obtained from the patient after a discussion of the risks, benefits and alternatives to treatment. A timeout was performed prior to the initiation of the procedure. Initial ultrasound scanning demonstrates a large amount of ascites within the right lower abdominal quadrant. The right lower abdomen was prepped and draped in the usual sterile fashion. 1% lidocaine was used for local anesthesia. Following this, a 19 gauge, 7-cm, Yueh catheter was introduced. An ultrasound image was saved for documentation purposes. The paracentesis was performed. The catheter was removed and a dressing was applied. The patient tolerated the procedure well without immediate post procedural complication. Patient received post-procedure intravenous albumin; see nursing notes for details. FINDINGS: A total of approximately 5 L of pale yellow fluid was removed. Samples were sent to the laboratory as requested by the clinical team. IMPRESSION: Successful ultrasound-guided therapeutic and diagnostic paracentesis yielding 5 liters of peritoneal fluid. Performed by: Anders Grant NP PLAN: The patient has previously been evaluated by the [facility] Interventional Radiology Portal Hypertension Clinic, and deemed not a candidate for intervention. See note from IR Attending Dr. Odis Luster. Patient scheduled for ECHO on 6.28.24 Electronically Signed   By: Lupita Raider M.D.   On: 04/13/2023 12:43    Microbiology: Results for orders placed or performed  during the hospital encounter of 05/04/23  MRSA Next Gen by PCR, Nasal     Status: None   Collection Time: 05/04/23  4:15 PM   Specimen: Nasal Mucosa; Nasal Swab  Result Value Ref Range Status   MRSA by PCR Next Gen NOT DETECTED NOT DETECTED Final    Comment: (NOTE) The GeneXpert MRSA Assay (FDA approved for NASAL specimens only), is one component of  a comprehensive MRSA colonization surveillance program. It is not intended to diagnose MRSA infection nor to guide or monitor treatment for MRSA infections. Test performance is not FDA approved in patients less than 40 years old. Performed at [facility], [address] [place], [place] [address]   Gram stain     Status: None   Collection Time: 05/07/23  4:10 PM   Specimen: PATH Cytology Peritoneal fluid  Result Value Ref Range Status   Specimen Description ASCITIC  Final   Special Requests NONE  Final   Gram Stain   Final    NO ORGANISMS SEEN WBC PRESENT,BOTH PMN AND MONONUCLEAR CYTOSPIN SMEAR Performed at [facility], [address], [place], [place] [address]    Report Status 05/07/2023 FINAL  Final  Culture, body fluid w Gram Stain-bottle     Status: None (Preliminary result)   Collection Time: 05/07/23  4:23 PM   Specimen: Ascitic  Result Value Ref Range Status   Specimen Description ASCITIC  Final   Special Requests 10CC  Final   Culture   Final    NO GROWTH 3 DAYS Performed at [facility] [address], [place], [place] [address]    Report Status PENDING  Incomplete    Labs: CBC: Recent Labs  Lab 05/04/23 0837 05/04/23 1217 05/06/23 0410 05/07/23 0438 05/08/23 0453 05/09/23 0932 05/10/23 0349  WBC 12.4*   < > 10.5 9.6 8.9 9.6 9.9  NEUTROABS 8.9*  --   --   --   --   --   --   HGB 6.3*   < > 7.9* 7.4* 7.2* 7.3* 7.2*  HCT 18.2*   < > 23.5* 21.2* 20.1* 20.4* 20.8*  MCV 85.0   < > 87.7 85.8 84.5 84.0 86.3  PLT 247   < > 178 167 163 159 165   < > = values in this interval not displayed.   Basic Metabolic  Panel: Recent Labs  Lab 05/05/23 0515 05/06/23 0410 05/06/23 0935 05/07/23 0438 05/08/23 0453 05/09/23 0406 05/10/23 0349  NA 128* 128* 129* 129* 135 138 132*  K 2.7* 3.4* 3.2* 3.1* 3.3* 3.1* 3.1*  CL 101 102 100 102 107 107 105  CO2 12* 12* 12* 13* 13* 13* 14*  GLUCOSE 111* 130* 114* 102* 110* 139* 118*  BUN 43* 43* 42* 44* 47* 52* 51*  CREATININE 4.59* 4.42* 4.64* 5.65* 6.68* 7.36* 7.38*  CALCIUM 8.2* 8.3* 9.0 9.3 10.2 11.2* 11.0*  MG 2.0  --   --   --   --   --   --   PHOS  --  5.1* 5.1*  --   --  5.9*  --    Liver Function Tests: Recent Labs  Lab 05/05/23 0515 05/06/23 0410 05/07/23 0438 05/08/23 0453 05/09/23 0406 05/09/23 1219 05/10/23 0349  AST 29  --  16 14*  --  13* 14*  ALT 17  --  11 9  --  9 9  ALKPHOS 86  --  54 47  --  45 58  BILITOT 4.5*  --  2.2* 2.6*  --  2.4* 1.7*  PROT 7.2  --  6.0* 6.3*  --  6.8 6.3*  ALBUMIN 4.7   < > 4.5 5.2* 5.3* 5.3* 4.9   < > = values in this interval not displayed.   CBG: No results for input(s): "GLUCAP" in the last 168 hours.  Discharge time spent: greater than 30 minutes.  Signed: Vassie Loll, MD [facility] 05/10/2023

## 2023-05-10 NOTE — Progress Notes (Signed)
Patient ID: [name], female   DOB: 21-Mar-1981, 42 y.o.   MRN: [id number] S: No new complaints.  Was started on levophed over the weekend due to worsening hypotension. O:BP 107/74   Pulse 97   Temp 98.8 F (37.1 C) (Oral)   Resp 18   Ht 5\' 4"  (1.626 m)   Wt 53.1 kg   LMP 04/26/2023 (Approximate)   SpO2 100%   BMI 20.09 kg/m   Intake/Output Summary (Last 24 hours) at 05/10/2023 0913 Last data filed at 05/09/2023 1852 Gross per 24 hour  Intake 863.57 ml  Output 300 ml  Net 563.57 ml   Intake/Output: I/O last 3 completed shifts: In: 1946.7 [P.O.:630; I.V.:328.1; IV Piggyback:988.6] Out: 300 [Emesis/NG output:300]  Intake/Output this shift:  No intake/output data recorded. Weight change: 1.7 kg Gen: NAD CVS: RRR Resp:CTA Abd: +BS, distended, + fluid wave, nontender Ext: no edema  Recent Labs  Lab 05/04/23 0837 05/04/23 1217 05/05/23 0515 05/06/23 0410 05/06/23 0935 05/07/23 0438 05/08/23 0453 05/09/23 0406 05/09/23 1219 05/10/23 0349  NA 129* 125* 128* 128* 129* 129* 135 138  --  132*  K 3.1* 3.1* 2.7* 3.4* 3.2* 3.1* 3.3* 3.1*  --  3.1*  CL 105 102 101 102 100 102 107 107  --  105  CO2 11* 10* 12* 12* 12* 13* 13* 13*  --  14*  GLUCOSE 113* 129* 111* 130* 114* 102* 110* 139*  --  118*  BUN 42* 43* 43* 43* 42* 44* 47* 52*  --  51*  CREATININE 5.20* 5.17* 4.59* 4.42* 4.64* 5.65* 6.68* 7.36*  --  7.38*  ALBUMIN 2.7* 2.7* 4.7 4.7 6.0* 4.5 5.2* 5.3* 5.3* 4.9  CALCIUM 7.8* 7.5* 8.2* 8.3* 9.0 9.3 10.2 11.2*  --  11.0*  PHOS  --   --   --  5.1* 5.1*  --   --  5.9*  --   --   AST 47* 44* 29  --   --  16 14*  --  13* 14*  ALT 26 25 17   --   --  11 9  --  9 9   Liver Function Tests: Recent Labs  Lab 05/08/23 0453 05/09/23 0406 05/09/23 1219 05/10/23 0349  AST 14*  --  13* 14*  ALT 9  --  9 9  ALKPHOS 47  --  45 58  BILITOT 2.6*  --  2.4* 1.7*  PROT 6.3*  --  6.8 6.3*  ALBUMIN 5.2* 5.3* 5.3* 4.9   Recent Labs  Lab 05/04/23 1217  LIPASE 51   Recent Labs   Lab 05/04/23 1217 05/07/23 1052 05/09/23 0406  AMMONIA 76* 153* 83*   CBC: Recent Labs  Lab 05/04/23 0837 05/04/23 1217 05/06/23 0410 05/07/23 0438 05/08/23 0453 05/09/23 0932 05/10/23 0349  WBC 12.4*   < > 10.5 9.6 8.9 9.6 9.9  NEUTROABS 8.9*  --   --   --   --   --   --   HGB 6.3*   < > 7.9* 7.4* 7.2* 7.3* 7.2*  HCT 18.2*   < > 23.5* 21.2* 20.1* 20.4* 20.8*  MCV 85.0   < > 87.7 85.8 84.5 84.0 86.3  PLT 247   < > 178 167 163 159 165   < > = values in this interval not displayed.   Cardiac Enzymes: No results for input(s): "CKTOTAL", "CKMB", "CKMBINDEX", "TROPONINI" in the last 168 hours. CBG: No results for input(s): "GLUCAP" in the last 168 hours.  Iron  Studies: No results for input(s): "IRON", "TIBC", "TRANSFERRIN", "FERRITIN" in the last 72 hours. Studies/Results: DG CHEST PORT 1 VIEW  Result Date: 05/08/2023 CLINICAL DATA:  Central line placement. EXAM: PORTABLE CHEST 1 VIEW COMPARISON:  May 08, 2023 FINDINGS: Since the prior study there is been interval right internal jugular venous catheter placement. Its distal tip sits approximally 2.2 cm distal to the junction of the superior vena cava and right atrium. The heart size and mediastinal contours are within normal limits. Low lung volumes are noted. Mild atelectasis is seen within the left lung base. There is no evidence of focal consolidation, pleural effusion or pneumothorax. The visualized skeletal structures are unremarkable. IMPRESSION: 1. Right internal jugular venous catheter positioning, as described above. 2. Low lung volumes with mild left basilar atelectasis. Electronically Signed   By: Aram Candela M.D.   On: 05/08/2023 18:45   DG CHEST PORT 1 VIEW  Result Date: 05/08/2023 CLINICAL DATA:  Shortness of breath EXAM: PORTABLE CHEST 1 VIEW COMPARISON:  None Available. FINDINGS: Cardiac shadow is within normal limits. Lungs are well aerated bilaterally. No focal infiltrate or effusion is seen. No bony  abnormality is noted. IMPRESSION: No active disease. Electronically Signed   By: Alcide Clever M.D.   On: 05/08/2023 11:42    sodium chloride   Intravenous Once   sodium chloride   Intravenous Once   Chlorhexidine Gluconate Cloth  6 each Topical Daily   feeding supplement  237 mL Oral BID BM   folic acid  1 mg Oral Daily   lactulose  30 g Oral TID   melatonin  3 mg Oral QHS   midodrine  15 mg Oral TID WC   multivitamin with minerals  1 tablet Oral Daily   nicotine  14 mg Transdermal Daily   octreotide  200 mcg Subcutaneous TID   pantoprazole  40 mg Oral BID   rifaximin  550 mg Oral BID   sodium bicarbonate  1,300 mg Oral TID   ursodiol  300 mg Oral TID   zinc sulfate  220 mg Oral Daily    BMET    Component Value Date/Time   NA 132 (L) 05/10/2023 0349   K 3.1 (L) 05/10/2023 0349   CL 105 05/10/2023 0349   CO2 14 (L) 05/10/2023 0349   GLUCOSE 118 (H) 05/10/2023 0349   BUN 51 (H) 05/10/2023 0349   CREATININE 7.38 (H) 05/10/2023 0349   CREATININE 0.59 08/12/2020 1602   CALCIUM 11.0 (H) 05/10/2023 0349   GFRNONAA 7 (L) 05/10/2023 0349   GFRNONAA 115 08/12/2020 1602   GFRAA 134 08/12/2020 1602   CBC    Component Value Date/Time   WBC 9.9 05/10/2023 0349   RBC 2.41 (L) 05/10/2023 0349   HGB 7.2 (L) 05/10/2023 0349   HCT 20.8 (L) 05/10/2023 0349   PLT 165 05/10/2023 0349   MCV 86.3 05/10/2023 0349   MCH 29.9 05/10/2023 0349   MCHC 34.6 05/10/2023 0349   RDW 15.5 05/10/2023 0349   LYMPHSABS 2.0 05/04/2023 0837   MONOABS 1.2 (H) 05/04/2023 0837   EOSABS 0.2 05/04/2023 0837   BASOSABS 0.0 05/04/2023 0837     Assessment/Plan: AKI -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  Scr appears to have reached a plateau.  No urgent indication for dialysis at this time.  Will continue to follow.  if needed HD would likely need CRRT given low BP-  usually dialysis is not offered in these cases if not a bridge to transplant as prognosis is poor.  Continue withy IV albumin.     Acute on chronic anemia -transfuse prn for Hgb <7 -GI following, open for EGD, c-scope Will add ESA   Decompensated cirrhosis -GI following -if paracentesis is to be performed then would recommend not removing more than 4L and would recommend giving albumin with para's I calcultate MELD at 33 giving 3 month mortality at over 50%-  has not established with transplant-  Consider contacting transplant center vs Palliative care consult.   Hypotension -on midodrine 10mg  TID & octreotide. Increased midodrine to 15mg  tid given MAPs are not at goal and needed to start levophed over the weekend.   Hypokalemia -replete PRN-  give 40 today    Hyponatremia -acute on chronic in the context of AKI and cirrhosis. Na 129 today. Would recommend fluids restriction of 1.5L/day for now   Metabolic acidosis -likely multifactorial: AKI and decreased lactate clearance from cirrhosis -currently on bicarb  1300mg  TID  Hypercalcemia - possibly due to immobility.  Will check vit D levels and iPTH.  Irena Cords, MD [facility]

## 2023-05-10 NOTE — Progress Notes (Addendum)
I have reached out to [employer] regarding patient's updated mental status. Awaiting call back.   [phone].  Received call back this afternoon from Dr. Ardelle Lesches. Transplant Hepatology. She will be discussing with the MICU team, as hospitalist will need to accept patient. From there, Transplant Hepatology could be consulted once at [facility].   I spoke with hospitalist, Dr. Hillery Jacks. He has accepted patient. Bed will be 8 West, Bed 19. [facility] line will be contacting nursing staff to get process started.   Gelene Mink, PhD, ANP-BC [facility]

## 2023-05-10 NOTE — Progress Notes (Signed)
Gastroenterology Progress Note    Primary Care Physician:  Benetta Spar, MD Primary Gastroenterologist:  Dr. Marletta Lor   Patient ID: Mallory Mendoza; [id number]; [birth year]    Subjective   Denies abdominal pain. BM this morning. No overt GI bleeding. Poor appetite. Intermittent nausea. She is back to mental status baseline. No confusion. Fully oriented X 4 and able to discuss prognosis. She is aware of severity of her illness. She is not sure if she wants to be considered for transplant, stating she is fearful. Agreeable to talk with Chaplain and for [place] to reach out to [facility] again. Mother and son at bedside.    Objective   Vital signs in last 24 hours Temp:  [98.2 F (36.8 C)-98.8 F (37.1 C)] 98.8 F (37.1 C) (06/24 0718) Pulse Rate:  [69-102] 97 (06/24 0800) Resp:  [10-24] 18 (06/24 0800) BP: (87-139)/(51-87) 107/74 (06/24 0800) SpO2:  [95 %-100 %] 100 % (06/24 0800) Weight:  [53.1 kg] 53.1 kg (06/24 0432) Last BM Date : 05/10/23  Physical Exam General:   Alert and oriented, pleasant, chronically ill-appearing Head:  Normocephalic and atraumatic. Abdomen:  moderately distended but non-tense, no TTP, Bowel sounds present Msk:  Symmetrical without gross deformities. Normal posture. Extremities:  Without edema. Neurologic:  Alert and  oriented x4   Intake/Output from previous day: 06/23 0701 - 06/24 0700 In: 972.8 [P.O.:390; I.V.:182.8; IV Piggyback:400] Out: 300 [Emesis/NG output:300] Intake/Output this shift: No intake/output data recorded.  Lab Results  Recent Labs    05/08/23 0453 05/09/23 0932 05/10/23 0349  WBC 8.9 9.6 9.9  HGB 7.2* 7.3* 7.2*  HCT 20.1* 20.4* 20.8*  PLT 163 159 165   BMET Recent Labs    05/08/23 0453 05/09/23 0406 05/10/23 0349  NA 135 138 132*  K 3.3* 3.1* 3.1*  CL 107 107 105  CO2 13* 13* 14*  GLUCOSE 110* 139* 118*  BUN 47* 52* 51*  CREATININE 6.68* 7.36* 7.38*  CALCIUM 10.2 11.2* 11.0*   LFT Recent Labs     05/08/23 0453 05/09/23 0406 05/09/23 1219 05/10/23 0349  PROT 6.3*  --  6.8 6.3*  ALBUMIN 5.2* 5.3* 5.3* 4.9  AST 14*  --  13* 14*  ALT 9  --  9 9  ALKPHOS 47  --  45 58  BILITOT 2.6*  --  2.4* 1.7*  BILIDIR  --   --  1.0*  --   IBILI  --   --  1.4*  --    PT/INR Recent Labs    05/08/23 0453 05/10/23 0349  LABPROT 24.7* 24.4*  INR 2.2* 2.2*     Studies/Results DG CHEST PORT 1 VIEW  Result Date: 05/08/2023 CLINICAL DATA:  Central line placement. EXAM: PORTABLE CHEST 1 VIEW COMPARISON:  May 08, 2023 FINDINGS: Since the prior study there is been interval right internal jugular venous catheter placement. Its distal tip sits approximally 2.2 cm distal to the junction of the superior vena cava and right atrium. The heart size and mediastinal contours are within normal limits. Low lung volumes are noted. Mild atelectasis is seen within the left lung base. There is no evidence of focal consolidation, pleural effusion or pneumothorax. The visualized skeletal structures are unremarkable. IMPRESSION: 1. Right internal jugular venous catheter positioning, as described above. 2. Low lung volumes with mild left basilar atelectasis. Electronically Signed   By: Aram Candela M.D.   On: 05/08/2023 18:45   DG CHEST PORT 1 VIEW  Result Date: 05/08/2023 CLINICAL DATA:  Shortness of breath EXAM: PORTABLE CHEST 1 VIEW COMPARISON:  None Available. FINDINGS: Cardiac shadow is within normal limits. Lungs are well aerated bilaterally. No focal infiltrate or effusion is seen. No bony abnormality is noted. IMPRESSION: No active disease. Electronically Signed   By: Alcide Clever M.D.   On: 05/08/2023 11:42   US Paracentesis  Result Date: 05/07/2023 INDICATION: 201095 SBP (spontaneous bacterial peritonitis) (HCC) 201095 EXAM: ULTRASOUND GUIDED DIAGNOSTIC and THERAPEUTIC PARACENTESIS MEDICATIONS: 10 mL lidocaine 2 % COMPLICATIONS: None immediate. PROCEDURE: Informed written consent was obtained from the  patient after a discussion of the risks, benefits and alternatives to treatment. A timeout was performed prior to the initiation of the procedure. Initial ultrasound scanning demonstrates a large amount of ascites within the right lower abdominal quadrant. The right lower abdomen was prepped and draped in the usual sterile fashion. 1% lidocaine was used for local anesthesia. Following this, a 6 Fr Safe-T-Centesis catheter was introduced. An ultrasound image was saved for documentation purposes. The paracentesis was performed. The catheter was removed and a dressing was applied. The patient tolerated the procedure well without immediate post procedural complication. FINDINGS: A total of approximately 4.2 L of bilious-tinged, serous peritoneal fluid was removed. Samples were sent to the laboratory as requested by the clinical team. IMPRESSION: Successful diagnostic and therapeutic paracentesis yielding 4.2 L liters of peritoneal fluid. PLAN: The patient has previously been formally evaluated by the [facility] Rad[facility] and is being actively followed for potential future intervention. Roanna Banning, MD [facility] Electronically Signed   By: Roanna Banning M.D.   On: 05/07/2023 16:39   CT ABDOMEN PELVIS WO CONTRAST  Result Date: 05/06/2023 CLINICAL DATA:  Abdominal pain and distention. EXAM: CT ABDOMEN AND PELVIS WITHOUT CONTRAST TECHNIQUE: Multidetector CT imaging of the abdomen and pelvis was performed following the standard protocol without IV contrast. RADIATION DOSE REDUCTION: This exam was performed according to the departmental dose-optimization program which includes automated exposure control, adjustment of the mA and/or kV according to patient size and/or use of iterative reconstruction technique. COMPARISON:  December 20, 2022. FINDINGS: Lower chest: Small left pleural effusion is noted. Hepatobiliary: No  cholelithiasis or biliary dilatation is noted. Slightly nodular hepatic margins are noted suggesting hepatic cirrhosis. Pancreas: Unremarkable. No pancreatic ductal dilatation or surrounding inflammatory changes. Spleen: Normal in size without focal abnormality. Adrenals/Urinary Tract: Adrenal glands are unremarkable. Kidneys are normal, without renal calculi, focal lesion, or hydronephrosis. Bladder is unremarkable. Stomach/Bowel: Stomach is within normal limits. Appendix appears normal. No evidence of bowel wall thickening, distention, or inflammatory changes. Vascular/Lymphatic: Aortic atherosclerosis. No enlarged abdominal or pelvic lymph nodes. Reproductive: Uterus and bilateral adnexa are unremarkable. Other: Moderate to severe ascites is noted.  No hernia is noted. Musculoskeletal: No acute or significant osseous findings. IMPRESSION: Moderate to severe ascites is noted secondary to hepatic cirrhosis. Small left pleural effusion is noted. Aortic Atherosclerosis (ICD10-I70.0). Electronically Signed   By: Lupita Raider M.D.   On: 05/06/2023 18:08   ECHOCARDIOGRAM COMPLETE  Result Date: 05/06/2023    ECHOCARDIOGRAM REPORT   Patient Name:   ANTOINETTA BERRONES Date of Exam: 05/06/2023 Medical Rec #:  [id number]       Height:       64.0 in Accession #:    [id number]      Weight:       117.3 lb Date of Birth:  1980-11-29       BSA:  1.559 m Patient Age:    41 years        BP:           91/49 mmHg Patient Gender: F               HR:           94 bpm. [facility] Procedure: 2D Echo, Cardiac Doppler and Color Doppler Indications:    Other abnormalities of the heart R00.8  History:        Patient has prior history of Echocardiogram examinations, most                 recent 12/21/2022. Alcoholic cirrhosis of liver with ascites                 (HCC), Tobacco use disorder.  Sonographer:    Celesta Gentile RCS Referring Phys: [phone] CARLOS MADERA IMPRESSIONS  1. Left ventricular ejection fraction, by  estimation, is 70 to 75%. The left ventricle has hyperdynamic function. The left ventricle has no regional wall motion abnormalities. There is mild left ventricular hypertrophy. Left ventricular diastolic parameters were normal.  2. Right ventricular systolic function is normal. The right ventricular size is normal. Tricuspid regurgitation signal is inadequate for assessing PA pressure.  3. The mitral valve is grossly normal. Trivial mitral valve regurgitation.  4. The aortic valve is tricuspid. Aortic valve regurgitation is not visualized. No aortic stenosis is present. Aortic valve mean gradient measures 8.0 mmHg.  5. The inferior vena cava is normal in size with greater than 50% respiratory variability, suggesting right atrial pressure of 3 mmHg. Comparison(s): Prior images reviewed side by side. LVEF vigorous at 70-75%. FINDINGS  Left Ventricle: Left ventricular ejection fraction, by estimation, is 70 to 75%. The left ventricle has hyperdynamic function. The left ventricle has no regional wall motion abnormalities. The left ventricular internal cavity size was normal in size. There is mild left ventricular hypertrophy. Left ventricular diastolic parameters were normal. Right Ventricle: The right ventricular size is normal. No increase in right ventricular wall thickness. Right ventricular systolic function is normal. Tricuspid regurgitation signal is inadequate for assessing PA pressure. Left Atrium: Left atrial size was normal in size. Right Atrium: Right atrial size was normal in size. Pericardium: There is no evidence of pericardial effusion. Mitral Valve: The mitral valve is grossly normal. Trivial mitral valve regurgitation. Tricuspid Valve: The tricuspid valve is grossly normal. Tricuspid valve regurgitation is mild. Aortic Valve: The aortic valve is tricuspid. Aortic valve regurgitation is not visualized. No aortic stenosis is present. Aortic valve mean gradient measures 8.0 mmHg. Aortic valve peak  gradient measures 18.0 mmHg. Aortic valve area, by VTI measures 2.22  cm. Pulmonic Valve: The pulmonic valve was grossly normal. Pulmonic valve regurgitation is trivial. Aorta: The aortic root is normal in size and structure. Venous: The inferior vena cava is normal in size with greater than 50% respiratory variability, suggesting right atrial pressure of 3 mmHg. IAS/Shunts: No atrial level shunt detected by color flow Doppler.  LEFT VENTRICLE PLAX 2D LVIDd:         4.20 cm   Diastology LVIDs:         1.80 cm   LV e' medial:    8.70 cm/s LV PW:         1.10 cm   LV E/e' medial:  12.9 LV IVS:        1.00 cm   LV e' lateral:   11.40 cm/s LVOT  diam:     2.00 cm   LV E/e' lateral: 9.8 LV SV:         77 LV SV Index:   49 LVOT Area:     3.14 cm  RIGHT VENTRICLE RV S prime:     16.50 cm/s TAPSE (M-mode): 2.1 cm LEFT ATRIUM             Index        RIGHT ATRIUM           Index LA diam:        3.60 cm 2.31 cm/m   RA Area:     12.40 cm LA Vol (A2C):   38.9 ml 24.95 ml/m  RA Volume:   27.30 ml  17.51 ml/m LA Vol (A4C):   40.9 ml 26.23 ml/m LA Biplane Vol: 43.7 ml 28.02 ml/m  AORTIC VALVE AV Area (Vmax):    2.45 cm AV Area (Vmean):   2.42 cm AV Area (VTI):     2.22 cm AV Vmax:           212.00 cm/s AV Vmean:          120.000 cm/s AV VTI:            0.345 m AV Peak Grad:      18.0 mmHg AV Mean Grad:      8.0 mmHg LVOT Vmax:         165.00 cm/s LVOT Vmean:        92.600 cm/s LVOT VTI:          0.244 m LVOT/AV VTI ratio: 0.71  AORTA Ao Root diam: 3.00 cm MITRAL VALVE MV Area (PHT): 2.17 cm     SHUNTS MV Decel Time: 349 msec     Systemic VTI:  0.24 m MV E velocity: 112.00 cm/s  Systemic Diam: 2.00 cm MV A velocity: 92.20 cm/s MV E/A ratio:  1.21 Nona Dell MD Electronically signed by Nona Dell MD Signature Date/Time: 05/06/2023/3:48:03 PM    Final    DG ESOPHAGUS W SINGLE CM (SOL OR THIN BA)  Result Date: 05/06/2023 CLINICAL DATA:  42 yo female with history of ETOH cirrhosis, recurrent GI bleed, anxiety,  depression and menorrhagia admitted for anemia and AKI. Pt complains of worsening solid food and medication dysphagia. She denies previous esophagus or stomach surgeries. Pt referred for fluoroscopic esophagram study. EXAM: ESOPHAGUS/BARIUM SWALLOW/TABLET STUDY TECHNIQUE: Single contrast examination was performed using water soluble contrast. This exam was performed by Alex Gardener, NP, and was supervised and interpreted by Roanna Banning, MD. FLUOROSCOPY: Radiation Exposure Index (as provided by the fluoroscopic device): 20.20 mGy Kerma COMPARISON:  None Available. FINDINGS: Exam limited due to patients inability to stand or position independently on exam table. Swallowing: Appears normal. No vestibular penetration or aspiration seen. Pharynx: Unremarkable. Esophagus: Normal appearance. Esophageal motility: Within normal limits. Hiatal Hernia: None. Gastroesophageal reflux: Spontaneous trace gastric reflux to distal esophagus without provocation Ingested 13mm barium tablet: Pt states she could not swallow tablet. Other: None. IMPRESSION: Exam limited due to patients inability to stand and position independently on exam table. Trace gastroesophageal reflux to distal esophagus. Otherwise, normal esophagram. Electronically Signed   By: Roanna Banning M.D.   On: 05/06/2023 11:17   US RENAL  Result Date: 05/05/2023 CLINICAL DATA:  Acute kidney injury EXAM: RENAL / URINARY TRACT ULTRASOUND COMPLETE COMPARISON:  Ascites ultrasound 04/30/2019. CT chest abdomen pelvis 12/20/2018 FINDINGS: Right Kidney: Renal measurements: 11.2 x 4.2 x 6.0 cm = volume: 148.4  mL. Echogenicity within normal limits. No mass or hydronephrosis visualized. Left Kidney: Renal measurements: 10.4 x 5.4 x 5.3 cm = volume: 156.9 mL. Echogenicity within normal limits. No mass or hydronephrosis visualized. Bladder: Bladder is poorly seen. Other: Diffuse ascites. IMPRESSION: No collecting system dilatation.  Diffuse ascites. Electronically Signed   By:  Karen Kays M.D.   On: 05/05/2023 13:52      Assessment  42 y.o. female with a history of anxiety, depression, decompensated alcoholic cirrhosis, concern for PBC with positive AMA but no liver biopsy and has had normal alk phos, alcoholic hepatitis, hepatic encephalopathy, recurrent ascites, CKD, admitted to [facility] with worsening anemia but without significant GI bleeding. She has had worsening mental status and AKI on CKD with Nephrology on board.   Cirrhosis:  with encephalopathy. MELD 3.0 today is 33.No obvious signs of infection s/p CXR, para negative for SBP, ascitic fluid culture without growth X 3 days. No overt GI bleeding. Continuing with XIfaxan 550 mg BID, lactulose 30 g TID, zinc 220 mg daily. Nadolol and Lasix on hold. 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 Remains of levophed, currently at .  Appreciate Nephrology following. Mental status much improved and now back to baseline.   Acute on chronic anemia: EGD April 2024 with severe portal gastropathy, friable and oozing. No prior colonoscopy. Hgb remaining stable at 7.2. Received 2 units PRBCs on 6/18 with Hgb low of 5.8. No overt GI bleeding. Heme negative. Colonoscopy has been recommended as outpatient.   HCG positive at low titer: highly doubt pregnancy. Recommend GYN evaluation.   We had a long, frank discussion at bedside now that she is mentally back to baseline. Mother and son were both present and highly supportive. Patient notes fear regarding discussion of liver transplant candidacy. She would appreciate discussion with Chaplain, and I have placed consult for this. I will also reach out to [facility] to update them regarding patient's mental status.      Plan / Recommendations  Reaching out to [facility] today to update regarding mental status Appreciate Nephrology following along Continue octreotide and midodrine Continues on levophed Daily MELD labs Xifaxan BID, lactulose TID with goal  of 2-3 BMs daily Zinc 220 mg daily 2 gram sodium diet Supportive measures Recommend GYN evaluation due to mildly elevated HCG PPI BID Transfuse as needed If further paras needed, recommend no more than 4 liters and would give IV albumin at start.  I have consulted chaplain for spiritual care Recommend palliative evaluation   LOS: 6 days    05/10/2023, 8:56 AM  Gelene Mink, PhD, ANP-BC [facility] Gastroenterology

## 2023-05-11 LAB — CYTOLOGY - NON PAP

## 2023-05-11 LAB — PARATHYROID HORMONE, INTACT (NO CA): PTH: 9 pg/mL — ABNORMAL LOW (ref 15–65)

## 2023-05-12 ENCOUNTER — Telehealth: Payer: Self-pay | Admitting: Licensed Clinical Social Worker

## 2023-05-12 LAB — CULTURE, BODY FLUID W GRAM STAIN -BOTTLE: Culture: NO GROWTH

## 2023-05-12 NOTE — Transitions of Care (Post Inpatient/ED Visit) (Signed)
   05/12/2023  Name: Mallory Mendoza MRN: [id number] DOB: 1981/05/01  Today's TOC FU Call Status: Today's TOC FU Call Status:: Unsuccessul Call (1st Attempt) Unsuccessful Call (1st Attempt) Date: 05/12/23  Transition Care Management Follow-up Telephone Call Date of Discharge:  (Unknown) Discharge Facility: Other (Non-[facility]) Name of Other ([facility]) Discharge Facility: [facility]  [facility] LCSW unable to reach patient successfully. Voice message left inquiring about her health recent needs due to recent hospital admission.   Dickie La, BSW, MSW, [employer] Medicaid LCSW [facility]  [facility] [email or web address] Phone: [phone]

## 2023-05-14 ENCOUNTER — Ambulatory Visit (HOSPITAL_COMMUNITY): Admission: RE | Admit: 2023-05-14 | Payer: Medicaid Other | Source: Ambulatory Visit

## 2023-05-16 LAB — 25-HYDROXY VITAMIN D LCMS D2+D3
25-Hydroxy, Vitamin D-2: <1 ng/mL
25-Hydroxy, Vitamin D-3: 9.1 ng/mL
25-Hydroxy, Vitamin D: 9.1 ng/mL — ABNORMAL LOW

## 2023-05-17 ENCOUNTER — Encounter: Payer: Self-pay | Admitting: Gastroenterology

## 2023-05-17 ENCOUNTER — Ambulatory Visit: Payer: Medicaid Other | Admitting: Gastroenterology

## 2023-06-17 DEATH — deceased
